# Patient Record
Sex: Female | Born: 1965 | Race: White | Hispanic: No | Smoking: Never smoker
Health system: Southern US, Community
[De-identification: ages and names within clinical notes are randomized; demographics above are authoritative.]

## PROBLEM LIST (undated history)

## (undated) DIAGNOSIS — E785 Hyperlipidemia, unspecified: Secondary | ICD-10-CM

## (undated) DIAGNOSIS — M25471 Effusion, right ankle: Secondary | ICD-10-CM

## (undated) DIAGNOSIS — K589 Irritable bowel syndrome without diarrhea: Secondary | ICD-10-CM

## (undated) DIAGNOSIS — F32A Depression, unspecified: Secondary | ICD-10-CM

## (undated) DIAGNOSIS — F988 Other specified behavioral and emotional disorders with onset usually occurring in childhood and adolescence: Secondary | ICD-10-CM

## (undated) DIAGNOSIS — M1990 Unspecified osteoarthritis, unspecified site: Secondary | ICD-10-CM

## (undated) DIAGNOSIS — T7840XA Allergy, unspecified, initial encounter: Secondary | ICD-10-CM

## (undated) DIAGNOSIS — K439 Ventral hernia without obstruction or gangrene: Secondary | ICD-10-CM

## (undated) DIAGNOSIS — F329 Major depressive disorder, single episode, unspecified: Secondary | ICD-10-CM

## (undated) DIAGNOSIS — R202 Paresthesia of skin: Secondary | ICD-10-CM

## (undated) DIAGNOSIS — M25472 Effusion, left ankle: Secondary | ICD-10-CM

## (undated) DIAGNOSIS — R200 Anesthesia of skin: Secondary | ICD-10-CM

## (undated) DIAGNOSIS — G479 Sleep disorder, unspecified: Secondary | ICD-10-CM

## (undated) DIAGNOSIS — E079 Disorder of thyroid, unspecified: Secondary | ICD-10-CM

## (undated) DIAGNOSIS — I10 Essential (primary) hypertension: Secondary | ICD-10-CM

## (undated) DIAGNOSIS — E669 Obesity, unspecified: Secondary | ICD-10-CM

## (undated) HISTORY — DX: Hyperlipidemia, unspecified: E78.5

## (undated) HISTORY — DX: Other specified behavioral and emotional disorders with onset usually occurring in childhood and adolescence: F98.8

## (undated) HISTORY — DX: Obesity, unspecified: E66.9

## (undated) HISTORY — PX: WISDOM TOOTH EXTRACTION: SHX21

## (undated) HISTORY — DX: Allergy, unspecified, initial encounter: T78.40XA

## (undated) HISTORY — DX: Disorder of thyroid, unspecified: E07.9

## (undated) HISTORY — DX: Fibromyalgia: M79.7

## (undated) HISTORY — DX: Ventral hernia without obstruction or gangrene: K43.9

## (undated) HISTORY — DX: Essential (primary) hypertension: I10

## (undated) HISTORY — DX: Gastro-esophageal reflux disease without esophagitis: K21.9

## (undated) HISTORY — PX: TONSILECTOMY/ADENOIDECTOMY WITH MYRINGOTOMY: SHX6125

## (undated) HISTORY — DX: Unspecified osteoarthritis, unspecified site: M19.90

---

## 1998-07-28 ENCOUNTER — Encounter: Payer: Self-pay | Admitting: *Deleted

## 1998-07-28 ENCOUNTER — Ambulatory Visit (HOSPITAL_COMMUNITY): Admission: RE | Admit: 1998-07-28 | Discharge: 1998-07-28 | Payer: Self-pay | Admitting: *Deleted

## 1998-08-07 ENCOUNTER — Encounter: Payer: Self-pay | Admitting: *Deleted

## 1998-08-07 ENCOUNTER — Ambulatory Visit (HOSPITAL_COMMUNITY): Admission: RE | Admit: 1998-08-07 | Discharge: 1998-08-07 | Payer: Self-pay | Admitting: *Deleted

## 1999-02-14 ENCOUNTER — Ambulatory Visit (HOSPITAL_COMMUNITY): Admission: RE | Admit: 1999-02-14 | Discharge: 1999-02-14 | Payer: Self-pay | Admitting: *Deleted

## 1999-02-14 ENCOUNTER — Encounter: Payer: Self-pay | Admitting: *Deleted

## 1999-04-05 ENCOUNTER — Emergency Department (HOSPITAL_COMMUNITY): Admission: EM | Admit: 1999-04-05 | Discharge: 1999-04-05 | Payer: Self-pay | Admitting: Emergency Medicine

## 1999-04-28 ENCOUNTER — Encounter: Payer: Self-pay | Admitting: Internal Medicine

## 1999-04-28 ENCOUNTER — Ambulatory Visit (HOSPITAL_COMMUNITY): Admission: RE | Admit: 1999-04-28 | Discharge: 1999-04-28 | Payer: Self-pay | Admitting: Internal Medicine

## 1999-07-24 ENCOUNTER — Other Ambulatory Visit: Admission: RE | Admit: 1999-07-24 | Discharge: 1999-07-24 | Payer: Self-pay | Admitting: Obstetrics and Gynecology

## 1999-11-13 ENCOUNTER — Ambulatory Visit (HOSPITAL_COMMUNITY): Admission: RE | Admit: 1999-11-13 | Discharge: 1999-11-13 | Payer: Self-pay

## 2000-11-30 ENCOUNTER — Other Ambulatory Visit: Admission: RE | Admit: 2000-11-30 | Discharge: 2000-11-30 | Payer: Self-pay | Admitting: Obstetrics and Gynecology

## 2002-01-18 ENCOUNTER — Other Ambulatory Visit: Admission: RE | Admit: 2002-01-18 | Discharge: 2002-01-18 | Payer: Self-pay | Admitting: Obstetrics and Gynecology

## 2002-02-14 ENCOUNTER — Encounter: Payer: Self-pay | Admitting: Obstetrics and Gynecology

## 2002-02-14 ENCOUNTER — Ambulatory Visit (HOSPITAL_COMMUNITY): Admission: RE | Admit: 2002-02-14 | Discharge: 2002-02-14 | Payer: Self-pay | Admitting: Obstetrics and Gynecology

## 2006-01-14 ENCOUNTER — Ambulatory Visit (HOSPITAL_COMMUNITY): Admission: RE | Admit: 2006-01-14 | Discharge: 2006-01-14 | Payer: Self-pay | Admitting: Family Medicine

## 2006-01-27 ENCOUNTER — Encounter: Admission: RE | Admit: 2006-01-27 | Discharge: 2006-01-27 | Payer: Self-pay | Admitting: Family Medicine

## 2006-02-11 ENCOUNTER — Encounter: Admission: RE | Admit: 2006-02-11 | Discharge: 2006-02-11 | Payer: Self-pay | Admitting: Family Medicine

## 2006-03-04 ENCOUNTER — Encounter: Admission: RE | Admit: 2006-03-04 | Discharge: 2006-03-04 | Payer: Self-pay | Admitting: Family Medicine

## 2010-08-11 LAB — HM PAP SMEAR: HM Pap smear: NORMAL

## 2010-11-01 ENCOUNTER — Encounter: Payer: Self-pay | Admitting: Family Medicine

## 2011-11-16 ENCOUNTER — Other Ambulatory Visit: Payer: Self-pay | Admitting: Internal Medicine

## 2011-11-16 MED ORDER — TRAMADOL HCL 50 MG PO TABS: 50.0000 mg | ORAL_TABLET | Freq: Three times a day (TID) | ORAL | Status: AC | PRN

## 2011-11-29 ENCOUNTER — Telehealth: Payer: Self-pay

## 2011-11-29 NOTE — Telephone Encounter (Signed)
PT WOULD LIKE TO DISCUSS CHANGES MADE TO HER RX'S WITHOUT HER KNOWLEDGE

## 2011-11-30 MED ORDER — CARISOPRODOL 350 MG PO TABS: 350.0000 mg | ORAL_TABLET | Freq: Four times a day (QID) | ORAL | Status: AC | PRN

## 2011-11-30 MED ORDER — TRAMADOL HCL 50 MG PO TABS: 50.0000 mg | ORAL_TABLET | Freq: Three times a day (TID) | ORAL | Status: AC | PRN

## 2011-11-30 NOTE — Telephone Encounter (Signed)
Pt reports she has taken both Soma and Ultram QID for a long time, but when they were RFd early Feb, Soma was written BID and only received #60. Ultram was written TID and she only received #90. Pt has scheduled appt with Dr Hal Hope for March 11, but would like [place] to correct her Rxs so that she can have RFs until then. Looks like we only gave pt #60 of Soma 2/4 due to her needing OV, and Ultram was written for TID in Epic but with #120. Called pharmacy to verify what pt received. Pt only received #90 of Ultram because insurance limited her to that with sig Q 8 hrs. Pt did only receive #60 of Soma. Please notify pt at [phone] after done.

## 2011-11-30 NOTE — Telephone Encounter (Signed)
LMOM THAT RX SENT IN FOR SOMA

## 2011-11-30 NOTE — Telephone Encounter (Signed)
I am unsure about the ultram but the pharmacy requested #120 that is why that was done.  The Tresa Garter was cut in half because the pt needed an ov.  So the pt should be ok with 1 RF of both the ultram for 3/5 and Soma until her appt on 3/11.  They were sent to pharmacy. Soma written and signed at [employer]

## 2011-11-30 NOTE — Telephone Encounter (Signed)
Called pt to discuss meds. LMOM to CB

## 2011-12-18 ENCOUNTER — Encounter: Payer: Self-pay | Admitting: Family Medicine

## 2011-12-20 ENCOUNTER — Ambulatory Visit (INDEPENDENT_AMBULATORY_CARE_PROVIDER_SITE_OTHER): Payer: 59 | Admitting: Family Medicine

## 2011-12-20 ENCOUNTER — Encounter: Payer: Self-pay | Admitting: Family Medicine

## 2011-12-20 VITALS — BP 146/81 | HR 105 | Temp 97.6°F | Resp 16 | Ht 63.0 in | Wt 274.6 lb

## 2011-12-20 DIAGNOSIS — J9801 Acute bronchospasm: Secondary | ICD-10-CM

## 2011-12-20 DIAGNOSIS — J019 Acute sinusitis, unspecified: Secondary | ICD-10-CM

## 2011-12-20 DIAGNOSIS — IMO0001 Reserved for inherently not codable concepts without codable children: Secondary | ICD-10-CM

## 2011-12-20 DIAGNOSIS — M797 Fibromyalgia: Secondary | ICD-10-CM

## 2011-12-20 DIAGNOSIS — G8929 Other chronic pain: Secondary | ICD-10-CM | POA: Insufficient documentation

## 2011-12-20 DIAGNOSIS — J329 Chronic sinusitis, unspecified: Secondary | ICD-10-CM

## 2011-12-20 DIAGNOSIS — J45909 Unspecified asthma, uncomplicated: Secondary | ICD-10-CM | POA: Insufficient documentation

## 2011-12-20 LAB — GLUCOSE, POCT (MANUAL RESULT ENTRY): POC Glucose: 95

## 2011-12-20 MED ORDER — LEVOFLOXACIN 500 MG PO TABS: 500.0000 mg | ORAL_TABLET | Freq: Every day | ORAL | Status: AC

## 2011-12-20 MED ORDER — TRAMADOL HCL 50 MG PO TABS: 50.0000 mg | ORAL_TABLET | Freq: Four times a day (QID) | ORAL | Status: DC

## 2011-12-20 MED ORDER — CARISOPRODOL 350 MG PO TABS: 350.0000 mg | ORAL_TABLET | Freq: Four times a day (QID) | ORAL | Status: DC

## 2011-12-20 MED ORDER — PREDNISONE 20 MG PO TABS: 20.0000 mg | ORAL_TABLET | Freq: Every day | ORAL | Status: AC

## 2011-12-20 NOTE — Progress Notes (Signed)
  Subjective:    Patient ID: Gloria Carter, female    DOB: [birth year], 46 y.o.   MRN: [id number]  HPI  Patient presents in follow up requesting medication refills  Patient complains of persistant URI symptoms, post nasal drainage and cough.  "My voice keeps going in and out" Cough worse with laughter and productive of sputum clear to brown Husband and patient have ben sick on and off for 3 weeks.  Drinking lots of water and using Mucinex. Using Flovent 220 mcg 2 puffs daily            Serevent 50 mcg BID (Had Advair in the past but did not feel it was as effective so previous MD separated the two out)  Also using albuterol 2 or 3 times a day  Needs refills of Nasonex, prefers over Astelin  Review of Systems     Objective:   Physical Exam  Constitutional: She appears well-developed and well-nourished.  HENT:  Right Ear: Tympanic membrane normal.  Left Ear: Tympanic membrane normal.  Nose: Mucosal edema and rhinorrhea (yellow) present.  Mouth/Throat: Posterior oropharyngeal erythema present.  Neck: Neck supple.  Cardiovascular: Normal rate, regular rhythm and normal heart sounds.   Pulmonary/Chest: Effort normal and breath sounds normal.  Lymphadenopathy:    She has cervical adenopathy.  Neurological: She is alert.  Skin: Skin is warm.        Assessment & Plan:   1. Sinusitis  levofloxacin (LEVAQUIN) 500 MG tablet  2. RAD (reactive airway disease)  predniSONE (DELTASONE) 20 MG tablet  3. Fibromyalgia  carisoprodol (SOMA) 350 MG tablet, traMADol (ULTRAM) 50 MG tablet, POCT glucose (manual entry)  4. Chronic pain  carisoprodol (SOMA) 350 MG tablet, traMADol (ULTRAM) 50 MG tablet   Anticipatory guidance

## 2012-01-04 ENCOUNTER — Other Ambulatory Visit: Payer: Self-pay | Admitting: Physician Assistant

## 2012-01-17 ENCOUNTER — Ambulatory Visit: Payer: 59 | Admitting: Family Medicine

## 2012-01-28 ENCOUNTER — Other Ambulatory Visit: Payer: Self-pay | Admitting: Family Medicine

## 2012-02-07 ENCOUNTER — Ambulatory Visit (INDEPENDENT_AMBULATORY_CARE_PROVIDER_SITE_OTHER): Payer: 59 | Admitting: Family Medicine

## 2012-02-07 ENCOUNTER — Encounter: Payer: Self-pay | Admitting: Family Medicine

## 2012-02-07 VITALS — BP 144/102 | HR 124 | Temp 97.7°F | Resp 16 | Ht 63.0 in | Wt 274.2 lb

## 2012-02-07 DIAGNOSIS — M797 Fibromyalgia: Secondary | ICD-10-CM

## 2012-02-07 DIAGNOSIS — G43909 Migraine, unspecified, not intractable, without status migrainosus: Secondary | ICD-10-CM | POA: Insufficient documentation

## 2012-02-07 DIAGNOSIS — K219 Gastro-esophageal reflux disease without esophagitis: Secondary | ICD-10-CM | POA: Insufficient documentation

## 2012-02-07 DIAGNOSIS — F988 Other specified behavioral and emotional disorders with onset usually occurring in childhood and adolescence: Secondary | ICD-10-CM

## 2012-02-07 DIAGNOSIS — G8929 Other chronic pain: Secondary | ICD-10-CM

## 2012-02-07 DIAGNOSIS — J45909 Unspecified asthma, uncomplicated: Secondary | ICD-10-CM

## 2012-02-07 DIAGNOSIS — IMO0001 Reserved for inherently not codable concepts without codable children: Secondary | ICD-10-CM

## 2012-02-07 DIAGNOSIS — K439 Ventral hernia without obstruction or gangrene: Secondary | ICD-10-CM

## 2012-02-07 DIAGNOSIS — R1310 Dysphagia, unspecified: Secondary | ICD-10-CM | POA: Insufficient documentation

## 2012-02-07 LAB — POCT CBC
Granulocyte percent: 65.9 %G (ref 37–80)
HCT, POC: 40.2 % (ref 37.7–47.9)
MCV: 88.9 fL (ref 80–97)
POC Granulocyte: 8.8 — AB (ref 2–6.9)
RBC: 4.52 M/uL (ref 4.04–5.48)
RDW, POC: 13.7 %

## 2012-02-07 MED ORDER — IPRATROPIUM BROMIDE 0.02 % IN SOLN
0.5000 mg | Freq: Once | RESPIRATORY_TRACT | Status: AC
  Administered 2012-02-07: 0.5 mg via RESPIRATORY_TRACT

## 2012-02-07 MED ORDER — ALBUTEROL SULFATE (2.5 MG/3ML) 0.083% IN NEBU
2.5000 mg | INHALATION_SOLUTION | Freq: Once | RESPIRATORY_TRACT | Status: AC
  Administered 2012-02-07: 2.5 mg via RESPIRATORY_TRACT

## 2012-02-07 MED ORDER — MOMETASONE FURO-FORMOTEROL FUM 200-5 MCG/ACT IN AERO: 2.0000 | INHALATION_SPRAY | Freq: Two times a day (BID) | RESPIRATORY_TRACT | Status: DC

## 2012-02-07 MED ORDER — ALBUTEROL SULFATE HFA 108 (90 BASE) MCG/ACT IN AERS: 2.0000 | INHALATION_SPRAY | Freq: Four times a day (QID) | RESPIRATORY_TRACT | Status: DC | PRN

## 2012-02-07 MED ORDER — HYDROCODONE-HOMATROPINE 5-1.5 MG/5ML PO SYRP: 5.0000 mL | ORAL_SOLUTION | Freq: Three times a day (TID) | ORAL | Status: AC | PRN

## 2012-02-07 NOTE — Progress Notes (Signed)
  Subjective:    Patient ID: Gloria Carter, female    DOB: Oct 01, 1966, 46 y.o.   MRN: [id number]  HPI  Patient presents in follow up of her multiple medical problems  1) Cough persists- using inhaler with occasional missed dosages; believes developed abdominal                                  hernia secondary to persistant cough; requesting Z pack;  2) Dysphagia- requests referral to Dr. Christella Hartigan; occasionally difficulty with swallowing pills  3) Sciatic worsening; feels more numb lateral aspect of (L) leg   Review of Systems  Constitutional: Negative for chills and fatigue.  HENT: Positive for congestion, rhinorrhea, sneezing and postnasal drip.   Respiratory: Positive for cough and wheezing.        Objective:   Physical Exam  Constitutional: She appears well-developed.  HENT:  Head: Normocephalic and atraumatic.  Neck: Neck supple. No thyromegaly present.  Cardiovascular: Normal rate, regular rhythm and normal heart sounds.   Pulmonary/Chest: She has wheezes (scattered wheezes posteriorly).  Abdominal: Soft. She exhibits mass (abdominal hernia). A hernia is present.    Neurological: She is alert.  Skin: Skin is warm.    After nebulizer treatment air movement much improved without expiratory wheezes.    Assessment & Plan:   1. Asthma exacerbation, allergic  albuterol (PROVENTIL) (2.5 MG/3ML) 0.083% nebulizer solution 2.5 mg, ipratropium (ATROVENT) nebulizer solution 0.5 mg, POCT CBC, HYDROcodone-homatropine (HYCODAN) 5-1.5 MG/5ML syrup, Mometasone Furo-Formoterol Fum 200-5 MCG/ACT AERO, albuterol (PROVENTIL HFA;VENTOLIN HFA) 108 (90 BASE) MCG/ACT inhaler  2. Hernia of abdominal wall    3. Dysphagia  Ambulatory referral to Gastroenterology  4. Fibromyalgia    5. Chronic pain    6. Migraine    7. GERD (gastroesophageal reflux disease)  Ambulatory referral to Gastroenterology  8. ADD (attention deficit disorder)      Anticipatory guidance

## 2012-02-08 ENCOUNTER — Encounter: Payer: Self-pay | Admitting: Internal Medicine

## 2012-02-10 ENCOUNTER — Ambulatory Visit: Payer: Self-pay | Admitting: Internal Medicine

## 2012-02-14 ENCOUNTER — Other Ambulatory Visit: Payer: Self-pay | Admitting: Family Medicine

## 2012-03-01 ENCOUNTER — Other Ambulatory Visit: Payer: Self-pay | Admitting: Physician Assistant

## 2012-03-22 ENCOUNTER — Other Ambulatory Visit: Payer: Self-pay | Admitting: Family Medicine

## 2012-03-25 ENCOUNTER — Other Ambulatory Visit: Payer: Self-pay | Admitting: Physician Assistant

## 2012-03-26 ENCOUNTER — Other Ambulatory Visit: Payer: Self-pay | Admitting: *Deleted

## 2012-03-26 DIAGNOSIS — M797 Fibromyalgia: Secondary | ICD-10-CM

## 2012-03-26 DIAGNOSIS — G8929 Other chronic pain: Secondary | ICD-10-CM

## 2012-03-26 MED ORDER — CARISOPRODOL 350 MG PO TABS: 350.0000 mg | ORAL_TABLET | Freq: Four times a day (QID) | ORAL | Status: DC

## 2012-03-27 ENCOUNTER — Other Ambulatory Visit: Payer: Self-pay | Admitting: Physician Assistant

## 2012-03-31 ENCOUNTER — Other Ambulatory Visit: Payer: Self-pay | Admitting: Physician Assistant

## 2012-04-02 ENCOUNTER — Other Ambulatory Visit: Payer: Self-pay | Admitting: Physician Assistant

## 2012-04-27 ENCOUNTER — Other Ambulatory Visit: Payer: Self-pay | Admitting: Family Medicine

## 2012-04-27 ENCOUNTER — Other Ambulatory Visit: Payer: Self-pay | Admitting: Physician Assistant

## 2012-04-27 DIAGNOSIS — M797 Fibromyalgia: Secondary | ICD-10-CM

## 2012-04-27 DIAGNOSIS — G8929 Other chronic pain: Secondary | ICD-10-CM

## 2012-04-27 MED ORDER — CARISOPRODOL 350 MG PO TABS: 350.0000 mg | ORAL_TABLET | Freq: Four times a day (QID) | ORAL | Status: DC

## 2012-05-03 ENCOUNTER — Other Ambulatory Visit: Payer: Self-pay | Admitting: Physician Assistant

## 2012-05-22 ENCOUNTER — Other Ambulatory Visit: Payer: Self-pay | Admitting: Family Medicine

## 2012-05-22 NOTE — Telephone Encounter (Signed)
Please pull chart.

## 2012-05-22 NOTE — Telephone Encounter (Signed)
Do we rx this for patient?  Can we?

## 2012-05-23 NOTE — Telephone Encounter (Signed)
Chart pulled [id number]

## 2012-05-31 ENCOUNTER — Other Ambulatory Visit: Payer: Self-pay | Admitting: Physician Assistant

## 2012-06-03 ENCOUNTER — Telehealth: Payer: Self-pay

## 2012-06-03 DIAGNOSIS — G8929 Other chronic pain: Secondary | ICD-10-CM

## 2012-06-03 DIAGNOSIS — M797 Fibromyalgia: Secondary | ICD-10-CM

## 2012-06-03 NOTE — Telephone Encounter (Signed)
Pt is out of SOMA has been out for two days   CBN [phone]

## 2012-06-04 ENCOUNTER — Telehealth: Payer: Self-pay | Admitting: Physician Assistant

## 2012-06-04 MED ORDER — CARISOPRODOL 350 MG PO TABS: 350.0000 mg | ORAL_TABLET | Freq: Four times a day (QID) | ORAL | Status: DC

## 2012-06-04 MED ORDER — PREGABALIN 150 MG PO CAPS: 150.0000 mg | ORAL_CAPSULE | Freq: Three times a day (TID) | ORAL | Status: DC

## 2012-06-04 NOTE — Telephone Encounter (Signed)
rx printed. Fax/call to [facility]

## 2012-06-04 NOTE — Telephone Encounter (Signed)
Done

## 2012-06-04 NOTE — Telephone Encounter (Signed)
rx printed

## 2012-06-04 NOTE — Telephone Encounter (Signed)
Rx faxed in, called pt to let her know.

## 2012-07-01 ENCOUNTER — Other Ambulatory Visit: Payer: Self-pay | Admitting: Physician Assistant

## 2012-07-09 ENCOUNTER — Other Ambulatory Visit: Payer: Self-pay | Admitting: *Deleted

## 2012-07-09 DIAGNOSIS — M797 Fibromyalgia: Secondary | ICD-10-CM

## 2012-07-09 DIAGNOSIS — G8929 Other chronic pain: Secondary | ICD-10-CM

## 2012-07-09 MED ORDER — CARISOPRODOL 350 MG PO TABS: 350.0000 mg | ORAL_TABLET | Freq: Four times a day (QID) | ORAL | Status: DC

## 2012-07-12 ENCOUNTER — Other Ambulatory Visit: Payer: Self-pay | Admitting: Physician Assistant

## 2012-08-13 ENCOUNTER — Ambulatory Visit (INDEPENDENT_AMBULATORY_CARE_PROVIDER_SITE_OTHER): Payer: 59 | Admitting: Internal Medicine

## 2012-08-13 VITALS — BP 170/93 | HR 84 | Temp 97.5°F | Resp 18 | Ht 63.75 in | Wt 282.8 lb

## 2012-08-13 DIAGNOSIS — E039 Hypothyroidism, unspecified: Secondary | ICD-10-CM

## 2012-08-13 DIAGNOSIS — M797 Fibromyalgia: Secondary | ICD-10-CM

## 2012-08-13 DIAGNOSIS — R21 Rash and other nonspecific skin eruption: Secondary | ICD-10-CM

## 2012-08-13 DIAGNOSIS — G43909 Migraine, unspecified, not intractable, without status migrainosus: Secondary | ICD-10-CM

## 2012-08-13 DIAGNOSIS — E785 Hyperlipidemia, unspecified: Secondary | ICD-10-CM

## 2012-08-13 DIAGNOSIS — J40 Bronchitis, not specified as acute or chronic: Secondary | ICD-10-CM

## 2012-08-13 DIAGNOSIS — J45909 Unspecified asthma, uncomplicated: Secondary | ICD-10-CM

## 2012-08-13 DIAGNOSIS — K589 Irritable bowel syndrome without diarrhea: Secondary | ICD-10-CM

## 2012-08-13 DIAGNOSIS — K219 Gastro-esophageal reflux disease without esophagitis: Secondary | ICD-10-CM

## 2012-08-13 DIAGNOSIS — Z6841 Body Mass Index (BMI) 40.0 and over, adult: Secondary | ICD-10-CM

## 2012-08-13 DIAGNOSIS — F988 Other specified behavioral and emotional disorders with onset usually occurring in childhood and adolescence: Secondary | ICD-10-CM

## 2012-08-13 DIAGNOSIS — G8929 Other chronic pain: Secondary | ICD-10-CM

## 2012-08-13 DIAGNOSIS — I10 Essential (primary) hypertension: Secondary | ICD-10-CM | POA: Insufficient documentation

## 2012-08-13 DIAGNOSIS — N3010 Interstitial cystitis (chronic) without hematuria: Secondary | ICD-10-CM

## 2012-08-13 LAB — COMPREHENSIVE METABOLIC PANEL
ALT: 25 U/L (ref 0–35)
Albumin: 4.1 g/dL (ref 3.5–5.2)
CO2: 32 mEq/L (ref 19–32)
Calcium: 9.1 mg/dL (ref 8.4–10.5)
Chloride: 98 mEq/L (ref 96–112)
Sodium: 138 mEq/L (ref 135–145)
Total Protein: 7 g/dL (ref 6.0–8.3)

## 2012-08-13 LAB — POCT CBC
Hemoglobin: 12.4 g/dL (ref 12.2–16.2)
MCH, POC: 27.8 pg (ref 27–31.2)
MCV: 90 fL (ref 80–97)
MPV: 9.8 fL (ref 0–99.8)
POC MID %: 6.9 %M (ref 0–12)
RBC: 4.46 M/uL (ref 4.04–5.48)
WBC: 9 10*3/uL (ref 4.6–10.2)

## 2012-08-13 LAB — LIPID PANEL: LDL Cholesterol: 160 mg/dL — ABNORMAL HIGH (ref 0–99)

## 2012-08-13 MED ORDER — TRAMADOL HCL 50 MG PO TABS: 50.0000 mg | ORAL_TABLET | Freq: Four times a day (QID) | ORAL | Status: DC

## 2012-08-13 MED ORDER — AMPHETAMINE-DEXTROAMPHETAMINE 7.5 MG PO TABS: 7.5000 mg | ORAL_TABLET | Freq: Every day | ORAL | Status: DC

## 2012-08-13 MED ORDER — MONTELUKAST SODIUM 10 MG PO TABS
10.0000 mg | ORAL_TABLET | Freq: Every day | ORAL | Status: DC
Start: 2012-08-13 — End: 2013-03-02

## 2012-08-13 MED ORDER — SYNTHROID 50 MCG PO TABS: 50.0000 ug | ORAL_TABLET | Freq: Every day | ORAL | Status: DC

## 2012-08-13 MED ORDER — RANITIDINE HCL 150 MG PO TABS
150.0000 mg | ORAL_TABLET | Freq: Two times a day (BID) | ORAL | Status: DC
Start: 2012-08-13 — End: 2013-08-31

## 2012-08-13 MED ORDER — HYOSCYAMINE SULFATE ER 0.375 MG PO TB12: 0.3750 mg | ORAL_TABLET | Freq: Two times a day (BID) | ORAL | Status: DC | PRN

## 2012-08-13 MED ORDER — CITALOPRAM HYDROBROMIDE 10 MG PO TABS: 10.0000 mg | ORAL_TABLET | Freq: Every day | ORAL | Status: DC

## 2012-08-13 MED ORDER — PREGABALIN 150 MG PO CAPS: 150.0000 mg | ORAL_CAPSULE | Freq: Three times a day (TID) | ORAL | Status: DC

## 2012-08-13 MED ORDER — ESOMEPRAZOLE MAGNESIUM 40 MG PO CPDR: 40.0000 mg | DELAYED_RELEASE_CAPSULE | Freq: Every day | ORAL | Status: DC

## 2012-08-13 MED ORDER — FLUTICASONE-SALMETEROL 100-50 MCG/DOSE IN AEPB: 1.0000 | INHALATION_SPRAY | Freq: Two times a day (BID) | RESPIRATORY_TRACT | Status: DC

## 2012-08-13 MED ORDER — TELMISARTAN-HCTZ 40-12.5 MG PO TABS: 1.0000 | ORAL_TABLET | Freq: Every day | ORAL | Status: DC

## 2012-08-13 MED ORDER — CARISOPRODOL 350 MG PO TABS: 350.0000 mg | ORAL_TABLET | Freq: Four times a day (QID) | ORAL | Status: DC

## 2012-08-13 MED ORDER — AZITHROMYCIN 250 MG PO TABS: ORAL_TABLET | ORAL | Status: DC

## 2012-08-13 MED ORDER — SUMATRIPTAN SUCCINATE 100 MG PO TABS: 100.0000 mg | ORAL_TABLET | ORAL | Status: DC | PRN

## 2012-08-13 MED ORDER — HYOSCYAMINE 0.15 MG PO TABS: 0.1500 mg | ORAL_TABLET | ORAL | Status: DC | PRN

## 2012-08-13 NOTE — Progress Notes (Addendum)
Subjective:    Patient ID: Gloria Carter, female    DOB: 23-Sep-1966, 46 y.o.   MRN: [id number]  HPIf/u reg f/u w/ labs /meds  ExceptGYN Also  - thumb pain-nail avuls yesterday  -Sinus sxt w/ cough occas prod,sneezing,S intermitt///worse in AM for 4 days-fever only one  day  -Nausea w/out vomiting-no cahnge in BMs  Tried Muc w/out success  Past Medical History  Diagnosis Date  . Asthma   . Dysphagia   . Fibromyalgia-pain meds working well--branded only   . Migraine-hormones from OB help   . GERD (gastroesophageal reflux disease)   . ADD (attention deficit disorder)--meds not working well/feels sleep disturbed-takes XR in AM-missing meds and falling behind in dictation at work   . Hernia of abdominal wall-no complaints today        Review of Systems  Constitutional: Negative for activity change and unexpected weight change.  HENT: Negative for nosebleeds.        Perf nasal septum  Eyes: Negative for photophobia and visual disturbance.  Respiratory: Negative for chest tightness, shortness of breath and wheezing.   Cardiovascular: Positive for chest pain. Negative for palpitations and leg swelling.       Pain w/ cough  Genitourinary: Negative for vaginal bleeding.       Lmp 3 yr ago Hot flashes 1-2 yr  Musculoskeletal: Positive for myalgias.  Skin: Positive for rash.       Bumps on arms and legs  Neurological: Positive for headaches.  Psychiatric/Behavioral: Positive for sleep disturbance.                                           No past surgical history on file.  Prior to Admission medications   Medication Sig Start Date End Date Taking? Authorizing Provider  albuterol (PROVENTIL HFA;VENTOLIN HFA) 108 (90 BASE) MCG/ACT inhaler Inhale 2 puffs into the lungs every 6 (six) hours as needed. 02/07/12  Yes Dois Davenport, MD  amphetamine-dextroamphetamine (ADDERALL XR) 15 MG 24 hr capsule Take 15 mg by mouth every morning.   Yes Historical Provider, MD    Ascorbic Acid (VITAMIN C) 100 MG tablet Take 100 mg by mouth daily.   Yes Historical Provider, MD  ASTEPRO 0.15 % SOLN USE ONE OR TWO SPRAYS IN EACH NOSTRIL TWICE DAILY 03/01/12  Yes Ryan M Dunn, PA-C  B Complex-C-Folic Acid (B-COMPLEX BALANCED PO) Take by mouth 1 day or 1 dose.   Yes Historical Provider, MD  calcium carbonate 200 MG capsule Take 250 mg by mouth 2 (two) times daily with a meal.   Yes Historical Provider, MD  carisoprodol (SOMA) 350 MG tablet Take 1 tablet (350 mg total) by mouth 4 (four) times daily. 07/09/12  Yes Ryan M Dunn, PA-C  cetirizine (ZYRTEC) 10 MG tablet Take 10 mg by mouth as needed.   Yes Historical Provider, MD  cholecalciferol (VITAMIN D) 400 UNITS TABS Take 400 Units by mouth daily.   Yes Historical Provider, MD  citalopram (CELEXA) 10 MG tablet TAKE ONE TABLET BY MOUTH ONE TIME DAILY 03/22/12  Yes Marzella Schlein McClung, PA-C  esomeprazole (NEXIUM) 40 MG capsule Take 40 mg by mouth daily.   Yes Historical Provider, MD  FLOVENT HFA 220 MCG/ACT inhaler INHALE ONE PUFF BY MOUTH TWICE DAILY 03/27/12  Yes Ryan M Dunn, PA-C  guaiFENesin (MUCINEX) 600 MG 12 hr tablet Take 1,200 mg by mouth  as needed.   Yes Historical Provider, MD  hyoscyamine (LEVBID) 0.375 MG 12 hr tablet TAKE ONE TABLET BY MOUTH TWICE DAILY AS NEEDED 05/22/12  Yes Ryan M Dunn, PA-C  ibuprofen (ADVIL,MOTRIN) 200 MG tablet Take 200 mg by mouth as needed.   Yes Historical Provider, MD  MICARDIS HCT 40-12.5 MG per tablet TAKE ONE TABLET BY MOUTH ONE TIME DAILY 05/03/12  Yes Anders Simmonds, PA-C  Multiple Vitamin (MULTIVITAMIN) tablet Take 1 tablet by mouth daily.   Yes Historical Provider, MD  norethindrone (MICRONOR,CAMILA,ERRIN) 0.35 MG tablet Take 1 tablet by mouth daily.   Yes Historical Provider, MD  pregabalin (LYRICA) 150 MG capsule Take 1 capsule (150 mg total) by mouth 3 (three) times daily. 06/04/12  Yes Chelle S Jeffery, PA-C  ranitidine (ZANTAC) 150 MG tablet Take 150 mg by mouth as needed.   Yes  Historical Provider, MD  SINGULAIR 10 MG tablet TAKE ONE TABLET BY MOUTH ONE TIME DAILY 04/02/12  Yes Chelle S Jeffery, PA-C  SUMAtriptan (IMITREX) 100 MG tablet TAKE 1 TABLET BY MOUTH MAY REPEAT IN 2 HOURS IF HEADACHE PERSISTS 07/12/12  Yes Ryan M Dunn, PA-C  SYNTHROID 50 MCG tablet TAKE ONE TABLET BY MOUTH ONE TIME DAILY 03/31/12  Yes Chelle S Jeffery, PA-C  traMADol (ULTRAM) 50 MG tablet Take 1 tablet (50 mg total) by mouth 4 (four) times daily. Needs office visit for more 07/01/12  Yes Heather M Marte, PA-C  azelastine (ASTELIN) 137 MCG/SPRAY nasal spray Place 2 sprays into the nose 2 (two) times daily. Use in each nostril as directed    Historical Provider, MD  calcium carbonate (OS-CAL) 600 MG TABS Take 600 mg by mouth daily.    Historical Provider, MD  fish oil-omega-3 fatty acids 1000 MG capsule Take 1 g by mouth daily.    Historical Provider, MD  hyoscyamine (CYSTOSPAZ) 0.15 MG tablet Take 0.15 mg by mouth every 4 (four) hours as needed.    Historical Provider, MD  mometasone (NASONEX) 50 MCG/ACT nasal spray Place 2 sprays into the nose daily.    Historical Provider, MD  Mometasone Furo-Formoterol Fum 200-5 MCG/ACT AERO Inhale 2 puffs into the lungs 2 (two) times daily. 02/07/12   Dois Davenport, MD  Red Yeast Rice 600 MG CAPS Take 600 mg by mouth daily.    Historical Provider, MD    Allergies  Allergen Reactions  . Codeine Nausea Only    Can tolerate hydromet and tussionex  . Sulfa Antibiotics Nausea And Vomiting    History   Social History  . Marital Status: Married    Spouse Name: N/A    Number of Children: N/A  . Years of Education: N/A   Occupational History  . Works w/[employer]ations   Social History Main Topics  . Smoking status: Never Smoker   . Smokeless tobacco: Not on file  . Alcohol Use: No  . Drug Use: No  . Sexually Active: Not on file   Other Topics Concern  . Not on file   Social History Narrative  . No narrative on file    Family History  Problem  Relation Age of Onset  . Colon cancer    -hyperlipidemia -Skin rashes?    Objective:   Physical Exam General: 46 yo overweight F presents w/husband and is pleasant and cooperative w/exam. Vitals: BMI = 48.? Filed Vitals:   08/13/12 1048  BP: 170/93  Pulse: 84  Temp: 97.5 F (36.4 C)  Resp: 18  HEENT: Nontraumatic, EOMIT,  Sclera clear, TM w/ scarring and w/o exudates, nasal passages w/blood and mucous, +perf septum-stable, pharynx clear, No LN, trachea midline Heart: Distant heart sounds, RRR Lung: Distant lung sounds, no wheezing appreciated even FE/rhonchi that cleared w/ deep br Abdomen: Round, soft, tender over umbilical hernia, otherwise NT, no masses appreciated MSK: Normal tone Neuro: Alert, oriented, CN II - XII grossly IT Skin: erythematous base papular rash w/scaling on arms bilaterally, erythematous discoloration across malar distrib, erythema of auricles/tender to touch  Nails w/ vert white lines of unclear etio Labs: Results for orders placed in visit on 08/13/12  POCT CBC      Component Value Range   WBC 9.0  4.6 - 10.2 K/uL   Lymph, poc 3.4  0.6 - 3.4   POC LYMPH PERCENT 37.8  10 - 50 %L   MID (cbc) 0.6  0 - 0.9   POC MID % 6.9  0 - 12 %M   POC Granulocyte 5.0  2 - 6.9   Granulocyte percent 55.3  37 - 80 %G   RBC 4.46  4.04 - 5.48 M/uL   Hemoglobin 12.4  12.2 - 16.2 g/dL   HCT, POC 96.0  45.4 - 47.9 %   MCV 90.0  80 - 97 fL   MCH, POC 27.8  27 - 31.2 pg   MCHC 30.9 (*) 31.8 - 35.4 g/dL   RDW, POC 09.8     Platelet Count, POC 238  142 - 424 K/uL   MPV 9.8  0 - 99.8 fL         Assessment & Plan:  Assessment: 1. HTN (hypertension)  Comprehensive metabolic panel, POCT CBC, telmisartan-hydrochlorothiazide (MICARDIS HCT) 40-12.5 MG per tablet  2. BMI 45.0-49.9, adult  TSH, T4, Free  3. Hyperlipidemia  Lipid panel  4. IBS (irritable bowel syndrome)  hyoscyamine (LEVBID) 0.375 MG 12 hr tablet  5. Malar rash  ANA  6. Fibromyalgia  pregabalin (LYRICA) 150  MG capsule, carisoprodol (SOMA) 350 MG tablet, traMADol (ULTRAM) 50 MG tablet, citalopram (CELEXA) 10 MG tablet  7. Chronic pain  carisoprodol (SOMA) 350 MG tablet  8. ADD (attention deficit disorder)  amphetamine-dextroamphetamine (ADDERALL) 7.5 MG tablet---to call w/ results of dose change and may fill by phone if OK  9. GERD (gastroesophageal reflux disease)  ranitidine (ZANTAC) 150 MG tablet, esomeprazole (NEXIUM) 40 MG capsule  10. Asthma  montelukast (SINGULAIR) 10 MG tablet, Fluticasone-Salmeterol (ADVAIR) 100-50 MCG/DOSE AEPB  11. Migraine  SUMAtriptan (IMITREX) 100 MG tablet  12. Hypothyroid  SYNTHROID 50 MCG tablet  13. Interstitial cystitis  hyoscyamine (CYSTOSPAZ) 0.15 MG tablet  14. Bronchitis  zitho  15. Keratosis pilaris     Moisturizer!!! 16. Thumb pain due to sprain   Plan: 1) Labs: CBC, CMP, Lipid panel, TSH, T4 free, ANA   Meds ordered this encounter  Medications  . B Complex-C-Folic Acid (B-COMPLEX BALANCED PO)    Sig: Take by mouth 1 day or 1 dose.  Marland Kitchen amphetamine-dextroamphetamine (ADDERALL) 7.5 MG tablet    Sig: Take 1 tablet (7.5 mg total) by mouth daily.    Dispense:  30 tablet    Refill:  0  . pregabalin (LYRICA) 150 MG capsule    Sig: Take 1 capsule (150 mg total) by mouth 3 (three) times daily.    Dispense:  270 capsule    Refill:  3  . telmisartan-hydrochlorothiazide (MICARDIS HCT) 40-12.5 MG per tablet    Sig: Take 1 tablet by mouth daily.    Dispense:  90  tablet    Refill:  3  . hyoscyamine (LEVBID) 0.375 MG 12 hr tablet    Sig: Take 1 tablet (0.375 mg total) by mouth every 12 (twelve) hours as needed for cramping.    Dispense:  60 tablet    Refill:  5  . carisoprodol (SOMA) 350 MG tablet    Sig: Take 1 tablet (350 mg total) by mouth 4 (four) times daily.    Dispense:  120 tablet    Refill:  0  . ranitidine (ZANTAC) 150 MG tablet    Sig: Take 1 tablet (150 mg total) by mouth 2 (two) times daily.    Dispense:  60 tablet    Refill:  11  .  montelukast (SINGULAIR) 10 MG tablet    Sig: Take 1 tablet (10 mg total) by mouth at bedtime.    Dispense:  90 tablet    Refill:  1  . traMADol (ULTRAM) 50 MG tablet    Sig: Take 1 tablet (50 mg total) by mouth 4 (four) times daily. Needs office visit for more    Dispense:  120 tablet    Refill:  5  . esomeprazole (NEXIUM) 40 MG capsule    Sig: Take 1 capsule (40 mg total) by mouth daily.    Dispense:  90 capsule    Refill:  3  . citalopram (CELEXA) 10 MG tablet    Sig: Take 1 tablet (10 mg total) by mouth daily.    Dispense:  90 tablet    Refill:  3  . SUMAtriptan (IMITREX) 100 MG tablet    Sig: Take 1 tablet (100 mg total) by mouth every 2 (two) hours as needed for migraine.    Dispense:  4 tablet    Refill:  11  . SYNTHROID 50 MCG tablet    Sig: Take 1 tablet (50 mcg total) by mouth daily.    Dispense:  90 each    Refill:  3  . hyoscyamine (CYSTOSPAZ) 0.15 MG tablet    Sig: Take 1 tablet (0.15 mg total) by mouth every 4 (four) hours as needed.    Dispense:  60 tablet    Refill:  5  . Fluticasone-Salmeterol (ADVAIR) 100-50 MCG/DOSE AEPB    Sig: Inhale 1 puff into the lungs 2 (two) times daily.    Dispense:  1 each    Refill:  3  . azithromycin (ZITHROMAX) 250 MG tablet    Sig: As packaged    Dispense:  6 tablet    Refill:  0

## 2012-08-15 ENCOUNTER — Telehealth: Payer: Self-pay

## 2012-08-15 ENCOUNTER — Encounter: Payer: Self-pay | Admitting: Internal Medicine

## 2012-08-15 MED ORDER — POTASSIUM CHLORIDE CRYS ER 20 MEQ PO TBCR: 20.0000 meq | EXTENDED_RELEASE_TABLET | Freq: Every day | ORAL | Status: DC

## 2012-08-15 NOTE — Telephone Encounter (Signed)
Before mailing this letter please call her/potassium 2.8 too low  Needs to add KCl 20 meq  #30 one daily with repeat lab in 4 weeks      Dr. Merla Riches. Tried calling both numbers

## 2012-08-15 NOTE — Telephone Encounter (Signed)
Pt notified Rx sent to pharm

## 2012-08-15 NOTE — Progress Notes (Signed)
Note to Dr. Katrinka Blazing to confirm if she will accept this NP. Gloria Carter

## 2012-08-15 NOTE — Telephone Encounter (Signed)
Before mailing this letter please call her/potassium 2.8 too low  Needs to add KCl 20 meq  #30 one daily with repeat lab in 4 weeks      Both phone numbers listed for pt are out of service. Called husband's cell and he will tell her to give [place] a call back. Letter sent. Please verify pharm and send over the KCL. Thanks

## 2012-08-16 NOTE — Progress Notes (Signed)
Left msg for pt to call to schedule CPE with Drs. Clelia Croft or [employer]. Lennon Alstrom

## 2012-08-21 NOTE — Progress Notes (Signed)
Sent pt a reminder letter to schedule CPE with Drs Shaw or [facility]son. JJ 

## 2012-09-10 ENCOUNTER — Other Ambulatory Visit: Payer: Self-pay | Admitting: Internal Medicine

## 2012-09-10 DIAGNOSIS — F988 Other specified behavioral and emotional disorders with onset usually occurring in childhood and adolescence: Secondary | ICD-10-CM

## 2012-09-10 DIAGNOSIS — G8929 Other chronic pain: Secondary | ICD-10-CM

## 2012-09-10 DIAGNOSIS — M797 Fibromyalgia: Secondary | ICD-10-CM

## 2012-09-11 ENCOUNTER — Telehealth: Payer: Self-pay | Admitting: Family Medicine

## 2012-09-11 MED ORDER — AMPHETAMINE-DEXTROAMPHETAMINE 7.5 MG PO TABS: 7.5000 mg | ORAL_TABLET | Freq: Every day | ORAL | Status: DC

## 2012-09-11 MED ORDER — CARISOPRODOL 350 MG PO TABS: 350.0000 mg | ORAL_TABLET | Freq: Four times a day (QID) | ORAL | Status: DC

## 2012-09-11 NOTE — Telephone Encounter (Signed)
Called patient and notified adderall and soma rx ready to be picked up.

## 2012-10-11 ENCOUNTER — Telehealth: Payer: Self-pay | Admitting: *Deleted

## 2012-10-11 NOTE — Telephone Encounter (Signed)
[facility] requesting refill on soma 350mg .  Last refill on 09/12/12.

## 2012-10-11 NOTE — Telephone Encounter (Signed)
Forward to Dr. Doolittle 

## 2012-10-12 NOTE — Telephone Encounter (Signed)
Left message for patient to call back about this.

## 2012-10-12 NOTE — Telephone Encounter (Signed)
Sorry-see ov 11/3 She failed to f/u for recheck K which was 2.8 at OV AND she did not set up appt at 104 for ongoing care with Dr Clelia Croft or Dr Audria Nine as directed She was a patient of Dr Wendee Copp and needs a new PCP

## 2012-10-16 NOTE — Telephone Encounter (Signed)
I have left another message for her to call back.

## 2012-10-17 ENCOUNTER — Telehealth: Payer: Self-pay | Admitting: *Deleted

## 2012-10-17 NOTE — Telephone Encounter (Signed)
Forward to Dr. Doolittle 

## 2012-10-17 NOTE — Telephone Encounter (Signed)
see 10/11/12

## 2012-10-17 NOTE — Telephone Encounter (Signed)
[facility] requesting refill on soma 350mg . Last refilled on 09/12/12

## 2012-10-18 NOTE — Telephone Encounter (Signed)
Pharmacy advised. I have left several messages for patient and she does not return my calls. I have advised pharmacy, they will also contact her.

## 2012-10-18 NOTE — Telephone Encounter (Signed)
The most important thing is the K level and anyone can see her to repeat this(and reorder soma while here) We have enough open hrs to accomodate her work schedule ---this K needs repeating(see letter 11/5)

## 2012-10-18 NOTE — Telephone Encounter (Signed)
Left her a detailed message, this time.

## 2012-10-18 NOTE — Telephone Encounter (Signed)
Called patient again about Gloria Carter

## 2012-10-18 NOTE — Telephone Encounter (Signed)
Notified pt that she needs to RTC for K check and explained the importance and that it can be dangerous for K level to be as low as hers was. Pt stated that she was being blackmailed into RTC. Advised her that Dr Merla Riches just wants to ensure that she is safe by rechecking her for this and anyone can see her and can rewrite the [facility] when she comes in. Reminded pt of our long hours.

## 2012-10-18 NOTE — Telephone Encounter (Signed)
Pt CB and I explained to her that we can not RF Soma bc she did not f/up for K check and appt to est care w/Dr Clelia Croft or Dr Audria Nine. She stated that she was not aware that she had to make appt right away, she was under the impression that she needed to make an appt in 6 mos. Pt was upset that she has been out of Soma for several days and it has taken so long to get back to her. I advised pt that we have left several messages and she has not returned our calls. Pt wanted to know on what # messages were left, and I told her that it would have been on her cell and/or H #s. Pt stated that she has told us to contact her on her mobile and stated that she has not gotten any messages. I went over details of all of the messages Amy had left (10/11/12 message also) and she stated that "just because someone writes that they do something doesn't mean that she did it". I advised her that Amy does not lie in her documentation and that I am sorry if she has not gotten our messages, but that we had left them. I checked w/Amy afterward, and she stated she LM on both #s. I gave pt option to come to 102 and see a provider and advised her that Dr Clelia Croft will be in this afternoon until closing until 8:30. She stated she will probably not even leave work until 7:00 and she is out of her Tresa Garter and wants [place] to RF until she can get in. I advised her that I would be happy to let Dr Merla Riches know that she wasn't aware that she had to have appt before next RF and ask him again for her.   Dr. Merla Riches, please advise.

## 2012-11-25 ENCOUNTER — Other Ambulatory Visit: Payer: Self-pay

## 2012-12-01 ENCOUNTER — Ambulatory Visit (INDEPENDENT_AMBULATORY_CARE_PROVIDER_SITE_OTHER): Payer: 59 | Admitting: Family Medicine

## 2012-12-01 ENCOUNTER — Encounter: Payer: Self-pay | Admitting: Family Medicine

## 2012-12-01 VITALS — BP 154/88 | HR 99 | Temp 98.0°F | Resp 18 | Ht 63.5 in | Wt 278.0 lb

## 2012-12-01 DIAGNOSIS — M797 Fibromyalgia: Secondary | ICD-10-CM

## 2012-12-01 DIAGNOSIS — I10 Essential (primary) hypertension: Secondary | ICD-10-CM

## 2012-12-01 DIAGNOSIS — IMO0001 Reserved for inherently not codable concepts without codable children: Secondary | ICD-10-CM

## 2012-12-01 DIAGNOSIS — R05 Cough: Secondary | ICD-10-CM

## 2012-12-01 DIAGNOSIS — R058 Other specified cough: Secondary | ICD-10-CM

## 2012-12-01 DIAGNOSIS — G8929 Other chronic pain: Secondary | ICD-10-CM

## 2012-12-01 DIAGNOSIS — Z5181 Encounter for therapeutic drug level monitoring: Secondary | ICD-10-CM | POA: Insufficient documentation

## 2012-12-01 DIAGNOSIS — F988 Other specified behavioral and emotional disorders with onset usually occurring in childhood and adolescence: Secondary | ICD-10-CM

## 2012-12-01 DIAGNOSIS — Z79899 Other long term (current) drug therapy: Secondary | ICD-10-CM

## 2012-12-01 LAB — BASIC METABOLIC PANEL
CO2: 29 mEq/L (ref 19–32)
Calcium: 9.9 mg/dL (ref 8.4–10.5)
Creat: 0.67 mg/dL (ref 0.50–1.10)

## 2012-12-01 MED ORDER — MOMETASONE FUROATE 50 MCG/ACT NA SUSP: 2.0000 | Freq: Every day | NASAL | Status: DC

## 2012-12-01 MED ORDER — BENZONATATE 100 MG PO CAPS: 100.0000 mg | ORAL_CAPSULE | Freq: Three times a day (TID) | ORAL | Status: DC | PRN

## 2012-12-01 MED ORDER — CARISOPRODOL 350 MG PO TABS: 350.0000 mg | ORAL_TABLET | Freq: Four times a day (QID) | ORAL | Status: DC

## 2012-12-01 MED ORDER — AMPHETAMINE-DEXTROAMPHETAMINE 7.5 MG PO TABS: 7.5000 mg | ORAL_TABLET | Freq: Every day | ORAL | Status: DC

## 2012-12-01 NOTE — Patient Instructions (Signed)
Foods Rich in Potassium Food / Potassium (mg)  Apricots, dried,  cup / 378 mg   Apricots, raw, 1 cup halves / 401 mg   Avocado,  / 487 mg   Banana, 1 large / 487 mg   Beef, lean, round, 3 oz / 202 mg   Cantaloupe, 1 cup cubes / 427 mg   Dates, medjool, 5 whole / 835 mg   Ham, cured, 3 oz / 212 mg   Lentils, dried,  cup / 458 mg   Lima beans, frozen,  cup / 258 mg   Orange, 1 large / 333 mg   Orange juice, 1 cup / 443 mg   Peaches, dried,  cup / 398 mg   Peas, split, cooked,  cup / 355 mg   Potato, boiled, 1 medium / 515 mg   Prunes, dried, uncooked,  cup / 318 mg   Raisins,  cup / 309 mg   Salmon, pink, raw, 3 oz / 275 mg   Sardines, canned , 3 oz / 338 mg   Tomato, raw, 1 medium / 292 mg   Tomato juice, 6 oz / 417 mg   [place], 3 oz / 349 mg  Document Released: 09/27/2005 Document Revised: 06/09/2011 Document Reviewed: 02/10/2009 [facility] Patient Information 2012 [place], [place].  Fat and Cholesterol Control Diet Cholesterol levels in your body are determined significantly by your diet. Cholesterol levels may also be related to heart disease. The following material helps to explain this relationship and discusses what you can do to help keep your heart healthy. Not all cholesterol is bad. Low-density lipoprotein (LDL) cholesterol is the "bad" cholesterol. It may cause fatty deposits to build up inside your arteries. High-density lipoprotein (HDL) cholesterol is "good." It helps to remove the "bad" LDL cholesterol from your blood. Cholesterol is a very important risk factor for heart disease. Other risk factors are high blood pressure, smoking, stress, heredity, and weight. The heart muscle gets its supply of blood through the coronary arteries. If your LDL cholesterol is high and your HDL cholesterol is low, you are at risk for having fatty deposits build up in your coronary arteries. This leaves less room through which blood can flow. Without sufficient  blood and oxygen, the heart muscle cannot function properly and you may feel chest pains (angina pectoris). When a coronary artery closes up entirely, a part of the heart muscle may die causing a heart attack (myocardial infarction). CHECKING CHOLESTEROL When your caregiver sends your blood to a lab to be examined for cholesterol, a complete lipid (fat) profile may be done. With this test, the total amount of cholesterol and levels of LDL and HDL are determined. Triglycerides are a type of fat that circulates in the blood. They can also be used to determine heart disease risk. The list below describes what the numbers should be: Test: Total Cholesterol.  Less than 200 mg/dl. Test: LDL "bad cholesterol."  Less than 100 mg/dl.  Less than 70 mg/dl if you are at very high risk of a heart attack or sudden cardiac death. Test: HDL "good cholesterol."  Greater than 50 mg/dl for women.  Greater than 40 mg/dl for men. Test: Triglycerides.  Less than 150 mg/dl. CONTROLLING CHOLESTEROL WITH DIET Although exercise and lifestyle factors are important, your diet is key. That is because certain foods are known to raise cholesterol and others to lower it. The goal is to balance foods for their effect on cholesterol and more importantly, to replace saturated and trans  fat with other types of fat, such as monounsaturated fat, polyunsaturated fat, and omega-3 fatty acids. On average, a person should consume no more than 15 to 17 g of saturated fat daily. Saturated and trans fats are considered "bad" fats, and they will raise LDL cholesterol. Saturated fats are primarily found in animal products such as meats, butter, and cream. However, that does not mean you need to give up all your favorite foods. Today, there are good tasting, low-fat, low-cholesterol substitutes for most of the things you like to eat. Choose low-fat or nonfat alternatives. Choose round or loin cuts of red meat. These types of cuts are lowest  in fat and cholesterol. Chicken (without the skin), fish, veal, and ground [place] breast are great choices. Eliminate fatty meats, such as hot dogs and salami. Even shellfish have little or no saturated fat. Have a 3 oz (85 g) portion when you eat lean meat, poultry, or fish. Trans fats are also called "partially hydrogenated oils." They are oils that have been scientifically manipulated so that they are solid at room temperature resulting in a longer shelf life and improved taste and texture of foods in which they are added. Trans fats are found in stick margarine, some tub margarines, cookies, crackers, and baked goods.  When baking and cooking, oils are a great substitute for butter. The monounsaturated oils are especially beneficial since it is believed they lower LDL and raise HDL. The oils you should avoid entirely are saturated tropical oils, such as coconut and palm.  Remember to eat a lot from food groups that are naturally free of saturated and trans fat, including fish, fruit, vegetables, beans, grains (barley, rice, couscous, bulgur wheat), and pasta (without cream sauces).  IDENTIFYING FOODS THAT LOWER CHOLESTEROL  Soluble fiber may lower your cholesterol. This type of fiber is found in fruits such as apples, vegetables such as broccoli, potatoes, and carrots, legumes such as beans, peas, and lentils, and grains such as barley. Foods fortified with plant sterols (phytosterol) may also lower cholesterol. You should eat at least 2 g per day of these foods for a cholesterol lowering effect.  Read package labels to identify low-saturated fats, trans fat free, and low-fat foods at the supermarket. Select cheeses that have only 2 to 3 g saturated fat per ounce. Use a heart-healthy tub margarine that is free of trans fats or partially hydrogenated oil. When buying baked goods (cookies, crackers), avoid partially hydrogenated oils. Breads and muffins should be made from whole grains (whole-wheat or whole  oat flour, instead of "flour" or "enriched flour"). Buy non-creamy canned soups with reduced salt and no added fats.  FOOD PREPARATION TECHNIQUES  Never deep-fry. If you must fry, either stir-fry, which uses very little fat, or use non-stick cooking sprays. When possible, broil, bake, or roast meats, and steam vegetables. Instead of putting butter or margarine on vegetables, use lemon and herbs, applesauce, and cinnamon (for squash and sweet potatoes), nonfat yogurt, salsa, and low-fat dressings for salads.  LOW-SATURATED FAT / LOW-FAT FOOD SUBSTITUTES Meats / Saturated Fat (g)  Avoid: Steak, marbled (3 oz/85 g) / 11 g  Choose: Steak, lean (3 oz/85 g) / 4 g  Avoid: Hamburger (3 oz/85 g) / 7 g  Choose: Hamburger, lean (3 oz/85 g) / 5 g  Avoid: Ham (3 oz/85 g) / 6 g  Choose: Ham, lean cut (3 oz/85 g) / 2.4 g  Avoid: Chicken, with skin, dark meat (3 oz/85 g) / 4 g  Choose: Chicken,  skin removed, dark meat (3 oz/85 g) / 2 g  Avoid: Chicken, with skin, light meat (3 oz/85 g) / 2.5 g  Choose: Chicken, skin removed, light meat (3 oz/85 g) / 1 g Dairy / Saturated Fat (g)  Avoid: Whole milk (1 cup) / 5 g  Choose: Low-fat milk, 2% (1 cup) / 3 g  Choose: Low-fat milk, 1% (1 cup) / 1.5 g  Choose: Skim milk (1 cup) / 0.3 g  Avoid: Hard cheese (1 oz/28 g) / 6 g  Choose: Skim milk cheese (1 oz/28 g) / 2 to 3 g  Avoid: Cottage cheese, 4% fat (1 cup) / 6.5 g  Choose: Low-fat cottage cheese, 1% fat (1 cup) / 1.5 g  Avoid: Ice cream (1 cup) / 9 g  Choose: Sherbet (1 cup) / 2.5 g  Choose: Nonfat frozen yogurt (1 cup) / 0.3 g  Choose: Frozen fruit bar / trace  Avoid: Whipped cream (1 tbs) / 3.5 g  Choose: Nondairy whipped topping (1 tbs) / 1 g Condiments / Saturated Fat (g)  Avoid: Mayonnaise (1 tbs) / 2 g  Choose: Low-fat mayonnaise (1 tbs) / 1 g  Avoid: Butter (1 tbs) / 7 g  Choose: Extra light margarine (1 tbs) / 1 g  Avoid: Coconut oil (1 tbs) / 11.8 g  Choose: Olive  oil (1 tbs) / 1.8 g  Choose: Corn oil (1 tbs) / 1.7 g  Choose: Safflower oil (1 tbs) / 1.2 g  Choose: Sunflower oil (1 tbs) / 1.4 g  Choose: Soybean oil (1 tbs) / 2.4 g  Choose: Canola oil (1 tbs) / 1 g Document Released: 09/27/2005 Document Revised: 12/20/2011 Document Reviewed: 03/18/2011 [facility] Patient Information 2013 [place], [place].  DASH Diet The DASH diet stands for "Dietary Approaches to Stop Hypertension." It is a healthy eating plan that has been shown to reduce high blood pressure (hypertension) in as little as 14 days, while also possibly providing other significant health benefits. These other health benefits include reducing the risk of breast cancer after menopause and reducing the risk of type 2 diabetes, heart disease, colon cancer, and stroke. Health benefits also include weight loss and slowing kidney failure in patients with chronic kidney disease.  DIET GUIDELINES  Limit salt (sodium). Your diet should contain less than 1500 mg of sodium daily.  Limit refined or processed carbohydrates. Your diet should include mostly whole grains. Desserts and added sugars should be used sparingly.  Include small amounts of heart-healthy fats. These types of fats include nuts, oils, and tub margarine. Limit saturated and trans fats. These fats have been shown to be harmful in the body. CHOOSING FOODS  The following food groups are based on a 2000 calorie diet. See your Registered Dietitian for individual calorie needs. Grains and Grain Products (6 to 8 servings daily)  Eat More Often: Whole-wheat bread, brown rice, whole-grain or wheat pasta, quinoa, popcorn without added fat or salt (air popped).  Eat Less Often: White bread, white pasta, white rice, cornbread. Vegetables (4 to 5 servings daily)  Eat More Often: Fresh, frozen, and canned vegetables. Vegetables may be raw, steamed, roasted, or grilled with a minimal amount of fat.  Eat Less Often/Avoid: Creamed or fried  vegetables. Vegetables in a cheese sauce. Fruit (4 to 5 servings daily)  Eat More Often: All fresh, canned (in natural juice), or frozen fruits. Dried fruits without added sugar. One hundred percent fruit juice ( cup [237 mL] daily).  Eat Less Often: Dried  fruits with added sugar. Canned fruit in light or heavy syrup. [employer], Fish, and Poultry (2 servings or less daily. One serving is 3 to 4 oz [85-114 g]).  Eat More Often: Ninety percent or leaner ground beef, tenderloin, sirloin. Round cuts of beef, chicken breast, [place] breast. All fish. Grill, bake, or broil your meat. Nothing should be fried.  Eat Less Often/Avoid: Fatty cuts of meat, [place], or chicken leg, thigh, or wing. Fried cuts of meat or fish. Dairy (2 to 3 servings)  Eat More Often: Low-fat or fat-free milk, low-fat plain or light yogurt, reduced-fat or part-skim cheese.  Eat Less Often/Avoid: Milk (whole, 2%).Whole milk yogurt. Full-fat cheeses. Nuts, Seeds, and Legumes (4 to 5 servings per week)  Eat More Often: All without added salt.  Eat Less Often/Avoid: Salted nuts and seeds, canned beans with added salt. Fats and Sweets (limited)  Eat More Often: Vegetable oils, tub margarines without trans fats, sugar-free gelatin. Mayonnaise and salad dressings.  Eat Less Often/Avoid: Coconut oils, palm oils, butter, stick margarine, cream, half and half, cookies, candy, pie. FOR MORE INFORMATION The Dash Diet Eating Plan: www.dashdiet.org Document Released: 09/16/2011 Document Revised: 12/20/2011 Document Reviewed: 09/16/2011 [facility] Patient Information 2013 [place], [place].

## 2012-12-01 NOTE — Progress Notes (Signed)
Subjective:    Patient ID: Gloria Carter, female    DOB: 05/23/1966, 47 y.o.   MRN: [id number] Chief Complaint  Patient presents with  . Medication Refill  . Cough    x 1 month  . Nail Problem    x 2 months    HPI  Gloria Carter is here to establish primary care with me. She is on Adderall and Soma and under our new controlled substance policy she was told she had to schedule here at [facility] and so was assigned to me.  Not good at focusing, never have been - The use of Adderall gives her several hours of focus during work to get through her paper work. Diagnosis was made by her psychologist - Dr. Ander Purpura who works out of her home.  She has not seen her for a while. She does not feel like the adderrall is lasting long enough - just a few hours.  Prior she was on an extended release formulation but this wouldn't let her sleep.   Was diagnosed with FM about 14 yrs ago and has been on soma ever since.  Cannot take potassium supplements as the pill is to big and upsets her stomach.  Makes her feel very dehydrated so stopped the K supp  - total was prob on it for about wk only. occ checks bp outside of office - usually 120/80.  Has tried 3 of the statin-type drugs - and felt "wonkey, weird, pain in legs, head felt strange" while she was on them.  Had a reaction to niacin a long time ago - flushing when took to much. Not doing fiber supplement.  Current Outpatient Prescriptions on File Prior to Visit  Medication Sig Dispense Refill  . albuterol (PROVENTIL HFA;VENTOLIN HFA) 108 (90 BASE) MCG/ACT inhaler Inhale 2 puffs into the lungs every 6 (six) hours as needed.  1 Inhaler  1  . amphetamine-dextroamphetamine (ADDERALL) 7.5 MG tablet Take 1 tablet (7.5 mg total) by mouth daily.  30 tablet  0  . Ascorbic Acid (VITAMIN C) 100 MG tablet Take 100 mg by mouth daily.      . ASTEPRO 0.15 % SOLN USE ONE OR TWO SPRAYS IN EACH NOSTRIL TWICE DAILY  30 mL  2  . azelastine (ASTELIN)  137 MCG/SPRAY nasal spray Place 2 sprays into the nose 2 (two) times daily. Use in each nostril as directed      . B Complex-C-Folic Acid (B-COMPLEX BALANCED PO) Take by mouth 1 day or 1 dose.      . calcium carbonate (OS-CAL) 600 MG TABS Take 600 mg by mouth daily.      . calcium carbonate 200 MG capsule Take 250 mg by mouth 2 (two) times daily with a meal.      . carisoprodol (SOMA) 350 MG tablet Take 1 tablet (350 mg total) by mouth 4 (four) times daily.  120 tablet  0  . cetirizine (ZYRTEC) 10 MG tablet Take 10 mg by mouth as needed.      . cholecalciferol (VITAMIN D) 400 UNITS TABS Take 400 Units by mouth daily.      . citalopram (CELEXA) 10 MG tablet Take 1 tablet (10 mg total) by mouth daily.  90 tablet  3  . esomeprazole (NEXIUM) 40 MG capsule Take 1 capsule (40 mg total) by mouth daily.  90 capsule  3  . Fluticasone-Salmeterol (ADVAIR) 100-50 MCG/DOSE AEPB Inhale 1 puff into the lungs 2 (two) times daily.  1 each  3  . guaiFENesin (MUCINEX) 600 MG 12 hr tablet Take 1,200 mg by mouth as needed.      . hyoscyamine (CYSTOSPAZ) 0.15 MG tablet Take 1 tablet (0.15 mg total) by mouth every 4 (four) hours as needed.  60 tablet  5  . hyoscyamine (LEVBID) 0.375 MG 12 hr tablet Take 1 tablet (0.375 mg total) by mouth every 12 (twelve) hours as needed for cramping.  60 tablet  5  . ibuprofen (ADVIL,MOTRIN) 200 MG tablet Take 200 mg by mouth as needed.      . montelukast (SINGULAIR) 10 MG tablet Take 1 tablet (10 mg total) by mouth at bedtime.  90 tablet  1  . Multiple Vitamin (MULTIVITAMIN) tablet Take 1 tablet by mouth daily.      . potassium chloride SA (K-DUR,KLOR-CON) 20 MEQ tablet Take 1 tablet (20 mEq total) by mouth daily.  30 tablet  0  . pregabalin (LYRICA) 150 MG capsule Take 1 capsule (150 mg total) by mouth 3 (three) times daily.  270 capsule  3  . ranitidine (ZANTAC) 150 MG tablet Take 1 tablet (150 mg total) by mouth 2 (two) times daily.  60 tablet  11  . Red Yeast Rice 600 MG CAPS Take  600 mg by mouth daily.      . SUMAtriptan (IMITREX) 100 MG tablet Take 1 tablet (100 mg total) by mouth every 2 (two) hours as needed for migraine.  4 tablet  11  . SYNTHROID 50 MCG tablet Take 1 tablet (50 mcg total) by mouth daily.  90 each  3  . telmisartan-hydrochlorothiazide (MICARDIS HCT) 40-12.5 MG per tablet Take 1 tablet by mouth daily.  90 tablet  3  . traMADol (ULTRAM) 50 MG tablet Take 1 tablet (50 mg total) by mouth 4 (four) times daily. Needs office visit for more  120 tablet  5  . azithromycin (ZITHROMAX) 250 MG tablet As packaged  6 tablet  0  . fish oil-omega-3 fatty acids 1000 MG capsule Take 1 g by mouth daily.      . mometasone (NASONEX) 50 MCG/ACT nasal spray Place 2 sprays into the nose daily.      . norethindrone (MICRONOR,CAMILA,ERRIN) 0.35 MG tablet Take 1 tablet by mouth daily.      . [DISCONTINUED] hyoscyamine (CYSTOSPAZ) 0.15 MG tablet Take 0.15 mg by mouth every 4 (four) hours as needed.       No current facility-administered medications on file prior to visit.  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  Respiratory: Positive for cough.   Cardiovascular: Negative for chest pain.  Gastrointestinal: Negative for nausea and vomiting.  Psychiatric/Behavioral: Positive for decreased concentration and agitation. Negative for behavioral problems, confusion, sleep disturbance and dysphoric mood. The patient is nervous/anxious and is hyperactive.       BP 154/88  Pulse 99  Temp(Src) 98 F (36.7 C)  Resp 18  Ht 5' 3.5" (1.613 m)  Wt 278 lb (126.1 kg)  BMI 48.47 kg/m2 Objective:   Physical Exam  Constitutional: She is oriented to person, place, and time. She appears well-developed and well-nourished. No distress.  Morbidly obese  HENT:  Head: Normocephalic and atraumatic.  Right Ear: External ear normal.   Left Ear: External ear normal.  Eyes: Conjunctivae are normal. No scleral icterus.  Neck: Normal range of motion. Neck supple. No thyromegaly present.  Cardiovascular: Normal rate, regular  rhythm, normal heart sounds and intact distal pulses.   Pulmonary/Chest: Effort normal and breath sounds normal. No respiratory distress.  Musculoskeletal: She exhibits no edema.  Lymphadenopathy:    She has no cervical adenopathy.  Neurological: She is alert and oriented to person, place, and time.  Skin: Skin is warm and dry. She is not diaphoretic. No erythema.  Psychiatric: She has a normal mood and affect. Her behavior is normal.          Assessment & Plan:  ADD (attention deficit disorder) - Plan: amphetamine-dextroamphetamine (ADDERALL) 7.5 MG tablet, DISCONTINUED: amphetamine-dextroamphetamine (ADDERALL) 7.5 MG tablet, DISCONTINUED: amphetamine-dextroamphetamine (ADDERALL) 7.5 MG tablet - Refilled 3 mos of Adderall rx for pt.  She will need to f/u for refills prior to 02/28/13  Fibromyalgia - Plan: carisoprodol (SOMA) 350 MG tablet - refilled x 3 mos. She will have to have an OV for further refills.  Chronic pain - Plan: carisoprodol (SOMA) 350 MG tablet  Encounter for long-term (current) use of other medications - Plan: Basic metabolic panel. H/o low K due to hctz but can't tolerate K supp so need to keep a close eye on K levels. Pt's medication list is HUGE. We spent over 30 min in our visit reviewing her medication list and trying to pare down what she is actually taking and who is rxing it. Rec to bring bottles at f/u so we can ensure list is correct.  Also needs UDS at f/u.  Post-viral cough syndrome - Plan: benzonatate (TESSALON) 100 MG capsule  HPL - Pt intolerance of statin, niacin and non-compliant with fiber.  Meds ordered this encounter  Medications  . DISCONTD: amphetamine-dextroamphetamine (ADDERALL) 7.5 MG tablet    Sig: Take 1 tablet (7.5 mg total) by mouth daily.     Dispense:  30 tablet    Refill:  0  . mometasone (NASONEX) 50 MCG/ACT nasal spray    Sig: Place 2 sprays into the nose daily.    Dispense:  17 g    Refill:  5  . carisoprodol (SOMA) 350 MG tablet    Sig: Take 1 tablet (350 mg total) by mouth 4 (four) times daily.    Dispense:  120 tablet    Refill:  2  . DISCONTD: amphetamine-dextroamphetamine (ADDERALL) 7.5 MG tablet    Sig: Take 1 tablet (7.5 mg total) by mouth daily. May fill on or after 12/29/12.    Dispense:  30 tablet    Refill:  0  . amphetamine-dextroamphetamine (ADDERALL) 7.5 MG tablet    Sig: Take 1 tablet (7.5 mg total) by mouth daily. May fill on or after 01/29/13.    Dispense:  30 tablet    Refill:  0  . benzonatate (TESSALON) 100 MG capsule    Sig: Take 1 capsule (100 mg total) by mouth 3 (three) times daily as needed for cough.    Dispense:  30 capsule    Refill:  0

## 2013-02-21 ENCOUNTER — Telehealth: Payer: Self-pay

## 2013-02-21 DIAGNOSIS — M797 Fibromyalgia: Secondary | ICD-10-CM

## 2013-02-21 DIAGNOSIS — G8929 Other chronic pain: Secondary | ICD-10-CM

## 2013-02-21 NOTE — Telephone Encounter (Signed)
Pharm requests RF of Soma 350 mg QID.

## 2013-02-22 NOTE — Telephone Encounter (Signed)
No refills on controlled meds w/o OV.  Pt has appt sched w/ me on 5/23 and was given a 3 mo supply on 2/21 so should be able to make it till then.

## 2013-03-02 ENCOUNTER — Ambulatory Visit (INDEPENDENT_AMBULATORY_CARE_PROVIDER_SITE_OTHER): Payer: 59 | Admitting: Family Medicine

## 2013-03-02 ENCOUNTER — Encounter: Payer: Self-pay | Admitting: Family Medicine

## 2013-03-02 VITALS — BP 130/90 | HR 98 | Temp 97.9°F | Resp 16 | Ht 63.0 in | Wt 282.6 lb

## 2013-03-02 DIAGNOSIS — K589 Irritable bowel syndrome without diarrhea: Secondary | ICD-10-CM

## 2013-03-02 DIAGNOSIS — IMO0001 Reserved for inherently not codable concepts without codable children: Secondary | ICD-10-CM

## 2013-03-02 DIAGNOSIS — Z79899 Other long term (current) drug therapy: Secondary | ICD-10-CM

## 2013-03-02 DIAGNOSIS — M797 Fibromyalgia: Secondary | ICD-10-CM

## 2013-03-02 DIAGNOSIS — F988 Other specified behavioral and emotional disorders with onset usually occurring in childhood and adolescence: Secondary | ICD-10-CM

## 2013-03-02 DIAGNOSIS — J45909 Unspecified asthma, uncomplicated: Secondary | ICD-10-CM

## 2013-03-02 DIAGNOSIS — G8929 Other chronic pain: Secondary | ICD-10-CM

## 2013-03-02 DIAGNOSIS — Z1231 Encounter for screening mammogram for malignant neoplasm of breast: Secondary | ICD-10-CM

## 2013-03-02 DIAGNOSIS — Z1239 Encounter for other screening for malignant neoplasm of breast: Secondary | ICD-10-CM

## 2013-03-02 MED ORDER — FLUTICASONE-SALMETEROL 100-50 MCG/DOSE IN AEPB: 1.0000 | INHALATION_SPRAY | Freq: Two times a day (BID) | RESPIRATORY_TRACT | Status: DC

## 2013-03-02 MED ORDER — AMPHETAMINE-DEXTROAMPHETAMINE 7.5 MG PO TABS: 7.5000 mg | ORAL_TABLET | Freq: Every day | ORAL | Status: DC

## 2013-03-02 MED ORDER — HYOSCYAMINE SULFATE ER 0.375 MG PO TB12: 0.3750 mg | ORAL_TABLET | Freq: Two times a day (BID) | ORAL | Status: DC | PRN

## 2013-03-02 MED ORDER — CARISOPRODOL 350 MG PO TABS: 350.0000 mg | ORAL_TABLET | Freq: Four times a day (QID) | ORAL | Status: DC

## 2013-03-02 MED ORDER — TRAMADOL HCL 50 MG PO TABS: 50.0000 mg | ORAL_TABLET | Freq: Four times a day (QID) | ORAL | Status: DC

## 2013-03-02 MED ORDER — MONTELUKAST SODIUM 10 MG PO TABS: 10.0000 mg | ORAL_TABLET | Freq: Every day | ORAL | Status: DC

## 2013-03-02 MED ORDER — MOMETASONE FUROATE 50 MCG/ACT NA SUSP: 2.0000 | Freq: Every day | NASAL | Status: DC

## 2013-03-02 MED ORDER — ALBUTEROL SULFATE HFA 108 (90 BASE) MCG/ACT IN AERS: 2.0000 | INHALATION_SPRAY | Freq: Four times a day (QID) | RESPIRATORY_TRACT | Status: DC | PRN

## 2013-03-02 NOTE — Progress Notes (Signed)
Subjective:    Patient ID: Gloria Carter, female    DOB: [birth year], 47 y.o.   MRN: [id number] Chief Complaint  Patient presents with  . Follow-up    3 month how patient is doing  . Medication Refill    HPI  Gloria Carter is feeling very achy and tired but she attributes this most to her work.  She got very behind at work and so is having to put in some long hours catching up at home.  Past Medical History  Diagnosis Date  . Asthma   . Dysphagia   . Fibromyalgia   . Migraine   . GERD (gastroesophageal reflux disease)   . ADD (attention deficit disorder)   . Hernia of abdominal wall   . Allergy   . Anemia   . Hypertension   . Thyroid disease   . Fibromyalgia   . Arthritis    Current Outpatient Prescriptions on File Prior to Visit  Medication Sig Dispense Refill  . Ascorbic Acid (VITAMIN C) 100 MG tablet Take 100 mg by mouth daily.      . B Complex-C-Folic Acid (B-COMPLEX BALANCED PO) Take by mouth 1 day or 1 dose.      . calcium carbonate (OS-CAL) 600 MG TABS Take 600 mg by mouth daily.      . cetirizine (ZYRTEC) 10 MG tablet Take 10 mg by mouth as needed.      . cholecalciferol (VITAMIN D) 400 UNITS TABS Take 400 Units by mouth daily.      . citalopram (CELEXA) 10 MG tablet Take 1 tablet (10 mg total) by mouth daily.  90 tablet  3  . esomeprazole (NEXIUM) 40 MG capsule Take 1 capsule (40 mg total) by mouth daily.  90 capsule  3  . ibuprofen (ADVIL,MOTRIN) 200 MG tablet Take 200 mg by mouth as needed.      . Multiple Vitamin (MULTIVITAMIN) tablet Take 1 tablet by mouth daily.      . pregabalin (LYRICA) 150 MG capsule Take 1 capsule (150 mg total) by mouth 3 (three) times daily.  270 capsule  3  . ranitidine (ZANTAC) 150 MG tablet Take 1 tablet (150 mg total) by mouth 2 (two) times daily.  60 tablet  11  . Red Yeast Rice 600 MG CAPS Take 600 mg by mouth daily.      . SUMAtriptan (IMITREX) 100 MG tablet Take 1 tablet (100 mg total) by mouth every 2 (two) hours as needed for  migraine.  4 tablet  11  . SYNTHROID 50 MCG tablet Take 1 tablet (50 mcg total) by mouth daily.  90 each  3  . telmisartan-hydrochlorothiazide (MICARDIS HCT) 40-12.5 MG per tablet Take 1 tablet by mouth daily.  90 tablet  3  . guaiFENesin (MUCINEX) 600 MG 12 hr tablet Take 1,200 mg by mouth as needed.      . [DISCONTINUED] hyoscyamine (CYSTOSPAZ) 0.15 MG tablet Take 0.15 mg by mouth every 4 (four) hours as needed.       No current facility-administered medications on file prior to visit.   Allergies  Allergen Reactions  . Codeine Nausea Only    Can tolerate hydromet and tussionex  . Sulfa Antibiotics Nausea And Vomiting     Review of Systems  Constitutional: Positive for activity change and fatigue. Negative for fever, chills and unexpected weight change.  Musculoskeletal: Positive for myalgias, back pain, arthralgias and gait problem. Negative for joint swelling.  Skin: Negative for color change and rash.  Neurological: Positive  for weakness and headaches. Negative for numbness.  Psychiatric/Behavioral: Positive for sleep disturbance and decreased concentration.      BP 130/90  Pulse 98  Temp(Src) 97.9 F (36.6 C) (Oral)  Resp 16  Ht 5\' 3"  (1.6 m)  Wt 282 lb 9.6 oz (128.187 kg)  BMI 50.07 kg/m2  SpO2 91% Objective:   Physical Exam  Constitutional: She is oriented to person, place, and time. She appears well-developed and well-nourished. No distress.  Walking using cane  HENT:  Head: Normocephalic and atraumatic.  Right Ear: External ear normal.  Left Ear: External ear normal.  Eyes: Conjunctivae are normal. No scleral icterus.  Neck: Normal range of motion. Neck supple. No thyromegaly present.  Cardiovascular: Normal rate, regular rhythm, normal heart sounds and intact distal pulses.   Pulmonary/Chest: Effort normal and breath sounds normal. No respiratory distress.  Musculoskeletal: She exhibits no edema.  Lymphadenopathy:    She has no cervical adenopathy.   Neurological: She is alert and oriented to person, place, and time.  Skin: Skin is warm and dry. She is not diaphoretic. No erythema.  Psychiatric: She has a normal mood and affect. Her behavior is normal.          Assessment & Plan:  Fibromyalgia - Plan: traMADol (ULTRAM) 50 MG tablet, carisoprodol (SOMA) 350 MG tablet  Encounter for long-term (current) use of other medications  Chronic pain - Plan: carisoprodol (SOMA) 350 MG tablet - refilled x 3 mos.  ADD (attention deficit disorder) - Plan: amphetamine-dextroamphetamine (ADDERALL) 7.5 MG tablet, DISCONTINUED: amphetamine-dextroamphetamine (ADDERALL) 7.5 MG tablet, DISCONTINUED: amphetamine-dextroamphetamine (ADDERALL) 7.5 MG tablet - gave 3 mo rx to pt to no refills indicated till 8/23 which will require an OV.  Asthma - Plan: montelukast (SINGULAIR) 10 MG tablet, Fluticasone-Salmeterol (ADVAIR) 100-50 MCG/DOSE AEPB   IBS (irritable bowel syndrome) - Plan: hyoscyamine (LEVBID) 0.375 MG 12 hr tablet  Asthma exacerbation, allergic - Plan: albuterol (PROVENTIL HFA;VENTOLIN HFA) 108 (90 BASE) MCG/ACT inhaler  Screening breast examination - Plan: MM Digital Screening  Oxygen sat low - recheck at f/u  Meds ordered this encounter  Medications  . traMADol (ULTRAM) 50 MG tablet    Sig: Take 1 tablet (50 mg total) by mouth 4 (four) times daily. Needs office visit for more    Dispense:  120 tablet    Refill:  5  . montelukast (SINGULAIR) 10 MG tablet    Sig: Take 1 tablet (10 mg total) by mouth at bedtime.    Dispense:  90 tablet    Refill:  1  . mometasone (NASONEX) 50 MCG/ACT nasal spray    Sig: Place 2 sprays into the nose daily.    Dispense:  17 g    Refill:  5  . hyoscyamine (LEVBID) 0.375 MG 12 hr tablet    Sig: Take 1 tablet (0.375 mg total) by mouth every 12 (twelve) hours as needed for cramping.    Dispense:  60 tablet    Refill:  5  . Fluticasone-Salmeterol (ADVAIR) 100-50 MCG/DOSE AEPB    Sig: Inhale 1 puff into  the lungs 2 (two) times daily.    Dispense:  1 each    Refill:  5  . carisoprodol (SOMA) 350 MG tablet    Sig: Take 1 tablet (350 mg total) by mouth 4 (four) times daily.    Dispense:  120 tablet    Refill:  2  . albuterol (PROVENTIL HFA;VENTOLIN HFA) 108 (90 BASE) MCG/ACT inhaler    Sig: Inhale 2 puffs into  the lungs every 6 (six) hours as needed.    Dispense:  1 Inhaler    Refill:  1  . DISCONTD: amphetamine-dextroamphetamine (ADDERALL) 7.5 MG tablet    Sig: Take 1 tablet (7.5 mg total) by mouth daily. May fill on or after 03/02/13.    Dispense:  30 tablet    Refill:  0  . DISCONTD: amphetamine-dextroamphetamine (ADDERALL) 7.5 MG tablet    Sig: Take 1 tablet (7.5 mg total) by mouth daily. May fill on or after 04/02/13.    Dispense:  30 tablet    Refill:  0  . amphetamine-dextroamphetamine (ADDERALL) 7.5 MG tablet    Sig: Take 1 tablet (7.5 mg total) by mouth daily. May fill on or after 05/02/13.    Dispense:  30 tablet    Refill:  0

## 2013-04-03 ENCOUNTER — Other Ambulatory Visit: Payer: Self-pay | Admitting: Family Medicine

## 2013-04-23 ENCOUNTER — Telehealth: Payer: Self-pay

## 2013-04-23 NOTE — Telephone Encounter (Signed)
Dr Clelia Croft, pharmacy is requesting RF of Lyrica 150 mg. I'm not sure that you have Rxd this for pt before, but it appears pt is seeing you now and has another appt sch for 06/01/13. Do you want to Rx for pt?

## 2013-04-23 NOTE — Telephone Encounter (Signed)
Looks like pt was rx'ed a 1 yr rx on 08/13/12 so she should have another 90d refill left before she needs a new rx. Has she been taking a lot more than rx'ed and refilling early?? Or does pharmacy have a rx on file for her?

## 2013-04-24 NOTE — Telephone Encounter (Signed)
Unfortunately the most we can refill for this is for 6 months, since it is controlled, I have called in the additional amount to the pharmacy. Not sure why this was not already done. To you FYI

## 2013-06-01 ENCOUNTER — Ambulatory Visit (INDEPENDENT_AMBULATORY_CARE_PROVIDER_SITE_OTHER): Payer: 59 | Admitting: Family Medicine

## 2013-06-01 VITALS — BP 133/87 | HR 104 | Temp 98.5°F | Resp 18 | Ht 63.0 in | Wt 288.0 lb

## 2013-06-01 DIAGNOSIS — M549 Dorsalgia, unspecified: Secondary | ICD-10-CM

## 2013-06-01 DIAGNOSIS — F988 Other specified behavioral and emotional disorders with onset usually occurring in childhood and adolescence: Secondary | ICD-10-CM

## 2013-06-01 DIAGNOSIS — G8929 Other chronic pain: Secondary | ICD-10-CM

## 2013-06-01 DIAGNOSIS — K439 Ventral hernia without obstruction or gangrene: Secondary | ICD-10-CM

## 2013-06-01 DIAGNOSIS — M778 Other enthesopathies, not elsewhere classified: Secondary | ICD-10-CM

## 2013-06-01 DIAGNOSIS — E785 Hyperlipidemia, unspecified: Secondary | ICD-10-CM

## 2013-06-01 DIAGNOSIS — K589 Irritable bowel syndrome without diarrhea: Secondary | ICD-10-CM

## 2013-06-01 DIAGNOSIS — G5600 Carpal tunnel syndrome, unspecified upper limb: Secondary | ICD-10-CM

## 2013-06-01 MED ORDER — AMPHETAMINE-DEXTROAMPHETAMINE 7.5 MG PO TABS: 7.5000 mg | ORAL_TABLET | Freq: Every day | ORAL | Status: DC

## 2013-06-01 NOTE — Progress Notes (Signed)
Subjective:    Patient ID: Gloria Carter, female    DOB: October 26, 1965, 47 y.o.   MRN: [id number]  HPI  Umbilical hernia worsening. At times it feels like it won't go back in and has to hold it in whenever she coughs which is frequent.  Getting larger and more painful.  Also with carpal tunnel and tendonitis worsening.  To busy to take off for surgeries.  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  Father was head of radiology at [facility] so she used to have a lot of lung xrays to eval her chronic cough - not in the system - all "of the books" so we don't have copies. Cough has been dry and hacking and bothering her her whole life.  Still really behind at work - tons of notes to catch up on. Her boss is getting mad at her.   Past Medical History  Diagnosis Date  . Asthma   . Dysphagia   . Fibromyalgia   . Migraine   . GERD (gastroesophageal reflux disease)   . ADD (attention deficit disorder)   . Hernia of abdominal wall   . Allergy   . Anemia   . Hypertension   . Thyroid disease   . Fibromyalgia   . Arthritis    Current Outpatient Prescriptions on File Prior to Visit  Medication Sig Dispense Refill  . albuterol (PROVENTIL HFA;VENTOLIN HFA) 108 (90 BASE) MCG/ACT inhaler Inhale 2 puffs into the lungs every 6 (six) hours as needed.  1 Inhaler  1  . Ascorbic Acid (VITAMIN C) 100 MG tablet Take 100 mg by mouth daily.      . B Complex-C-Folic Acid (B-COMPLEX BALANCED PO) Take by mouth 1 day or 1 dose.      . calcium carbonate (OS-CAL) 600 MG TABS Take 600 mg by mouth daily.      . cetirizine (ZYRTEC) 10 MG tablet Take 10 mg by mouth as needed.      . cholecalciferol (VITAMIN D) 400 UNITS TABS Take 400 Units by mouth daily.      Marland Kitchen guaiFENesin (MUCINEX) 600 MG 12 hr tablet Take 1,200 mg by mouth as needed.      Marland Kitchen ibuprofen (ADVIL,MOTRIN) 200 MG tablet Take 200 mg by mouth as needed.      . Multiple Vitamin (MULTIVITAMIN) tablet Take 1 tablet by mouth daily.       . Red Yeast Rice 600 MG CAPS Take 600 mg by mouth daily.      . [DISCONTINUED] hyoscyamine (CYSTOSPAZ) 0.15 MG tablet Take 0.15 mg by mouth every 4 (four) hours as needed.       No current facility-administered medications on file prior to visit.   Allergies  Allergen Reactions  . Codeine Nausea Only    Can tolerate hydromet and tussionex  . Sulfa Antibiotics Nausea And Vomiting    Review of Systems  Constitutional: Positive for activity change, fatigue and unexpected weight change. Negative for fever, chills, diaphoresis and appetite change.  Respiratory: Positive for cough.   Cardiovascular: Negative for chest pain.  Gastrointestinal: Positive for abdominal pain and abdominal distention. Negative for nausea, vomiting, diarrhea and constipation.  Genitourinary: Negative for urgency, frequency, decreased urine volume and difficulty urinating.  Musculoskeletal: Positive for arthralgias, back pain, gait problem and myalgias. Negative for joint swelling.  Skin: Negative for color change and rash.  Neurological: Positive for weakness and headaches. Negative for numbness.  Hematological: Negative for adenopathy. Does not bruise/bleed  easily.  Psychiatric/Behavioral: Positive for sleep disturbance, dysphoric mood and decreased concentration. Negative for behavioral problems, confusion and agitation. The patient is not nervous/anxious and is not hyperactive.       BP 133/87  Pulse 104  Temp(Src) 98.5 F (36.9 C)  Resp 18  Ht 5\' 3"  (1.6 m)  Wt 288 lb (130.636 kg)  BMI 51.03 kg/m2 Objective:   Physical Exam  Constitutional: She is oriented to person, place, and time. She appears well-developed and well-nourished. No distress.  HENT:  Head: Normocephalic and atraumatic.  Right Ear: External ear normal.  Left Ear: External ear normal.  Eyes: Conjunctivae are normal. No scleral icterus.  Neck: Normal range of motion. Neck supple. No thyromegaly present.  Cardiovascular: Normal rate,  regular rhythm, normal heart sounds and intact distal pulses.   Pulmonary/Chest: Effort normal and breath sounds normal. No respiratory distress.  Musculoskeletal: She exhibits no edema.  Lymphadenopathy:    She has no cervical adenopathy.  Neurological: She is alert and oriented to person, place, and time.  Skin: Skin is warm and dry. She is not diaphoretic. No erythema.  Psychiatric: Her speech is normal and behavior is normal. Thought content normal. Her affect is blunt. Cognition and memory are normal. She exhibits a depressed mood.          Assessment & Plan:  Cough - consider xray monitoring due to h/o asbestos exposure but pt reports chronic so will defer for now.  Chronic upper back pain  ADD (attention deficit disorder) - Plan: DISCONTINUED: amphetamine-dextroamphetamine (ADDERALL) 7.5 MG tablet, DISCONTINUED: amphetamine-dextroamphetamine (ADDERALL) 7.5 MG tablet, DISCONTINUED: amphetamine-dextroamphetamine (ADDERALL) 7.5 MG tablet  Hernia of abdominal wall  IBS (irritable bowel syndrome)  Hyperlipidemia  Carpal tunnel syndrome, unspecified laterality  Wrist tendonitis  Meds ordered this encounter  Medications  . amphetamine-dextroamphetamine (ADDERALL) 7.5 MG tablet    Sig: Take 1 tablet (7.5 mg total) by mouth daily. May fill on or after 06/02/13.    Dispense:  30 tablet    Refill:  0  . amphetamine-dextroamphetamine (ADDERALL) 7.5 MG tablet    Sig: Take 1 tablet (7.5 mg total) by mouth daily. May fill on or after 07/03/13.    Dispense:  30 tablet    Refill:  0  . amphetamine-dextroamphetamine (ADDERALL) 7.5 MG tablet    Sig: Take 1 tablet (7.5 mg total) by mouth daily. May fill on or after 08/02/13.    Dispense:  30 tablet    Refill:  0    I personally performed the services described in this documentation, which was scribed in my presence. The recorded information has been reviewed and considered, and addended by me as needed.  Norberto Sorenson, MD MPH

## 2013-06-21 ENCOUNTER — Telehealth: Payer: Self-pay

## 2013-06-21 DIAGNOSIS — G8929 Other chronic pain: Secondary | ICD-10-CM

## 2013-06-21 DIAGNOSIS — M797 Fibromyalgia: Secondary | ICD-10-CM

## 2013-06-21 MED ORDER — CARISOPRODOL 350 MG PO TABS: 350.0000 mg | ORAL_TABLET | Freq: Four times a day (QID) | ORAL | Status: DC | PRN

## 2013-06-21 NOTE — Telephone Encounter (Signed)
Thank you :)

## 2013-06-21 NOTE — Telephone Encounter (Signed)
Refill called in to pharmacy

## 2013-06-21 NOTE — Telephone Encounter (Signed)
Pharm requests Rf of Soma 350 mg.

## 2013-08-10 ENCOUNTER — Ambulatory Visit: Payer: Self-pay | Admitting: Family Medicine

## 2013-08-16 ENCOUNTER — Other Ambulatory Visit: Payer: Self-pay

## 2013-08-25 ENCOUNTER — Other Ambulatory Visit: Payer: Self-pay | Admitting: Internal Medicine

## 2013-08-31 ENCOUNTER — Ambulatory Visit (INDEPENDENT_AMBULATORY_CARE_PROVIDER_SITE_OTHER): Payer: 59 | Admitting: Family Medicine

## 2013-08-31 ENCOUNTER — Ambulatory Visit: Payer: 59

## 2013-08-31 ENCOUNTER — Encounter: Payer: Self-pay | Admitting: Family Medicine

## 2013-08-31 VITALS — BP 146/98 | HR 99 | Temp 98.5°F | Resp 18 | Ht 63.0 in | Wt 287.4 lb

## 2013-08-31 DIAGNOSIS — R7303 Prediabetes: Secondary | ICD-10-CM

## 2013-08-31 DIAGNOSIS — R05 Cough: Secondary | ICD-10-CM

## 2013-08-31 DIAGNOSIS — R0982 Postnasal drip: Secondary | ICD-10-CM

## 2013-08-31 DIAGNOSIS — I10 Essential (primary) hypertension: Secondary | ICD-10-CM

## 2013-08-31 DIAGNOSIS — R053 Chronic cough: Secondary | ICD-10-CM

## 2013-08-31 DIAGNOSIS — Z6841 Body Mass Index (BMI) 40.0 and over, adult: Secondary | ICD-10-CM

## 2013-08-31 DIAGNOSIS — Z79899 Other long term (current) drug therapy: Secondary | ICD-10-CM

## 2013-08-31 DIAGNOSIS — IMO0001 Reserved for inherently not codable concepts without codable children: Secondary | ICD-10-CM

## 2013-08-31 DIAGNOSIS — E039 Hypothyroidism, unspecified: Secondary | ICD-10-CM | POA: Insufficient documentation

## 2013-08-31 DIAGNOSIS — K439 Ventral hernia without obstruction or gangrene: Secondary | ICD-10-CM

## 2013-08-31 DIAGNOSIS — R058 Other specified cough: Secondary | ICD-10-CM | POA: Insufficient documentation

## 2013-08-31 DIAGNOSIS — J45909 Unspecified asthma, uncomplicated: Secondary | ICD-10-CM | POA: Insufficient documentation

## 2013-08-31 DIAGNOSIS — E876 Hypokalemia: Secondary | ICD-10-CM

## 2013-08-31 DIAGNOSIS — K589 Irritable bowel syndrome without diarrhea: Secondary | ICD-10-CM

## 2013-08-31 DIAGNOSIS — F988 Other specified behavioral and emotional disorders with onset usually occurring in childhood and adolescence: Secondary | ICD-10-CM

## 2013-08-31 DIAGNOSIS — K219 Gastro-esophageal reflux disease without esophagitis: Secondary | ICD-10-CM

## 2013-08-31 DIAGNOSIS — G43909 Migraine, unspecified, not intractable, without status migrainosus: Secondary | ICD-10-CM

## 2013-08-31 DIAGNOSIS — G8929 Other chronic pain: Secondary | ICD-10-CM

## 2013-08-31 DIAGNOSIS — M797 Fibromyalgia: Secondary | ICD-10-CM

## 2013-08-31 DIAGNOSIS — E785 Hyperlipidemia, unspecified: Secondary | ICD-10-CM

## 2013-08-31 DIAGNOSIS — R059 Cough, unspecified: Secondary | ICD-10-CM

## 2013-08-31 LAB — CBC WITH DIFFERENTIAL/PLATELET
Basophils Relative: 1 % (ref 0–1)
Eosinophils Absolute: 0.3 10*3/uL (ref 0.0–0.7)
Eosinophils Relative: 3 % (ref 0–5)
HCT: 35.1 % — ABNORMAL LOW (ref 36.0–46.0)
Hemoglobin: 12.2 g/dL (ref 12.0–15.0)
Lymphs Abs: 2.4 10*3/uL (ref 0.7–4.0)
MCH: 28.2 pg (ref 26.0–34.0)
MCHC: 34.8 g/dL (ref 30.0–36.0)
MCV: 81.1 fL (ref 78.0–100.0)
Monocytes Absolute: 0.9 10*3/uL (ref 0.1–1.0)
Monocytes Relative: 10 % (ref 3–12)
Neutrophils Relative %: 59 % (ref 43–77)
RBC: 4.33 MIL/uL (ref 3.87–5.11)

## 2013-08-31 LAB — COMPREHENSIVE METABOLIC PANEL
AST: 23 U/L (ref 0–37)
Albumin: 4.1 g/dL (ref 3.5–5.2)
Alkaline Phosphatase: 101 U/L (ref 39–117)
BUN: 13 mg/dL (ref 6–23)
Creat: 0.61 mg/dL (ref 0.50–1.10)
Glucose, Bld: 99 mg/dL (ref 70–99)
Potassium: 3.3 mEq/L — ABNORMAL LOW (ref 3.5–5.3)
Total Bilirubin: 0.5 mg/dL (ref 0.3–1.2)

## 2013-08-31 LAB — LIPID PANEL
Cholesterol: 232 mg/dL — ABNORMAL HIGH (ref 0–200)
HDL: 46 mg/dL (ref >39)
Total CHOL/HDL Ratio: 5 Ratio
VLDL: 33 mg/dL (ref 0–40)

## 2013-08-31 LAB — TSH: TSH: 2.049 u[IU]/mL (ref 0.350–4.500)

## 2013-08-31 LAB — HEMOGLOBIN A1C: Hgb A1c MFr Bld: 6.1 % — ABNORMAL HIGH (ref <5.7)

## 2013-08-31 MED ORDER — AMPHETAMINE-DEXTROAMPHETAMINE 7.5 MG PO TABS: 7.5000 mg | ORAL_TABLET | Freq: Every day | ORAL | Status: DC

## 2013-08-31 MED ORDER — SYNTHROID 50 MCG PO TABS: 50.0000 ug | ORAL_TABLET | Freq: Every day | ORAL | Status: DC

## 2013-08-31 MED ORDER — MOMETASONE FUROATE 50 MCG/ACT NA SUSP: 2.0000 | Freq: Every day | NASAL | Status: DC

## 2013-08-31 MED ORDER — ESOMEPRAZOLE MAGNESIUM 40 MG PO CPDR: 40.0000 mg | DELAYED_RELEASE_CAPSULE | Freq: Every day | ORAL | Status: DC

## 2013-08-31 MED ORDER — FLUTICASONE-SALMETEROL 100-50 MCG/DOSE IN AEPB: 1.0000 | INHALATION_SPRAY | Freq: Two times a day (BID) | RESPIRATORY_TRACT | Status: DC

## 2013-08-31 MED ORDER — SUMATRIPTAN SUCCINATE 100 MG PO TABS: 100.0000 mg | ORAL_TABLET | ORAL | Status: DC | PRN

## 2013-08-31 MED ORDER — RANITIDINE HCL 150 MG PO TABS: 150.0000 mg | ORAL_TABLET | Freq: Two times a day (BID) | ORAL | Status: DC

## 2013-08-31 MED ORDER — MONTELUKAST SODIUM 10 MG PO TABS: 10.0000 mg | ORAL_TABLET | Freq: Every day | ORAL | Status: DC

## 2013-08-31 MED ORDER — TRAMADOL HCL 50 MG PO TABS: 50.0000 mg | ORAL_TABLET | Freq: Four times a day (QID) | ORAL | Status: DC

## 2013-08-31 MED ORDER — HYOSCYAMINE SULFATE ER 0.375 MG PO TB12: 0.3750 mg | ORAL_TABLET | Freq: Two times a day (BID) | ORAL | Status: DC | PRN

## 2013-08-31 MED ORDER — PREGABALIN 150 MG PO CAPS: 150.0000 mg | ORAL_CAPSULE | Freq: Three times a day (TID) | ORAL | Status: DC

## 2013-08-31 MED ORDER — TELMISARTAN-HCTZ 80-12.5 MG PO TABS: 1.0000 | ORAL_TABLET | Freq: Every day | ORAL | Status: DC

## 2013-08-31 MED ORDER — CITALOPRAM HYDROBROMIDE 10 MG PO TABS: 10.0000 mg | ORAL_TABLET | Freq: Every day | ORAL | Status: DC

## 2013-08-31 MED ORDER — CARISOPRODOL 350 MG PO TABS: 350.0000 mg | ORAL_TABLET | Freq: Four times a day (QID) | ORAL | Status: DC | PRN

## 2013-08-31 NOTE — Progress Notes (Addendum)
Subjective:    Patient ID: Gloria Carter, female    DOB: 1966-09-01, 47 y.o.   MRN: [id number] Chief Complaint  Patient presents with  . 3 month check up    HPI  Gloria Carter is a 47 yo woman with an extensive PMHx including but not limited to chronic pain, ADD, fibromyalgia, depression, hypertension, hyperlipidemia, and obesity.  She is here for her q3 mo appt for refills on her controlled medications. She has been very stressed out and fatigued w/ work which is her baseline - always behind w/ her case files. 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  Has a gynecologist she goes to for her pap smears but not on birth control and doesn't have periods.   Gloria Carter cont to c/o long hx - years - of freq cough - usu prod of clear phlegm - only a few times w/ small amounts of hemoptysis and usu attributed to simultaneous epistaxis.  Her building is supposedly completely cleared of asbestos but she is unsure if this is true and is concerned about her exposure prior and during Holiday representative. Dad used to be chief of xray tech at [employer] so pt reports she had lots of prior undocumented chest xrays - would get one whenever her cough worsened but was likely "off the books" so results are unavailable to [place] now. Does c/o lots of post-nasal drip and is clearing her throat a lot.  Uses nasal saline and nasonex sometimes but not w/ any regularity.  Uses mucinex a lot.  Is taking zyrtec and singulair daily. Not using cough drops or hard candy as they make her stomach sick.  Only uses cough medicine when sick but usually comes to the doctor for this - does not usu use otc products.  Past Medical History  Diagnosis Date  . Asthma   . Dysphagia   . Fibromyalgia   . Migraine   . GERD (gastroesophageal reflux disease)   . ADD (attention deficit disorder)   . Hernia of abdominal wall   . Allergy   . Anemia   . Hypertension   . Thyroid disease   . Fibromyalgia   . Arthritis    Current  Outpatient Prescriptions on File Prior to Visit  Medication Sig Dispense Refill  . albuterol (PROVENTIL HFA;VENTOLIN HFA) 108 (90 BASE) MCG/ACT inhaler Inhale 2 puffs into the lungs every 6 (six) hours as needed.  1 Inhaler  1  . Ascorbic Acid (VITAMIN C) 100 MG tablet Take 100 mg by mouth daily.      . B Complex-C-Folic Acid (B-COMPLEX BALANCED PO) Take by mouth 1 day or 1 dose.      . calcium carbonate (OS-CAL) 600 MG TABS Take 600 mg by mouth daily.      . cetirizine (ZYRTEC) 10 MG tablet Take 10 mg by mouth as needed.      . cholecalciferol (VITAMIN D) 400 UNITS TABS Take 400 Units by mouth daily.      Marland Kitchen guaiFENesin (MUCINEX) 600 MG 12 hr tablet Take 1,200 mg by mouth as needed.      Marland Kitchen ibuprofen (ADVIL,MOTRIN) 200 MG tablet Take 200 mg by mouth as needed.      . Multiple Vitamin (MULTIVITAMIN) tablet Take 1 tablet by mouth daily.      . Red Yeast Rice 600 MG CAPS Take 600 mg by mouth daily.      . [DISCONTINUED] hyoscyamine (CYSTOSPAZ) 0.15 MG tablet Take 0.15 mg by mouth every 4 (  four) hours as needed.       No current facility-administered medications on file prior to visit.   Allergies  Allergen Reactions  . Codeine Nausea Only    Can tolerate hydromet and tussionex  . Sulfa Antibiotics Nausea And Vomiting    Review of Systems  Constitutional: Positive for fatigue and unexpected weight change. Negative for fever, chills, diaphoresis, activity change and appetite change.  HENT: Positive for congestion, postnasal drip and sinus pressure.   Respiratory: Positive for cough. Negative for shortness of breath and wheezing.   Cardiovascular: Positive for leg swelling. Negative for chest pain.  Gastrointestinal: Negative for vomiting.  Genitourinary: Negative for vaginal bleeding and menstrual problem.  Musculoskeletal: Positive for arthralgias, back pain, gait problem, joint swelling, myalgias, neck pain and neck stiffness.  Psychiatric/Behavioral: Positive for sleep disturbance and  decreased concentration. Negative for confusion, dysphoric mood and agitation. The patient is nervous/anxious. The patient is not hyperactive.        BP 146/98  Pulse 99  Temp(Src) 98.5 F (36.9 C) (Oral)  Resp 18  Ht 5\' 3"  (1.6 m)  Wt 287 lb 6.4 oz (130.364 kg)  BMI 50.92 kg/m2  SpO2 94% Objective:   Physical Exam  Constitutional: She is oriented to person, place, and time. She appears well-developed and well-nourished. She appears lethargic. She does not appear ill. No distress.  HENT:  Head: Normocephalic and atraumatic.  Right Ear: Tympanic membrane, external ear and ear canal normal. Tympanic membrane is not retracted. No middle ear effusion.  Left Ear: Tympanic membrane, external ear and ear canal normal. Tympanic membrane is not retracted.  No middle ear effusion.  Nose: Mucosal edema and rhinorrhea present.  Mouth/Throat: Uvula is midline and mucous membranes are normal. No trismus in the jaw. No uvula swelling. Posterior oropharyngeal erythema present. No oropharyngeal exudate, posterior oropharyngeal edema or tonsillar abscesses.  Eyes: Conjunctivae are normal. Right eye exhibits no discharge. Left eye exhibits no discharge. No scleral icterus.  Neck: Normal range of motion. Neck supple.  Cardiovascular: Normal rate, regular rhythm, normal heart sounds and intact distal pulses.   Pulmonary/Chest: Effort normal and breath sounds normal.  Lymphadenopathy:       Head (right side): Submandibular adenopathy present. No preauricular and no posterior auricular adenopathy present.       Head (left side): Submandibular adenopathy present. No preauricular and no posterior auricular adenopathy present.    She has no cervical adenopathy.       Right: No supraclavicular adenopathy present.       Left: No supraclavicular adenopathy present.  Neurological: She is oriented to person, place, and time. She appears lethargic. Gait abnormal.  Walks w/ cane.  Skin: Skin is warm and dry. She is  not diaphoretic. No erythema.  Psychiatric: Her speech is normal. Judgment and thought content normal. Her affect is blunt. Cognition and memory are normal.         [facility] reading (PRIMARY) by  Dr. Clelia Croft. CXR: - elevated hemidiaphragm on right?? Lung fields clear, poor inspiration. PFTs - see flow sheet and scanned in; FVC 92% pred, FEV1 88% pred, FEV1/FVC 75.9% Assessment & Plan:  All of pt's meds refilled today but just given 3 mos of rxs of controlled substances of Soma, tramadol, and adderall. Pt will need f/u OV in 3 mos w/ another provider (I will be out on maternity leave) who will hopefully be willing to refills pt's soma, tramadol, and adderall at same doses for an additional 3 mos - make  sure to put date pt can fill rx script on each - and then f/u w/ me for additional refills in 6 mos from now.   ADD (attention deficit disorder) - Plan: amphetamine-dextroamphetamine (ADDERALL) 7.5 MG tablet  BMI 45.0-49.9, adult - Plan: Hemoglobin A1c  Chronic pain - Plan: Comprehensive metabolic panel, carisoprodol (SOMA) 350 MG tablet  Encounter for long-term (current) use of other medications  Fibromyalgia - Plan: Comprehensive metabolic panel, CBC with Differential, carisoprodol (SOMA) 350 MG tablet, citalopram (CELEXA) 10 MG tablet, pregabalin (LYRICA) 150 MG capsule, traMADol (ULTRAM) 50 MG tablet  GERD (gastroesophageal reflux disease) - Plan: esomeprazole (NEXIUM) 40 MG capsule, ranitidine (ZANTAC) 150 MG tablet  Hernia of abdominal wall - watchful waiting - pt doesn't have time to take off work for surgical repair at this point.  HTN (hypertension) - BP consistently not at goal; increase micardis-hctz from 40/12.5 to 80/12.5 - check bmp at f/u due to dose increase.  Hyperlipidemia - Plan: Lipid panel - goal LDL <130 due to HTN and sedentary lifestyle.  Checked last yr and rec pt start on statin as LDL was around 160 but never happened.  ADDENDUM: LDL unchanged from prior - again  encouraged tlc but since failed prior may want to just start statin - however, statin may exacerbate her pre-diabetes and myalgias and 10 yr CVD risk by ATP III guidelines is 2% so discuss risks/benefits w/ pt at her next OV then reconsider statin therapy.  Chronic cough - suspect this is from post-nasal drip and Upper airway cough syndrome - Plan: CBC with Differential, DG Chest 2 View, Spirometry with graph. Is on arb which could be cause but suspect more likely from nasal cong so start daily nasal steroid - see pt instructions. If no improvement could consider short-term trial off of arb to see if cough resolves.  Unspecified hypothyroidism - Plan: TSH - cont levothyroxine 50 mcg qd.  Asthma - Plan: Fluticasone-Salmeterol (ADVAIR) 100-50 MCG/DOSE AEPB, montelukast (SINGULAIR) 10 MG tablet  IBS (irritable bowel syndrome) - Plan: hyoscyamine (LEVBID) 0.375 MG 12 hr tablet  Migraine - Plan: SUMAtriptan (IMITREX) 100 MG tablet  Pre-diabetes - new diagnosis w/ hgba1c 6.1 today - rec low sugar/low carb diet. Offered nutrition referral but declined due to expense and no time.  Hypokalemia - likely due to hctz - was down to 2.8 in past but treated w/ K 20 mEq qd x 1 mo only, and was normal on recheck 6 mos later. Again try K supp x 1 mo only to build back up levels then try to maintain through diet - recheck K at f/u in 3 mos. If low or low-normal again in future, rec starting daily chronic K supp as long as on diuretic.  HM - pt plans to cont to see gyn annually for well woman physicals so mammogram and pelvic UTD. Got flu shot at work 07/13/13.  Meds ordered this encounter  Medications  . amphetamine-dextroamphetamine (ADDERALL) 7.5 MG tablet    Sig: Take 1 tablet (7.5 mg total) by mouth daily. May fill on or after 11/01/13.    Dispense:  30 tablet    Refill:  0  . amphetamine-dextroamphetamine (ADDERALL) 7.5 MG tablet    Sig: Take 1 tablet (7.5 mg total) by mouth daily. May fill on or after  09/02/13    Dispense:  30 tablet    Refill:  0  . amphetamine-dextroamphetamine (ADDERALL) 7.5 MG tablet    Sig: Take 1 tablet (7.5 mg total) by mouth daily.  May fill on or after 10/02/13.    Dispense:  30 tablet    Refill:  0  . carisoprodol (SOMA) 350 MG tablet    Sig: Take 1 tablet (350 mg total) by mouth 4 (four) times daily as needed for muscle spasms.    Dispense:  120 tablet    Refill:  1  . citalopram (CELEXA) 10 MG tablet    Sig: Take 1 tablet (10 mg total) by mouth daily.    Dispense:  90 tablet    Refill:  3  . esomeprazole (NEXIUM) 40 MG capsule    Sig: Take 1 capsule (40 mg total) by mouth daily.    Dispense:  90 capsule    Refill:  3  . Fluticasone-Salmeterol (ADVAIR) 100-50 MCG/DOSE AEPB    Sig: Inhale 1 puff into the lungs 2 (two) times daily.    Dispense:  60 each    Refill:  11  . hyoscyamine (LEVBID) 0.375 MG 12 hr tablet    Sig: Take 1 tablet (0.375 mg total) by mouth every 12 (twelve) hours as needed for cramping.    Dispense:  60 tablet    Refill:  5  . mometasone (NASONEX) 50 MCG/ACT nasal spray    Sig: Place 2 sprays into the nose daily.    Dispense:  17 g    Refill:  11  . montelukast (SINGULAIR) 10 MG tablet    Sig: Take 1 tablet (10 mg total) by mouth at bedtime.    Dispense:  90 tablet    Refill:  3  . pregabalin (LYRICA) 150 MG capsule    Sig: Take 1 capsule (150 mg total) by mouth 3 (three) times daily.    Dispense:  270 capsule    Refill:  1  . ranitidine (ZANTAC) 150 MG tablet    Sig: Take 1 tablet (150 mg total) by mouth 2 (two) times daily.    Dispense:  60 tablet    Refill:  11  . SUMAtriptan (IMITREX) 100 MG tablet    Sig: Take 1 tablet (100 mg total) by mouth every 2 (two) hours as needed for migraine.    Dispense:  4 tablet    Refill:  11  . SYNTHROID 50 MCG tablet    Sig: Take 1 tablet (50 mcg total) by mouth daily before breakfast.    Dispense:  90 tablet    Refill:  1  . traMADol (ULTRAM) 50 MG tablet    Sig: Take 1 tablet  (50 mg total) by mouth 4 (four) times daily. Needs office visit for more    Dispense:  120 tablet    Refill:  2  . telmisartan-hydrochlorothiazide (MICARDIS HCT) 80-12.5 MG per tablet    Sig: Take 1 tablet by mouth daily.    Dispense:  90 tablet    Refill:  1    Over 40 minutes spend in eval, review, and assessment of pt.  Norberto Sorenson, MD MPH

## 2013-09-08 DIAGNOSIS — R7303 Prediabetes: Secondary | ICD-10-CM | POA: Insufficient documentation

## 2013-09-08 MED ORDER — POTASSIUM CHLORIDE CRYS ER 20 MEQ PO TBCR
20.0000 meq | EXTENDED_RELEASE_TABLET | Freq: Every day | ORAL | Status: DC
Start: 2013-09-08 — End: 2014-02-15

## 2013-09-10 ENCOUNTER — Telehealth: Payer: Self-pay | Admitting: Radiology

## 2013-09-10 NOTE — Telephone Encounter (Signed)
error 

## 2013-09-25 ENCOUNTER — Telehealth: Payer: Self-pay

## 2013-09-25 NOTE — Telephone Encounter (Signed)
Pharm sent requ for RF of Soma. I called pharm to see if they received the Rx phoned in on 08/31/13. He stated they did not and I gave info on the phone.

## 2013-09-28 ENCOUNTER — Encounter: Payer: Self-pay | Admitting: Family Medicine

## 2013-10-23 ENCOUNTER — Telehealth: Payer: Self-pay

## 2013-10-23 NOTE — Telephone Encounter (Signed)
Completed PA for Lyrica covermymeds.

## 2013-10-24 NOTE — Telephone Encounter (Signed)
Received notice that PA is not required for Lyrica. Faxed response to pharm to contact [facility] help line.

## 2013-11-14 ENCOUNTER — Ambulatory Visit (INDEPENDENT_AMBULATORY_CARE_PROVIDER_SITE_OTHER): Payer: 59 | Admitting: Physician Assistant

## 2013-11-14 ENCOUNTER — Encounter: Payer: Self-pay | Admitting: Physician Assistant

## 2013-11-14 VITALS — BP 138/87 | HR 94 | Temp 98.5°F | Resp 18 | Ht 63.0 in | Wt 292.0 lb

## 2013-11-14 DIAGNOSIS — M797 Fibromyalgia: Secondary | ICD-10-CM

## 2013-11-14 DIAGNOSIS — F988 Other specified behavioral and emotional disorders with onset usually occurring in childhood and adolescence: Secondary | ICD-10-CM

## 2013-11-14 DIAGNOSIS — G8929 Other chronic pain: Secondary | ICD-10-CM

## 2013-11-14 DIAGNOSIS — E876 Hypokalemia: Secondary | ICD-10-CM

## 2013-11-14 DIAGNOSIS — IMO0001 Reserved for inherently not codable concepts without codable children: Secondary | ICD-10-CM

## 2013-11-14 LAB — COMPLETE METABOLIC PANEL WITH GFR
ALT: 26 U/L (ref 0–35)
AST: 22 U/L (ref 0–37)
Albumin: 4 g/dL (ref 3.5–5.2)
Alkaline Phosphatase: 113 U/L (ref 39–117)
BUN: 12 mg/dL (ref 6–23)
CO2: 30 meq/L (ref 19–32)
CREATININE: 0.75 mg/dL (ref 0.50–1.10)
Calcium: 9.5 mg/dL (ref 8.4–10.5)
Chloride: 101 mEq/L (ref 96–112)
Glucose, Bld: 100 mg/dL — ABNORMAL HIGH (ref 70–99)
Potassium: 3.5 mEq/L (ref 3.5–5.3)
Sodium: 139 mEq/L (ref 135–145)
Total Bilirubin: 0.4 mg/dL (ref 0.2–1.2)
Total Protein: 7.2 g/dL (ref 6.0–8.3)

## 2013-11-14 MED ORDER — TRAMADOL HCL 50 MG PO TABS: 50.0000 mg | ORAL_TABLET | Freq: Four times a day (QID) | ORAL | Status: DC

## 2013-11-14 MED ORDER — CARISOPRODOL 350 MG PO TABS: 350.0000 mg | ORAL_TABLET | Freq: Four times a day (QID) | ORAL | Status: DC | PRN

## 2013-11-14 NOTE — Patient Instructions (Signed)
Recheck with Dr Clelia CroftShaw in 3 months -- call when you need a refill of your Ultram and Adderall

## 2013-11-15 NOTE — Progress Notes (Signed)
   Subjective:    Patient ID: Gloria GrovesBarbara Carter, female    DOB: 11-25-1965, 48 y.o.   MRN: [id number]  HPI  Pt presents to clinic with for med refill.  She has not been taking her KCl because the pill is to big and it gets stuck in her throat and she has not taken it since her last visit.  She is about the run out of her Ultram and Soma but she still has some Adderall left.  She is doing well.  She is monitoring her calorie intake but has not changed her food intake just watching it.  Review of Systems     Objective:   Physical Exam  Constitutional: She is oriented to person, place, and time. She appears well-developed and well-nourished.  HENT:  Head: Normocephalic and atraumatic.  Right Ear: External ear normal.  Left Ear: External ear normal.  Pulmonary/Chest: Effort normal.  Neurological: She is alert and oriented to person, place, and time.  Psychiatric: She has a normal mood and affect. Her behavior is normal.        Assessment & Plan:  ADD (attention deficit disorder) -- she will call when she needs this refilled  Fibromyalgia - We will refill these medications -- Plan: traMADol (ULTRAM) 50 MG tablet, carisoprodol (SOMA) 350 MG tablet  Chronic pain - Plan: carisoprodol (SOMA) 350 MG tablet  Hypokalemia - Plan: COMPLETE METABOLIC PANEL WITH GFR -- will check her potassium levels and if they are low we will try the effervescent tablets that she can dissolve and drink.  I discussed with pt that I do not write refills on Ultram or multiple Rx for Adderall at a time.  She will make an appt with Dr Clelia CroftShaw for when she returns and I will refill these monthly for her until that appt.  She voiced understanding.  Benny LennertSarah Dazia Lippold PA-C 11/15/2013 5:34 PM

## 2013-11-23 ENCOUNTER — Telehealth: Payer: Self-pay | Admitting: *Deleted

## 2013-11-23 DIAGNOSIS — K439 Ventral hernia without obstruction or gangrene: Secondary | ICD-10-CM

## 2013-11-23 NOTE — Telephone Encounter (Signed)
This patient emailed us about us sending her to a surgeon about a hernia.  She stated that she discussed this at her last visit and also said something about this in November.  Can we refer her?

## 2013-11-26 NOTE — Telephone Encounter (Signed)
Patient advised surgeons office will call with appointment

## 2013-11-26 NOTE — Telephone Encounter (Signed)
Done.  She should receive a call from them about an appt.

## 2013-12-11 ENCOUNTER — Telehealth: Payer: Self-pay | Admitting: *Deleted

## 2013-12-11 DIAGNOSIS — K439 Ventral hernia without obstruction or gangrene: Secondary | ICD-10-CM

## 2013-12-11 NOTE — Telephone Encounter (Signed)
Message copied by Blima LedgerICHARDSON, Velita Quirk D on Tue Dec 11, 2013  2:13 PM ------      Message from: Zacarias PontesWYRICK, JANICE      Created: Tue Dec 11, 2013 12:57 PM       Has pt had an work up of any kind?..u/s ,mri ?? [facility] will need before pt comes in.Marland Kitchen.Marland Kitchen.Thanks Liborio NixonJanice  ------

## 2013-12-11 NOTE — Telephone Encounter (Signed)
Do we need to work her up first.

## 2013-12-12 NOTE — Telephone Encounter (Signed)
This is a patient of Dr Clelia CroftShaw that I saw in her absence.  I did not do a work-up and do not see that an imaging test was ordered at any time.

## 2013-12-12 NOTE — Telephone Encounter (Signed)
I will order the US and when I get the results if the hernia is present we will do the referral to general surgery.  Will you please let the patient know about the change in plans.  Thanks

## 2013-12-12 NOTE — Telephone Encounter (Signed)
Looks like she will need one per the referral note before [facility] can evaluate her (see referral note)

## 2013-12-12 NOTE — Telephone Encounter (Signed)
Can we order an u/s on this pt

## 2013-12-13 NOTE — Telephone Encounter (Signed)
Pt advised.

## 2013-12-18 ENCOUNTER — Other Ambulatory Visit: Payer: Self-pay

## 2013-12-18 DIAGNOSIS — M797 Fibromyalgia: Secondary | ICD-10-CM

## 2013-12-18 MED ORDER — TRAMADOL HCL 50 MG PO TABS: 50.0000 mg | ORAL_TABLET | Freq: Four times a day (QID) | ORAL | Status: DC

## 2013-12-18 NOTE — Telephone Encounter (Signed)
Ready

## 2013-12-18 NOTE — Telephone Encounter (Signed)
faxed

## 2013-12-18 NOTE — Telephone Encounter (Signed)
Pharm requests RF of tramadol

## 2014-01-10 ENCOUNTER — Ambulatory Visit
Admission: RE | Admit: 2014-01-10 | Discharge: 2014-01-10 | Disposition: A | Discharge status: Home / Self Care | Payer: 59 | Source: Ambulatory Visit | Attending: Physician Assistant | Admitting: Physician Assistant

## 2014-01-10 DIAGNOSIS — K439 Ventral hernia without obstruction or gangrene: Secondary | ICD-10-CM

## 2014-01-11 ENCOUNTER — Other Ambulatory Visit: Payer: Self-pay | Admitting: Physician Assistant

## 2014-01-11 DIAGNOSIS — K429 Umbilical hernia without obstruction or gangrene: Secondary | ICD-10-CM

## 2014-01-24 ENCOUNTER — Other Ambulatory Visit: Payer: Self-pay

## 2014-01-24 DIAGNOSIS — M797 Fibromyalgia: Secondary | ICD-10-CM

## 2014-01-24 NOTE — Telephone Encounter (Signed)
Rec fax RX request.

## 2014-01-28 ENCOUNTER — Other Ambulatory Visit: Payer: Self-pay | Admitting: Physician Assistant

## 2014-01-28 ENCOUNTER — Encounter (INDEPENDENT_AMBULATORY_CARE_PROVIDER_SITE_OTHER): Payer: Self-pay | Admitting: Surgery

## 2014-01-28 ENCOUNTER — Ambulatory Visit (INDEPENDENT_AMBULATORY_CARE_PROVIDER_SITE_OTHER): Payer: 59 | Admitting: Surgery

## 2014-01-28 VITALS — BP 138/84 | HR 76 | Temp 97.6°F | Ht 64.0 in | Wt 297.8 lb

## 2014-01-28 DIAGNOSIS — M797 Fibromyalgia: Secondary | ICD-10-CM

## 2014-01-28 DIAGNOSIS — K439 Ventral hernia without obstruction or gangrene: Secondary | ICD-10-CM

## 2014-01-28 MED ORDER — TRAMADOL HCL 50 MG PO TABS: 50.0000 mg | ORAL_TABLET | Freq: Four times a day (QID) | ORAL | Status: DC

## 2014-01-28 NOTE — Patient Instructions (Signed)
[facility]  [facility] REPAIR POST OP INSTRUCTIONS  Always review your discharge instruction sheet given to you by the facility where your surgery was performed.  1. A  prescription for pain medication may be given to you upon discharge.  Take your pain medication as prescribed.  If narcotic pain medicine is not needed, then you may take acetaminophen (Tylenol) or ibuprofen (Advil) as needed.  2. Take your usually prescribed medications unless otherwise directed.  3. If you need a refill on your pain medication, please contact your pharmacy.  They will contact our office to request authorization. Prescriptions will not be filled after 5 pm daily or on weekends.  4. You should follow a light diet the first 24 hours after arrival home, such as soup and crackers or toast.  Be sure to include plenty of fluids daily.  Resume your normal diet the day after surgery.  5. Most patients will experience some swelling and bruising around the surgical site.  Ice packs and reclining will help.  Swelling and bruising can take several days to resolve.   6. It is common to experience some constipation if taking pain medication after surgery.  Increasing fluid intake and taking a stool softener (such as Colace) will usually help or prevent this problem from occurring.  A mild laxative (Milk of Magnesia or Miralax) should be taken according to package directions if there are no bowel movements after 48 hours.  7. Unless discharge instructions indicate otherwise, you may remove your bandages 24-48 hours after surgery, and you may shower at that time.  You may have steri-strips (small skin tapes) in place directly over the incision.  These strips should be left on the skin for 7-10 days.  If your surgeon used skin glue on the incision, you may shower in 24 hours.  The glue will flake off over the next 2-3 weeks.  Any sutures or staples will be removed at the office during your follow-up  visit.  8. ACTIVITIES:  You may resume regular (light) daily activities beginning the next day-such as daily self-care, walking, climbing stairs-gradually increasing activities as tolerated.  You may have sexual intercourse when it is comfortable.  Refrain from any heavy lifting or straining until approved by your doctor.  You may drive when you are no longer taking prescription pain medication, you can comfortably wear a seatbelt, and you can safely maneuver your car and apply brakes.  9. You should see your doctor in the office for a follow-up appointment approximately 2-3 weeks after your surgery.  Make sure that you call for this appointment within a day or two after you arrive home to insure a convenient appointment time. 10.   WHEN TO CALL YOUR DOCTOR: 1. Fever greater than 101.0 2. Inability to urinate 3. Persistent nausea and/or vomiting 4. Extreme swelling or bruising 5. Continued bleeding from incision 6. Increased pain, redness, or drainage from the incision  The clinic staff is available to answer your questions during regular business hours.  Please don't hesitate to call and ask to speak to one of the nurses for clinical concerns.  If you have a medical emergency, go to the nearest emergency room or call 911.  A surgeon from [facility] is always on call for the hospital.   [facility] [address], [address], [place], [place]  [address]  [phone] ? [phone] ? [phone] [phone]  [email or web address]   

## 2014-01-28 NOTE — Telephone Encounter (Signed)
Received request for refill. 

## 2014-01-28 NOTE — Telephone Encounter (Signed)
Faxed and pt notified.

## 2014-01-28 NOTE — Telephone Encounter (Signed)
Pt came into 102 to check on this req. It looks like it was not routed properly to Dr Alver FisherShaw's box when it was pended ([employer] first day helping). Please review.

## 2014-01-28 NOTE — Progress Notes (Signed)
General Surgery [facility]  Chief Complaint  Patient presents with  . New Evaluation    abdominal wall hernia - referral from Dr. Norberto SorensonEva Shaw and Benny LennertSarah Weber, PA-C    HISTORY: Patient is a 48 year old female who presents on referral from her primary care physician for evaluation of an incarcerated abdominal wall hernia. Patient first noted her hernia approximately 2 years ago. She suffers from a chronic cough. She developed a bulge which has gradually enlarged. Initially it was reducible but has not been reducible in several months. She denies any signs or symptoms of obstruction. She denies any pain. She has had no prior abdominal surgery.  Ultrasound examination was performed on 01/10/2014. This shows approximately a 16 cm hernia sac containing adipose tissue and possibly a single loop of small intestine.  Past Medical History  Diagnosis Date  . Asthma   . Dysphagia   . Fibromyalgia   . Migraine   . GERD (gastroesophageal reflux disease)   . ADD (attention deficit disorder)   . Hernia of abdominal wall   . Allergy   . Anemia   . Hypertension   . Thyroid disease   . Fibromyalgia   . Arthritis     Current Outpatient Prescriptions  Medication Sig Dispense Refill  . albuterol (PROVENTIL HFA;VENTOLIN HFA) 108 (90 BASE) MCG/ACT inhaler Inhale 2 puffs into the lungs every 6 (six) hours as needed.  1 Inhaler  1  . amphetamine-dextroamphetamine (ADDERALL) 7.5 MG tablet Take 1 tablet (7.5 mg total) by mouth daily. May fill on or after 11/01/13.  30 tablet  0  . amphetamine-dextroamphetamine (ADDERALL) 7.5 MG tablet Take 1 tablet (7.5 mg total) by mouth daily. May fill on or after 09/02/13  30 tablet  0  . amphetamine-dextroamphetamine (ADDERALL) 7.5 MG tablet Take 1 tablet (7.5 mg total) by mouth daily. May fill on or after 10/02/13.  30 tablet  0  . Ascorbic Acid (VITAMIN C) 100 MG tablet Take 100 mg by mouth daily.      . B Complex-C-Folic Acid (B-COMPLEX BALANCED PO)  Take by mouth 1 day or 1 dose.      . calcium carbonate (OS-CAL) 600 MG TABS Take 600 mg by mouth daily.      . carisoprodol (SOMA) 350 MG tablet Take 1 tablet (350 mg total) by mouth 4 (four) times daily as needed for muscle spasms.  120 tablet  2  . cetirizine (ZYRTEC) 10 MG tablet Take 10 mg by mouth as needed.      . cholecalciferol (VITAMIN D) 400 UNITS TABS Take 400 Units by mouth daily.      . citalopram (CELEXA) 10 MG tablet Take 1 tablet (10 mg total) by mouth daily.  90 tablet  3  . esomeprazole (NEXIUM) 40 MG capsule Take 1 capsule (40 mg total) by mouth daily.  90 capsule  3  . Fluticasone-Salmeterol (ADVAIR) 100-50 MCG/DOSE AEPB Inhale 1 puff into the lungs 2 (two) times daily.  60 each  11  . guaiFENesin (MUCINEX) 600 MG 12 hr tablet Take 1,200 mg by mouth as needed.      . hyoscyamine (LEVBID) 0.375 MG 12 hr tablet Take 1 tablet (0.375 mg total) by mouth every 12 (twelve) hours as needed for cramping.  60 tablet  5  . ibuprofen (ADVIL,MOTRIN) 200 MG tablet Take 200 mg by mouth as needed.      . mometasone (NASONEX) 50 MCG/ACT nasal spray Place 2 sprays into the nose daily.  17 g  11  . montelukast (SINGULAIR) 10 MG tablet Take 1 tablet (10 mg total) by mouth at bedtime.  90 tablet  3  . Multiple Vitamin (MULTIVITAMIN) tablet Take 1 tablet by mouth daily.      . potassium chloride SA (K-DUR,KLOR-CON) 20 MEQ tablet Take 1 tablet (20 mEq total) by mouth daily.  30 tablet  0  . pregabalin (LYRICA) 150 MG capsule Take 1 capsule (150 mg total) by mouth 3 (three) times daily.  270 capsule  1  . ranitidine (ZANTAC) 150 MG tablet Take 1 tablet (150 mg total) by mouth 2 (two) times daily.  60 tablet  11  . Red Yeast Rice 600 MG CAPS Take 600 mg by mouth daily.      . SUMAtriptan (IMITREX) 100 MG tablet Take 1 tablet (100 mg total) by mouth every 2 (two) hours as needed for migraine.  4 tablet  11  . SYNTHROID 50 MCG tablet Take 1 tablet (50 mcg total) by mouth daily before breakfast.  90  tablet  1  . telmisartan-hydrochlorothiazide (MICARDIS HCT) 80-12.5 MG per tablet Take 1 tablet by mouth daily.  90 tablet  1  . traMADol (ULTRAM) 50 MG tablet Take 1 tablet (50 mg total) by mouth 4 (four) times daily.  120 tablet  0  . [DISCONTINUED] hyoscyamine (CYSTOSPAZ) 0.15 MG tablet Take 0.15 mg by mouth every 4 (four) hours as needed.       No current facility-administered medications for this visit.    Allergies  Allergen Reactions  . Codeine Nausea Only    Can tolerate hydromet and tussionex  . Sulfa Antibiotics Nausea And Vomiting    Family History  Problem Relation Age of Onset  . Colon cancer    . COPD Mother     History   Social History  . Marital Status: Married    Spouse Name: N/A    Number of Children: N/A  . Years of Education: N/A   Occupational History  . [place] [employer]   Social History Main Topics  . Smoking status: Never Smoker   . Smokeless tobacco: None  . Alcohol Use: No  . Drug Use: No  . Sexual Activity: None   Other Topics Concern  . None   Social History Narrative   Married. Education: [employer].     REVIEW OF SYSTEMS - PERTINENT POSITIVES ONLY: Denies signs or symptoms of obstruction. Denies pain. Denies changes in bowel habits. Not reducible.  EXAM: Filed Vitals:   01/28/14 1417  BP: 138/84  Pulse: 76  Temp: 97.6 F (36.4 C)    GENERAL: well-developed, well-nourished, no acute distress HEENT: normocephalic; pupils equal and reactive; sclerae clear; dentition good; mucous membranes moist NECK:  No palpable masses in the thyroid bed; symmetric on extension; no palpable anterior or posterior cervical lymphadenopathy; no supraclavicular masses; no tenderness CHEST: clear to auscultation bilaterally without rales, rhonchi, or wheezes CARDIAC: regular rate and rhythm without significant murmur; peripheral pulses are full ABDOMEN: soft without distension; bowel sounds present; no mass; no hepatosplenomegaly; no  incisions; palpable bulge in the midline above the level of the umbilicus consistent with ventral hernia, not reducible, mildly tender; umbilicus appears grossly normal EXT:  non-tender without edema; no deformity NEURO: no gross focal deficits; no sign of tremor   LABORATORY RESULTS: See [facility] ([facility]L-Epic) for most recent results  RADIOLOGY RESULTS: See [facility] (CHL-Epic) for most recent results  IMPRESSION: Incarcerated ventral hernia  PLAN: I discussed with  the patient and her family the indications for repair. We reviewed the ultrasound report. I provided them with written literature to review at home.  Patient has an incarcerated ventral hernia which likely includes a loop of bowel. I think she would be a excellent candidate for laparoscopic ventral hernia repair with mesh. She would likely require a 2 day hospital stay. I have told her to plan on 3 weeks out of work. She will need to wear an abdominal binder and have a lifting restriction for 6 weeks. We discussed the possible risk of recurrence being 5-10%. Patient understands and wishes to proceed with surgery.  The risks and benefits of the procedure have been discussed at length with the patient.  The patient understands the proposed procedure, potential alternative treatments, and the course of recovery to be expected.  All of the patient's questions have been answered at this time.  The patient wishes to proceed with surgery.  Velora Heckler, MD, FACS General & Endocrine Surgery [facility]  Primary Care Physician: Norberto Sorenson, MD

## 2014-02-15 ENCOUNTER — Encounter: Payer: Self-pay | Admitting: Family Medicine

## 2014-02-15 ENCOUNTER — Ambulatory Visit (INDEPENDENT_AMBULATORY_CARE_PROVIDER_SITE_OTHER): Payer: 59 | Admitting: Family Medicine

## 2014-02-15 VITALS — BP 130/94 | HR 101 | Temp 98.6°F | Resp 16 | Ht 63.0 in | Wt 299.0 lb

## 2014-02-15 DIAGNOSIS — K439 Ventral hernia without obstruction or gangrene: Secondary | ICD-10-CM

## 2014-02-15 DIAGNOSIS — M797 Fibromyalgia: Secondary | ICD-10-CM

## 2014-02-15 DIAGNOSIS — G8929 Other chronic pain: Secondary | ICD-10-CM

## 2014-02-15 DIAGNOSIS — G43909 Migraine, unspecified, not intractable, without status migrainosus: Secondary | ICD-10-CM

## 2014-02-15 DIAGNOSIS — F988 Other specified behavioral and emotional disorders with onset usually occurring in childhood and adolescence: Secondary | ICD-10-CM

## 2014-02-15 DIAGNOSIS — R7309 Other abnormal glucose: Secondary | ICD-10-CM

## 2014-02-15 DIAGNOSIS — K589 Irritable bowel syndrome without diarrhea: Secondary | ICD-10-CM

## 2014-02-15 DIAGNOSIS — R7303 Prediabetes: Secondary | ICD-10-CM

## 2014-02-15 DIAGNOSIS — E876 Hypokalemia: Secondary | ICD-10-CM

## 2014-02-15 DIAGNOSIS — IMO0001 Reserved for inherently not codable concepts without codable children: Secondary | ICD-10-CM

## 2014-02-15 MED ORDER — CARISOPRODOL 350 MG PO TABS: 350.0000 mg | ORAL_TABLET | Freq: Four times a day (QID) | ORAL | Status: DC | PRN

## 2014-02-15 MED ORDER — SYNTHROID 50 MCG PO TABS: 50.0000 ug | ORAL_TABLET | Freq: Every day | ORAL | Status: DC

## 2014-02-15 MED ORDER — SUMATRIPTAN SUCCINATE 100 MG PO TABS: 100.0000 mg | ORAL_TABLET | ORAL | Status: DC | PRN

## 2014-02-15 MED ORDER — TELMISARTAN-HCTZ 80-12.5 MG PO TABS: 1.0000 | ORAL_TABLET | Freq: Every day | ORAL | Status: DC

## 2014-02-15 MED ORDER — AMPHETAMINE-DEXTROAMPHETAMINE 7.5 MG PO TABS: 7.5000 mg | ORAL_TABLET | Freq: Every day | ORAL | Status: DC

## 2014-02-15 MED ORDER — PREGABALIN 150 MG PO CAPS: 150.0000 mg | ORAL_CAPSULE | Freq: Three times a day (TID) | ORAL | Status: DC

## 2014-02-15 MED ORDER — TRAMADOL HCL 50 MG PO TABS: 50.0000 mg | ORAL_TABLET | Freq: Four times a day (QID) | ORAL | Status: DC

## 2014-02-15 NOTE — Progress Notes (Signed)
Subjective:    Patient ID: Gloria Carter, female    DOB: [birth year], 48 y.o.   MRN: [id number] Chief Complaint  Patient presents with  . Medication Refill    HPI  Is planning to undergo hernia surgery soon. Will be out of work for at least 6 wks recovering likely which will probably be good for her mentally as well.  Past Medical History  Diagnosis Date  . Asthma   . Dysphagia   . Fibromyalgia   . Migraine   . GERD (gastroesophageal reflux disease)   . ADD (attention deficit disorder)   . Hernia of abdominal wall   . Allergy     ENVIRONMENTAL  . Hypertension   . Thyroid disease   . Fibromyalgia   . Numbness and tingling in hands   . Arthritis     knees  . IBS (irritable bowel syndrome)   . Difficulty sleeping   . Depression   . Swelling of both ankles    Current Outpatient Prescriptions on File Prior to Visit  Medication Sig Dispense Refill  . B Complex-C-Folic Acid (B-COMPLEX BALANCED PO) Take 1 tablet by mouth daily.       . calcium carbonate (OS-CAL) 600 MG TABS Take 600 mg by mouth daily.      . cetirizine (ZYRTEC) 10 MG tablet Take 10 mg by mouth daily as needed for allergies.       . cholecalciferol (VITAMIN D) 400 UNITS TABS Take 400 Units by mouth daily.      Marland Kitchen. esomeprazole (NEXIUM) 40 MG capsule Take 1 capsule (40 mg total) by mouth daily.  90 capsule  3  . Fluticasone-Salmeterol (ADVAIR) 100-50 MCG/DOSE AEPB Inhale 1 puff into the lungs 2 (two) times daily.  60 each  11  . guaiFENesin (MUCINEX) 600 MG 12 hr tablet Take 1,200 mg by mouth daily as needed for cough.       Marland Kitchen. ibuprofen (ADVIL,MOTRIN) 200 MG tablet Take 400-600 mg by mouth every 8 (eight) hours as needed for moderate pain.       . mometasone (NASONEX) 50 MCG/ACT nasal spray Place 2 sprays into the nose daily.  17 g  11  . Multiple Vitamin (MULTIVITAMIN) tablet Take 1 tablet by mouth daily.      . Red Yeast Rice 600 MG CAPS Take 600 mg by mouth daily.      . hyoscyamine (LEVBID) 0.375 MG 12 hr  tablet Take 1 tablet (0.375 mg total) by mouth every 12 (twelve) hours as needed for cramping.  60 tablet  5  . [DISCONTINUED] hyoscyamine (CYSTOSPAZ) 0.15 MG tablet Take 0.15 mg by mouth every 4 (four) hours as needed.       No current facility-administered medications on file prior to visit.   Allergies  Allergen Reactions  . Codeine Nausea Only    Can tolerate hydromet and tussionex  . Sulfa Antibiotics Nausea And Vomiting    Review of Systems  Constitutional: Positive for fatigue. Negative for fever, chills, diaphoresis, activity change, appetite change and unexpected weight change.  Eyes: Negative for visual disturbance.  Respiratory: Positive for chest tightness. Negative for cough and shortness of breath.   Cardiovascular: Positive for chest pain and leg swelling. Negative for palpitations.  Gastrointestinal: Positive for abdominal pain and abdominal distention.  Genitourinary: Negative for decreased urine volume.  Musculoskeletal: Positive for arthralgias, back pain, gait problem, joint swelling and myalgias.  Neurological: Negative for syncope and headaches.  Hematological: Negative for adenopathy. Does not bruise/bleed easily.  BP 130/94  Pulse 101  Temp(Src) 98.6 F (37 C) (Oral)  Resp 16  Ht 5\' 3"  (1.6 m)  Wt 299 lb (135.626 kg)  BMI 52.98 kg/m2  SpO2 97% Objective:   Physical Exam  Constitutional: She is oriented to person, place, and time. She appears well-developed and well-nourished. No distress.  HENT:  Head: Normocephalic and atraumatic.  Right Ear: External ear normal.  Left Ear: External ear normal.  Eyes: Conjunctivae are normal. No scleral icterus.  Neck: Normal range of motion. Neck supple. No thyromegaly present.  Cardiovascular: Normal rate, regular rhythm, normal heart sounds and intact distal pulses.   Pulmonary/Chest: Effort normal and breath sounds normal. No respiratory distress.  Musculoskeletal: She exhibits no edema.  Lymphadenopathy:      She has no cervical adenopathy.  Neurological: She is alert and oriented to person, place, and time.  Skin: Skin is warm and dry. She is not diaphoretic. No erythema.  Psychiatric: She has a normal mood and affect. Her behavior is normal.          Assessment & Plan:   Fibromyalgia - Plan: traMADol (ULTRAM) 50 MG tablet, pregabalin (LYRICA) 150 MG capsule, carisoprodol (SOMA) 350 MG tablet  Migraine - Plan: SUMAtriptan (IMITREX) 100 MG tablet  IBS (irritable bowel syndrome) - Plan: Amb ref to Medical Nutrition Therapy-MNT  Chronic pain - Plan: carisoprodol (SOMA) 350 MG tablet  ADD (attention deficit disorder) - Plan: amphetamine-dextroamphetamine (ADDERALL) 7.5 MG tablet  Hypokalemia - Plan: Amb ref to Medical Nutrition Therapy-MNT - pt declines check, try to work on diet, will check before surg and check at next OV  Prediabetes - Plan: Amb ref to Medical Nutrition Therapy-MNT - at next OV  Hernia of abdominal wall  Obesity, morbid, BMI 50 or higher - Plan: Amb ref to Medical Nutrition Therapy-MNT  Meds ordered this encounter  Medications  . traMADol (ULTRAM) 50 MG tablet    Sig: Take 1 tablet (50 mg total) by mouth 4 (four) times daily.    Dispense:  120 tablet    Refill:  2    Order Specific Question:  Supervising Provider    Answer:  DOOLITTLE, ROBERT P [[id number]]  . telmisartan-hydrochlorothiazide (MICARDIS HCT) 80-12.5 MG per tablet    Sig: Take 1 tablet by mouth daily.    Dispense:  90 tablet    Refill:  1  . SYNTHROID 50 MCG tablet    Sig: Take 1 tablet (50 mcg total) by mouth daily before breakfast.    Dispense:  90 tablet    Refill:  1  . SUMAtriptan (IMITREX) 100 MG tablet    Sig: Take 1 tablet (100 mg total) by mouth every 2 (two) hours as needed for migraine.    Dispense:  4 tablet    Refill:  11  . pregabalin (LYRICA) 150 MG capsule    Sig: Take 1 capsule (150 mg total) by mouth 3 (three) times daily.    Dispense:  270 capsule    Refill:  1  .  carisoprodol (SOMA) 350 MG tablet    Sig: Take 1 tablet (350 mg total) by mouth 4 (four) times daily as needed for muscle spasms.    Dispense:  120 tablet    Refill:  2    Order Specific Question:  Supervising Provider    Answer:  DOOLITTLE, ROBERT P [[id number]]  . amphetamine-dextroamphetamine (ADDERALL) 7.5 MG tablet    Sig: Take 1 tablet (7.5 mg total) by mouth daily. May fill on or  after 02/15/14.    Dispense:  30 tablet    Refill:  0  . amphetamine-dextroamphetamine (ADDERALL) 7.5 MG tablet    Sig: Take 1 tablet (7.5 mg total) by mouth daily. May fill on or after 03/18/2014    Dispense:  30 tablet    Refill:  0  . amphetamine-dextroamphetamine (ADDERALL) 7.5 MG tablet    Sig: Take 1 tablet (7.5 mg total) by mouth daily. May fill on or after 04/17/14    Dispense:  30 tablet    Refill:  0    Norberto SorensonEva Shaw, MD MPH  \

## 2014-02-15 NOTE — Patient Instructions (Signed)
Potassium Content of Foods Potassium is a mineral found in many foods and drinks. It helps keep fluids and minerals balanced in your body and also affects how steadily your heart beats. The body needs potassium to control blood pressure and to keep the muscles and nervous system healthy. However, certain health conditions and medicine may require you to eat more or less potassium-rich foods and drinks. Your caregiver or dietitian will tell you how much potassium you should have each day. COMMON SERVING SIZES The list below tells you how big or small common portion sizes are:  1 oz.........4 stacked dice.  3 oz.........Deck of cards.  1 tsp........Tip of little finger.  1 tbsp......Thumb.  2 tbsp......Golf ball.   c...........Half of a fist.  1 c............A fist. FOODS AND DRINKS HIGH IN POTASSIUM More than 200 mg of potassium per serving. A serving size is  c (120 mL or noted gram weight) unless otherwise stated. While all the items on this list are high in potassium, some items are higher in potassium than others. Fruits  Apricots (sliced), 83 g.  Apricots (dried halves), 3 oz / 24 g.  Avocado (cubed),  c / 50 g.  Banana (sliced), 75 g.  Cantaloupe (cubed), 80 g.  Dates (pitted), 5 whole / 35 g.  Figs (dried), 4 whole / 32 g.  Guava, c / 55 g.  Honeydew, 1 wedge / 85 g.  Kiwi (sliced), 90 g.  Nectarine, 1 small / 129 g.  Orange, 1 medium / 131 g.  Orange juice.  Pomegranate seeds, 87 g.  Pomegranate juice.  Prunes (pitted), 3 whole / 30 g.  Prune juice, 3 oz / 90 mL.  Seedless raisins, 3 tbsp / 27 g. Vegetables  Artichoke,  of a medium / 64 g.  Asparagus (boiled), 90 g.  Baked beans,  c / 63 g.  Bamboo shoots,  c / 38 g.  Beets (cooked slices), 85 g.  Broccoli (boiled), 78 g.  Brussels sprout (boiled), 78 g.  Butternut squash (baked), 103 g.  Chickpea (cooked), 82 g.  Green peas (cooked), 80 g.  Hubbard squash (baked cubes),  c /  68 g.  Kidney beans (cooked), 5 tbsp / 55 g.  Lima beans (cooked),  c / 43 g.  Navy beans (cooked),  c / 61 g.  Potato (baked), 61 g.  Potato (boiled), 78 g.  Pumpkin (boiled), 123 g.  Refried beans,  c / 79 g.  Spinach (cooked),  c / 45 g.  Split peas (cooked),  c / 65 g.  Sun-dried tomatoes, 2 tbsp / 7 g.  Sweet potato (baked),  c / 50 g.  Tomato (chopped or sliced), 90 g.  Tomato juice.  Tomato paste, 4 tsp / 21 g.  Tomato sauce,  c / 61 g.  Vegetable juice.  White mushrooms (cooked), 78 g.  Yam (cooked or baked),  c / 34 g.  Zucchini squash (boiled), 90 g. Other Foods and Drinks  Almonds (whole),  c / 36 g.  Cashews (oil roasted),  c / 32 g.  Chocolate milk.  Chocolate pudding, 142 g.  Clams (steamed), 1.5 oz / 43 g.  Dark chocolate, 1.5 oz / 42 g.  Fish, 3 oz / 85 g.  King crab (steamed), 3 oz / 85 g.  Lobster (steamed), 4 oz / 113 g.  Milk (skim, 1%, 2%, whole), 1 c / 240 mL.  Milk chocolate, 2.3 oz / 66 g.  Milk shake.  Nonfat fruit   variety yogurt, 123 g.  Peanuts (oil roasted), 1 oz / 28 g.  Peanut butter, 2 tbsp / 32 g.  Pistachio nuts, 1 oz / 28 g.  Pumpkin seeds, 1 oz / 28 g.  Red meat (broiled, cooked, grilled), 3 oz / 85 g.  Scallops (steamed), 3 oz / 85 g.  Shredded wheat cereal (dry), 3 oblong biscuits / 75 g.  Spaghetti sauce,  c / 66 g.  Sunflower seeds (dry roasted), 1 oz / 28 g.  Veggie burger, 1 patty / 70 g. FOODS MODERATE IN POTASSIUM Between 150 mg and 200 mg per serving. A serving is  c (120 mL or noted gram weight) unless otherwise stated. Fruits  Grapefruit,  of the fruit / 123 g.  Grapefruit juice.  Pineapple juice.  Plums (sliced), 83 g.  Tangerine, 1 large / 120 g. Vegetables  Carrots (boiled), 78 g.  Carrots (sliced), 61 g.  Rhubarb (cooked with sugar), 120 g.  Rutabaga (cooked), 120 g.  Sweet corn (cooked), 75 g.  Yellow snap beans (cooked), 63 g. Other Foods and  Drinks   Bagel, 1 bagel / 98 g.  Chicken breast (roasted and chopped),  c / 70 g.  Chocolate ice cream / 66 g.  Pita bread, 1 large / 64 g.  Shrimp (steamed), 4 oz / 113 g.  Swiss cheese (diced), 70 g.  Vanilla ice cream, 66 g.  Vanilla pudding, 140 g. FOODS LOW IN POTASSIUM Less than 150 mg per serving. A serving size is  cup (120 mL or noted gram weight) unless otherwise stated. If you eat more than 1 serving of a food low in potassium, the food may be considered a food high in potassium. Fruits  Apple (slices), 55 g.  Apple juice.  Applesauce, 122 g.  Blackberries, 72 g.  Blueberries, 74 g.  Cranberries, 50 g.  Cranberry juice.  Fruit cocktail, 119 g.  Fruit punch.  Grapes, 46 g.  Grape juice.  Mandarin oranges (canned), 126 g.  Peach (slices), 77 g.  Pineapple (chunks), 83 g.  Raspberries, 62 g.  Red cherries (without pits), 78 g.  Strawberries (sliced), 83 g.  Watermelon (diced), 76 g. Vegetables  Alfalfa sprouts, 17 g.  Bell peppers (sliced), 46 g.  Cabbage (shredded), 35 g.  Cauliflower (boiled), 62 g.  Celery, 51 g.  Collard greens (boiled), 95 g.  Cucumber (sliced), 52 g.  Eggplant (cubed), 41 g.  Green beans (boiled), 63 g.  Lettuce (shredded), 1 c / 36 g.  Onions (sauteed), 44 g.  Radishes (sliced), 58 g.  Spaghetti squash, 51 g. Other Foods and Drinks  Angel food cake, 1 slice / 28 g.  Black tea.  Brown rice (cooked), 98 g.  Butter croissant, 1 medium / 57 g.  Carbonated soda.  Coffee.  Cheddar cheese (diced), 66 g.  Corn flake cereal (dry), 14 g.  Cottage cheese, 118 g.  Cream of rice cereal (cooked), 122 g.  Cream of wheat cereal (cooked), 126 g.  Crisped rice cereal (dry), 14 g.  Egg (boiled, fried, poached, omelet, scrambled), 1 large / 46 61 g.  English muffin, 1 muffin / 57 g.  Frozen ice pop, 1 pop / 55 g.  Graham cracker, 1 large rectangular cracker / 14 g.  Jelly beans, 112  g.  Non-dairy whipped topping.  Oatmeal, 88 g.  Orange sherbet, 74 g.  Puffed rice cereal (dry), 7 g.  Pasta (cooked), 70 g.  Rice cakes, 4 cakes / 36   g.  Sugared doughnut, 4 oz / 116 g.  White bread, 1 slice / 30 g.  White rice (cooked), 79 93 g.  Wild rice (cooked), 82 g.  Yellow cake, 1 slice / 68 g. Document Released: 05/11/2005 Document Revised: 09/13/2012 Document Reviewed: 02/11/2012 [employer] Patient Information 2014 [employer].  

## 2014-02-27 ENCOUNTER — Other Ambulatory Visit (HOSPITAL_COMMUNITY): Payer: Self-pay | Admitting: *Deleted

## 2014-02-27 NOTE — Patient Instructions (Addendum)
Itzamara Rapp-[place]  02/27/2014                           YOUR PROCEDURE IS SCHEDULED ON: 03/11/14 AT 7:30 AM               ENTER THRU [facility] MAIN HOSPITAL ENTRANCE AND                            FOLLOW  SIGNS TO SHORT STAY CENTER                 ARRIVE AT SHORT STAY AT: 5:30 AM               CALL THIS NUMBER IF ANY PROBLEMS THE DAY OF SURGERY :               [phone]                                REMEMBER:   Do not eat food or drink liquids AFTER MIDNIGHT   STOP ASPIRIN AND HERBAL MEDS 5 DAYS PREOP               Take these medicines the morning of surgery with               A SIPS OF WATER :   ZYRTEC / NEXIUM /SYNTHROID / SINGULAR / LYRICA / MAY TAKE ALBUTERO / ADVAIR IF NEEDED / NASONEX /TRAMADOL      Do not wear jewelry, make-up   Do not wear lotions, powders, or perfumes.   Do not shave legs or underarms 12 hrs. before surgery (men may shave face)  Do not bring valuables to the hospital.  Contacts, dentures or bridgework may not be worn into surgery.  Leave suitcase in the car. After surgery it may be brought to your room.  For patients admitted to the hospital more than one night, checkout time is            11:00 AM                                                       The day of discharge.   Patients discharged the day of surgery will not be allowed to drive home.            If going home same day of surgery, must have someone stay with you              FIRST 24 hrs at home and arrange for some one to drive you              home from hospital.   ________________________________________________________________________                                                                        [facility] - PREPARING FOR SURGERY  Before surgery, you can play an important role.  Because skin  is not sterile, your skin needs to be as free of germs as possible.  You can reduce the number of germs on your skin by washing with CHG (chlorahexidine gluconate)  soap before surgery.  CHG is an antiseptic cleaner which kills germs and bonds with the skin to continue killing germs even after washing. Please DO NOT use if you have an allergy to CHG or antibacterial soaps.  If your skin becomes reddened/irritated stop using the CHG and inform your nurse when you arrive at Short Stay. Do not shave (including legs and underarms) for at least 48 hours prior to the first CHG shower.  You may shave your face. Please follow these instructions carefully:   1.  Shower with CHG Soap the night before surgery and the  morning of Surgery.   2.  If you choose to wash your hair, wash your hair first as usual with your  normal  Shampoo.   3.  After you shampoo, rinse your hair and body thoroughly to remove the  shampoo.                                         4.  Use CHG as you would any other liquid soap.  You can apply chg directly  to the skin and wash . Gently wash with scrungie or clean wascloth    5.  Apply the CHG Soap to your body ONLY FROM THE NECK DOWN.   Do not use on open                           Wound or open sores. Avoid contact with eyes, ears mouth and genitals (private parts).                        Genitals (private parts) with your normal soap.              6.  Wash thoroughly, paying special attention to the area where your surgery  will be performed.   7.  Thoroughly rinse your body with warm water from the neck down.   8.  DO NOT shower/wash with your normal soap after using and rinsing off  the CHG Soap .                9.  Pat yourself dry with a clean towel.             10.  Wear clean pajamas.             11.  Place clean sheets on your bed the night of your first shower and do not  sleep with pets.  Day of Surgery : Do not apply any lotions/deodorants the morning of surgery.  Please wear clean clothes to the hospital/surgery center.  FAILURE TO FOLLOW THESE INSTRUCTIONS MAY RESULT IN THE CANCELLATION OF YOUR SURGERY    PATIENT  SIGNATURE_________________________________

## 2014-03-01 ENCOUNTER — Encounter (HOSPITAL_COMMUNITY): Payer: Self-pay | Admitting: Pharmacy Technician

## 2014-03-01 ENCOUNTER — Encounter (HOSPITAL_COMMUNITY)
Admission: RE | Admit: 2014-03-01 | Discharge: 2014-03-01 | Disposition: A | Discharge status: Home / Self Care | Payer: 59 | Source: Ambulatory Visit | Attending: Surgery | Admitting: Surgery

## 2014-03-01 ENCOUNTER — Encounter (HOSPITAL_COMMUNITY): Payer: Self-pay

## 2014-03-01 ENCOUNTER — Telehealth: Payer: Self-pay

## 2014-03-01 DIAGNOSIS — G8929 Other chronic pain: Secondary | ICD-10-CM

## 2014-03-01 DIAGNOSIS — Z01810 Encounter for preprocedural cardiovascular examination: Secondary | ICD-10-CM | POA: Insufficient documentation

## 2014-03-01 DIAGNOSIS — Z01812 Encounter for preprocedural laboratory examination: Secondary | ICD-10-CM | POA: Insufficient documentation

## 2014-03-01 DIAGNOSIS — M797 Fibromyalgia: Secondary | ICD-10-CM

## 2014-03-01 HISTORY — DX: Effusion, left ankle: M25.471

## 2014-03-01 HISTORY — DX: Paresthesia of skin: R20.2

## 2014-03-01 HISTORY — DX: Sleep disorder, unspecified: G47.9

## 2014-03-01 HISTORY — DX: Effusion, left ankle: M25.472

## 2014-03-01 HISTORY — DX: Major depressive disorder, single episode, unspecified: F32.9

## 2014-03-01 HISTORY — DX: Depression, unspecified: F32.A

## 2014-03-01 HISTORY — DX: Paresthesia of skin: R20.0

## 2014-03-01 HISTORY — DX: Irritable bowel syndrome, unspecified: K58.9

## 2014-03-01 LAB — BASIC METABOLIC PANEL
BUN: 13 mg/dL (ref 6–23)
CHLORIDE: 96 meq/L (ref 96–112)
CO2: 28 mEq/L (ref 19–32)
CREATININE: 0.65 mg/dL (ref 0.50–1.10)
Calcium: 10 mg/dL (ref 8.4–10.5)
GFR calc non Af Amer: >90 mL/min (ref >90)
Glucose, Bld: 94 mg/dL (ref 70–99)
Potassium: 3.6 mEq/L — ABNORMAL LOW (ref 3.7–5.3)
SODIUM: 137 meq/L (ref 137–147)

## 2014-03-01 LAB — CBC
HEMATOCRIT: 35.4 % — AB (ref 36.0–46.0)
Hemoglobin: 11.5 g/dL — ABNORMAL LOW (ref 12.0–15.0)
MCH: 27.4 pg (ref 26.0–34.0)
MCHC: 32.5 g/dL (ref 30.0–36.0)
MCV: 84.3 fL (ref 78.0–100.0)
PLATELETS: 283 10*3/uL (ref 150–400)
RBC: 4.2 MIL/uL (ref 3.87–5.11)
RDW: 13.4 % (ref 11.5–15.5)
WBC: 12.2 10*3/uL — AB (ref 4.0–10.5)

## 2014-03-01 MED ORDER — CARISOPRODOL 350 MG PO TABS: 350.0000 mg | ORAL_TABLET | Freq: Four times a day (QID) | ORAL | Status: DC | PRN

## 2014-03-01 NOTE — Telephone Encounter (Signed)
Pharm faxed req for Soma RF. It was Rxd at 02/15/14 OV but was set on "phone in". Verified w/pharm they do not have a record of Rx and verified w/Dr Clelia Croft who stated she didn't realize it did not print and auth me to phone in now to [facility].

## 2014-03-01 NOTE — Progress Notes (Signed)
03/01/14 1354  OBSTRUCTIVE SLEEP APNEA  Have you ever been diagnosed with sleep apnea through a sleep study? No  Do you snore loudly (loud enough to be heard through closed doors)?  1  Do you often feel tired, fatigued, or sleepy during the daytime? 1  Has anyone observed you stop breathing during your sleep? 0  Do you have, or are you being treated for high blood pressure? 1  BMI more than 35 kg/m2? 1  Age over 50 years old? 1  Neck circumference greater than 40 cm/16 inches? 0  Gender: 0  Obstructive Sleep Apnea Score 5

## 2014-03-05 ENCOUNTER — Ambulatory Visit (INDEPENDENT_AMBULATORY_CARE_PROVIDER_SITE_OTHER): Payer: 59 | Admitting: Emergency Medicine

## 2014-03-05 VITALS — BP 136/90 | HR 101 | Temp 98.7°F | Resp 18 | Ht 64.0 in | Wt 297.0 lb

## 2014-03-05 DIAGNOSIS — J40 Bronchitis, not specified as acute or chronic: Secondary | ICD-10-CM

## 2014-03-05 NOTE — Progress Notes (Addendum)
   Subjective:    Patient ID: Gloria Carter, female    DOB: [birth year], 48 y.o.   MRN: [id number]  HPI 48 yo female with cough for 1.5 weeks that is productive of sputum.  States it is slightly better, however as husband was being seen she decided to get checked out.  Using inhaler and mucinex.  No fever or chills.  No chest pain.  No weakness.  PPMH:  RAD, fibromyalgia.  SH:  Nonsmoker, no alcohol.   Review of Systems  Constitutional: Negative for fever and chills.  HENT: Negative for postnasal drip, rhinorrhea, sinus pressure and sore throat.   Respiratory: Positive for cough, shortness of breath and wheezing. Negative for chest tightness.   Cardiovascular: Negative for chest pain, palpitations and leg swelling.       Objective:   Physical Exam Blood pressure 136/90, pulse 101, temperature 98.7 F (37.1 C), temperature source Oral, resp. rate 18, height 5\' 4"  (1.626 m), weight 297 lb (134.718 kg), SpO2 96.00%. Body mass index is 50.95 kg/(m^2). Well-developed, well nourished female who is awake, alert and oriented, in NAD. HEENT: [place]/AT, PERRL, EOMI.  Sclera and conjunctiva are clear.  OP is clear. Neck: supple, non-tender, no lymphadenopathy, thyromegaly. Heart: RRR, no murmur Lungs: normal effort, faint wheeze. Extremities: no cyanosis, clubbing or edema. Skin: warm and dry without rash. Psychologic: good mood and appropriate affect, normal speech and behavior.       Assessment & Plan:  Bronchitis - improving, likely viral as symtoms are improving.  Recommend to continue current regimen and return if worsen.  I have reviewed and agree with documentation. Robert P. Merla Riches, M.D.

## 2014-03-05 NOTE — Progress Notes (Signed)
Quick Note:  These results are acceptable for scheduled surgery.  Tyresha Fede M. Keyonta Madrid, MD, FACS [facility] Office: [phone]   ______ 

## 2014-03-05 NOTE — Progress Notes (Signed)
Quick Note:  EKG is acceptable for scheduled surgery.  Gloria Carter M. Issaiah Seabrooks, MD, FACS [facility] Office: [phone]   ______ 

## 2014-03-07 ENCOUNTER — Ambulatory Visit (INDEPENDENT_AMBULATORY_CARE_PROVIDER_SITE_OTHER): Payer: 59 | Admitting: Emergency Medicine

## 2014-03-07 VITALS — BP 128/82 | HR 97 | Temp 98.1°F | Resp 18 | Ht 64.0 in | Wt 297.0 lb

## 2014-03-07 DIAGNOSIS — J209 Acute bronchitis, unspecified: Secondary | ICD-10-CM

## 2014-03-07 DIAGNOSIS — J018 Other acute sinusitis: Secondary | ICD-10-CM

## 2014-03-07 MED ORDER — PSEUDOEPHEDRINE-GUAIFENESIN ER 60-600 MG PO TB12: 1.0000 | ORAL_TABLET | Freq: Two times a day (BID) | ORAL | Status: DC

## 2014-03-07 MED ORDER — HYDROCOD POLST-CHLORPHEN POLST 10-8 MG/5ML PO LQCR: 5.0000 mL | Freq: Two times a day (BID) | ORAL | Status: DC | PRN

## 2014-03-07 MED ORDER — AMOXICILLIN-POT CLAVULANATE 875-125 MG PO TABS: 1.0000 | ORAL_TABLET | Freq: Two times a day (BID) | ORAL | Status: DC

## 2014-03-07 NOTE — Progress Notes (Signed)
[facility] [address], [place] [id number] [phone]  Date:  03/07/2014   Name:  Gloria Carter   DOB:  02/11/1966   MRN:  [id number]  PCP:  Norberto SorensonSHAW,EVA, MD    Chief Complaint: Cough   History of Present Illness:  Gloria Carter is a 48 y.o. very pleasant female patient who presents with the following:  Patient has a three week history of nasal congestion and purulent nasal drainage, sore throat and a cough.  The cough is largely non productive and often productive of green sputum.  Says has shortness of breath with exertion and wheezes at times.  No fever or chills.  Fatigue.  Seen Tuesday and felt to be viral. No improvement with over the counter medications or other home remedies. Denies other complaint or health concern today.  Patient Active Problem List   Diagnosis Date Noted  . Obesity, morbid, BMI 50 or higher 02/15/2014  . Prediabetes 09/08/2013  . Unspecified hypothyroidism 08/31/2013  . Asthma, chronic 08/31/2013  . Upper airway cough syndrome 08/31/2013  . Carpal tunnel syndrome 06/01/2013  . Wrist tendonitis 06/01/2013  . Encounter for long-term (current) use of other medications 12/01/2012  . HTN (hypertension) 08/13/2012  . BMI 45.0-49.9, adult 08/13/2012  . Hyperlipidemia 08/13/2012  . IBS (irritable bowel syndrome) 08/13/2012  . ADD (attention deficit disorder) 02/07/2012  . GERD (gastroesophageal reflux disease) 02/07/2012  . Migraine 02/07/2012  . Dysphagia 02/07/2012  . Hernia of abdominal wall 02/07/2012  . RAD (reactive airway disease) 12/20/2011  . Fibromyalgia 12/20/2011  . Chronic pain 12/20/2011    Past Medical History  Diagnosis Date  . Asthma   . Dysphagia   . Fibromyalgia   . Migraine   . GERD (gastroesophageal reflux disease)   . ADD (attention deficit disorder)   . Hernia of abdominal wall   . Allergy     ENVIRONMENTAL  . Hypertension   . Thyroid disease   . Fibromyalgia   . Numbness and  tingling in hands   . Arthritis     knees  . IBS (irritable bowel syndrome)   . Difficulty sleeping   . Depression   . Swelling of both ankles     Past Surgical History  Procedure Laterality Date  . Tonsilectomy/adenoidectomy with myringotomy    . Wisdom tooth extraction      History  Substance Use Topics  . Smoking status: Never Smoker   . Smokeless tobacco: Not on file  . Alcohol Use: No    Family History  Problem Relation Age of Onset  . Colon cancer    . COPD Mother     Allergies  Allergen Reactions  . Codeine Nausea Only    Can tolerate hydromet and tussionex  . Sulfa Antibiotics Nausea And Vomiting    Medication list has been reviewed and updated.  Current Outpatient Prescriptions on File Prior to Visit  Medication Sig Dispense Refill  . albuterol (PROVENTIL HFA;VENTOLIN HFA) 108 (90 BASE) MCG/ACT inhaler Inhale 2 puffs into the lungs every 6 (six) hours as needed for wheezing or shortness of breath.      . amphetamine-dextroamphetamine (ADDERALL) 7.5 MG tablet Take 1 tablet (7.5 mg total) by mouth daily. May fill on or after 02/15/14.  30 tablet  0  . amphetamine-dextroamphetamine (ADDERALL) 7.5 MG tablet Take 1 tablet (7.5 mg total) by mouth daily. May fill on or after 04/17/14  30 tablet  0  . B Complex-C-Folic Acid (B-COMPLEX BALANCED PO) Take  1 tablet by mouth daily.       . calcium carbonate (OS-CAL) 600 MG TABS Take 600 mg by mouth daily.      . carisoprodol (SOMA) 350 MG tablet Take 1 tablet (350 mg total) by mouth 4 (four) times daily as needed for muscle spasms.  120 tablet  2  . cetirizine (ZYRTEC) 10 MG tablet Take 10 mg by mouth daily as needed for allergies.       . cholecalciferol (VITAMIN D) 400 UNITS TABS Take 400 Units by mouth daily.      . citalopram (CELEXA) 10 MG tablet Take 10 mg by mouth every evening.      Marland Kitchen esomeprazole (NEXIUM) 40 MG capsule Take 1 capsule (40 mg total) by mouth daily.  90 capsule  3  . Fluticasone-Salmeterol (ADVAIR)  100-50 MCG/DOSE AEPB Inhale 1 puff into the lungs 2 (two) times daily.  60 each  11  . hyoscyamine (LEVBID) 0.375 MG 12 hr tablet Take 1 tablet (0.375 mg total) by mouth every 12 (twelve) hours as needed for cramping.  60 tablet  5  . ibuprofen (ADVIL,MOTRIN) 200 MG tablet Take 400-600 mg by mouth every 8 (eight) hours as needed for moderate pain.       . mometasone (NASONEX) 50 MCG/ACT nasal spray Place 2 sprays into the nose daily.  17 g  11  . montelukast (SINGULAIR) 10 MG tablet Take 10 mg by mouth every morning.      . Multiple Vitamin (MULTIVITAMIN) tablet Take 1 tablet by mouth daily.      Bertram Gala Glycol-Propyl Glycol (SYSTANE) 0.4-0.3 % SOLN Apply 1 drop to eye 3 (three) times daily as needed (dry eyes).      . pregabalin (LYRICA) 150 MG capsule Take 1 capsule (150 mg total) by mouth 3 (three) times daily.  270 capsule  1  . ranitidine (ZANTAC) 150 MG tablet Take 150 mg by mouth 2 (two) times daily as needed for heartburn.      . Red Yeast Rice 600 MG CAPS Take 600 mg by mouth daily.      . SUMAtriptan (IMITREX) 100 MG tablet Take 1 tablet (100 mg total) by mouth every 2 (two) hours as needed for migraine.  4 tablet  11  . SYNTHROID 50 MCG tablet Take 1 tablet (50 mcg total) by mouth daily before breakfast.  90 tablet  1  . telmisartan-hydrochlorothiazide (MICARDIS HCT) 80-12.5 MG per tablet Take 1 tablet by mouth every evening.       . traMADol (ULTRAM) 50 MG tablet Take 1 tablet (50 mg total) by mouth 4 (four) times daily.  120 tablet  2  . vitamin C (ASCORBIC ACID) 500 MG tablet Take 1,000 mg by mouth daily.      Marland Kitchen guaiFENesin (MUCINEX) 600 MG 12 hr tablet Take 1,200 mg by mouth daily as needed for cough.       . [DISCONTINUED] hyoscyamine (CYSTOSPAZ) 0.15 MG tablet Take 0.15 mg by mouth every 4 (four) hours as needed.       No current facility-administered medications on file prior to visit.    Review of Systems:  As per HPI, otherwise negative.    Physical  Examination: Filed Vitals:   03/07/14 1721  BP: 128/82  Pulse: 97  Temp: 98.1 F (36.7 C)  Resp: 18   Filed Vitals:   03/07/14 1721  Height: 5\' 4"  (1.626 m)  Weight: 297 lb (134.718 kg)   Body mass index is 50.95 kg/(m^2).  Ideal Body Weight: Weight in (lb) to have BMI = 25: 145.3  GEN: obese, NAD, Non-toxic, A & O x 3  Frequent cough.   HEENT: Atraumatic, Normocephalic. Neck supple. No masses, No LAD. Ears and Nose: No external deformity.  Septal perforation.  Diffuse swelling and evidence prior bleed CV: RRR, No M/G/R. No JVD. No thrill. No extra heart sounds. PULM: CTA B, no wheezes, crackles, rhonchi. No retractions. No resp. distress. No accessory muscle use. ABD: S, NT, ND, +BS. No rebound. No HSM. EXTR: No c/c/e NEURO Normal gait.  PSYCH: Normally interactive. Conversant. Not depressed or anxious appearing.  Calm demeanor.    Assessment and Plan: Morbid obesity Sinusitis Bronchitis augmentin mucinex d Phen c cod  Signed,  Phillips Odor, MD

## 2014-03-07 NOTE — Patient Instructions (Signed)

## 2014-03-09 ENCOUNTER — Other Ambulatory Visit: Payer: Self-pay | Admitting: Family Medicine

## 2014-03-14 NOTE — Progress Notes (Signed)
Patient notified of surgery date change.  Is to be in Short Stay ay 10:00 03/28/14.  Instructed NPO after midnight unless she was instructed in any medication that morning with sip water

## 2014-03-27 ENCOUNTER — Encounter (INDEPENDENT_AMBULATORY_CARE_PROVIDER_SITE_OTHER): Payer: 59 | Admitting: Surgery

## 2014-03-27 MED ORDER — DEXTROSE 5 % IV SOLN
3.0000 g | INTRAVENOUS | Status: AC
  Administered 2014-03-28: 3 g via INTRAVENOUS
  Filled 2014-03-27: qty 3000

## 2014-03-28 ENCOUNTER — Observation Stay (HOSPITAL_COMMUNITY)
Admission: RE | Admit: 2014-03-28 | Discharge: 2014-03-31 | Disposition: A | Discharge status: Home / Self Care | Payer: 59 | Source: Ambulatory Visit | Attending: Surgery | Admitting: Surgery

## 2014-03-28 ENCOUNTER — Encounter (HOSPITAL_COMMUNITY): Payer: Self-pay | Admitting: *Deleted

## 2014-03-28 ENCOUNTER — Encounter (HOSPITAL_COMMUNITY): Payer: 59 | Admitting: Anesthesiology

## 2014-03-28 ENCOUNTER — Ambulatory Visit (HOSPITAL_COMMUNITY): Payer: 59 | Admitting: Anesthesiology

## 2014-03-28 ENCOUNTER — Encounter (HOSPITAL_COMMUNITY)
Admission: RE | Disposition: A | Discharge status: Home / Self Care | Payer: Self-pay | Source: Ambulatory Visit | Attending: Surgery

## 2014-03-28 DIAGNOSIS — E039 Hypothyroidism, unspecified: Secondary | ICD-10-CM | POA: Insufficient documentation

## 2014-03-28 DIAGNOSIS — IMO0001 Reserved for inherently not codable concepts without codable children: Secondary | ICD-10-CM | POA: Insufficient documentation

## 2014-03-28 DIAGNOSIS — F988 Other specified behavioral and emotional disorders with onset usually occurring in childhood and adolescence: Secondary | ICD-10-CM | POA: Insufficient documentation

## 2014-03-28 DIAGNOSIS — J45909 Unspecified asthma, uncomplicated: Secondary | ICD-10-CM | POA: Insufficient documentation

## 2014-03-28 DIAGNOSIS — K219 Gastro-esophageal reflux disease without esophagitis: Secondary | ICD-10-CM | POA: Insufficient documentation

## 2014-03-28 DIAGNOSIS — I10 Essential (primary) hypertension: Secondary | ICD-10-CM | POA: Insufficient documentation

## 2014-03-28 DIAGNOSIS — Z79899 Other long term (current) drug therapy: Secondary | ICD-10-CM | POA: Insufficient documentation

## 2014-03-28 DIAGNOSIS — K439 Ventral hernia without obstruction or gangrene: Secondary | ICD-10-CM | POA: Diagnosis present

## 2014-03-28 DIAGNOSIS — K436 Other and unspecified ventral hernia with obstruction, without gangrene: Secondary | ICD-10-CM

## 2014-03-28 HISTORY — PX: INSERTION OF MESH: SHX5868

## 2014-03-28 HISTORY — PX: VENTRAL HERNIA REPAIR: SHX424

## 2014-03-28 SURGERY — REPAIR, HERNIA, VENTRAL, LAPAROSCOPIC
Anesthesia: General | Site: Abdomen

## 2014-03-28 MED ORDER — ALBUTEROL SULFATE (2.5 MG/3ML) 0.083% IN NEBU: 2.5000 mg | INHALATION_SOLUTION | Freq: Four times a day (QID) | RESPIRATORY_TRACT | Status: DC | PRN

## 2014-03-28 MED ORDER — FENTANYL CITRATE 0.05 MG/ML IJ SOLN
INTRAMUSCULAR | Status: DC | PRN
  Administered 2014-03-28 (×2): 25 ug via INTRAVENOUS
  Administered 2014-03-28: 100 ug via INTRAVENOUS
  Administered 2014-03-28: 50 ug via INTRAVENOUS
  Administered 2014-03-28 (×4): 25 ug via INTRAVENOUS
  Administered 2014-03-28: 50 ug via INTRAVENOUS

## 2014-03-28 MED ORDER — HYDROCHLOROTHIAZIDE 12.5 MG PO CAPS
12.5000 mg | ORAL_CAPSULE | Freq: Every evening | ORAL | Status: DC
  Administered 2014-03-28 – 2014-03-30 (×3): 12.5 mg via ORAL
  Filled 2014-03-28 (×4): qty 1

## 2014-03-28 MED ORDER — DEXAMETHASONE SODIUM PHOSPHATE 4 MG/ML IJ SOLN
INTRAMUSCULAR | Status: DC | PRN
  Administered 2014-03-28: 10 mg via INTRAVENOUS

## 2014-03-28 MED ORDER — ONDANSETRON HCL 4 MG/2ML IJ SOLN
4.0000 mg | Freq: Four times a day (QID) | INTRAMUSCULAR | Status: DC | PRN
  Administered 2014-03-29 – 2014-03-30 (×2): 4 mg via INTRAVENOUS
  Filled 2014-03-28 (×2): qty 2

## 2014-03-28 MED ORDER — GLYCOPYRROLATE 0.2 MG/ML IJ SOLN
INTRAMUSCULAR | Status: DC | PRN
  Administered 2014-03-28: 0.4 mg via INTRAVENOUS
  Administered 2014-03-28: 0.2 mg via INTRAVENOUS

## 2014-03-28 MED ORDER — AMPHETAMINE-DEXTROAMPHETAMINE 7.5 MG PO TABS: 7.5000 mg | ORAL_TABLET | Freq: Every day | ORAL | Status: DC

## 2014-03-28 MED ORDER — ACETAMINOPHEN 10 MG/ML IV SOLN
INTRAVENOUS | Status: DC | PRN
  Administered 2014-03-28: 1000 mg via INTRAVENOUS

## 2014-03-28 MED ORDER — KCL IN DEXTROSE-NACL 20-5-0.45 MEQ/L-%-% IV SOLN
INTRAVENOUS | Status: DC
  Administered 2014-03-28 – 2014-03-31 (×4): via INTRAVENOUS
  Filled 2014-03-28 (×4): qty 1000

## 2014-03-28 MED ORDER — LACTATED RINGERS IV SOLN
INTRAVENOUS | Status: DC | PRN
  Administered 2014-03-28 (×2): via INTRAVENOUS

## 2014-03-28 MED ORDER — ACETAMINOPHEN 325 MG PO TABS: 650.0000 mg | ORAL_TABLET | ORAL | Status: DC | PRN

## 2014-03-28 MED ORDER — LACTATED RINGERS IV SOLN
INTRAVENOUS | Status: DC
  Administered 2014-03-28: 1000 mL via INTRAVENOUS

## 2014-03-28 MED ORDER — LACTATED RINGERS IV SOLN: INTRAVENOUS | Status: DC

## 2014-03-28 MED ORDER — BUPIVACAINE-EPINEPHRINE 0.25% -1:200000 IJ SOLN
INTRAMUSCULAR | Status: AC
  Filled 2014-03-28: qty 1

## 2014-03-28 MED ORDER — CISATRACURIUM BESYLATE (PF) 10 MG/5ML IV SOLN
INTRAVENOUS | Status: DC | PRN
  Administered 2014-03-28 (×2): 1 mg via INTRAVENOUS
  Administered 2014-03-28: 10 mg via INTRAVENOUS
  Administered 2014-03-28: 1 mg via INTRAVENOUS
  Administered 2014-03-28 (×2): .5 mg via INTRAVENOUS

## 2014-03-28 MED ORDER — IRBESARTAN 300 MG PO TABS
300.0000 mg | ORAL_TABLET | Freq: Every evening | ORAL | Status: DC
  Administered 2014-03-28 – 2014-03-30 (×3): 300 mg via ORAL
  Filled 2014-03-28 (×4): qty 1

## 2014-03-28 MED ORDER — NEOSTIGMINE METHYLSULFATE 10 MG/10ML IV SOLN
INTRAVENOUS | Status: DC | PRN
  Administered 2014-03-28: 3 mg via INTRAVENOUS

## 2014-03-28 MED ORDER — PREGABALIN 75 MG PO CAPS
150.0000 mg | ORAL_CAPSULE | Freq: Three times a day (TID) | ORAL | Status: DC
Start: 2014-03-28 — End: 2014-03-31
  Administered 2014-03-28 – 2014-03-31 (×9): 150 mg via ORAL
  Filled 2014-03-28 (×9): qty 2

## 2014-03-28 MED ORDER — SUCCINYLCHOLINE CHLORIDE 20 MG/ML IJ SOLN
INTRAMUSCULAR | Status: DC | PRN
  Administered 2014-03-28: 160 mg via INTRAVENOUS

## 2014-03-28 MED ORDER — PROPOFOL 10 MG/ML IV BOLUS
INTRAVENOUS | Status: DC | PRN
  Administered 2014-03-28: 200 mg via INTRAVENOUS

## 2014-03-28 MED ORDER — ONDANSETRON HCL 4 MG/2ML IJ SOLN
INTRAMUSCULAR | Status: DC | PRN
  Administered 2014-03-28 (×2): 2 mg via INTRAVENOUS

## 2014-03-28 MED ORDER — HYDROMORPHONE HCL PF 1 MG/ML IJ SOLN
INTRAMUSCULAR | Status: AC
  Filled 2014-03-28: qty 1

## 2014-03-28 MED ORDER — BIOTENE DRY MOUTH MT LIQD
15.0000 mL | Freq: Two times a day (BID) | OROMUCOSAL | Status: DC
  Administered 2014-03-28 – 2014-03-29 (×3): 15 mL via OROMUCOSAL

## 2014-03-28 MED ORDER — MONTELUKAST SODIUM 10 MG PO TABS
10.0000 mg | ORAL_TABLET | Freq: Every day | ORAL | Status: DC
  Administered 2014-03-29 – 2014-03-30 (×2): 10 mg via ORAL
  Filled 2014-03-28 (×3): qty 1

## 2014-03-28 MED ORDER — HYDROMORPHONE HCL PF 1 MG/ML IJ SOLN
1.0000 mg | INTRAMUSCULAR | Status: DC | PRN
  Administered 2014-03-28 – 2014-03-30 (×11): 1 mg via INTRAVENOUS
  Filled 2014-03-28 (×11): qty 1

## 2014-03-28 MED ORDER — ALBUTEROL SULFATE HFA 108 (90 BASE) MCG/ACT IN AERS: 2.0000 | INHALATION_SPRAY | Freq: Four times a day (QID) | RESPIRATORY_TRACT | Status: DC | PRN

## 2014-03-28 MED ORDER — MOMETASONE FURO-FORMOTEROL FUM 100-5 MCG/ACT IN AERO
2.0000 | INHALATION_SPRAY | Freq: Two times a day (BID) | RESPIRATORY_TRACT | Status: DC
  Administered 2014-03-28 – 2014-03-30 (×5): 2 via RESPIRATORY_TRACT
  Filled 2014-03-28: qty 8.8

## 2014-03-28 MED ORDER — PROMETHAZINE HCL 25 MG/ML IJ SOLN: 6.2500 mg | INTRAMUSCULAR | Status: DC | PRN

## 2014-03-28 MED ORDER — ACETAMINOPHEN 10 MG/ML IV SOLN
1000.0000 mg | Freq: Once | INTRAVENOUS | Status: DC
  Filled 2014-03-28: qty 100

## 2014-03-28 MED ORDER — CITALOPRAM HYDROBROMIDE 10 MG PO TABS
10.0000 mg | ORAL_TABLET | Freq: Every evening | ORAL | Status: DC
  Administered 2014-03-29 – 2014-03-30 (×2): 10 mg via ORAL
  Filled 2014-03-28 (×4): qty 1

## 2014-03-28 MED ORDER — HYDROMORPHONE HCL PF 1 MG/ML IJ SOLN
0.2500 mg | INTRAMUSCULAR | Status: DC | PRN
  Administered 2014-03-28 (×2): 0.25 mg via INTRAVENOUS
  Administered 2014-03-28: 0.5 mg via INTRAVENOUS
  Administered 2014-03-28 (×2): 0.25 mg via INTRAVENOUS

## 2014-03-28 MED ORDER — TELMISARTAN-HCTZ 80-12.5 MG PO TABS: 1.0000 | ORAL_TABLET | Freq: Every evening | ORAL | Status: DC

## 2014-03-28 MED ORDER — LIDOCAINE HCL (CARDIAC) 20 MG/ML IV SOLN
INTRAVENOUS | Status: DC | PRN
  Administered 2014-03-28: 30 mg via INTRAVENOUS

## 2014-03-28 MED ORDER — HYDROCODONE-ACETAMINOPHEN 5-325 MG PO TABS
1.0000 | ORAL_TABLET | ORAL | Status: DC | PRN
  Administered 2014-03-29: 1 via ORAL
  Administered 2014-03-30 – 2014-03-31 (×6): 2 via ORAL
  Filled 2014-03-28: qty 2
  Filled 2014-03-28: qty 1
  Filled 2014-03-28 (×5): qty 2

## 2014-03-28 MED ORDER — LEVOTHYROXINE SODIUM 50 MCG PO TABS
50.0000 ug | ORAL_TABLET | Freq: Every day | ORAL | Status: DC
  Administered 2014-03-29 – 2014-03-31 (×3): 50 ug via ORAL
  Filled 2014-03-28 (×4): qty 1

## 2014-03-28 MED ORDER — EPHEDRINE SULFATE 50 MG/ML IJ SOLN
INTRAMUSCULAR | Status: DC | PRN
  Administered 2014-03-28: 15 mg via INTRAVENOUS
  Administered 2014-03-28: 10 mg via INTRAVENOUS

## 2014-03-28 MED ORDER — ONDANSETRON HCL 4 MG PO TABS
4.0000 mg | ORAL_TABLET | Freq: Four times a day (QID) | ORAL | Status: DC | PRN
Start: 2014-03-28 — End: 2014-03-31
  Administered 2014-03-30: 4 mg via ORAL
  Filled 2014-03-28: qty 1

## 2014-03-28 MED ORDER — BUPIVACAINE-EPINEPHRINE 0.25% -1:200000 IJ SOLN
INTRAMUSCULAR | Status: DC | PRN
  Administered 2014-03-28 (×2): 10 mL

## 2014-03-28 SURGICAL SUPPLY — 34 items
APL SKNCLS STERI-STRIP NONHPOA (GAUZE/BANDAGES/DRESSINGS) ×2
BENZOIN TINCTURE PRP APPL 2/3 (GAUZE/BANDAGES/DRESSINGS) ×3 IMPLANT
BINDER ABDOMINAL 12 ML 46-62 (SOFTGOODS) ×3 IMPLANT
CANISTER SUCTION 2500CC (MISCELLANEOUS) ×3 IMPLANT
CHLORAPREP W/TINT 26ML (MISCELLANEOUS) ×3 IMPLANT
DECANTER SPIKE VIAL GLASS SM (MISCELLANEOUS) ×3 IMPLANT
DEVICE SECURE STRAP 25 ABSORB (INSTRUMENTS) ×3 IMPLANT
DEVICE TROCAR PUNCTURE CLOSURE (ENDOMECHANICALS) ×3 IMPLANT
DRAPE INCISE IOBAN 66X45 STRL (DRAPES) IMPLANT
DRAPE LAPAROSCOPIC ABDOMINAL (DRAPES) ×3 IMPLANT
DRAPE UTILITY XL STRL (DRAPES) ×3 IMPLANT
ELECT REM PT RETURN 9FT ADLT (ELECTROSURGICAL) ×3
ELECTRODE REM PT RTRN 9FT ADLT (ELECTROSURGICAL) ×2 IMPLANT
GLOVE SURG ORTHO 8.0 STRL STRW (GLOVE) ×4 IMPLANT
GLOVE SURG SS PI 6.5 STRL IVOR (GLOVE) ×1 IMPLANT
GOWN STRL REUS W/TWL XL LVL3 (GOWN DISPOSABLE) ×8 IMPLANT
HOVERMATT SINGLE USE (MISCELLANEOUS) ×1 IMPLANT
KIT BASIN OR (CUSTOM PROCEDURE TRAY) ×3 IMPLANT
MARKER SKIN DUAL TIP RULER LAB (MISCELLANEOUS) ×3 IMPLANT
MESH VENTRALIGHT ST 7X9N (Mesh General) ×1 IMPLANT
NDL SPNL 22GX3.5 QUINCKE BK (NEEDLE) ×2 IMPLANT
NEEDLE SPNL 22GX3.5 QUINCKE BK (NEEDLE) ×3 IMPLANT
SCISSORS LAP 5X35 DISP (ENDOMECHANICALS) ×2 IMPLANT
SET IRRIG TUBING LAPAROSCOPIC (IRRIGATION / IRRIGATOR) ×1 IMPLANT
SHEARS HARMONIC ACE PLUS 36CM (ENDOMECHANICALS) IMPLANT
SOLUTION ANTI FOG 6CC (MISCELLANEOUS) ×3 IMPLANT
STRIP CLOSURE SKIN 1/2X4 (GAUZE/BANDAGES/DRESSINGS) ×6 IMPLANT
SUT NOVA NAB DX-16 0-1 5-0 T12 (SUTURE) ×2 IMPLANT
TACKER 5MM HERNIA 3.5CML NAB (ENDOMECHANICALS) IMPLANT
TOWEL OR 17X26 10 PK STRL BLUE (TOWEL DISPOSABLE) ×3 IMPLANT
TOWEL OR NON WOVEN STRL DISP B (DISPOSABLE) ×3 IMPLANT
TRAY FOLEY CATH 14FRSI W/METER (CATHETERS) ×4 IMPLANT
TRAY LAP CHOLE (CUSTOM PROCEDURE TRAY) ×3 IMPLANT
TUBING INSUFFLATION 10FT LAP (TUBING) ×3 IMPLANT

## 2014-03-28 NOTE — Anesthesia Postprocedure Evaluation (Signed)
Anesthesia Post Note  Patient: Gloria Carter  Procedure(s) Performed: Procedure(s) (LRB): LAPAROSCOPIC VENTRAL HERNIA REPAIR WITH MESH (N/A) INSERTION OF MESH (N/A)  Anesthesia type: General  Patient location: PACU  Post pain: Pain level controlled  Post assessment: Post-op Vital signs reviewed  Last Vitals:  Filed Vitals:   03/28/14 1553  BP: 164/93  Pulse: 85  Temp: 36.7 C  Resp: 16    Post vital signs: Reviewed  Level of consciousness: sedated  Complications: No apparent anesthesia complications

## 2014-03-28 NOTE — Progress Notes (Signed)
Patient finished taking antibiotics for upper respiratory infection. States she feels much better

## 2014-03-28 NOTE — H&P (Signed)
Gloria Carter is an 48 y.o. female.    General Surgery [facility]  Chief Complaint:  Ventral hernia, incarcerated  HPI:  abdominal wall hernia - referral from Dr. Norberto SorensonEva Shaw and Benny LennertSarah Weber, PA-C    Patient is a 48 year old female who presents on referral from her primary care physician for evaluation of an incarcerated abdominal wall hernia. Patient first noted her hernia approximately 2 years ago. She suffers from a chronic cough. She developed a bulge which has gradually enlarged. Initially it was reducible but has not been reducible in several months. She denies any signs or symptoms of obstruction. She denies any pain. She has had no prior abdominal surgery.  Ultrasound examination was performed on 01/10/2014. This shows approximately a 16 cm hernia sac containing adipose tissue and possibly a single loop of small intestine.    Past Medical History  Diagnosis Date  . Asthma   . Dysphagia   . Fibromyalgia   . Migraine   . GERD (gastroesophageal reflux disease)   . ADD (attention deficit disorder)   . Hernia of abdominal wall   . Allergy     ENVIRONMENTAL  . Hypertension   . Thyroid disease   . Fibromyalgia   . Numbness and tingling in hands   . Arthritis     knees  . IBS (irritable bowel syndrome)   . Difficulty sleeping   . Depression   . Swelling of both ankles     Past Surgical History  Procedure Laterality Date  . Tonsilectomy/adenoidectomy with myringotomy    . Wisdom tooth extraction      Family History  Problem Relation Age of Onset  . Colon cancer    . COPD Mother    Social History:  reports that she has never smoked. She does not have any smokeless tobacco history on file. She reports that she does not drink alcohol or use illicit drugs.  Allergies:  Allergies  Allergen Reactions  . Codeine Nausea Only    Can tolerate hydromet and tussionex  . Soap     Any soaps with fragrance cause migraines  . Sulfa Antibiotics Nausea  And Vomiting    Medications Prior to Admission  Medication Sig Dispense Refill  . albuterol (PROVENTIL HFA;VENTOLIN HFA) 108 (90 BASE) MCG/ACT inhaler Inhale 2 puffs into the lungs every 6 (six) hours as needed for wheezing or shortness of breath.      Marland Kitchen. amoxicillin-clavulanate (AUGMENTIN) 875-125 MG per tablet Take 1 tablet by mouth 2 (two) times daily.  20 tablet  0  . amphetamine-dextroamphetamine (ADDERALL) 7.5 MG tablet Take 1 tablet (7.5 mg total) by mouth daily. May fill on or after 02/15/14.  30 tablet  0  . B Complex-C-Folic Acid (B-COMPLEX BALANCED PO) Take 1 tablet by mouth daily.       . calcium carbonate (OS-CAL) 600 MG TABS Take 600 mg by mouth daily.      . carisoprodol (SOMA) 350 MG tablet Take 1 tablet (350 mg total) by mouth 4 (four) times daily as needed for muscle spasms.  120 tablet  2  . cetirizine (ZYRTEC) 10 MG tablet Take 10 mg by mouth daily as needed for allergies.       . cholecalciferol (VITAMIN D) 400 UNITS TABS Take 400 Units by mouth daily.      . citalopram (CELEXA) 10 MG tablet Take 10 mg by mouth every evening.      Marland Kitchen. esomeprazole (NEXIUM) 40 MG capsule Take 1 capsule (40  mg total) by mouth daily.  90 capsule  3  . Fluticasone-Salmeterol (ADVAIR) 100-50 MCG/DOSE AEPB Inhale 1 puff into the lungs 2 (two) times daily.  60 each  11  . guaiFENesin (MUCINEX) 600 MG 12 hr tablet Take 1,200 mg by mouth daily as needed for cough.       . hyoscyamine (LEVBID) 0.375 MG 12 hr tablet Take 1 tablet (0.375 mg total) by mouth every 12 (twelve) hours as needed for cramping.  60 tablet  5  . ibuprofen (ADVIL,MOTRIN) 200 MG tablet Take 400-600 mg by mouth every 8 (eight) hours as needed for moderate pain.       . montelukast (SINGULAIR) 10 MG tablet Take 10 mg by mouth every morning.      . Multiple Vitamin (MULTIVITAMIN) tablet Take 1 tablet by mouth daily.      Bertram Gala. Polyethyl Glycol-Propyl Glycol (SYSTANE) 0.4-0.3 % SOLN Apply 1 drop to eye 3 (three) times daily as needed (dry  eyes).      . pregabalin (LYRICA) 150 MG capsule Take 1 capsule (150 mg total) by mouth 3 (three) times daily.  270 capsule  1  . pseudoephedrine-guaifenesin (MUCINEX D) 60-600 MG per tablet Take 1 tablet by mouth every 12 (twelve) hours.  18 tablet  0  . Red Yeast Rice 600 MG CAPS Take 600 mg by mouth daily.      . SUMAtriptan (IMITREX) 100 MG tablet Take 1 tablet (100 mg total) by mouth every 2 (two) hours as needed for migraine.  4 tablet  11  . SYNTHROID 50 MCG tablet Take 1 tablet (50 mcg total) by mouth daily before breakfast.  90 tablet  1  . telmisartan-hydrochlorothiazide (MICARDIS HCT) 80-12.5 MG per tablet Take 1 tablet by mouth every evening.       . traMADol (ULTRAM) 50 MG tablet Take 1 tablet (50 mg total) by mouth 4 (four) times daily.  120 tablet  2  . vitamin C (ASCORBIC ACID) 500 MG tablet Take 1,000 mg by mouth daily.      Marland Kitchen. amphetamine-dextroamphetamine (ADDERALL) 7.5 MG tablet Take 1 tablet (7.5 mg total) by mouth daily. May fill on or after 04/17/14  30 tablet  0  . chlorpheniramine-HYDROcodone (TUSSIONEX PENNKINETIC ER) 10-8 MG/5ML LQCR Take 5 mLs by mouth every 12 (twelve) hours as needed.  60 mL  0  . mometasone (NASONEX) 50 MCG/ACT nasal spray Place 2 sprays into the nose daily.  17 g  11  . ranitidine (ZANTAC) 150 MG tablet Take 150 mg by mouth 2 (two) times daily as needed for heartburn.        No results found for this or any previous visit (from the past 48 hour(s)). No results found.  Review of Systems  Constitutional: Negative.   HENT: Negative.   Eyes: Negative.   Respiratory: Negative.   Cardiovascular: Negative.   Gastrointestinal: Positive for abdominal pain.  Genitourinary: Negative.   Musculoskeletal: Negative.   Skin: Negative.   Neurological: Negative.   Endo/Heme/Allergies: Negative.   Psychiatric/Behavioral: Negative.     Blood pressure 123/71, pulse 98, temperature 98 F (36.7 C), temperature source Oral, resp. rate 18, SpO2  95.00%. Physical Exam  Vitals reviewed. Constitutional: She is oriented to person, place, and time. She appears well-developed and well-nourished.  HENT:  Head: Normocephalic and atraumatic.  Right Ear: External ear normal.  Left Ear: External ear normal.  Eyes: Conjunctivae are normal. Pupils are equal, round, and reactive to light. No scleral icterus.  Neck:  Normal range of motion. Neck supple. No tracheal deviation present. No thyromegaly present.  Cardiovascular: Normal rate, regular rhythm and normal heart sounds.   Respiratory: Effort normal and breath sounds normal. She has no wheezes.  GI: Soft. Bowel sounds are normal. She exhibits no distension. There is no tenderness.  Ventral hernia in midline cephalad to umbilicus  Musculoskeletal: Normal range of motion. She exhibits no edema.  Neurological: She is alert and oriented to person, place, and time.  Skin: Skin is warm and dry.  Psychiatric: She has a normal mood and affect. Her behavior is normal.     Assessment/Plan Ventral hernia, incarcerated  Plan lap ventral hernia repair with mesh  The risks and benefits of the procedure have been discussed at length with the patient.  The patient understands the proposed procedure, potential alternative treatments, and the course of recovery to be expected.  All of the patient's questions have been answered at this time.  The patient wishes to proceed with surgery.  Velora Heckler, MD, [facility] Office: [phone]    GERKIN,TODD M 03/28/2014, 11:19 AM

## 2014-03-28 NOTE — Op Note (Signed)
[name]:  RAPP-Gloria Carter, Gloria Carter         ACCOUNT NO.:  [id number]  MEDICAL RECORD NO.:  [id number]  LOCATION:  1526                         [facility]  PHYSICIAN:  Velora Hecklerodd M Gerkin, MD      DATE OF BIRTH:  [birth year]  DATE OF PROCEDURE:  03/28/2014                               OPERATIVE REPORT   PREOPERATIVE DIAGNOSIS:  Incarcerated ventral hernia.  POSTOPERATIVE DIAGNOSIS:  Incarcerated ventral hernia.  PROCEDURE:  Laparoscopic ventral hernia repair with mesh (17 x 22 cm Bard Ventralex ST).  SURGEON:  Velora Hecklerodd M Gerkin, MD, FACS  ANESTHESIA:  General per Dr. Lucille PassyAlexander Fortune.  ESTIMATED BLOOD LOSS:  Minimal.  PREPARATION:  ChloraPrep.  COMPLICATIONS:  None.  INDICATIONS:  The patient is a 48 year old female referred by her primary care physician for evaluation of incarcerated abdominal wall hernia.  The patient had first been noted to have a hernia 2 years previous to surgery.  She developed a chronic cough.  The bulge in the anterior abdominal wall had gradually increased in size and had become inferior reducible over the past several months.  She had not developed any signs or symptoms of obstruction.  An ultrasound examination of the abdomen performed on January 10, 2014, shows a 16-cm hernia sac containing adipose tissue and bowel.  The patient now comes to Surgery for laparoscopic repair.  BODY OF REPORT:  [facility].  The patient was brought to the operating room, placed in a supine position on the operating room table.  Following administration of general anesthesia, the patient was positioned and then prepped and draped in the usual aseptic fashion.  After ascertaining that an adequate level of anesthesia had been achieved, an incision was made at the left lateral costal margin.  Using a 5-mm Optiview trocar, the peritoneal cavity was accessed under direct vision. Pneumoperitoneum was established.  Laparoscope was  introduced.  There was a moderately large central ventral hernia containing incarcerated omentum and transverse colon.  Additional operative ports were placed in the left lower quadrant, right lower quadrant, and right upper quadrant. Using gentle traction with a Glassman clamp, the hernia was partially reduced.  The transverse colon was delivered out of the defect and back into the peritoneal cavity.  Adhesions around the edges of the hernia defect were divided sharply with the scissors.  Judicious use of the electrocautery was made.  The entire hernia was completely reduced back within the peritoneal cavity being a large part incarcerated omentum. The remaining adhesions were divided sharply under direct vision, allowing for complete reduction of the hernia.  Next, the margins of the hernia defect were delineated using a spinal needle.  On palpation, there was also a significant hernia defect beneath the umbilicus.  A 17 cm x 22 cm sheet of Bard Ventralex ST mesh was selected so as to have at least a 5-cm overlap circumferentially around the fascial defects.  The mesh was prepared with #1 Novafil sutures placed circumferentially at 8 different positions around the mesh.  The mesh was then moistened, rolled, and inserted under direct vision into the peritoneal cavity where it was deployed and oriented. Incisions were made with an #11 blade  and using the EndoCatch, the suture tags were retrieved and brought through the abdominal wall at all 8 points around the mesh.  After retrieving all of the suture tacks, the sutures were pulled taut elevating the mesh against the abdominal wall with broad overlap of the fascial defects.  All 8 sutures were then tied securely and the tails excised and discarded.  Next, a secure strap was used to affix the mesh to the anterior abdominal wall.  This was done with 2 concentric circles of fixation. There was broad coverage of the fascial defect and good  approximation of the mesh to the anterior abdominal wall.  Abdomen was inspected with the laparoscope for good hemostasis. Pneumoperitoneum was released.  All ports were removed.  Laparoscope was removed.  Port sites were anesthetized with local anesthetic and skin incisions closed with interrupted 4-0 Monocryl subcuticular sutures. Wounds were washed and dried and benzoin and Steri-Strips were applied. Sterile dressings were applied.  The patient was awakened from anesthesia, and a 12 inch abdominal binder was placed around the patient's torso upon moving off the operating room table.  The patient was awakened from anesthesia and taken to the recovery room.  The patient tolerated the procedure well.   Velora Hecklerodd M. Gerkin, MD, [facility] Office: [phone]    TMG/MEDQ  D:  03/28/2014  T:  03/28/2014  Job:  [id number]  cc:   Norberto SorensonEva Shaw, MD Fax: [phone]  Benny LennertSarah Weber, PA-C

## 2014-03-28 NOTE — Anesthesia Preprocedure Evaluation (Addendum)
Anesthesia Evaluation  Patient identified by MRN, date of birth, ID band Patient awake    Reviewed: Allergy & Precautions, H&P , NPO status , Patient's Chart, lab work & pertinent test results  Airway Mallampati: III TM Distance: >3 FB Neck ROM: Full    Dental  (+) Teeth Intact, Dental Advisory Given   Pulmonary asthma ,  breath sounds clear to auscultation  Pulmonary exam normal       Cardiovascular hypertension, Pt. on medications negative cardio ROS  Rhythm:Regular Rate:Normal     Neuro/Psych  Headaches, Depression  Neuromuscular disease    GI/Hepatic negative GI ROS, Neg liver ROS, GERD-  Medicated,  Endo/Other  negative endocrine ROSHypothyroidism Morbid obesity  Renal/GU negative Renal ROS  negative genitourinary   Musculoskeletal negative musculoskeletal ROS (+) Fibromyalgia -  Abdominal   Peds negative pediatric ROS (+)  Hematology negative hematology ROS (+)   Anesthesia Other Findings   Reproductive/Obstetrics                          Anesthesia Physical Anesthesia Plan  ASA: II  Anesthesia Plan: General   Post-op Pain Management:    Induction: Intravenous  Airway Management Planned: Oral ETT  Additional Equipment:   Intra-op Plan:   Post-operative Plan: Extubation in OR  Informed Consent: I have reviewed the patients History and Physical, chart, labs and discussed the procedure including the risks, benefits and alternatives for the proposed anesthesia with the patient or authorized representative who has indicated his/her understanding and acceptance.   Dental advisory given  Plan Discussed with: CRNA  Anesthesia Plan Comments:         Anesthesia Quick Evaluation

## 2014-03-28 NOTE — Brief Op Note (Signed)
03/28/2014  2:06 PM  PATIENT:  Britta MccreedyBarbara Rapp-Austin  48 y.o. female  PRE-OPERATIVE DIAGNOSIS:  incarcerated ventral hernia  POST-OPERATIVE DIAGNOSIS:  same  PROCEDURE:  Procedure(s): LAPAROSCOPIC VENTRAL HERNIA REPAIR WITH MESH (N/A) INSERTION OF MESH (N/A)  SURGEON:  Surgeon(s) and Role:    * Velora Hecklerodd M Gerkin, MD - Primary  ANESTHESIA:   general  EBL:  Total I/O In: 1200 [I.V.:1200] Out: 15 [Urine:10; Blood:5]  BLOOD ADMINISTERED:none  DRAINS: none   LOCAL MEDICATIONS USED:  MARCAINE     SPECIMEN:  No Specimen  DISPOSITION OF SPECIMEN:  N/A  COUNTS:  YES  TOURNIQUET:  * No tourniquets in log *  DICTATION: .Other Dictation: Dictation Number [id number]  PLAN OF CARE: Admit for overnight observation  PATIENT DISPOSITION:  PACU - hemodynamically stable.   Delay start of Pharmacological VTE agent (>24hrs) due to surgical blood loss or risk of bleeding: yes  Velora Hecklerodd M. Gerkin, MD, [facility] Office: [phone]

## 2014-03-28 NOTE — Transfer of Care (Signed)
Immediate Anesthesia Transfer of Care Note  Patient: Gloria Carter  Procedure(s) Performed: Procedure(s): LAPAROSCOPIC VENTRAL HERNIA REPAIR WITH MESH (N/A) INSERTION OF MESH (N/A)  Patient Location: PACU  Anesthesia Type:General  Level of Consciousness: awake, alert , oriented and patient cooperative  Airway & Oxygen Therapy: Patient Spontanous Breathing and Patient connected to face mask oxygen  Post-op Assessment: Report given to PACU RN and Post -op Vital signs reviewed and stable  Post vital signs: stable  Complications: No apparent anesthesia complications

## 2014-03-29 ENCOUNTER — Encounter (HOSPITAL_COMMUNITY): Payer: Self-pay | Admitting: Surgery

## 2014-03-29 MED ORDER — KETOROLAC TROMETHAMINE 15 MG/ML IJ SOLN
15.0000 mg | Freq: Four times a day (QID) | INTRAMUSCULAR | Status: AC
  Administered 2014-03-29 – 2014-03-30 (×5): 15 mg via INTRAVENOUS
  Filled 2014-03-29 (×5): qty 1

## 2014-03-29 MED ORDER — LORATADINE 10 MG PO TABS
10.0000 mg | ORAL_TABLET | Freq: Every day | ORAL | Status: DC | PRN
  Filled 2014-03-29: qty 1

## 2014-03-29 MED ORDER — PANTOPRAZOLE SODIUM 40 MG PO TBEC
40.0000 mg | DELAYED_RELEASE_TABLET | Freq: Every day | ORAL | Status: DC
  Administered 2014-03-29 – 2014-03-31 (×3): 40 mg via ORAL
  Filled 2014-03-29 (×3): qty 1

## 2014-03-29 NOTE — Progress Notes (Signed)
UR Completed Brenda Graves-Bigelow, RN,BSN [phone]  

## 2014-03-29 NOTE — Discharge Instructions (Signed)
  [facility]  LAPAROSCOPIC SURGERY - POST-OP INSTRUCTIONS  Always review your discharge instruction sheet given to you by the facility where your surgery was performed.  A prescription for pain medication may be given to you upon discharge.  Take your pain medication as prescribed.  If narcotic pain medicine is not needed, then you may take acetaminophen (Tylenol) or ibuprofen (Advil) as needed.  Take your usually prescribed medications unless otherwise directed.  If you need a refill on your pain medication, please contact your pharmacy.  They will contact our office to request authorization. Prescriptions will not be filled after 5 P.M. or on weekends.  You should follow a light diet the first few days after arrival home, such as soup and crackers or toast.  Be sure to include plenty of fluids daily.  Most patients will experience some swelling and bruising in the area of the incisions.  Ice packs will help.  Swelling and bruising can take several days to resolve.   It is common to experience some constipation if taking pain medication after surgery.  Increasing fluid intake and taking a stool softener (such as Colace) will usually help or prevent this problem from occurring.  A mild laxative (Milk of Magnesia or Miralax) should be taken according to package instructions if there are no bowel movements after 48 hours.  Unless discharge instructions indicate otherwise, you may remove your bandages 24-48 hours after surgery, and you may shower at that time.  You may have steri-strips (small skin tapes) in place directly over the incision.  These strips should be left on the skin for 7-10 days.  If your surgeon used skin glue on the incision, you may shower in 24 hours.  The glue will flake off over the next 2-3 weeks.  Any sutures or staples will be removed at the office during your follow-up visit.  ACTIVITIES:  You may resume regular (light) daily activities beginning the  next day-such as daily self-care, walking, climbing stairs-gradually increasing activities as tolerated.  You may have sexual intercourse when it is comfortable.  Refrain from any heavy lifting or straining until approved by your doctor.  You may drive when you are no longer taking prescription pain medication, you can comfortably wear a seatbelt, and you can safely maneuver your car and apply brakes.  You should see your doctor in the office for a follow-up appointment approximately 2-3 weeks after your surgery.  Make sure that you call for this appointment within a day or two after you arrive home to insure a convenient appointment time.  WHEN TO CALL YOUR DOCTOR: 1. Fever over 101.0 2. Inability to urinate 3. Continued bleeding from incision 4. Increased pain, redness, or drainage from the incision 5. Increasing abdominal pain  The clinic staff is available to answer your questions during regular business hours.  Please don't hesitate to call and ask to speak to one of the nurses for clinical concerns.  If you have a medical emergency, go to the nearest emergency room or call 911.  A surgeon from [facility] is always on call for the hospital.  Sahirah Rudell M. Kaidan Harpster, MD, FACS [facility] Office: [phone] Toll Free:  [phone] FAX [phone]  Web site: [email or web address] 

## 2014-03-29 NOTE — Progress Notes (Signed)
Patient ID: Gloria GrovesBarbara Carter, female   DOB: 1966-06-14, 48 y.o.   MRN: [id number]  [facility]  POD# 1  Subjective: Patient with moderate pain.  Taking dilaudid IV. Has not been out of bed.  Family at bedside.  Tolerating clear liquids - wants to go to full liquids.  Objective: Vital signs in last 24 hours: Temp:  [97.4 F (36.3 C)-98.8 F (37.1 C)] 98.8 F (37.1 C) (06/19 0537) Pulse Rate:  [65-109] 96 (06/19 0537) Resp:  [13-23] 16 (06/19 0537) BP: (117-183)/(69-95) 131/75 mmHg (06/19 0537) SpO2:  [91 %-100 %] 93 % (06/19 0537) Weight:  [297 lb (134.718 kg)] 297 lb (134.718 kg) (06/18 1842) Last BM Date: 03/27/14  Intake/Output from previous day: 06/18 0701 - 06/19 0700 In: 2064.2 [P.O.:120; I.V.:1944.2] Out: 1890 [Urine:1885; Blood:5]  Exam: HEENT - clear, not icteric Neck - soft Chest - clear bilaterally Cor - RRR, no murmur Abd - dressings dry and intact; binder replaced Ext - no significant edema Neuro - grossly intact, no focal deficits  Lab Results:  No results found for this basename: WBC, HGB, HCT, PLT,  in the last 72 hours  No results found for this basename: NA, K, CL, CO2, GLUCOSE, BUN, CREATININE, CALCIUM,  in the last 72 hours  Studies/Results: No results found.  Assessment / Plan: 1.  Status post lap ventral hernia repair with mesh  Advance to full liquid diet  Add Toradol IV for pain control  Encouraged OOB, ambulation  Abd binder  Home 1-2 days  Velora Hecklerodd M. Gerkin, MD, [facility] Office: [phone]  03/29/2014

## 2014-03-30 LAB — BASIC METABOLIC PANEL
BUN: 25 mg/dL — AB (ref 6–23)
CO2: 27 mEq/L (ref 19–32)
Calcium: 9 mg/dL (ref 8.4–10.5)
Chloride: 92 mEq/L — ABNORMAL LOW (ref 96–112)
Creatinine, Ser: 1.48 mg/dL — ABNORMAL HIGH (ref 0.50–1.10)
GFR calc Af Amer: 47 mL/min — ABNORMAL LOW (ref >90)
GFR, EST NON AFRICAN AMERICAN: 40 mL/min — AB (ref >90)
GLUCOSE: 131 mg/dL — AB (ref 70–99)
Potassium: 4.6 mEq/L (ref 3.7–5.3)
Sodium: 132 mEq/L — ABNORMAL LOW (ref 137–147)

## 2014-03-30 MED ORDER — LACTATED RINGERS IV BOLUS (SEPSIS)
1000.0000 mL | Freq: Once | INTRAVENOUS | Status: AC
  Administered 2014-03-30: 1000 mL via INTRAVENOUS

## 2014-03-30 NOTE — Progress Notes (Signed)
Low urine output - S Gross,MD notified and order received for 1 liter  LR bolus .

## 2014-03-30 NOTE — Progress Notes (Signed)
Patient ID: Gloria Carter, female   DOB: 10-Oct-1966, 48 y.o.   MRN: [id number] [facility] Progress Note:   2 Days Post-Op  Subjective: Mental status is clear but patient lying with K pad behind head;  Not moving too much.  Hungry for regular food Objective: Vital signs in last 24 hours: Temp:  [98 F (36.7 C)-98.5 F (36.9 C)] 98.2 F (36.8 C) (06/20 0422) Pulse Rate:  [85-94] 85 (06/20 0422) Resp:  [16-17] 17 (06/20 0422) BP: (93-155)/(60-73) 114/73 mmHg (06/20 0422) SpO2:  [89 %-95 %] 95 % (06/20 0422)  Intake/Output from previous day: 06/19 0701 - 06/20 0700 In: 1560 [P.O.:360; I.V.:1200] Out: 800 [Urine:800] Intake/Output this shift:    Physical Exam: Work of breathing is normal.  Moving minimally.  Not ready to go home.    Lab Results:  Results for orders placed during the hospital encounter of 03/28/14 (from the past 48 hour(s))  BASIC METABOLIC PANEL     Status: Abnormal   Collection Time    03/30/14  5:15 AM      Result Value Ref Range   Sodium 132 (*) 137 - 147 mEq/L   Potassium 4.6  3.7 - 5.3 mEq/L   Chloride 92 (*) 96 - 112 mEq/L   CO2 27  19 - 32 mEq/L   Glucose, Bld 131 (*) 70 - 99 mg/dL   BUN 25 (*) 6 - 23 mg/dL   Creatinine, Ser 1.48 (*) 0.50 - 1.10 mg/dL   Calcium 9.0  8.4 - 10.5 mg/dL   GFR calc non Af Amer 40 (*) >90 mL/min   GFR calc Af Amer 47 (*) >90 mL/min   Comment: (NOTE)     The eGFR has been calculated using the CKD EPI equation.     This calculation has not been validated in all clinical situations.     eGFR's persistently <90 mL/min signify possible Chronic Kidney     Disease.    Radiology/Results: No results found.  Anti-infectives: Anti-infectives   Start     Dose/Rate Route Frequency Ordered Stop   03/28/14 0600  ceFAZolin (ANCEF) 3 g in dextrose 5 % 50 mL IVPB     3 g 160 mL/hr over 30 Minutes Intravenous On call to O.R. 03/27/14 1600 03/28/14 1145      Assessment/Plan: Problem List: Patient Active Problem  List   Diagnosis Date Noted  . Incarcerated ventral hernia 03/28/2014  . Obesity, morbid, BMI 50 or higher 02/15/2014  . Prediabetes 09/08/2013  . Unspecified hypothyroidism 08/31/2013  . Asthma, chronic 08/31/2013  . Upper airway cough syndrome 08/31/2013  . Carpal tunnel syndrome 06/01/2013  . Wrist tendonitis 06/01/2013  . Encounter for long-term (current) use of other medications 12/01/2012  . HTN (hypertension) 08/13/2012  . BMI 45.0-49.9, adult 08/13/2012  . Hyperlipidemia 08/13/2012  . IBS (irritable bowel syndrome) 08/13/2012  . ADD (attention deficit disorder) 02/07/2012  . GERD (gastroesophageal reflux disease) 02/07/2012  . Migraine 02/07/2012  . Dysphagia 02/07/2012  . Hernia of abdominal wall 02/07/2012  . RAD (reactive airway disease) 12/20/2011  . Fibromyalgia 12/20/2011  . Chronic pain 12/20/2011    Advance to regular diet.  Hopeful discharge tomorrow.  2 Days Post-Op    LOS: 2 days   Matt B. Hassell Done, MD, [facility] [phone] beeper [phone]  03/30/2014 8:41 AM

## 2014-03-31 MED ORDER — HYDROCODONE-ACETAMINOPHEN 5-325 MG PO TABS: 1.0000 | ORAL_TABLET | ORAL | Status: DC | PRN

## 2014-03-31 NOTE — Discharge Summary (Signed)
Physician Discharge Summary  Patient ID: Gloria Carter MRN: [id number] DOB/AGE: 06-24-1966 48 y.o.  Admit date: 03/28/2014 Discharge date: 03/31/2014  Admission Diagnoses:  Ventral hernia  Discharge Diagnoses:  Same post repair  Principal Problem:   Hernia of abdominal wall Active Problems:   Incarcerated ventral hernia   Surgery:  Laparoscopic repair of ventral hernia  Discharged Condition: improved  Hospital Course:   Had surgery.  Slow to get up and get moving which may be related to underlying fibromyalgia.  Wearing abdominal binder.  Ready for discharge on Sunday  Consults: none  Significant Diagnostic Studies: none    Discharge Exam: Blood pressure 131/76, pulse 92, temperature 98.6 F (37 C), temperature source Oral, resp. rate 18, height 5\' 4"  (1.626 m), weight 297 lb (134.718 kg), SpO2 90.00%. Dressings over lap incisions.  Ready for discharge  Disposition: Final discharge disposition not confirmed  Discharge Instructions   Diet - low sodium heart healthy    Complete by:  As directed      Discharge instructions    Complete by:  As directed   May remove dressings and shower today.     Increase activity slowly    Complete by:  As directed      No dressing needed    Complete by:  As directed             Medication List         albuterol 108 (90 BASE) MCG/ACT inhaler  Commonly known as:  PROVENTIL HFA;VENTOLIN HFA  Inhale 2 puffs into the lungs every 6 (six) hours as needed for wheezing or shortness of breath.     amoxicillin-clavulanate 875-125 MG per tablet  Commonly known as:  AUGMENTIN  Take 1 tablet by mouth 2 (two) times daily.     amphetamine-dextroamphetamine 7.5 MG tablet  Commonly known as:  ADDERALL  Take 1 tablet (7.5 mg total) by mouth daily. May fill on or after 02/15/14.     amphetamine-dextroamphetamine 7.5 MG tablet  Commonly known as:  ADDERALL  Take 1 tablet (7.5 mg total) by mouth daily. May fill on or after 04/17/14     B-COMPLEX BALANCED PO  Take 1 tablet by mouth daily.     calcium carbonate 600 MG Tabs tablet  Commonly known as:  OS-CAL  Take 600 mg by mouth daily.     carisoprodol 350 MG tablet  Commonly known as:  SOMA  Take 1 tablet (350 mg total) by mouth 4 (four) times daily as needed for muscle spasms.     cetirizine 10 MG tablet  Commonly known as:  ZYRTEC  Take 10 mg by mouth daily as needed for allergies.     chlorpheniramine-HYDROcodone 10-8 MG/5ML Lqcr  Commonly known as:  TUSSIONEX PENNKINETIC ER  Take 5 mLs by mouth every 12 (twelve) hours as needed.     cholecalciferol 400 UNITS Tabs tablet  Commonly known as:  VITAMIN D  Take 400 Units by mouth daily.     citalopram 10 MG tablet  Commonly known as:  CELEXA  Take 10 mg by mouth every evening.     esomeprazole 40 MG capsule  Commonly known as:  NEXIUM  Take 1 capsule (40 mg total) by mouth daily.     Fluticasone-Salmeterol 100-50 MCG/DOSE Aepb  Commonly known as:  ADVAIR  Inhale 1 puff into the lungs 2 (two) times daily.     guaiFENesin 600 MG 12 hr tablet  Commonly known as:  MUCINEX  Take 1,200 mg  by mouth daily as needed for cough.     HYDROcodone-acetaminophen 5-325 MG per tablet  Commonly known as:  NORCO/VICODIN  Take 1-2 tablets by mouth every 4 (four) hours as needed for moderate pain.     hyoscyamine 0.375 MG 12 hr tablet  Commonly known as:  LEVBID  Take 1 tablet (0.375 mg total) by mouth every 12 (twelve) hours as needed for cramping.     ibuprofen 200 MG tablet  Commonly known as:  ADVIL,MOTRIN  Take 400-600 mg by mouth every 8 (eight) hours as needed for moderate pain.     mometasone 50 MCG/ACT nasal spray  Commonly known as:  NASONEX  Place 2 sprays into the nose daily.     montelukast 10 MG tablet  Commonly known as:  SINGULAIR  Take 10 mg by mouth every morning.     multivitamin tablet  Take 1 tablet by mouth daily.     pregabalin 150 MG capsule  Commonly known as:  LYRICA  Take 1  capsule (150 mg total) by mouth 3 (three) times daily.     pseudoephedrine-guaifenesin 60-600 MG per tablet  Commonly known as:  MUCINEX D  Take 1 tablet by mouth every 12 (twelve) hours.     ranitidine 150 MG tablet  Commonly known as:  ZANTAC  Take 150 mg by mouth 2 (two) times daily as needed for heartburn.     Red Yeast Rice 600 MG Caps  Take 600 mg by mouth daily.     SUMAtriptan 100 MG tablet  Commonly known as:  IMITREX  Take 1 tablet (100 mg total) by mouth every 2 (two) hours as needed for migraine.     SYNTHROID 50 MCG tablet  Generic drug:  levothyroxine  Take 1 tablet (50 mcg total) by mouth daily before breakfast.     SYSTANE 0.4-0.3 % Soln  Generic drug:  Polyethyl Glycol-Propyl Glycol  Apply 1 drop to eye 3 (three) times daily as needed (dry eyes).     telmisartan-hydrochlorothiazide 80-12.5 MG per tablet  Commonly known as:  MICARDIS HCT  Take 1 tablet by mouth every evening.     traMADol 50 MG tablet  Commonly known as:  ULTRAM  Take 1 tablet (50 mg total) by mouth 4 (four) times daily.     vitamin C 500 MG tablet  Commonly known as:  ASCORBIC ACID  Take 1,000 mg by mouth daily.           Follow-up Information   Follow up with Velora HecklerGERKIN,TODD M, MD. Schedule an appointment as soon as possible for a visit in 3 weeks.   Specialty:  General Surgery   Contact information:   [address] [place] [address] [phone]       Signed: Valarie MerinoMARTIN,MATTHEW B 03/31/2014, 9:53 AM

## 2014-03-31 NOTE — Progress Notes (Signed)
Assessment unchanged. Pt verbalized understanding of dc instructions through teach back. Script x 1 given as provided by MD. Already has My Chart account. Discharged via wc to front entrance to meet awaiting vehicle to carry home. Accompanied by NT, husband and dad.

## 2014-04-03 ENCOUNTER — Telehealth (INDEPENDENT_AMBULATORY_CARE_PROVIDER_SITE_OTHER): Payer: Self-pay

## 2014-04-03 NOTE — Telephone Encounter (Signed)
Pt is s/p ventral hernia repair on 03/28/14 by Dr. Gerrit FriendsGerkin.  She is calling c/o intense abdominal pain.  She is voiding, passing gas and having bowel movements.  No fever or chills.  Pt did state that she has a "low pain tolerance".  She is concerned and wanted reassurance that this was normal pain for this type of surgery. She is taking her pain medication every 4 hours and says she gets some relief.  I suggested she try taking Ibuprofen 200 mg q 6 hrs in addition.  If her pain becomes uncontrollable she should go to the ED.  Pt will call if she needs a letter for her job stating she will be out of work until after 7/21 when she sees Dr. Gerrit FriendsGerkin.  Pt understood all directions and agreed with the POC.

## 2014-04-08 ENCOUNTER — Telehealth (INDEPENDENT_AMBULATORY_CARE_PROVIDER_SITE_OTHER): Payer: Self-pay

## 2014-04-08 ENCOUNTER — Other Ambulatory Visit (INDEPENDENT_AMBULATORY_CARE_PROVIDER_SITE_OTHER): Payer: Self-pay

## 2014-04-08 DIAGNOSIS — G8918 Other acute postprocedural pain: Secondary | ICD-10-CM

## 2014-04-08 MED ORDER — HYDROCODONE-ACETAMINOPHEN 5-325 MG PO TABS: 1.0000 | ORAL_TABLET | ORAL | Status: DC | PRN

## 2014-04-08 NOTE — Telephone Encounter (Signed)
Per Dr Colette RibasGerkin RX written for hydrocodone #20. Signed by Dr Daphine DeutscherMartin and at front desk for pick up. Pt aware.

## 2014-04-08 NOTE — Telephone Encounter (Signed)
OK to offer one additional Rx of hydrocodone 5mg  #20 to this patient.  She will need to pick up Rx at office.  Thanks,  tmg  Velora Hecklerodd M. Gerkin, MD, [facility] Office: [phone]

## 2014-04-08 NOTE — Telephone Encounter (Signed)
Pt called requesting refill of her pain med. Pt offered protocol tramadol. Pt states she is already taking this for her fibromyalgia along with the hydrocodone for her hernia pain. Pt states she is still very sore. Level 5-8. No redness,swelling for fever. BM daily. Voiding well. Pt advised this request will be sent to Dr Gerrit FriendsGerkin for review. Pt can be reached at [phone] or [phone].

## 2014-04-10 ENCOUNTER — Other Ambulatory Visit: Payer: Self-pay | Admitting: Family Medicine

## 2014-04-10 NOTE — Telephone Encounter (Signed)
Called [facility] bc Rx was written at [facility] 02/15/14 w/RFs. [facility] reported they do not have that on file. Pt last filled Rx on 02/11/14 from one written in Jan. Called pt and she stated that she may have the Rx that was given to her at home and will check for it. Pt will CB if she does NOT have it.

## 2014-04-17 ENCOUNTER — Encounter (INDEPENDENT_AMBULATORY_CARE_PROVIDER_SITE_OTHER): Payer: Self-pay | Admitting: Surgery

## 2014-04-17 ENCOUNTER — Ambulatory Visit (INDEPENDENT_AMBULATORY_CARE_PROVIDER_SITE_OTHER): Payer: 59 | Admitting: Surgery

## 2014-04-17 VITALS — BP 128/80 | HR 83 | Temp 98.4°F | Ht 64.0 in | Wt 285.0 lb

## 2014-04-17 DIAGNOSIS — K439 Ventral hernia without obstruction or gangrene: Secondary | ICD-10-CM

## 2014-04-17 NOTE — Progress Notes (Signed)
General Surgery [facility]  Chief Complaint  Patient presents with  . Routine Post Op    lap VH repair with mesh 03/28/2014    HISTORY: The patient is a 48 year old female who underwent laparoscopic ventral hernia repair with mesh. Postoperative course has been relatively uneventful. She is continuing to have moderate lower abdominal pain which is limiting her mobility. She is having loose bowel movements. Her appetite is poor. She states she has lost approximately 12 pounds.  EXAM: Abdomen is soft without distention. Bowel sounds are present. Surgical wounds are well healed. No sign of herniation. Moderate seroma in the old hernia sac. No sign of infection.  IMPRESSION: Status post laparoscopic ventral hernia repair with mesh  PLAN: Patient has limited mobility at this point in time. She is still having some lower abdominal pain.  Patient is going to go to the medical supply today to obtain a new abdominal binder. She should continue to wear this during the day but may take it off at nighttime.  Patient is unlikely to be able to return to work on July 20. We will plan to extend her out of work time until August 1.  Patient will return to see me in 6 weeks for final wound check.  Velora Hecklerodd M. Pearle Wandler, MD, FACS General & Endocrine Surgery [facility]   Visit Diagnoses: 1. Hernia of abdominal wall

## 2014-04-17 NOTE — Patient Instructions (Signed)
  CARE OF INCISION   Apply cocoa butter/vitamin E cream (Palmer's brand) to your incision 2 - 3 times daily.  Massage cream into incision for one minute with each application.  Use sunscreen (50 SPF or higher) for first 6 months after surgery if area is exposed to sun.  You may alternate Mederma or other scar reducing cream with cocoa butter cream if desired.       Jaycion Treml M. Tytan Sandate, MD, FACS      [facility]      Office: [phone]    

## 2014-04-20 ENCOUNTER — Encounter: Payer: Self-pay | Admitting: Family Medicine

## 2014-04-22 ENCOUNTER — Ambulatory Visit: Payer: 59 | Admitting: Dietician

## 2014-04-24 ENCOUNTER — Telehealth (INDEPENDENT_AMBULATORY_CARE_PROVIDER_SITE_OTHER): Payer: Self-pay

## 2014-04-24 NOTE — Telephone Encounter (Signed)
LMOM for pt to call. Per Dr Gerrit FriendsGerkin pt to let us know if she wants to rtw 7-20 or does she want to rtw 8-1. Per Dr Gerrit FriendsGerkin we can do rtw letter for pt either date per pts request.

## 2014-04-30 ENCOUNTER — Encounter (INDEPENDENT_AMBULATORY_CARE_PROVIDER_SITE_OTHER): Payer: 59 | Admitting: Surgery

## 2014-05-09 ENCOUNTER — Telehealth (INDEPENDENT_AMBULATORY_CARE_PROVIDER_SITE_OTHER): Payer: Self-pay

## 2014-05-09 ENCOUNTER — Encounter (INDEPENDENT_AMBULATORY_CARE_PROVIDER_SITE_OTHER): Payer: Self-pay

## 2014-05-09 NOTE — Telephone Encounter (Signed)
Pt called back requesting note to rtw 05-11-14. [facility] and [facility]. Disb forms are still in process and awaiting Dr Gerrit FriendsGerkin to sign next week.

## 2014-05-09 NOTE — Telephone Encounter (Signed)
Pt called stating she had a panic attack yesterday and she thinks her fibromyalgia is keeping her from being able to return to work. Pt wants to be extended out of work. Pt is scheduled to return to work 05-11-14. Pt states wound clean,dry,well healed. No fever or swelling with abd incision. Pt advised we do not treat fibromyalgia or panic attacks. Pt advised she needs to see her PCP that treats her fibromyalgia to evaluate this and her panic attacks. Pt advised if she is having a problem with her surgical wd we will be glad to see her today in our urgent office. Pt states they would just laugh at her. I stressed to pt our doctors would care for her if she would like them to assess her wound. Pt declined. I stressed to pt importance of following up with her PCP re: anxiety. Pt states she understands.

## 2014-05-24 ENCOUNTER — Other Ambulatory Visit: Payer: Self-pay

## 2014-05-24 DIAGNOSIS — M797 Fibromyalgia: Secondary | ICD-10-CM

## 2014-05-24 NOTE — Telephone Encounter (Signed)
Pharm reqs RF of tramadol. Pended. 

## 2014-05-27 ENCOUNTER — Encounter (INDEPENDENT_AMBULATORY_CARE_PROVIDER_SITE_OTHER): Payer: Self-pay | Admitting: Surgery

## 2014-05-27 ENCOUNTER — Ambulatory Visit (INDEPENDENT_AMBULATORY_CARE_PROVIDER_SITE_OTHER): Payer: 59 | Admitting: Surgery

## 2014-05-27 VITALS — BP 134/84 | HR 93 | Temp 97.1°F | Ht 64.0 in | Wt 287.0 lb

## 2014-05-27 DIAGNOSIS — K439 Ventral hernia without obstruction or gangrene: Secondary | ICD-10-CM

## 2014-05-27 NOTE — Progress Notes (Signed)
General Surgery [facility]  Chief Complaint  Patient presents with  . Routine Post Op    lap ventral hernia repair 03/28/2014    HISTORY: Patient is a 48 year old female who underwent laparoscopic ventral hernia repair with mesh. Postoperative course has been relatively uneventful. She is eating a regular diet. She is having normal bowel movements.  Patient complains of pain over the rectus musculature bilaterally. She notes discomfort particularly in the left lower quadrant.  EXAM: Incisions are well-healed with no sign of infection. Small seroma remains at the site of the hernia. With Valsalva and a standing position there is no sign of recurrent hernia.  IMPRESSION: Status post laparoscopic ventral incisional hernia repair with mesh  PLAN: Patient is released to full activity without restriction. She may wear an abdominal binder for comfort.  Patient is reassured that her repair is intact and that there are no complications at this time.  Patient will return for surgical care as needed  Velora Hecklerodd M. Remi Rester, MD, FACS General & Endocrine Surgery [facility]   Visit Diagnoses: 1. Hernia of abdominal wall

## 2014-05-28 MED ORDER — TRAMADOL HCL 50 MG PO TABS: 50.0000 mg | ORAL_TABLET | Freq: Four times a day (QID) | ORAL | Status: DC

## 2014-05-28 NOTE — Telephone Encounter (Signed)
Faxed

## 2014-05-31 ENCOUNTER — Encounter: Payer: Self-pay | Admitting: Family Medicine

## 2014-05-31 ENCOUNTER — Ambulatory Visit (INDEPENDENT_AMBULATORY_CARE_PROVIDER_SITE_OTHER): Payer: 59 | Admitting: Family Medicine

## 2014-05-31 VITALS — BP 137/68 | HR 98 | Temp 97.8°F | Resp 16 | Ht 63.0 in | Wt 285.0 lb

## 2014-05-31 DIAGNOSIS — IMO0001 Reserved for inherently not codable concepts without codable children: Secondary | ICD-10-CM

## 2014-05-31 DIAGNOSIS — R7309 Other abnormal glucose: Secondary | ICD-10-CM

## 2014-05-31 DIAGNOSIS — M797 Fibromyalgia: Secondary | ICD-10-CM

## 2014-05-31 DIAGNOSIS — F988 Other specified behavioral and emotional disorders with onset usually occurring in childhood and adolescence: Secondary | ICD-10-CM

## 2014-05-31 DIAGNOSIS — D649 Anemia, unspecified: Secondary | ICD-10-CM

## 2014-05-31 DIAGNOSIS — I10 Essential (primary) hypertension: Secondary | ICD-10-CM

## 2014-05-31 DIAGNOSIS — Z79899 Other long term (current) drug therapy: Secondary | ICD-10-CM

## 2014-05-31 DIAGNOSIS — G8929 Other chronic pain: Secondary | ICD-10-CM

## 2014-05-31 DIAGNOSIS — R350 Frequency of micturition: Secondary | ICD-10-CM

## 2014-05-31 DIAGNOSIS — E039 Hypothyroidism, unspecified: Secondary | ICD-10-CM

## 2014-05-31 DIAGNOSIS — R7303 Prediabetes: Secondary | ICD-10-CM

## 2014-05-31 LAB — POCT CBC
Granulocyte percent: 56.8 %G (ref 37–80)
HEMATOCRIT: 37.8 % (ref 37.7–47.9)
HEMOGLOBIN: 11.9 g/dL — AB (ref 12.2–16.2)
LYMPH, POC: 3.6 — AB (ref 0.6–3.4)
MCH: 26.7 pg — AB (ref 27–31.2)
MCHC: 31.4 g/dL — AB (ref 31.8–35.4)
MCV: 85.1 fL (ref 80–97)
MID (cbc): 0.8 (ref 0–0.9)
MPV: 9.5 fL (ref 0–99.8)
POC Granulocyte: 5.8 (ref 2–6.9)
POC LYMPH PERCENT: 34.9 %L (ref 10–50)
POC MID %: 8.3 %M (ref 0–12)
Platelet Count, POC: 217 10*3/uL (ref 142–424)
RBC: 4.44 M/uL (ref 4.04–5.48)
RDW, POC: 15.8 %
WBC: 10.2 10*3/uL (ref 4.6–10.2)

## 2014-05-31 LAB — POCT GLYCOSYLATED HEMOGLOBIN (HGB A1C): Hemoglobin A1C: 5.8

## 2014-05-31 LAB — POCT URINALYSIS DIPSTICK
Bilirubin, UA: NEGATIVE
GLUCOSE UA: NEGATIVE
KETONES UA: NEGATIVE
Nitrite, UA: NEGATIVE
Protein, UA: NEGATIVE
SPEC GRAV UA: 1.01
UROBILINOGEN UA: 0.2
pH, UA: 5.5

## 2014-05-31 MED ORDER — CARISOPRODOL 350 MG PO TABS: 350.0000 mg | ORAL_TABLET | Freq: Four times a day (QID) | ORAL | Status: DC | PRN

## 2014-05-31 MED ORDER — CITALOPRAM HYDROBROMIDE 10 MG PO TABS: 10.0000 mg | ORAL_TABLET | Freq: Every evening | ORAL | Status: DC

## 2014-05-31 MED ORDER — TELMISARTAN-HCTZ 80-12.5 MG PO TABS: 1.0000 | ORAL_TABLET | Freq: Every evening | ORAL | Status: DC

## 2014-05-31 MED ORDER — AMPHETAMINE-DEXTROAMPHETAMINE 7.5 MG PO TABS: 7.5000 mg | ORAL_TABLET | Freq: Every day | ORAL | Status: DC

## 2014-05-31 MED ORDER — MONTELUKAST SODIUM 10 MG PO TABS: 10.0000 mg | ORAL_TABLET | Freq: Every morning | ORAL | Status: DC

## 2014-06-01 LAB — COMPREHENSIVE METABOLIC PANEL
ALT: 20 U/L (ref 0–35)
AST: 21 U/L (ref 0–37)
Albumin: 4.1 g/dL (ref 3.5–5.2)
Alkaline Phosphatase: 103 U/L (ref 39–117)
BUN: 13 mg/dL (ref 6–23)
CALCIUM: 9.2 mg/dL (ref 8.4–10.5)
CO2: 30 meq/L (ref 19–32)
Chloride: 99 mEq/L (ref 96–112)
Creat: 0.7 mg/dL (ref 0.50–1.10)
GLUCOSE: 102 mg/dL — AB (ref 70–99)
Potassium: 3.4 mEq/L — ABNORMAL LOW (ref 3.5–5.3)
Sodium: 138 mEq/L (ref 135–145)
Total Bilirubin: 0.3 mg/dL (ref 0.2–1.2)
Total Protein: 7.2 g/dL (ref 6.0–8.3)

## 2014-06-01 LAB — TSH: TSH: 2.189 u[IU]/mL (ref 0.350–4.500)

## 2014-06-02 MED ORDER — CIPROFLOXACIN HCL 500 MG PO TABS: 500.0000 mg | ORAL_TABLET | Freq: Two times a day (BID) | ORAL | Status: DC

## 2014-06-02 MED ORDER — SYNTHROID 50 MCG PO TABS: 50.0000 ug | ORAL_TABLET | Freq: Every day | ORAL | Status: DC

## 2014-06-02 NOTE — Progress Notes (Signed)
Subjective:    Patient ID: Gloria Carter, female    DOB: Feb 19, 1966, 48 y.o.   MRN: [id number]  This chart was scribed for Sherren Mocha, MD by Gwenevere Abbot, ED scribe. This patient was seen in room Room/bed 25 and the patient's care was started at 5:12 PM.  Chief Complaint  Patient presents with  . Follow-up    3 month  . Urinary Frequency    x 1 month      Urinary Frequency  Associated symptoms include frequency and urgency. Pertinent negatives include no chills, nausea or vomiting.   Marland KitchenHPI Comments:  Gloria Carter is a 48 y.o. female who presents to [facility]. Gloria Carter underwent a ventral hernia repair 3 months ago. She saw her surgeon earlier this week who released her to full activity without restriction, so she has recently returned to work. Her last labs were a metabolic panel at the time of surgery that showed that Creatine had bumped from 0.9 to 1.48 putting her in stage 3 renal failure with a GFR of 40.   Pt reports that she had12 holes, and is still wearing the binder. She reports that she returns 06/13/2014. Pt reports that she still is having some pain in the LLQ and bilateral upper quadrant.   Weight Loss: Pt reports that she has lost 15 lbs during recovery, and has gained a bit back since she has been  back at work.   Bladder: Pt reports that she is still having some pressure and urgency.     Past Medical History  Diagnosis Date  . Asthma   . Dysphagia   . Fibromyalgia   . Migraine   . GERD (gastroesophageal reflux disease)   . ADD (attention deficit disorder)   . Hernia of abdominal wall   . Allergy     ENVIRONMENTAL  . Hypertension   . Thyroid disease   . Fibromyalgia   . Numbness and tingling in hands   . Arthritis     knees  . IBS (irritable bowel syndrome)   . Difficulty sleeping   . Depression   . Swelling of both ankles    Current Outpatient Prescriptions on File Prior to Visit  Medication Sig Dispense Refill  . albuterol  (PROVENTIL HFA;VENTOLIN HFA) 108 (90 BASE) MCG/ACT inhaler Inhale 2 puffs into the lungs every 6 (six) hours as needed for wheezing or shortness of breath.      . B Complex-C-Folic Acid (B-COMPLEX BALANCED PO) Take 1 tablet by mouth daily.       . calcium carbonate (OS-CAL) 600 MG TABS Take 600 mg by mouth daily.      . cetirizine (ZYRTEC) 10 MG tablet Take 10 mg by mouth daily as needed for allergies.       . cholecalciferol (VITAMIN D) 400 UNITS TABS Take 400 Units by mouth daily.      Marland Kitchen esomeprazole (NEXIUM) 40 MG capsule Take 1 capsule (40 mg total) by mouth daily.  90 capsule  3  . Fluticasone-Salmeterol (ADVAIR) 100-50 MCG/DOSE AEPB Inhale 1 puff into the lungs 2 (two) times daily.  60 each  11  . ibuprofen (ADVIL,MOTRIN) 200 MG tablet Take 400-600 mg by mouth every 8 (eight) hours as needed for moderate pain.       . mometasone (NASONEX) 50 MCG/ACT nasal spray Place 2 sprays into the nose daily.  17 g  11  . Multiple Vitamin (MULTIVITAMIN) tablet Take 1 tablet by mouth daily.      Marland Kitchen  Polyethyl Glycol-Propyl Glycol (SYSTANE) 0.4-0.3 % SOLN Apply 1 drop to eye 3 (three) times daily as needed (dry eyes).      . pregabalin (LYRICA) 150 MG capsule Take 1 capsule (150 mg total) by mouth 3 (three) times daily.  270 capsule  1  . ranitidine (ZANTAC) 150 MG tablet Take 150 mg by mouth 2 (two) times daily as needed for heartburn.      . Red Yeast Rice 600 MG CAPS Take 600 mg by mouth daily.      . SUMAtriptan (IMITREX) 100 MG tablet Take 1 tablet (100 mg total) by mouth every 2 (two) hours as needed for migraine.  4 tablet  11  . SYNTHROID 50 MCG tablet Take 1 tablet (50 mcg total) by mouth daily before breakfast.  90 tablet  1  . traMADol (ULTRAM) 50 MG tablet Take 1 tablet (50 mg total) by mouth 4 (four) times daily.  120 tablet  2  . vitamin C (ASCORBIC ACID) 500 MG tablet Take 1,000 mg by mouth daily.      . [DISCONTINUED] hyoscyamine (CYSTOSPAZ) 0.15 MG tablet Take 0.15 mg by mouth every 4 (four)  hours as needed.       No current facility-administered medications on file prior to visit.   Allergies  Allergen Reactions  . Codeine Nausea Only    Can tolerate hydromet and tussionex  . Soap     Any soaps with fragrance cause migraines  . Sulfa Antibiotics Nausea And Vomiting    Review of Systems  Constitutional: Negative for fever, chills, activity change, appetite change and unexpected weight change.  Gastrointestinal: Positive for abdominal pain (LLQ and bilateral upper quadrant). Negative for nausea, vomiting, diarrhea, constipation and abdominal distention.  Genitourinary: Positive for urgency and frequency. Negative for dysuria and difficulty urinating.  Neurological: Positive for weakness.  Psychiatric/Behavioral: Positive for dysphoric mood.       Objective:   Physical Exam  Nursing note and vitals reviewed. Constitutional: She is oriented to person, place, and time. She appears well-developed and well-nourished.  HENT:  Head: Normocephalic and atraumatic.  Eyes: EOM are normal.  Neck: Normal range of motion. Neck supple.  Cardiovascular: Normal rate.   Pulmonary/Chest: Effort normal.  Musculoskeletal: Normal range of motion.  Neurological: She is alert and oriented to person, place, and time.  Skin: Skin is warm and dry.  Psychiatric: She has a normal mood and affect. Her behavior is normal.   BP 137/68  Pulse 98  Temp(Src) 97.8 F (36.6 C)  Resp 16  Ht  (1.6 m)  Wt 285 lb (129.275 kg)  BMI 50.50 kg/m2  SpO2 93%        Assessment & Plan:   Urinary frequency - Plan: POCT urinalysis dipstick, Urine culture - UA looks benign, pt reassured but will clx due to sxs - ADDENDUM: E.coli UTI - called in cipro Unspecified hypothyroidism - Plan: TSH  Prediabetes - Plan: POCT glycosylated hemoglobin (Hb A1C) - much improved from 6.1 ->5.8  Obesity, morbid, BMI 50 or higher  Essential hypertension  Fibromyalgia - Plan: carisoprodol (SOMA) 350 MG  tablet  Encounter for long-term (current) use of other medications - Plan: Comprehensive metabolic panel - Meds refilled, no changes.  Had ARF while in hosp - recheck cr.  Chronic pain - Plan: carisoprodol (SOMA) 350 MG tablet  ADD (attention deficit disorder)  Anemia, unspecified anemia type - Plan: POCT CBC  Meds ordered this encounter  Medications  . amphetamine-dextroamphetamine (ADDERALL) 7.5 MG  tablet    Sig: Take 1 tablet (7.5 mg total) by mouth daily. May fill on or after 06/30/14    Dispense:  30 tablet    Refill:  0  . amphetamine-dextroamphetamine (ADDERALL) 7.5 MG tablet    Sig: Take 1 tablet (7.5 mg total) by mouth daily. May fill on or after 05/31/14    Dispense:  30 tablet    Refill:  0  . amphetamine-dextroamphetamine (ADDERALL) 7.5 MG tablet    Sig: Take 1 tablet (7.5 mg total) by mouth daily. May fill on or after 07/29/14    Dispense:  30 tablet    Refill:  0  . carisoprodol (SOMA) 350 MG tablet    Sig: Take 1 tablet (350 mg total) by mouth 4 (four) times daily as needed for muscle spasms.    Dispense:  120 tablet    Refill:  2  . citalopram (CELEXA) 10 MG tablet    Sig: Take 1 tablet (10 mg total) by mouth every evening.    Dispense:  90 tablet    Refill:  3  . montelukast (SINGULAIR) 10 MG tablet    Sig: Take 1 tablet (10 mg total) by mouth every morning.    Dispense:  90 tablet    Refill:  3  . telmisartan-hydrochlorothiazide (MICARDIS HCT) 80-12.5 MG per tablet    Sig: Take 1 tablet by mouth every evening.    Dispense:  90 tablet    Refill:  3    I personally performed the services described in this documentation, which was scribed in my presence. The recorded information has been reviewed and considered, and addended by me as needed.  Norberto Sorenson, MD MPH   Results for orders placed in visit on 05/31/14  URINE CULTURE      Result Value Ref Range   Colony Count >=100,000 COLONIES/ML     Preliminary Report ESCHERICHIA COLI    COMPREHENSIVE METABOLIC  PANEL      Result Value Ref Range   Sodium 138  135 - 145 mEq/L   Potassium 3.4 (*) 3.5 - 5.3 mEq/L   Chloride 99  96 - 112 mEq/L   CO2 30  19 - 32 mEq/L   Glucose, Bld 102 (*) 70 - 99 mg/dL   BUN 13  6 - 23 mg/dL   Creat 4.09  8.11 - 9.14 mg/dL   Total Bilirubin 0.3  0.2 - 1.2 mg/dL   Alkaline Phosphatase 103  39 - 117 U/L   AST 21  0 - 37 U/L   ALT 20  0 - 35 U/L   Total Protein 7.2  6.0 - 8.3 g/dL   Albumin 4.1  3.5 - 5.2 g/dL   Calcium 9.2  8.4 - 78.2 mg/dL  TSH      Result Value Ref Range   TSH 2.189  0.350 - 4.500 uIU/mL  POCT URINALYSIS DIPSTICK      Result Value Ref Range   Color, UA yellow     Clarity, UA clear     Glucose, UA neg     Bilirubin, UA neg     Ketones, UA neg     Spec Grav, UA 1.010     Blood, UA small     pH, UA 5.5     Protein, UA neg     Urobilinogen, UA 0.2     Nitrite, UA neg     Leukocytes, UA Trace    POCT CBC      Result Value  Ref Range   WBC 10.2  4.6 - 10.2 K/uL   Lymph, poc 3.6 (*) 0.6 - 3.4   POC LYMPH PERCENT 34.9  10 - 50 %L   MID (cbc) 0.8  0 - 0.9   POC MID % 8.3  0 - 12 %M   POC Granulocyte 5.8  2 - 6.9   Granulocyte percent 56.8  37 - 80 %G   RBC 4.44  4.04 - 5.48 M/uL   Hemoglobin 11.9 (*) 12.2 - 16.2 g/dL   HCT, POC 16.1  09.6 - 47.9 %   MCV 85.1  80 - 97 fL   MCH, POC 26.7 (*) 27 - 31.2 pg   MCHC 31.4 (*) 31.8 - 35.4 g/dL   RDW, POC 04.5     Platelet Count, POC 217  142 - 424 K/uL   MPV 9.5  0 - 99.8 fL  POCT GLYCOSYLATED HEMOGLOBIN (HGB A1C)      Result Value Ref Range   Hemoglobin A1C 5.8

## 2014-06-03 LAB — URINE CULTURE: Colony Count: >=100000

## 2014-07-02 ENCOUNTER — Encounter: Payer: Self-pay | Admitting: Family Medicine

## 2014-07-03 ENCOUNTER — Encounter: Payer: Self-pay | Admitting: *Deleted

## 2014-08-23 ENCOUNTER — Encounter: Payer: Self-pay | Admitting: Family Medicine

## 2014-08-23 ENCOUNTER — Ambulatory Visit (INDEPENDENT_AMBULATORY_CARE_PROVIDER_SITE_OTHER): Payer: 59 | Admitting: Family Medicine

## 2014-08-23 VITALS — BP 122/79 | HR 89 | Temp 98.4°F | Resp 16 | Ht 63.0 in | Wt 291.0 lb

## 2014-08-23 DIAGNOSIS — H1013 Acute atopic conjunctivitis, bilateral: Secondary | ICD-10-CM

## 2014-08-23 DIAGNOSIS — J45909 Unspecified asthma, uncomplicated: Secondary | ICD-10-CM

## 2014-08-23 DIAGNOSIS — K219 Gastro-esophageal reflux disease without esophagitis: Secondary | ICD-10-CM

## 2014-08-23 DIAGNOSIS — E876 Hypokalemia: Secondary | ICD-10-CM

## 2014-08-23 DIAGNOSIS — R1032 Left lower quadrant pain: Secondary | ICD-10-CM

## 2014-08-23 DIAGNOSIS — G8929 Other chronic pain: Secondary | ICD-10-CM

## 2014-08-23 DIAGNOSIS — M797 Fibromyalgia: Secondary | ICD-10-CM

## 2014-08-23 LAB — POCT URINALYSIS DIPSTICK
Bilirubin, UA: NEGATIVE
GLUCOSE UA: NEGATIVE
Ketones, UA: NEGATIVE
Leukocytes, UA: NEGATIVE
Nitrite, UA: NEGATIVE
PROTEIN UA: NEGATIVE
Spec Grav, UA: <=1.005
UROBILINOGEN UA: 0.2
pH, UA: 5.5

## 2014-08-23 LAB — COMPLETE METABOLIC PANEL WITH GFR
ALK PHOS: 119 U/L — AB (ref 39–117)
ALT: 19 U/L (ref 0–35)
AST: 20 U/L (ref 0–37)
Albumin: 4 g/dL (ref 3.5–5.2)
BILIRUBIN TOTAL: 0.3 mg/dL (ref 0.2–1.2)
BUN: 14 mg/dL (ref 6–23)
CO2: 26 mEq/L (ref 19–32)
Calcium: 9.3 mg/dL (ref 8.4–10.5)
Chloride: 99 mEq/L (ref 96–112)
Creat: 0.7 mg/dL (ref 0.50–1.10)
GFR, Est African American: >89 mL/min
GFR, Est Non African American: >89 mL/min
Glucose, Bld: 92 mg/dL (ref 70–99)
POTASSIUM: 3.7 meq/L (ref 3.5–5.3)
Sodium: 140 mEq/L (ref 135–145)
Total Protein: 7.4 g/dL (ref 6.0–8.3)

## 2014-08-23 LAB — CBC WITH DIFFERENTIAL/PLATELET
BASOS ABS: 0 10*3/uL (ref 0.0–0.1)
Basophils Relative: 0 % (ref 0–1)
EOS PCT: 2 % (ref 0–5)
Eosinophils Absolute: 0.2 10*3/uL (ref 0.0–0.7)
HEMATOCRIT: 36.4 % (ref 36.0–46.0)
HEMOGLOBIN: 12.1 g/dL (ref 12.0–15.0)
LYMPHS ABS: 3.4 10*3/uL (ref 0.7–4.0)
LYMPHS PCT: 32 % (ref 12–46)
MCH: 28 pg (ref 26.0–34.0)
MCHC: 33.2 g/dL (ref 30.0–36.0)
MCV: 84.3 fL (ref 78.0–100.0)
MONO ABS: 0.8 10*3/uL (ref 0.1–1.0)
MONOS PCT: 8 % (ref 3–12)
NEUTROS ABS: 6.1 10*3/uL (ref 1.7–7.7)
Neutrophils Relative %: 58 % (ref 43–77)
Platelets: 221 10*3/uL (ref 150–400)
RBC: 4.32 MIL/uL (ref 3.87–5.11)
RDW: 14.9 % (ref 11.5–15.5)
WBC: 10.6 10*3/uL — AB (ref 4.0–10.5)

## 2014-08-23 LAB — POCT UA - MICROSCOPIC ONLY
CASTS, UR, LPF, POC: NEGATIVE
Crystals, Ur, HPF, POC: NEGATIVE
Mucus, UA: NEGATIVE
WBC, Ur, HPF, POC: NEGATIVE
YEAST UA: NEGATIVE

## 2014-08-23 MED ORDER — AMPHETAMINE-DEXTROAMPHETAMINE 7.5 MG PO TABS
7.5000 mg | ORAL_TABLET | Freq: Every day | ORAL | Status: DC
Start: 2014-08-23 — End: 2014-11-29

## 2014-08-23 MED ORDER — AMPHETAMINE-DEXTROAMPHETAMINE 7.5 MG PO TABS: 7.5000 mg | ORAL_TABLET | Freq: Every day | ORAL | Status: DC

## 2014-08-23 MED ORDER — FLUTICASONE-SALMETEROL 100-50 MCG/DOSE IN AEPB: 1.0000 | INHALATION_SPRAY | Freq: Two times a day (BID) | RESPIRATORY_TRACT | Status: DC

## 2014-08-23 MED ORDER — PREGABALIN 150 MG PO CAPS
150.0000 mg | ORAL_CAPSULE | Freq: Three times a day (TID) | ORAL | Status: DC
Start: 2014-08-23 — End: 2015-02-28

## 2014-08-23 MED ORDER — TRAMADOL HCL 50 MG PO TABS: 50.0000 mg | ORAL_TABLET | Freq: Four times a day (QID) | ORAL | Status: DC

## 2014-08-23 MED ORDER — ESOMEPRAZOLE MAGNESIUM 40 MG PO CPDR: 40.0000 mg | DELAYED_RELEASE_CAPSULE | Freq: Every day | ORAL | Status: DC

## 2014-08-23 MED ORDER — AZELASTINE HCL 0.05 % OP SOLN: 2.0000 [drp] | Freq: Two times a day (BID) | OPHTHALMIC | Status: DC

## 2014-08-23 MED ORDER — MOMETASONE FUROATE 50 MCG/ACT NA SUSP: 2.0000 | Freq: Every day | NASAL | Status: DC

## 2014-08-23 MED ORDER — CARISOPRODOL 350 MG PO TABS: 350.0000 mg | ORAL_TABLET | Freq: Four times a day (QID) | ORAL | Status: DC | PRN

## 2014-08-23 NOTE — Patient Instructions (Signed)
Allergic Conjunctivitis  The conjunctiva is a thin membrane that covers the visible white part of the eyeball and the underside of the eyelids. This membrane protects and lubricates the eye. The membrane has small blood vessels running through it that can normally be seen. When the conjunctiva becomes inflamed, the condition is called conjunctivitis. In response to the inflammation, the conjunctival blood vessels become swollen. The swelling results in redness in the normally white part of the eye.  The blood vessels of this membrane also react when a person has allergies and is then called allergic conjunctivitis. This condition usually lasts for as long as the allergy persists. Allergic conjunctivitis cannot be passed to another person (non-contagious). The likelihood of bacterial infection is great and the cause is not likely due to allergies if the inflamed eye has:  · A sticky discharge.  · Discharge or sticking together of the lids in the morning.  · Scaling or flaking of the eyelids where the eyelashes come out.  · Red swollen eyelids.  CAUSES   · Viruses.  · Irritants such as foreign bodies.  · Chemicals.  · General allergic reactions.  · Inflammation or serious diseases in the inside or the outside of the eye or the orbit (the boney cavity in which the eye sits) can cause a "red eye."  SYMPTOMS   · Eye redness.  · Tearing.  · Itchy eyes.  · Burning feeling in the eyes.  · Clear drainage from the eye.  · Allergic reaction due to pollens or ragweed sensitivity. Seasonal allergic conjunctivitis is frequent in the spring when pollens are in the air and in the fall.  DIAGNOSIS   This condition, in its many forms, is usually diagnosed based on the history and an ophthalmological exam. It usually involves both eyes. If your eyes react at the same time every year, allergies may be the cause. While most "red eyes" are due to allergy or an infection, the role of an eye (ophthalmological) exam is important. The exam  can rule out serious diseases of the eye or orbit.  TREATMENT   · Non-antibiotic eye drops, ointments, or medications by mouth may be prescribed if the ophthalmologist is sure the conjunctivitis is due to allergies alone.  · Over-the-counter drops and ointments for allergic symptoms should be used only after other causes of conjunctivitis have been ruled out, or as your caregiver suggests.  Medications by mouth are often prescribed if other allergy-related symptoms are present. If the ophthalmologist is sure that the conjunctivitis is due to allergies alone, treatment is normally limited to drops or ointments to reduce itching and burning.  HOME CARE INSTRUCTIONS   · Wash hands before and after applying drops or ointments, or touching the inflamed eye(s) or eyelids.  · Do not let the eye dropper tip or ointment tube touch the eyelid when putting medicine in your eye.  · Stop using your soft contact lenses and throw them away. Use a new pair of lenses when recovery is complete. You should run through sterilizing cycles at least three times before use after complete recovery if the old soft contact lenses are to be used. Hard contact lenses should be stopped. They need to be thoroughly sterilized before use after recovery.  · Itching and burning eyes due to allergies is often relieved by using a cool cloth applied to closed eye(s).  SEEK MEDICAL CARE IF:   · Your problems do not go away after two or three days of treatment.  ·   Your lids are sticky (especially in the morning when you wake up) or stick together.  · Discharge develops. Antibiotics may be needed either as drops, ointment, or by mouth.  · You have extreme light sensitivity.  · An oral temperature above 102° F (38.9° C) develops.  · Pain in or around the eye or any other visual symptom develops.  MAKE SURE YOU:   · Understand these instructions.  · Will watch your condition.  · Will get help right away if you are not doing well or get worse.  Document  Released: 12/18/2002 Document Revised: 12/20/2011 Document Reviewed: 11/13/2007  [employer]® Patient Information ©2015 [employer]. This information is not intended to replace advice given to you by your health care provider. Make sure you discuss any questions you have with your health care provider.

## 2014-08-23 NOTE — Progress Notes (Signed)
Subjective:    Patient ID: Gloria Carter, female    DOB: 06-23-1966, 48 y.o.   MRN: [id number]   This chart was scribed for Shawnee Knapp, MD by Hilda Lias, ED Scribe. This patient was seen in room Room/bed info not found and the patient's care was started at 4:37 PM.  Chief Complaint  Patient presents with  . Medication Refill  . Eye Drainage  . Abdominal Pain    since June 18th    HPI Past Medical History  Diagnosis Date  . Asthma   . Dysphagia   . Fibromyalgia   . Migraine   . GERD (gastroesophageal reflux disease)   . ADD (attention deficit disorder)   . Hernia of abdominal wall   . Allergy     ENVIRONMENTAL  . Hypertension   . Thyroid disease   . Fibromyalgia   . Numbness and tingling in hands   . Arthritis     knees  . IBS (irritable bowel syndrome)   . Difficulty sleeping   . Depression   . Swelling of both ankles    Current Outpatient Prescriptions on File Prior to Visit  Medication Sig Dispense Refill  . albuterol (PROVENTIL HFA;VENTOLIN HFA) 108 (90 BASE) MCG/ACT inhaler Inhale 2 puffs into the lungs every 6 (six) hours as needed for wheezing or shortness of breath.    . amphetamine-dextroamphetamine (ADDERALL) 7.5 MG tablet Take 1 tablet (7.5 mg total) by mouth daily. May fill on or after 06/30/14 30 tablet 0  . amphetamine-dextroamphetamine (ADDERALL) 7.5 MG tablet Take 1 tablet (7.5 mg total) by mouth daily. May fill on or after 05/31/14 30 tablet 0  . amphetamine-dextroamphetamine (ADDERALL) 7.5 MG tablet Take 1 tablet (7.5 mg total) by mouth daily. May fill on or after 07/29/14 30 tablet 0  . B Complex-C-Folic Acid (B-COMPLEX BALANCED PO) Take 1 tablet by mouth daily.     . calcium carbonate (OS-CAL) 600 MG TABS Take 600 mg by mouth daily.    . carisoprodol (SOMA) 350 MG tablet Take 1 tablet (350 mg total) by mouth 4 (four) times daily as needed for muscle spasms. 120 tablet 2  . cetirizine (ZYRTEC) 10 MG tablet Take 10 mg by mouth daily as needed  for allergies.     . cholecalciferol (VITAMIN D) 400 UNITS TABS Take 400 Units by mouth daily.    . ciprofloxacin (CIPRO) 500 MG tablet Take 1 tablet (500 mg total) by mouth 2 (two) times daily. 6 tablet 0  . citalopram (CELEXA) 10 MG tablet Take 1 tablet (10 mg total) by mouth every evening. 90 tablet 3  . esomeprazole (NEXIUM) 40 MG capsule Take 1 capsule (40 mg total) by mouth daily. 90 capsule 3  . Fluticasone-Salmeterol (ADVAIR) 100-50 MCG/DOSE AEPB Inhale 1 puff into the lungs 2 (two) times daily. 60 each 11  . ibuprofen (ADVIL,MOTRIN) 200 MG tablet Take 400-600 mg by mouth every 8 (eight) hours as needed for moderate pain.     . mometasone (NASONEX) 50 MCG/ACT nasal spray Place 2 sprays into the nose daily. 17 g 11  . montelukast (SINGULAIR) 10 MG tablet Take 1 tablet (10 mg total) by mouth every morning. 90 tablet 3  . Multiple Vitamin (MULTIVITAMIN) tablet Take 1 tablet by mouth daily.    Vladimir Faster Glycol-Propyl Glycol (SYSTANE) 0.4-0.3 % SOLN Apply 1 drop to eye 3 (three) times daily as needed (dry eyes).    . pregabalin (LYRICA) 150 MG capsule Take 1 capsule (150 mg total)  by mouth 3 (three) times daily. 270 capsule 1  . ranitidine (ZANTAC) 150 MG tablet Take 150 mg by mouth 2 (two) times daily as needed for heartburn.    . Red Yeast Rice 600 MG CAPS Take 600 mg by mouth daily.    . SUMAtriptan (IMITREX) 100 MG tablet Take 1 tablet (100 mg total) by mouth every 2 (two) hours as needed for migraine. 4 tablet 11  . SYNTHROID 50 MCG tablet Take 1 tablet (50 mcg total) by mouth daily before breakfast. 90 tablet 1  . telmisartan-hydrochlorothiazide (MICARDIS HCT) 80-12.5 MG per tablet Take 1 tablet by mouth every evening. 90 tablet 3  . traMADol (ULTRAM) 50 MG tablet Take 1 tablet (50 mg total) by mouth 4 (four) times daily. 120 tablet 2  . vitamin C (ASCORBIC ACID) 500 MG tablet Take 1,000 mg by mouth daily.    . [DISCONTINUED] hyoscyamine (CYSTOSPAZ) 0.15 MG tablet Take 0.15 mg by mouth  every 4 (four) hours as needed.     No current facility-administered medications on file prior to visit.   Allergies  Allergen Reactions  . Codeine Nausea Only    Can tolerate hydromet and tussionex  . Soap     Any soaps with fragrance cause migraines  . Sulfa Antibiotics Nausea And Vomiting    HPI Comments: Gloria Carter is a 48 y.o. female who presents to [facility] for medication refills. Pt also notes that she was having problems with constant excessive tearing and discharge from her right eye and occasionally her left eye as well with associated itching. Pt notes that she is allergic to cats and and has two cats at home that sleep with her. Pt has been using OTC eye drops with little relief.   Pt notes having intermittent pain in her LLQ. Pt notes having an episode 5-6 days ago when she was laughing. Pt states that the onset of pain occurred suddenly and was sharp. Pt notes that the pain made her bend over because it was so severe. Pt is five months post abdominal surgery, which could be contributing to her pain. Pt denies any urinary or digestive problems.       Review of Systems  Eyes: Positive for discharge.  Gastrointestinal: Positive for abdominal pain.       Objective:  BP 122/79 mmHg  Pulse 89  Temp(Src) 98.4 F (36.9 C)  Resp 16  Ht '5\' 3"'  (1.6 m)  Wt 291 lb (131.997 kg)  BMI 51.56 kg/m2  SpO2 95%  Physical Exam  Constitutional: She is oriented to person, place, and time. She appears well-developed and well-nourished.  HENT:  Head: Normocephalic and atraumatic.  Cardiovascular: Regular rhythm.  Tachycardia present.   Pulmonary/Chest: Effort normal.  Abdominal: She exhibits no distension. There is tenderness in the suprapubic area, left upper quadrant and left lower quadrant. There is no rebound and no guarding.  Musculoskeletal:  Mild CVA tenderness  Neurological: She is alert and oriented to person, place, and time.  Skin: Skin is  warm and dry.  Psychiatric: She has a normal mood and affect.  Nursing note and vitals reviewed.         Assessment & Plan:  .  Fibromyalgia - Plan: carisoprodol (SOMA) 350 MG tablet, pregabalin (LYRICA) 150 MG capsule, traMADol (ULTRAM) 50 MG tablet, COMPLETE METABOLIC PANEL WITH GFR, CBC with Differential  Chronic pain - Plan: carisoprodol (SOMA) 350 MG tablet  Gastroesophageal reflux disease, esophagitis presence not specified - Plan: esomeprazole ([place])  40 MG capsule, CBC with Differential  Asthma, unspecified asthma severity, uncomplicated - Plan: Fluticasone-Salmeterol (ADVAIR) 100-50 MCG/DOSE AEPB  Allergic conjunctivitis, bilateral - stop visine - try op antihistamine gtts.  If sxs worsen, call office and I will send abx op gtts to pharm.  Abdominal pain, left lower quadrant - Plan: CT Abdomen Pelvis W Wo Contrast, POCT UA - Microscopic Only, POCT urinalysis dipstick, COMPLETE METABOLIC PANEL WITH GFR, CBC with Differential, Urine culture - still w/ persistent LLQ pain 5 months after surgery - area of LLQ tenderness does not correspond to surgical site or scar but could be due to intraabd scar tissue from hernia surg - check CT  Hypokalemia - Plan: COMPLETE METABOLIC PANEL WITH GFR, CBC with Differential  Meds ordered this encounter  Medications  . amphetamine-dextroamphetamine (ADDERALL) 7.5 MG tablet    Sig: Take 1 tablet by mouth daily. May fill on or after 07/29/14    Dispense:  30 tablet    Refill:  0  . amphetamine-dextroamphetamine (ADDERALL) 7.5 MG tablet    Sig: Take 1 tablet by mouth daily. May fill on or after 06/30/14    Dispense:  30 tablet    Refill:  0  . amphetamine-dextroamphetamine (ADDERALL) 7.5 MG tablet    Sig: Take 1 tablet by mouth daily. May fill on or after 05/31/14    Dispense:  30 tablet    Refill:  0  . carisoprodol (SOMA) 350 MG tablet    Sig: Take 1 tablet (350 mg total) by mouth 4 (four) times daily as needed for muscle spasms.     Dispense:  120 tablet    Refill:  2  . esomeprazole (NEXIUM) 40 MG capsule    Sig: Take 1 capsule (40 mg total) by mouth daily.    Dispense:  90 capsule    Refill:  3  . Fluticasone-Salmeterol (ADVAIR) 100-50 MCG/DOSE AEPB    Sig: Inhale 1 puff into the lungs 2 (two) times daily.    Dispense:  60 each    Refill:  11  . mometasone (NASONEX) 50 MCG/ACT nasal spray    Sig: Place 2 sprays into the nose daily.    Dispense:  17 g    Refill:  11  . pregabalin (LYRICA) 150 MG capsule    Sig: Take 1 capsule (150 mg total) by mouth 3 (three) times daily.    Dispense:  270 capsule    Refill:  1  . traMADol (ULTRAM) 50 MG tablet    Sig: Take 1 tablet (50 mg total) by mouth 4 (four) times daily.    Dispense:  120 tablet    Refill:  2  . azelastine (OPTIVAR) 0.05 % ophthalmic solution    Sig: Place 2 drops into both eyes 2 (two) times daily.    Dispense:  6 mL    Refill:  2    I personally performed the services described in this documentation, which was scribed in my presence. The recorded information has been reviewed and considered, and addended by me as needed.  Delman Cheadle, MD MPH   Results for orders placed or performed in visit on 08/23/14  Urine culture  Result Value Ref Range   Colony Count NO GROWTH    Organism ID, Bacteria NO GROWTH   COMPLETE METABOLIC PANEL WITH GFR  Result Value Ref Range   Sodium 140 135 - 145 mEq/L   Potassium 3.7 3.5 - 5.3 mEq/L   Chloride 99 96 - 112 mEq/L   CO2  26 19 - 32 mEq/L   Glucose, Bld 92 70 - 99 mg/dL   BUN 14 6 - 23 mg/dL   Creat 0.70 0.50 - 1.10 mg/dL   Total Bilirubin 0.3 0.2 - 1.2 mg/dL   Alkaline Phosphatase 119 (H) 39 - 117 U/L   AST 20 0 - 37 U/L   ALT 19 0 - 35 U/L   Total Protein 7.4 6.0 - 8.3 g/dL   Albumin 4.0 3.5 - 5.2 g/dL   Calcium 9.3 8.4 - 10.5 mg/dL   GFR, Est African American >89 mL/min   GFR, Est Non African American >89 mL/min  CBC with Differential  Result Value Ref Range   WBC 10.6 (H) 4.0 - 10.5 K/uL   RBC  4.32 3.87 - 5.11 MIL/uL   Hemoglobin 12.1 12.0 - 15.0 g/dL   HCT 36.4 36.0 - 46.0 %   MCV 84.3 78.0 - 100.0 fL   MCH 28.0 26.0 - 34.0 pg   MCHC 33.2 30.0 - 36.0 g/dL   RDW 14.9 11.5 - 15.5 %   Platelets 221 150 - 400 K/uL   Neutrophils Relative % 58 43 - 77 %   Neutro Abs 6.1 1.7 - 7.7 K/uL   Lymphocytes Relative 32 12 - 46 %   Lymphs Abs 3.4 0.7 - 4.0 K/uL   Monocytes Relative 8 3 - 12 %   Monocytes Absolute 0.8 0.1 - 1.0 K/uL   Eosinophils Relative 2 0 - 5 %   Eosinophils Absolute 0.2 0.0 - 0.7 K/uL   Basophils Relative 0 0 - 1 %   Basophils Absolute 0.0 0.0 - 0.1 K/uL   Smear Review Criteria for review not met   POCT UA - Microscopic Only  Result Value Ref Range   WBC, Ur, HPF, POC neg    RBC, urine, microscopic 0-1    Bacteria, U Microscopic trace    Mucus, UA neg    Epithelial cells, urine per micros 1-3    Crystals, Ur, HPF, POC neg    Casts, Ur, LPF, POC neg    Yeast, UA neg   POCT urinalysis dipstick  Result Value Ref Range   Color, UA yellow    Clarity, UA clear    Glucose, UA neg    Bilirubin, UA neg    Ketones, UA neg    Spec Grav, UA <=1.005    Blood, UA small    pH, UA 5.5    Protein, UA neg    Urobilinogen, UA 0.2    Nitrite, UA neg    Leukocytes, UA Negative

## 2014-08-25 LAB — URINE CULTURE
Colony Count: NO GROWTH
ORGANISM ID, BACTERIA: NO GROWTH

## 2014-09-19 ENCOUNTER — Other Ambulatory Visit: Payer: Self-pay | Admitting: *Deleted

## 2014-09-19 DIAGNOSIS — R1032 Left lower quadrant pain: Secondary | ICD-10-CM

## 2014-09-20 ENCOUNTER — Ambulatory Visit
Admission: RE | Admit: 2014-09-20 | Discharge: 2014-09-20 | Disposition: A | Discharge status: Home / Self Care | Payer: 59 | Source: Ambulatory Visit | Attending: Family Medicine | Admitting: Family Medicine

## 2014-09-20 DIAGNOSIS — R1032 Left lower quadrant pain: Secondary | ICD-10-CM

## 2014-09-20 MED ORDER — IOHEXOL 300 MG/ML  SOLN
125.0000 mL | Freq: Once | INTRAMUSCULAR | Status: AC | PRN
  Administered 2014-09-20: 125 mL via INTRAVENOUS

## 2014-10-08 ENCOUNTER — Other Ambulatory Visit: Payer: Self-pay | Admitting: Family Medicine

## 2014-11-28 ENCOUNTER — Other Ambulatory Visit: Payer: Self-pay

## 2014-11-28 DIAGNOSIS — M797 Fibromyalgia: Secondary | ICD-10-CM

## 2014-11-28 NOTE — Telephone Encounter (Signed)
Pharm sent req for RF of tramadol.

## 2014-11-29 ENCOUNTER — Encounter: Payer: Self-pay | Admitting: Family Medicine

## 2014-11-29 ENCOUNTER — Ambulatory Visit (INDEPENDENT_AMBULATORY_CARE_PROVIDER_SITE_OTHER): Payer: 59 | Admitting: Family Medicine

## 2014-11-29 VITALS — BP 150/75 | HR 101 | Temp 98.2°F | Resp 16 | Ht 63.0 in | Wt 296.0 lb

## 2014-11-29 DIAGNOSIS — M797 Fibromyalgia: Secondary | ICD-10-CM

## 2014-11-29 DIAGNOSIS — I10 Essential (primary) hypertension: Secondary | ICD-10-CM

## 2014-11-29 DIAGNOSIS — J45909 Unspecified asthma, uncomplicated: Secondary | ICD-10-CM

## 2014-11-29 DIAGNOSIS — G8929 Other chronic pain: Secondary | ICD-10-CM

## 2014-11-29 DIAGNOSIS — F909 Attention-deficit hyperactivity disorder, unspecified type: Secondary | ICD-10-CM

## 2014-11-29 DIAGNOSIS — F988 Other specified behavioral and emotional disorders with onset usually occurring in childhood and adolescence: Secondary | ICD-10-CM

## 2014-11-29 MED ORDER — AMPHETAMINE-DEXTROAMPHETAMINE 7.5 MG PO TABS: 7.5000 mg | ORAL_TABLET | Freq: Two times a day (BID) | ORAL | Status: DC

## 2014-11-29 MED ORDER — CARISOPRODOL 350 MG PO TABS: 350.0000 mg | ORAL_TABLET | Freq: Four times a day (QID) | ORAL | Status: DC | PRN

## 2014-11-29 MED ORDER — TRAMADOL HCL 50 MG PO TABS: 50.0000 mg | ORAL_TABLET | Freq: Four times a day (QID) | ORAL | Status: DC

## 2014-11-29 NOTE — Patient Instructions (Signed)
We have refilled your soma and ultram today. We have refilled your adderall. Please continue taking the 7.5 mg morning dose, but also add in a 2nd 7.5 mg dose around noon. This should help you to get through the afternoon but not keep you up at night.  We'll plan to see you back in 3 months.  Please plan to get spirometry at your next appointment. If you feel like your advair isn't working please let us know. You can still go up on the adviar dose.  Please continue to check your home bp, let us know if it is staying above 140/90.

## 2014-11-29 NOTE — Progress Notes (Signed)
Subjective:    Patient ID: Gloria Carter, female    DOB: 03/17/1966, 49 y.o.   MRN: [id number]  Chief Complaint  Patient presents with  . Medication Refill  . parking placard   Patient Active Problem List   Diagnosis Date Noted  . Incarcerated ventral hernia 03/28/2014  . Obesity, morbid, BMI 50 or higher 02/15/2014  . Prediabetes 09/08/2013  . Unspecified hypothyroidism 08/31/2013  . Asthma, chronic 08/31/2013  . Upper airway cough syndrome 08/31/2013  . Carpal tunnel syndrome 06/01/2013  . Wrist tendonitis 06/01/2013  . Encounter for long-term (current) use of other medications 12/01/2012  . HTN (hypertension) 08/13/2012  . BMI 45.0-49.9, adult 08/13/2012  . Hyperlipidemia 08/13/2012  . IBS (irritable bowel syndrome) 08/13/2012  . ADD (attention deficit disorder) 02/07/2012  . GERD (gastroesophageal reflux disease) 02/07/2012  . Migraine 02/07/2012  . Dysphagia 02/07/2012  . Hernia of abdominal wall 02/07/2012  . RAD (reactive airway disease) 12/20/2011  . Fibromyalgia 12/20/2011  . Chronic pain 12/20/2011   Prior to Admission medications   Medication Sig Start Date End Date Taking? Authorizing Provider  albuterol (PROVENTIL HFA;VENTOLIN HFA) 108 (90 BASE) MCG/ACT inhaler Inhale 2 puffs into the lungs every 6 (six) hours as needed for wheezing or shortness of breath.   Yes Historical Provider, MD  amphetamine-dextroamphetamine (ADDERALL) 7.5 MG tablet Take 1 tablet by mouth 2 (two) times daily. 11/29/14  Yes Sherren Mocha, MD  amphetamine-dextroamphetamine (ADDERALL) 7.5 MG tablet Take 1 tablet by mouth 2 (two) times daily. May fill on or after 30 days from date written 11/29/14  Yes Sherren Mocha, MD  amphetamine-dextroamphetamine (ADDERALL) 7.5 MG tablet Take 1 tablet by mouth 2 (two) times daily. May fill 60 days from date written. 11/29/14  Yes Sherren Mocha, MD  azelastine (OPTIVAR) 0.05 % ophthalmic solution Place 2 drops into both eyes 2 (two) times daily. 08/23/14  Yes  Sherren Mocha, MD  B Complex-C-Folic Acid (B-COMPLEX BALANCED PO) Take 1 tablet by mouth daily.    Yes Historical Provider, MD  calcium carbonate (OS-CAL) 600 MG TABS Take 600 mg by mouth daily.   Yes Historical Provider, MD  carisoprodol (SOMA) 350 MG tablet Take 1 tablet (350 mg total) by mouth 4 (four) times daily as needed for muscle spasms. 11/29/14  Yes Sherren Mocha, MD  cetirizine (ZYRTEC) 10 MG tablet Take 10 mg by mouth daily as needed for allergies.    Yes Historical Provider, MD  cholecalciferol (VITAMIN D) 400 UNITS TABS Take 400 Units by mouth daily.   Yes Historical Provider, MD  citalopram (CELEXA) 10 MG tablet Take 1 tablet (10 mg total) by mouth every evening. 05/31/14  Yes Sherren Mocha, MD  esomeprazole (NEXIUM) 40 MG capsule Take 1 capsule (40 mg total) by mouth daily. 08/23/14  Yes Sherren Mocha, MD  Fluticasone-Salmeterol (ADVAIR) 100-50 MCG/DOSE AEPB Inhale 1 puff into the lungs 2 (two) times daily. 08/23/14  Yes Sherren Mocha, MD  ibuprofen (ADVIL,MOTRIN) 200 MG tablet Take 400-600 mg by mouth every 8 (eight) hours as needed for moderate pain.    Yes Historical Provider, MD  montelukast (SINGULAIR) 10 MG tablet Take 1 tablet (10 mg total) by mouth every morning. 05/31/14  Yes Sherren Mocha, MD  Multiple Vitamin (MULTIVITAMIN) tablet Take 1 tablet by mouth daily.   Yes Historical Provider, MD  Polyethyl Glycol-Propyl Glycol (SYSTANE) 0.4-0.3 % SOLN Apply 1 drop to eye 3 (three) times daily as needed (dry eyes).  Yes Historical Provider, MD  pregabalin (LYRICA) 150 MG capsule Take 1 capsule (150 mg total) by mouth 3 (three) times daily. 08/23/14  Yes Sherren Mocha, MD  PROVENTIL HFA 108 [id number] BASE) MCG/ACT inhaler INHALE TWO PUFFS BY MOUTH EVERY SIX HOURS AS NEEDED  10/08/14  Yes Sherren Mocha, MD  ranitidine (ZANTAC) 150 MG tablet Take 150 mg by mouth 2 (two) times daily as needed for heartburn.   Yes Historical Provider, MD  Red Yeast Rice 600 MG CAPS Take 600 mg by mouth daily.   Yes Historical  Provider, MD  SUMAtriptan (IMITREX) 100 MG tablet Take 1 tablet (100 mg total) by mouth every 2 (two) hours as needed for migraine. 02/15/14  Yes Sherren Mocha, MD  SYNTHROID 50 MCG tablet Take 1 tablet (50 mcg total) by mouth daily before breakfast. 06/02/14  Yes Sherren Mocha, MD  telmisartan-hydrochlorothiazide (MICARDIS HCT) 80-12.5 MG per tablet Take 1 tablet by mouth every evening. 05/31/14  Yes Sherren Mocha, MD  traMADol (ULTRAM) 50 MG tablet Take 1 tablet (50 mg total) by mouth 4 (four) times daily. 11/29/14  Yes Sherren Mocha, MD  vitamin C (ASCORBIC ACID) 500 MG tablet Take 1,000 mg by mouth daily.   Yes Historical Provider, MD     Medications, allergies, past medical history, surgical history, family history, social history and problem list reviewed and updated.  HPI  89 yof with extensive pmh as above presents for med refills.  She has hx chronic pain, fibromyalgia. Currently takes soma 325 mg qid, lyrica 150 tid, and ultram 50 mg qid. She feels that this regimen works well to control her pain. She is currently applying for a handicap parking placard.   Hx ADD - Has been on 7.5 mg adderall qd in the am. She feels this is working well in morning but is wearing off by afternoon. She used to be on adderall xr but had trouble sleeping so stopped. Denies current insomnia, decreased appetite, wt loss, elevated hr.   Other than needing refills she feels that everything is stable since last visit with [place] 11/15. At that time she was having llq pain, had normal ct abdomen. She states today the pains still come on occasionally but are improved.   Has hx asthma - uses advair bid. Uses her albuterol daily. States she has done this for many months. Uses the albuterol daily when she feels sob.   HTN - BP mildly elevated today. Pt states she is stressed as she recently learned she had to move her office across the hallway. States she takes her bp at home and typically runs 120/80s.   Review of Systems No cp,  sob, fever, chills, n/v, diarrhea. No vision changes.     Objective:   Physical Exam  Constitutional: She is oriented to person, place, and time. She appears well-developed and well-nourished.  Non-toxic appearance. She does not have a sickly appearance. She does not appear ill. No distress.  BP 150/75 mmHg  Pulse 101  Temp(Src) 98.2 F (36.8 C)  Resp 16  Ht  (1.6 m)  Wt 296 lb (134.265 kg)  BMI 52.45 kg/m2  SpO2 96%   Neurological: She is alert and oriented to person, place, and time.  Psychiatric: She has a normal mood and affect. Her speech is normal and behavior is normal.      Assessment & Plan:   80 yof with extensive pmh as above presents for med refills.  Fibromyalgia -  Plan: carisoprodol (SOMA) 350 MG tablet, traMADol (ULTRAM) 50 MG tablet Chronic pain - Plan: carisoprodol (SOMA) 350 MG tablet --stable --refilled soma, ultram today --3 mon f/u --handicap placard given  Asthma, unspecified asthma severity, uncomplicated --declines spirometry today, likely will need at next visit --using albuterol daily --pt could go up on advair dose, informed, she will contact office if she feels that she would like to increase dose, otherwise we will revisit at appt in 3 months  ADD (attention deficit disorder) --adderall 7.5 mg qd was effective but left her with sx in afternoon --increase to 7.5 mg adderall bid --> once in am then once around noon, this should get her through afternoon yet not cause insomnia that xr version caused  Essential hypertension --mildly elevated today, pt states 120/80s at home --continue current regimen  Donnajean Lopesodd M. Deb Loudin, PA-C Physician Assistant-Certified [facility]  11/29/2014 5:23 PM

## 2014-12-01 NOTE — Progress Notes (Signed)
Reviewed documentation and agree w/ assessment and plan. Gloria Shaw, MD MPH 

## 2014-12-13 ENCOUNTER — Ambulatory Visit: Payer: Self-pay | Admitting: Family Medicine

## 2015-01-23 NOTE — Telephone Encounter (Signed)
This was done.

## 2015-02-13 ENCOUNTER — Ambulatory Visit (INDEPENDENT_AMBULATORY_CARE_PROVIDER_SITE_OTHER): Payer: 59 | Admitting: Family Medicine

## 2015-02-13 ENCOUNTER — Ambulatory Visit (INDEPENDENT_AMBULATORY_CARE_PROVIDER_SITE_OTHER): Payer: 59

## 2015-02-13 VITALS — BP 130/86 | HR 92 | Temp 98.0°F | Resp 20 | Ht 63.0 in | Wt 297.5 lb

## 2015-02-13 DIAGNOSIS — M25551 Pain in right hip: Secondary | ICD-10-CM

## 2015-02-13 DIAGNOSIS — J4531 Mild persistent asthma with (acute) exacerbation: Secondary | ICD-10-CM

## 2015-02-13 DIAGNOSIS — J0191 Acute recurrent sinusitis, unspecified: Secondary | ICD-10-CM

## 2015-02-13 DIAGNOSIS — M5137 Other intervertebral disc degeneration, lumbosacral region: Secondary | ICD-10-CM | POA: Diagnosis not present

## 2015-02-13 DIAGNOSIS — I10 Essential (primary) hypertension: Secondary | ICD-10-CM | POA: Diagnosis not present

## 2015-02-13 DIAGNOSIS — J019 Acute sinusitis, unspecified: Secondary | ICD-10-CM

## 2015-02-13 MED ORDER — HYDROCODONE-ACETAMINOPHEN 5-325 MG PO TABS: 1.0000 | ORAL_TABLET | Freq: Four times a day (QID) | ORAL | Status: DC | PRN

## 2015-02-13 MED ORDER — PREDNISONE 20 MG PO TABS
ORAL_TABLET | ORAL | Status: DC
Start: 2015-02-13 — End: 2015-02-28

## 2015-02-13 MED ORDER — AZITHROMYCIN 250 MG PO TABS: ORAL_TABLET | ORAL | Status: DC

## 2015-02-13 MED ORDER — BENZONATATE 100 MG PO CAPS: 100.0000 mg | ORAL_CAPSULE | Freq: Three times a day (TID) | ORAL | Status: DC | PRN

## 2015-02-13 NOTE — Progress Notes (Addendum)
Subjective:  This chart was scribed for Norberto SorensonEva Shaw, MD by Charline BillsEssence Howell, ED Scribe. The patient was seen in room 10. Patient's care was started at 3:43 PM.   Patient ID: Gloria Carter, female    DOB: May 09, 1966, 49 y.o.   MRN: [id number]  Chief Complaint  Patient presents with  . Hip Pain    right hip pain---started on sunday night. having a hard time with walking  . Sinus Problem    sinus drainage x several months.  cough with green/yellow sputum   HPI  HPI Comments: Gloria Carter is a 49 y.o. female, with a h/o HTN, fibromyalgia, asthma, IBS, arthritis, GERD, ADD, depression, who presents to the [facility] complaining of gradually worsening, persistent R lower back pain for the past 4 days. She does not recall a specific incident that caused pain; she simply states that she "over did it". Pt states that pain radiates from her R lower back and into her R hip, down to her R thigh. Pain is exacerbated with sitting and internal rotation of the hip. Pt also reports a mild tingling sensation in her R foot a few days ago that is present only with sitting. She denies recent falls or weakness. Pt has been treating with previously prescribed Norco, her daily pain medications and muscle relaxants, and a heating pad with temporary relief. Pt has ambulated with a walker for the past few days due to pain. She reports similar pain in 2007 but states that this pain is more severe. MRI in 2007 showed bulging L4L5 and L5S1 discs. Pt had injections done by Dr. Penni BombardKendall in 2007 with much relief.   Cough Pt presents with lingering productive cough with yellow/green sputum for the past 2 months. She reports associated chills, sinus pressure, postnasal drip, wheezing, SOB. Pt denies fever and chest pain. She has been treating with Zyrtec, Singulair and her inhaler which she has had to use more often; x2 daily and occasionally x4 daily.   Past Medical History  Diagnosis Date  . Asthma    . Dysphagia   . Fibromyalgia   . Migraine   . GERD (gastroesophageal reflux disease)   . ADD (attention deficit disorder)   . Hernia of abdominal wall   . Allergy     ENVIRONMENTAL  . Hypertension   . Thyroid disease   . Fibromyalgia   . Numbness and tingling in hands   . Arthritis     knees  . IBS (irritable bowel syndrome)   . Difficulty sleeping   . Depression   . Swelling of both ankles    Current Outpatient Prescriptions on File Prior to Visit  Medication Sig Dispense Refill  . albuterol (PROVENTIL HFA;VENTOLIN HFA) 108 (90 BASE) MCG/ACT inhaler Inhale 2 puffs into the lungs every 6 (six) hours as needed for wheezing or shortness of breath.    . amphetamine-dextroamphetamine (ADDERALL) 7.5 MG tablet Take 1 tablet by mouth 2 (two) times daily. 60 tablet 0  . amphetamine-dextroamphetamine (ADDERALL) 7.5 MG tablet Take 1 tablet by mouth 2 (two) times daily. May fill on or after 30 days from date written 60 tablet 0  . amphetamine-dextroamphetamine (ADDERALL) 7.5 MG tablet Take 1 tablet by mouth 2 (two) times daily. May fill 60 days from date written. 60 tablet 0  . azelastine (OPTIVAR) 0.05 % ophthalmic solution Place 2 drops into both eyes 2 (two) times daily. 6 mL 2  . B Complex-C-Folic Acid (B-COMPLEX BALANCED PO) Take 1 tablet  by mouth daily.     . calcium carbonate (OS-CAL) 600 MG TABS Take 600 mg by mouth daily.    . carisoprodol (SOMA) 350 MG tablet Take 1 tablet (350 mg total) by mouth 4 (four) times daily as needed for muscle spasms. 120 tablet 2  . cetirizine (ZYRTEC) 10 MG tablet Take 10 mg by mouth daily as needed for allergies.     . cholecalciferol (VITAMIN D) 400 UNITS TABS Take 400 Units by mouth daily.    . citalopram (CELEXA) 10 MG tablet Take 1 tablet (10 mg total) by mouth every evening. 90 tablet 3  . esomeprazole (NEXIUM) 40 MG capsule Take 1 capsule (40 mg total) by mouth daily. 90 capsule 3  . Fluticasone-Salmeterol (ADVAIR) 100-50 MCG/DOSE AEPB Inhale 1  puff into the lungs 2 (two) times daily. 60 each 11  . ibuprofen (ADVIL,MOTRIN) 200 MG tablet Take 400-600 mg by mouth every 8 (eight) hours as needed for moderate pain.     . montelukast (SINGULAIR) 10 MG tablet Take 1 tablet (10 mg total) by mouth every morning. 90 tablet 3  . Multiple Vitamin (MULTIVITAMIN) tablet Take 1 tablet by mouth daily.    Bertram Gala Glycol-Propyl Glycol (SYSTANE) 0.4-0.3 % SOLN Apply 1 drop to eye 3 (three) times daily as needed (dry eyes).    . pregabalin (LYRICA) 150 MG capsule Take 1 capsule (150 mg total) by mouth 3 (three) times daily. 270 capsule 1  . PROVENTIL HFA 108 (90 BASE) MCG/ACT inhaler INHALE TWO PUFFS BY MOUTH EVERY SIX HOURS AS NEEDED  6.7 g 3  . ranitidine (ZANTAC) 150 MG tablet Take 150 mg by mouth 2 (two) times daily as needed for heartburn.    . Red Yeast Rice 600 MG CAPS Take 600 mg by mouth daily.    . SUMAtriptan (IMITREX) 100 MG tablet Take 1 tablet (100 mg total) by mouth every 2 (two) hours as needed for migraine. 4 tablet 11  . SYNTHROID 50 MCG tablet Take 1 tablet (50 mcg total) by mouth daily before breakfast. 90 tablet 1  . telmisartan-hydrochlorothiazide (MICARDIS HCT) 80-12.5 MG per tablet Take 1 tablet by mouth every evening. 90 tablet 3  . traMADol (ULTRAM) 50 MG tablet Take 1 tablet (50 mg total) by mouth 4 (four) times daily. 120 tablet 2  . vitamin C (ASCORBIC ACID) 500 MG tablet Take 1,000 mg by mouth daily.    . [DISCONTINUED] hyoscyamine (CYSTOSPAZ) 0.15 MG tablet Take 0.15 mg by mouth every 4 (four) hours as needed.     No current facility-administered medications on file prior to visit.   Allergies  Allergen Reactions  . Soap     Any soaps with fragrance cause migraines  . Sulfa Antibiotics Nausea And Vomiting   Review of Systems  Constitutional: Positive for activity change and unexpected weight change. Negative for fever, chills and appetite change.  HENT: Positive for postnasal drip and sinus pressure.     Respiratory: Positive for cough, shortness of breath and wheezing.   Cardiovascular: Negative for chest pain.  Musculoskeletal: Positive for myalgias, back pain, joint swelling, arthralgias, gait problem, neck pain and neck stiffness.  Skin: Positive for rash.  Neurological: Positive for numbness and headaches. Negative for weakness.  Psychiatric/Behavioral: Positive for sleep disturbance, dysphoric mood and decreased concentration. Negative for behavioral problems and confusion. The patient is nervous/anxious. The patient is not hyperactive.    BP 130/86 mmHg  Pulse 92  Temp(Src) 98 F (36.7 C) (Oral)  Resp 20  Ht  (1.6 m)  Wt 297 lb 8 oz (134.945 kg)  BMI 52.71 kg/m2  SpO2 96%    Objective:   Physical Exam  Constitutional: She is oriented to person, place, and time. She appears well-developed and well-nourished. No distress.  HENT:  Head: Normocephalic and atraumatic.  Eyes: Conjunctivae and EOM are normal.  Neck: Neck supple. No tracheal deviation present.  Cardiovascular: Normal rate.   Pulmonary/Chest: Effort normal. No respiratory distress.  Musculoskeletal: Normal range of motion.  Back: Diffuse lumbar spinous process tenderness. Lower lumbar paraspinous tenderness. Tenderness over bilateral SI joints. Negative straight leg raise. 5/5 plantar and dorsiflexion on L, 4/5 on R. 5/5 on L quads, 4/5 on R.  R hip: Tenderness over the R greater trochanter but not the L. Good external rotation. Significant pain with internal rotation and R hip flexion. L hip is normal.   Neurological: She is alert and oriented to person, place, and time.  Reflex Scores:      Patellar reflexes are 2+ on the right side and 2+ on the left side.      Achilles reflexes are 2+ on the right side and 2+ on the left side. Skin: Skin is warm and dry.  Psychiatric: She has a normal mood and affect. Her behavior is normal.  Nursing note and vitals reviewed.    [facility] reading (PRIMARY) by  Dr.  Clelia Croft. L-spine: Relatively well preserved disc spaces but some mild ddd in L1-2 and L4-5 Right hip: mild arthritis.  Dg Lumbar Spine Complete  02/13/2015   CLINICAL DATA:  Gradually worsening and persistent right lower back pain for 4 days. No known injury. Right-sided sciatica.  EXAM: LUMBAR SPINE - COMPLETE 4+ VIEW  COMPARISON:  Abdominal CT 09/20/2014  FINDINGS: No acute fracture, endplate erosion, or focal bone lesion.  L4-5 and L5-S1 facet arthropathy with grade 1 anterolisthesis at L4-5. The L2-3 and L3-4 discs appear narrowed, but there is no notable endplate degenerative change.  IMPRESSION: 1. No acute osseous findings. 2. Lower lumbar facet arthropathy with mild L4-5 anterolisthesis. 3. L2-3 and L3-4 mild disc narrowing.   Electronically Signed   By: Marnee Spring M.D.   On: 02/13/2015 18:01   Dg Hip Unilat With Pelvis 2-3 Views Right  02/13/2015   CLINICAL DATA:  Acute right hip pain over the trochanteric area with rotation.  EXAM: RIGHT HIP (WITH PELVIS) 2-3 VIEWS  COMPARISON:  None.  FINDINGS: There is no evidence of hip fracture or dislocation. There is no evidence of arthropathy or other focal bone abnormality.  IMPRESSION: Negative.   Electronically Signed   By: Marnee Spring M.D.   On: 02/13/2015 18:02    Assessment & Plan:   1. Asthma, chronic, mild persistent, with acute exacerbation - taper of prednisone while on zpack   2. Essential hypertension   3. Degeneration of lumbar or lumbosacral intervertebral disc - pt reports excellent response to prior series of cortisone inj 10 years ago - sxs have woresned so repeat lumbar MRI (prior in 2007) and will refer to Dr. Nickola Major to see if injections can be done again - prednisone taper today, recheck in 2 wks  4. Hip pain, acute, right - having ttp over Si joint, greater trochanter and with internal rotation -xray normal - could be trochanteric bursitis  Vs radiculopathy - refer to pain management to see if she would respond to  cortisone injection    Orders Placed This Encounter  Procedures  . DG Lumbar Spine Complete  Standing Status: Future     Number of Occurrences: 1     Standing Expiration Date: 02/14/2015    Order Specific Question:  Reason for Exam (SYMPTOM  OR DIAGNOSIS REQUIRED)    Answer:  pain with right sciatica    Order Specific Question:  Is the patient pregnant?    Answer:  No    Order Specific Question:  Preferred imaging location?    Answer:  External  . DG HIP UNILAT WITH PELVIS 2-3 VIEWS RIGHT    Standing Status: Future     Number of Occurrences: 1     Standing Expiration Date: 04/14/2016    Order Specific Question:  Reason for Exam (SYMPTOM  OR DIAGNOSIS REQUIRED)    Answer:  pain over lateral trochanteric head and with internal rotation    Order Specific Question:  Is the patient pregnant?    Answer:  No    Order Specific Question:  Preferred imaging location?    Answer:  External      I personally performed the services described in this documentation, which was scribed in my presence. The recorded information has been reviewed and considered, and addended by me as needed.  Norberto SorensonEva Shaw, MD MPH

## 2015-02-18 ENCOUNTER — Ambulatory Visit (HOSPITAL_COMMUNITY)
Admission: RE | Admit: 2015-02-18 | Discharge: 2015-02-18 | Disposition: A | Discharge status: Home / Self Care | Payer: 59 | Source: Ambulatory Visit | Attending: Family Medicine | Admitting: Family Medicine

## 2015-02-18 DIAGNOSIS — M25551 Pain in right hip: Secondary | ICD-10-CM

## 2015-02-18 DIAGNOSIS — M5137 Other intervertebral disc degeneration, lumbosacral region: Secondary | ICD-10-CM

## 2015-02-21 ENCOUNTER — Other Ambulatory Visit: Payer: Self-pay | Admitting: Family Medicine

## 2015-02-23 ENCOUNTER — Ambulatory Visit
Admission: RE | Admit: 2015-02-23 | Discharge: 2015-02-23 | Disposition: A | Discharge status: Home / Self Care | Payer: 59 | Source: Ambulatory Visit | Attending: Family Medicine | Admitting: Family Medicine

## 2015-02-28 ENCOUNTER — Ambulatory Visit (INDEPENDENT_AMBULATORY_CARE_PROVIDER_SITE_OTHER): Payer: 59 | Admitting: Family Medicine

## 2015-02-28 ENCOUNTER — Encounter: Payer: Self-pay | Admitting: Family Medicine

## 2015-02-28 VITALS — BP 130/85 | HR 93 | Temp 98.3°F | Resp 16 | Ht 62.75 in | Wt 304.4 lb

## 2015-02-28 DIAGNOSIS — M5416 Radiculopathy, lumbar region: Secondary | ICD-10-CM | POA: Diagnosis not present

## 2015-02-28 DIAGNOSIS — M5136 Other intervertebral disc degeneration, lumbar region: Secondary | ICD-10-CM | POA: Diagnosis not present

## 2015-02-28 DIAGNOSIS — M797 Fibromyalgia: Secondary | ICD-10-CM | POA: Diagnosis not present

## 2015-02-28 DIAGNOSIS — G43809 Other migraine, not intractable, without status migrainosus: Secondary | ICD-10-CM

## 2015-02-28 DIAGNOSIS — G8929 Other chronic pain: Secondary | ICD-10-CM

## 2015-02-28 MED ORDER — TELMISARTAN-HCTZ 80-12.5 MG PO TABS: 1.0000 | ORAL_TABLET | Freq: Every day | ORAL | Status: DC

## 2015-02-28 MED ORDER — SUMATRIPTAN SUCCINATE 100 MG PO TABS: 100.0000 mg | ORAL_TABLET | ORAL | Status: DC | PRN

## 2015-02-28 MED ORDER — CARISOPRODOL 350 MG PO TABS: 350.0000 mg | ORAL_TABLET | Freq: Four times a day (QID) | ORAL | Status: DC | PRN

## 2015-02-28 MED ORDER — AMPHETAMINE-DEXTROAMPHETAMINE 7.5 MG PO TABS: 7.5000 mg | ORAL_TABLET | Freq: Two times a day (BID) | ORAL | Status: DC

## 2015-02-28 MED ORDER — TRAMADOL HCL 50 MG PO TABS: 50.0000 mg | ORAL_TABLET | Freq: Four times a day (QID) | ORAL | Status: DC

## 2015-02-28 MED ORDER — PREGABALIN 150 MG PO CAPS: 150.0000 mg | ORAL_CAPSULE | Freq: Three times a day (TID) | ORAL | Status: DC

## 2015-02-28 MED ORDER — AMPHETAMINE-DEXTROAMPHETAMINE 7.5 MG PO TABS
7.5000 mg | ORAL_TABLET | Freq: Two times a day (BID) | ORAL | Status: DC
Start: 2015-02-28 — End: 2015-05-23

## 2015-02-28 NOTE — Progress Notes (Addendum)
Subjective:    Patient ID: Gloria Carter, female    DOB: [birth year], 49 y.o.   MRN: [id number] This chart was scribed for Gloria MochaEva N Shaw, MD by Leona CarryG. Clay Sherrill, ED Scribe. The patient's care was started at 5:09 PM.   Chief Complaint  Patient presents with  . Follow-up    discuss her MRI results  . Hip Pain    HPI Gloria Carter is a 49 y.o. Female. She was seen two weeks ago for acute, worsening back pain. She has a history of degenerative disc disease in 2007. Dr. Penni BombardKendall performed epidural injections at that time, which alleviated the pain. Patient was given an oral prednisone taper in addition to PRN hydrocodone in hopes of helping her with the pain. These medications were in addition to her chronic pain medicine. She was sent for imaging, which confirmed degenerative disc disease with right L4 nerve impingement.   Patient is here today to follow-up on the MRI and persistent back pain. She reports that associated posterior right hip pain. She states that the pain is exacerbated by standing. Patient reports that she sometimes experiences numbness in her right leg when standing for extended periods of time. She states that she has continued to use the hydrocodone PRN. She estimates that she uses an average of one pill per day, but states that she does not use it daily.   She experienced cold symptoms in the past two weeks, but states that they have largely resolved. She reports mild continued congestion and a cough productive of yellow-green sputum.  She has taken Mucinex DM.   She recently purchased a walker, which has helped her be more mobile at work.  Past Medical History  Diagnosis Date  . Asthma   . Dysphagia   . Fibromyalgia   . Migraine   . GERD (gastroesophageal reflux disease)   . ADD (attention deficit disorder)   . Hernia of abdominal wall   . Allergy     ENVIRONMENTAL  . Hypertension   . Thyroid disease   . Fibromyalgia   . Numbness and tingling in hands     . Arthritis     knees  . IBS (irritable bowel syndrome)   . Difficulty sleeping   . Depression   . Swelling of both ankles    Current Outpatient Prescriptions on File Prior to Visit  Medication Sig Dispense Refill  . albuterol (PROVENTIL HFA;VENTOLIN HFA) 108 (90 BASE) MCG/ACT inhaler Inhale 2 puffs into the lungs every 6 (six) hours as needed for wheezing or shortness of breath.    . amphetamine-dextroamphetamine (ADDERALL) 7.5 MG tablet Take 1 tablet by mouth 2 (two) times daily. 60 tablet 0  . amphetamine-dextroamphetamine (ADDERALL) 7.5 MG tablet Take 1 tablet by mouth 2 (two) times daily. May fill on or after 30 days from date written 60 tablet 0  . amphetamine-dextroamphetamine (ADDERALL) 7.5 MG tablet Take 1 tablet by mouth 2 (two) times daily. May fill 60 days from date written. 60 tablet 0  . azelastine (OPTIVAR) 0.05 % ophthalmic solution Place 2 drops into both eyes 2 (two) times daily. 6 mL 2  . B Complex-C-Folic Acid (B-COMPLEX BALANCED PO) Take 1 tablet by mouth daily.     . calcium carbonate (OS-CAL) 600 MG TABS Take 600 mg by mouth daily.    . carisoprodol (SOMA) 350 MG tablet Take 1 tablet (350 mg total) by mouth 4 (four) times daily as needed for muscle spasms. 120 tablet 2  . cetirizine (ZYRTEC)  10 MG tablet Take 10 mg by mouth daily as needed for allergies.     . cholecalciferol (VITAMIN D) 400 UNITS TABS Take 400 Units by mouth daily.    . citalopram (CELEXA) 10 MG tablet Take 1 tablet (10 mg total) by mouth every evening. 90 tablet 3  . esomeprazole (NEXIUM) 40 MG capsule Take 1 capsule (40 mg total) by mouth daily. 90 capsule 3  . Fluticasone-Salmeterol (ADVAIR) 100-50 MCG/DOSE AEPB Inhale 1 puff into the lungs 2 (two) times daily. 60 each 11  . HYDROcodone-acetaminophen (NORCO/VICODIN) 5-325 MG per tablet Take 1-2 tablets by mouth every 6 (six) hours as needed for moderate pain. 60 tablet 0  . ibuprofen (ADVIL,MOTRIN) 200 MG tablet Take 400-600 mg by mouth every 8  (eight) hours as needed for moderate pain.     . montelukast (SINGULAIR) 10 MG tablet Take 1 tablet (10 mg total) by mouth every morning. 90 tablet 3  . Multiple Vitamin (MULTIVITAMIN) tablet Take 1 tablet by mouth daily.    Gloria Carter. Polyethyl Glycol-Propyl Glycol (SYSTANE) 0.4-0.3 % SOLN Apply 1 drop to eye 3 (three) times daily as needed (dry eyes).    . predniSONE (DELTASONE) 20 MG tablet Take 4 tabs po x 3d, 3 tabs po qd x 3d, 2 tabs po qd x 3d, 1 tabs po qd x 3d 30 tablet 0  . pregabalin (LYRICA) 150 MG capsule Take 1 capsule (150 mg total) by mouth 3 (three) times daily. 270 capsule 1  . PROVENTIL HFA 108 (90 BASE) MCG/ACT inhaler INHALE TWO PUFFS BY MOUTH EVERY SIX HOURS AS NEEDED  6.7 g 3  . ranitidine (ZANTAC) 150 MG tablet Take 150 mg by mouth 2 (two) times daily as needed for heartburn.    . Red Yeast Rice 600 MG CAPS Take 600 mg by mouth daily.    . SUMAtriptan (IMITREX) 100 MG tablet Take 1 tablet (100 mg total) by mouth every 2 (two) hours as needed for migraine. 4 tablet 11  . SYNTHROID 50 MCG tablet Take 1 tablet (50 mcg total) by mouth daily before breakfast. 90 tablet 1  . telmisartan-hydrochlorothiazide (MICARDIS HCT) 80-12.5 MG per tablet TAKE ONE TABLET BY MOUTH ONE TIME DAILY IN THE EVENING 90 tablet 0  . traMADol (ULTRAM) 50 MG tablet Take 1 tablet (50 mg total) by mouth 4 (four) times daily. 120 tablet 2  . vitamin C (ASCORBIC ACID) 500 MG tablet Take 1,000 mg by mouth daily.    . [DISCONTINUED] hyoscyamine (CYSTOSPAZ) 0.15 MG tablet Take 0.15 mg by mouth every 4 (four) hours as needed.     No current facility-administered medications on file prior to visit.   Allergies  Allergen Reactions  . Soap     Any soaps with fragrance cause migraines  . Sulfa Antibiotics Nausea And Vomiting      Review of Systems  Constitutional: Positive for activity change and fatigue. Negative for chills, appetite change and unexpected weight change.  HENT: Positive for congestion.     Respiratory: Positive for cough.   Cardiovascular: Positive for leg swelling. Negative for chest pain.  Gastrointestinal: Negative for vomiting.  Musculoskeletal: Positive for myalgias, back pain, joint swelling, arthralgias, gait problem, neck pain and neck stiffness.  Neurological: Positive for weakness, light-headedness and headaches. Negative for dizziness.  Hematological: Negative for adenopathy. Does not bruise/bleed easily.  Psychiatric/Behavioral: Positive for sleep disturbance, dysphoric mood and decreased concentration. Negative for behavioral problems, confusion and agitation. The patient is not nervous/anxious.  Objective:   Physical Exam  Constitutional: She is oriented to person, place, and time. She appears well-developed and well-nourished. No distress.  HENT:  Head: Normocephalic and atraumatic.  Right TM retracted. Effusion in left TM.  Eyes: Conjunctivae and EOM are normal.  Neck: Neck supple. No tracheal deviation present.  Cardiovascular: Normal rate.   Pulmonary/Chest: Effort normal. No respiratory distress.  Musculoskeletal: Normal range of motion.  Lymphadenopathy:    She has no cervical adenopathy.  Neurological: She is alert and oriented to person, place, and time.  Skin: Skin is warm and dry.  Psychiatric: She has a normal mood and affect. Her behavior is normal.  Nursing note and vitals reviewed.  Filed Vitals:   02/28/15 1509  BP: 130/85  Pulse: 93  Temp: 98.3 F (36.8 C)  Resp: 16    02/23/15 Lumbar MRI:  IMPRESSION: 1. New right foraminal/extraforaminal disc extrusion at L4-5 likely affecting the right L4 nerve root. 2. Mildly progressive disc and facet degeneration elsewhere as above without stenosis. Assessment & Plan:   1. Fibromyalgia   2. Chronic pain   3. Other migraine without status migrainosus, not intractable   4. Lumbar degenerative disc disease - Pt does have a lot of additional chronic pain so would appreciate  specialty eval of her other conditions - arthritis, bursitis, etc to see if we could get better pain control for her at baseline to increase her activity/mobility  5. Acute right lumbar radiculopathy -  refer to ortho or pain management as pt wants to proceed w/ spinal injections as did so well on these 9 yrs ago.      Meds ordered this encounter  Medications  . amphetamine-dextroamphetamine (ADDERALL) 7.5 MG tablet    Sig: Take 1 tablet by mouth 2 (two) times daily.    Dispense:  60 tablet    Refill:  0  . amphetamine-dextroamphetamine (ADDERALL) 7.5 MG tablet    Sig: Take 1 tablet by mouth 2 (two) times daily. May fill on or after 30 days from date written    Dispense:  60 tablet    Refill:  0  . amphetamine-dextroamphetamine (ADDERALL) 7.5 MG tablet    Sig: Take 1 tablet by mouth 2 (two) times daily. May fill 60 days from date written.    Dispense:  60 tablet    Refill:  0  . carisoprodol (SOMA) 350 MG tablet    Sig: Take 1 tablet (350 mg total) by mouth 4 (four) times daily as needed for muscle spasms.    Dispense:  120 tablet    Refill:  2  . traMADol (ULTRAM) 50 MG tablet    Sig: Take 1 tablet (50 mg total) by mouth 4 (four) times daily.    Dispense:  120 tablet    Refill:  2  . telmisartan-hydrochlorothiazide (MICARDIS HCT) 80-12.5 MG per tablet    Sig: Take 1 tablet by mouth daily.    Dispense:  90 tablet    Refill:  3  . SUMAtriptan (IMITREX) 100 MG tablet    Sig: Take 1 tablet (100 mg total) by mouth every 2 (two) hours as needed for migraine.    Dispense:  4 tablet    Refill:  11  . pregabalin (LYRICA) 150 MG capsule    Sig: Take 1 capsule (150 mg total) by mouth 3 (three) times daily.    Dispense:  270 capsule    Refill:  1    I personally performed the services described in this documentation,  which was scribed in my presence. The recorded information has been reviewed and considered, and addended by me as needed.  Norberto Sorenson, MD MPH

## 2015-03-02 ENCOUNTER — Other Ambulatory Visit: Payer: Self-pay

## 2015-03-11 ENCOUNTER — Other Ambulatory Visit: Payer: Self-pay | Admitting: Family Medicine

## 2015-03-13 ENCOUNTER — Telehealth: Payer: Self-pay

## 2015-03-13 NOTE — Telephone Encounter (Signed)
Pt says Dr Clelia CroftShaw was supposed to place a referral for her to go to neurology for injections. She was seen 02/28/15.

## 2015-03-13 NOTE — Telephone Encounter (Signed)
Dr Clelia Croftshaw-- Do you know about this?

## 2015-03-23 ENCOUNTER — Other Ambulatory Visit: Payer: Self-pay | Admitting: Family Medicine

## 2015-03-24 NOTE — Telephone Encounter (Signed)
Patient left a message in referrals requesting the status of her referral to a neurologist for injections in her back. Per patient she has been waiting 3 weeks now and has not heard anything. I dont see any referrals in her chart for neurology. Patients call back number is [phone]. Patient is requesting someone to call her back as soon as possible to let her know it has been done.

## 2015-03-25 NOTE — Addendum Note (Signed)
Addended by: Norberto Sorenson on: 03/25/2015 05:54 PM   Modules accepted: Orders

## 2015-03-25 NOTE — Telephone Encounter (Signed)
Referral placed, so sorry I overlooked this. Hopefully the wait for an appt isn't to long. Marland Kitchen Marland Kitchen

## 2015-04-16 ENCOUNTER — Other Ambulatory Visit: Payer: Self-pay | Admitting: Family Medicine

## 2015-04-16 ENCOUNTER — Encounter: Payer: Self-pay | Admitting: Family Medicine

## 2015-04-24 ENCOUNTER — Telehealth: Payer: Self-pay

## 2015-04-24 NOTE — Telephone Encounter (Signed)
FYI-patient left a message today in referrals in regards to her referral to [facility]. I still didn't see a referral ordered for her but I did see a note in her chart stating to refer her. I have contacted patient back to let her know I am sending it today and will mark it urgent for her. I apologized for the delay and stated she should hear something from their office shortly. Patient was ok with this-just wants to get an appointment soon.

## 2015-04-25 NOTE — Telephone Encounter (Signed)
[facility] or Dr. Nickola Majoralton-Bethea at [facility] - first avail - for treatment of Rt L4 nerve root impingement by DDD seen on MRI 02/23/15 - pt interested in injection therapy.  Pt was [facility]othy [facility] on 03/28/15 at 11 am. Did she go to this?  Did she want a second opinion?  It looks like I entered this as an orthopedics referral rather than a neurosurg referral but i was for the same thing.  If she wants to go to [facility] instead or in addition that is great - just wanted to make sure that pt knew that that was what her appt last mo was for and when we had last talked she was interested in injection therapy but not surgery in which case Dr. Nickola Majoralton-Bethea would likely be a better match for her.  Whatever she wants is fine.  How did her appt with Dr. Eulah PontMurphy go?

## 2015-05-01 NOTE — Telephone Encounter (Signed)
Patient has been scheduled at [facility] today 05/01/15 at 2 pm with Dr Jeral Fruit. Far as patients referral to Orthopedic specialist she was seen on 03/28/15 11 am by Dr Willette Cluster at [facility].

## 2015-05-06 ENCOUNTER — Other Ambulatory Visit: Payer: Self-pay | Admitting: Family Medicine

## 2015-05-13 ENCOUNTER — Other Ambulatory Visit: Payer: Self-pay | Admitting: Family Medicine

## 2015-05-13 ENCOUNTER — Other Ambulatory Visit: Payer: Self-pay | Admitting: Physician Assistant

## 2015-05-22 DIAGNOSIS — M5137 Other intervertebral disc degeneration, lumbosacral region: Secondary | ICD-10-CM | POA: Insufficient documentation

## 2015-05-22 DIAGNOSIS — M5416 Radiculopathy, lumbar region: Secondary | ICD-10-CM | POA: Insufficient documentation

## 2015-05-22 NOTE — Progress Notes (Addendum)
Subjective:    Patient ID: Gloria Carter    DOB: [birth year], 49 y.o.   MRN: [id number]   Chief Complaint  Patient presents with  . Medication Refill     HPI Gloria Carter is a 49 y.o. female with a history of degenerative disc disease in 2007 that responded very well to epidural infections by Dr. Penni Bombard. Pt had a severe exacerbation of her lumbar back pain 3 mos prior and MRI showed egenerative disc disease with right L4 nerve impingement. She never saw ortho (was referred to Dr. Thurston Hole but this was unintentional so pt didn't go) but did finally get in with neurosurgery [name] at 05/01/15) as she was having trouble standing as that exacerbating pain and also caused intermittent Rt leg numbness to the point that she was requiring a walker. She reports that he thought the MRI looked pretty good but could see that she had severe dysfunction on flexion films so she is scheduled for her first lumbar injection with him today.  States she absolutely does not want to go to PT for anything - doesn't work and makes her fibromyalgia to much worse.  Pt does have chronic pain syndrome and is on a controlled drug contract with me on chronic tramadol but has been using prn hydrocodone very occ as well over the past sev mos. Even with the severe flair in her pain she stayed to using 1 tab/day and now states she still has plenty left from the #60 I rx'ed her over 3 mos prior.  Pt states the walker has provided her more pain relief than the hydrocodone  Labs last done 9 mos prior with nml cmp, cbc, and urine   Very mild pre-DM hgba1c 5.8 1 yr prior - unchanged today.  Hypothyroidism w/ nml tsh 1 yr prev - has been on same levothyroxine dose for years but she does not want to try off of it.  Feels that it is not effective and has been wanting to try a stronger dose for years but has never been tried. Never tried anything else other than current  levothyroxine.  Breathing has been worse - the recent moisture and heat has been triggering her asthma.  She is using it in the morning and then 1-2x more during the day or night but no nighttime wakenings.  Pt's mother has severe COPD and was a non-smoker but her mother was expeosed to a lot of second hand smoke with her parents and pt's father.  Past Medical History  Diagnosis Date  . Asthma   . Dysphagia   . Fibromyalgia   . Migraine   . GERD (gastroesophageal reflux disease)   . ADD (attention deficit disorder)   . Hernia of abdominal wall   . Allergy     ENVIRONMENTAL  . Hypertension   . Thyroid disease   . Fibromyalgia   . Numbness and tingling in hands   . Arthritis     knees  . IBS (irritable bowel syndrome)   . Difficulty sleeping   . Depression   . Swelling of both ankles    Current Outpatient Prescriptions on File Prior to Visit  Medication Sig Dispense Refill  . albuterol (PROVENTIL HFA) 108 (90 BASE) MCG/ACT inhaler INHALE TWO PUFFS BY MOUTH EVERY SIX HOURS AS NEEDED 6.7 Inhaler 0  . amphetamine-dextroamphetamine (ADDERALL) 7.5 MG tablet Take 1 tablet by mouth 2 (two) times daily. 60 tablet 0  . amphetamine-dextroamphetamine (ADDERALL) 7.5 MG tablet Take 1  tablet by mouth 2 (two) times daily. May fill on or after 30 days from date written 60 tablet 0  . amphetamine-dextroamphetamine (ADDERALL) 7.5 MG tablet Take 1 tablet by mouth 2 (two) times daily. May fill 60 days from date written. 60 tablet 0  . azelastine (OPTIVAR) 0.05 % ophthalmic solution Place 2 drops into both eyes 2 (two) times daily. 6 mL 2  . B Complex-C-Folic Acid (B-COMPLEX BALANCED PO) Take 1 tablet by mouth daily.     . calcium carbonate (OS-CAL) 600 MG TABS Take 600 mg by mouth daily.    . carisoprodol (SOMA) 350 MG tablet Take 1 tablet (350 mg total) by mouth 4 (four) times daily as needed for muscle spasms. 120 tablet 2  . cetirizine (ZYRTEC) 10 MG tablet Take 10 mg by mouth daily as needed for  allergies.     . cholecalciferol (VITAMIN D) 400 UNITS TABS Take 400 Units by mouth daily.    . citalopram (CELEXA) 10 MG tablet TAKE ONE TABLET BY MOUTH ONE TIME DAILY IN THE P.M.  "OV NEEDED" 30 tablet 0  . esomeprazole (NEXIUM) 40 MG capsule TAKE ONE CAPSULE BY MOUTH ONE TIME DAILY 90 capsule 0  . Fluticasone-Salmeterol (ADVAIR) 100-50 MCG/DOSE AEPB Inhale 1 puff into the lungs 2 (two) times daily. 60 each 11  . HYDROcodone-acetaminophen (NORCO/VICODIN) 5-325 MG per tablet Take 1-2 tablets by mouth every 6 (six) hours as needed for moderate pain. 60 tablet 0  . ibuprofen (ADVIL,MOTRIN) 200 MG tablet Take 400-600 mg by mouth every 8 (eight) hours as needed for moderate pain.     . montelukast (SINGULAIR) 10 MG tablet TAKE ONE TABLET BY MOUTH IN THE MORNING 90 tablet 1  . Multiple Vitamin (MULTIVITAMIN) tablet Take 1 tablet by mouth daily.    Bertram Gala Glycol-Propyl Glycol (SYSTANE) 0.4-0.3 % SOLN Apply 1 drop to eye 3 (three) times daily as needed (dry eyes).    . pregabalin (LYRICA) 150 MG capsule Take 1 capsule (150 mg total) by mouth 3 (three) times daily. 270 capsule 1  . ranitidine (ZANTAC) 150 MG tablet Take 150 mg by mouth 2 (two) times daily as needed for heartburn.    . Red Yeast Rice 600 MG CAPS Take 600 mg by mouth daily.    . SUMAtriptan (IMITREX) 100 MG tablet Take 1 tablet (100 mg total) by mouth every 2 (two) hours as needed for migraine. 4 tablet 11  . SYNTHROID 50 MCG tablet TAKE 1 TABLET BY MOUTH DAILY BEFORE BREAKFAST **MAX 30 DAYS SUPPLY 30 tablet 4  . telmisartan-hydrochlorothiazide (MICARDIS HCT) 80-12.5 MG per tablet Take 1 tablet by mouth daily. 90 tablet 3  . traMADol (ULTRAM) 50 MG tablet Take 1 tablet (50 mg total) by mouth 4 (four) times daily. 120 tablet 2  . vitamin C (ASCORBIC ACID) 500 MG tablet Take 1,000 mg by mouth daily.    . [DISCONTINUED] hyoscyamine (CYSTOSPAZ) 0.15 MG tablet Take 0.15 mg by mouth every 4 (four) hours as needed.     No current  facility-administered medications on file prior to visit.   Allergies  Allergen Reactions  . Soap     Any soaps with fragrance cause migraines  . Sulfa Antibiotics Nausea And Vomiting      Review of Systems  Constitutional: Positive for fatigue. Negative for chills, activity change, appetite change and unexpected weight change.  Respiratory: Positive for cough, chest tightness, shortness of breath and wheezing.   Cardiovascular: Positive for leg swelling. Negative for chest  pain.  Gastrointestinal: Negative for vomiting.  Musculoskeletal: Positive for myalgias, back pain, joint swelling, arthralgias, gait problem, neck pain and neck stiffness.  Neurological: Positive for weakness, light-headedness and headaches. Negative for dizziness.  Hematological: Negative for adenopathy.  Psychiatric/Behavioral: Positive for sleep disturbance, dysphoric mood and decreased concentration. Negative for behavioral problems, confusion and agitation. The patient is not nervous/anxious.        Objective:   Physical Exam  Constitutional: She is oriented to person, place, and time. She appears well-developed and well-nourished. No distress.  Using rolling walker  HENT:  Head: Normocephalic and atraumatic.  Eyes: Conjunctivae and EOM are normal.  Neck: Neck supple. No tracheal deviation present. No thyroid mass and no thyromegaly present.  Cardiovascular: Normal rate, regular rhythm and normal heart sounds.   Pulmonary/Chest: Effort normal and breath sounds normal. No respiratory distress.  Musculoskeletal: Normal range of motion.  Lymphadenopathy:    She has no cervical adenopathy.  Neurological: She is alert and oriented to person, place, and time.  Skin: Skin is warm and dry.  Psychiatric: She has a normal mood and affect. Her behavior is normal.  Nursing note and vitals reviewed.  BP 145/86 mmHg  Pulse 102  Temp(Src) 98 F (36.7 C) (Oral)  Resp 18  Wt 307 lb (139.254 kg)  Results for  orders placed or performed in visit on 05/23/15  POCT glycosylated hemoglobin (Hb A1C)  Result Value Ref Range   Hemoglobin A1C 5.8     Assessment & Plan:   1. Fibromyalgia - refilled chronic meds  2. Chronic pain   3. Other migraine without status migrainosus, not intractable   4. Lumbar degenerative disc disease - pt point-blank refuses any PT ever - flairs up her fibromyalgia  5. Acute right lumbar radiculopathy - seen by neurosurgery Dr. Jeral Fruit and is sched for her first spinal injection today. Has an appt with Dr. Raeanne Barry coming up to address her chronic, worsening neck issues.  6.      Hypothyroid - on levothyroxine 50 but doesn't think it is strong enough so check full thyroid panel and if still mid to lower range will try increasing dose and recheck in 3 mos - pt warned of potential morbidities and is aware. She would be interested in switching to other med like armour or cytomel if she can't tolerate an increased levothyroxine dose - has never tried any other med and not seen endocrine prior.  tsh nml but in med range as are t3/t4 so will try increasing levothyroxine from 50 -:75 to see if pt has any symptomatic improvements so will need repeat tsh at next OV in 3 mos.  7.      Asthma - reviewed possibility of alpha-1 antitrypsin as copd in non-smoking mother but lots of second hand smoke exposure. Using alb reg >1x/d during summer so will try increasing advair from 100-50 to 250-50 then drop back down over winter when sxs improve. 8.     Pre-Dm - stable w/ a1cof 5.8 today unchanged from 1 yr prior. 9.     Obesity - check flp at next OV - last done 2 yrs ago with ASCVD risk 2% with total chol 232 and ldl 153 10.   Vit D def - start takingonce weekly vitamin D supplement x 6 months, then increase otc dose to 2000u daily.    Pt will continue to be seen every 3 months for medication refills Needs to sched CPE.  Norberto Sorenson, MD MPH

## 2015-05-23 ENCOUNTER — Ambulatory Visit (INDEPENDENT_AMBULATORY_CARE_PROVIDER_SITE_OTHER): Payer: 59 | Admitting: Family Medicine

## 2015-05-23 ENCOUNTER — Encounter: Payer: Self-pay | Admitting: Family Medicine

## 2015-05-23 VITALS — BP 145/86 | HR 102 | Temp 98.0°F | Resp 18 | Wt 307.0 lb

## 2015-05-23 DIAGNOSIS — R7303 Prediabetes: Secondary | ICD-10-CM

## 2015-05-23 DIAGNOSIS — J4531 Mild persistent asthma with (acute) exacerbation: Secondary | ICD-10-CM | POA: Diagnosis not present

## 2015-05-23 DIAGNOSIS — M797 Fibromyalgia: Secondary | ICD-10-CM

## 2015-05-23 DIAGNOSIS — Z5181 Encounter for therapeutic drug level monitoring: Secondary | ICD-10-CM

## 2015-05-23 DIAGNOSIS — M5416 Radiculopathy, lumbar region: Secondary | ICD-10-CM | POA: Diagnosis not present

## 2015-05-23 DIAGNOSIS — Z6841 Body Mass Index (BMI) 40.0 and over, adult: Secondary | ICD-10-CM

## 2015-05-23 DIAGNOSIS — M5137 Other intervertebral disc degeneration, lumbosacral region: Secondary | ICD-10-CM

## 2015-05-23 DIAGNOSIS — J45909 Unspecified asthma, uncomplicated: Secondary | ICD-10-CM

## 2015-05-23 DIAGNOSIS — I10 Essential (primary) hypertension: Secondary | ICD-10-CM

## 2015-05-23 DIAGNOSIS — M51379 Other intervertebral disc degeneration, lumbosacral region without mention of lumbar back pain or lower extremity pain: Secondary | ICD-10-CM

## 2015-05-23 DIAGNOSIS — K589 Irritable bowel syndrome without diarrhea: Secondary | ICD-10-CM

## 2015-05-23 DIAGNOSIS — E785 Hyperlipidemia, unspecified: Secondary | ICD-10-CM

## 2015-05-23 DIAGNOSIS — F988 Other specified behavioral and emotional disorders with onset usually occurring in childhood and adolescence: Secondary | ICD-10-CM

## 2015-05-23 DIAGNOSIS — R7309 Other abnormal glucose: Secondary | ICD-10-CM | POA: Diagnosis not present

## 2015-05-23 DIAGNOSIS — E039 Hypothyroidism, unspecified: Secondary | ICD-10-CM | POA: Diagnosis not present

## 2015-05-23 DIAGNOSIS — G8929 Other chronic pain: Secondary | ICD-10-CM | POA: Diagnosis not present

## 2015-05-23 DIAGNOSIS — E559 Vitamin D deficiency, unspecified: Secondary | ICD-10-CM

## 2015-05-23 DIAGNOSIS — F909 Attention-deficit hyperactivity disorder, unspecified type: Secondary | ICD-10-CM | POA: Diagnosis not present

## 2015-05-23 LAB — COMPREHENSIVE METABOLIC PANEL
ALBUMIN: 4 g/dL (ref 3.6–5.1)
ALK PHOS: 91 U/L (ref 33–130)
ALT: 21 U/L (ref 6–29)
AST: 21 U/L (ref 10–35)
BUN: 15 mg/dL (ref 7–25)
CO2: 29 mmol/L (ref 20–31)
Calcium: 9.3 mg/dL (ref 8.6–10.4)
Chloride: 101 mmol/L (ref 98–110)
Creat: 0.8 mg/dL (ref 0.50–1.05)
GLUCOSE: 91 mg/dL (ref 65–99)
POTASSIUM: 4 mmol/L (ref 3.5–5.3)
SODIUM: 140 mmol/L (ref 135–146)
Total Bilirubin: 0.5 mg/dL (ref 0.2–1.2)
Total Protein: 6.8 g/dL (ref 6.1–8.1)

## 2015-05-23 LAB — T3, FREE: T3 FREE: 2.9 pg/mL (ref 2.3–4.2)

## 2015-05-23 LAB — TSH: TSH: 2.087 u[IU]/mL (ref 0.350–4.500)

## 2015-05-23 LAB — T4, FREE: FREE T4: 1.13 ng/dL (ref 0.80–1.80)

## 2015-05-23 LAB — VITAMIN D 25 HYDROXY (VIT D DEFICIENCY, FRACTURES): VIT D 25 HYDROXY: 19 ng/mL — AB (ref 30–100)

## 2015-05-23 LAB — POCT GLYCOSYLATED HEMOGLOBIN (HGB A1C): Hemoglobin A1C: 5.8

## 2015-05-23 MED ORDER — FLUTICASONE-SALMETEROL 250-50 MCG/DOSE IN AEPB: 1.0000 | INHALATION_SPRAY | Freq: Two times a day (BID) | RESPIRATORY_TRACT | Status: DC

## 2015-05-23 MED ORDER — CARISOPRODOL 350 MG PO TABS: 350.0000 mg | ORAL_TABLET | Freq: Four times a day (QID) | ORAL | Status: DC | PRN

## 2015-05-23 MED ORDER — ALBUTEROL SULFATE HFA 108 (90 BASE) MCG/ACT IN AERS: INHALATION_SPRAY | RESPIRATORY_TRACT | Status: DC

## 2015-05-23 MED ORDER — TRAMADOL HCL 50 MG PO TABS: 50.0000 mg | ORAL_TABLET | Freq: Four times a day (QID) | ORAL | Status: DC

## 2015-05-23 MED ORDER — AMPHETAMINE-DEXTROAMPHETAMINE 7.5 MG PO TABS: 7.5000 mg | ORAL_TABLET | Freq: Two times a day (BID) | ORAL | Status: DC

## 2015-06-09 ENCOUNTER — Other Ambulatory Visit: Payer: Self-pay | Admitting: Family Medicine

## 2015-06-09 DIAGNOSIS — E559 Vitamin D deficiency, unspecified: Secondary | ICD-10-CM | POA: Insufficient documentation

## 2015-06-09 MED ORDER — LEVOTHYROXINE SODIUM 75 MCG PO TABS
75.0000 ug | ORAL_TABLET | Freq: Every day | ORAL | Status: DC
Start: 2015-06-09 — End: 2015-08-22

## 2015-06-09 MED ORDER — VITAMIN D (ERGOCALCIFEROL) 1.25 MG (50000 UNIT) PO CAPS: 50000.0000 [IU] | ORAL_CAPSULE | ORAL | Status: DC

## 2015-06-09 NOTE — Addendum Note (Signed)
Addended by: Norberto Sorenson on: 06/09/2015 12:29 AM   Modules accepted: Orders

## 2015-06-12 ENCOUNTER — Other Ambulatory Visit: Payer: Self-pay | Admitting: Family Medicine

## 2015-07-08 ENCOUNTER — Other Ambulatory Visit: Payer: Self-pay | Admitting: Family Medicine

## 2015-08-04 ENCOUNTER — Other Ambulatory Visit: Payer: Self-pay | Admitting: Physician Assistant

## 2015-08-04 ENCOUNTER — Other Ambulatory Visit: Payer: Self-pay | Admitting: Family Medicine

## 2015-08-21 ENCOUNTER — Encounter: Payer: Self-pay | Admitting: Family Medicine

## 2015-08-21 ENCOUNTER — Ambulatory Visit (INDEPENDENT_AMBULATORY_CARE_PROVIDER_SITE_OTHER): Payer: 59 | Admitting: Family Medicine

## 2015-08-21 ENCOUNTER — Ambulatory Visit: Payer: 59 | Admitting: Family Medicine

## 2015-08-21 VITALS — BP 153/85 | HR 97 | Temp 97.6°F | Resp 16 | Ht 63.0 in | Wt 305.0 lb

## 2015-08-21 DIAGNOSIS — E039 Hypothyroidism, unspecified: Secondary | ICD-10-CM | POA: Diagnosis not present

## 2015-08-21 DIAGNOSIS — M797 Fibromyalgia: Secondary | ICD-10-CM

## 2015-08-21 DIAGNOSIS — Z6841 Body Mass Index (BMI) 40.0 and over, adult: Secondary | ICD-10-CM | POA: Diagnosis not present

## 2015-08-21 DIAGNOSIS — K589 Irritable bowel syndrome without diarrhea: Secondary | ICD-10-CM

## 2015-08-21 DIAGNOSIS — J4541 Moderate persistent asthma with (acute) exacerbation: Secondary | ICD-10-CM | POA: Diagnosis not present

## 2015-08-21 DIAGNOSIS — R05 Cough: Secondary | ICD-10-CM

## 2015-08-21 DIAGNOSIS — I10 Essential (primary) hypertension: Secondary | ICD-10-CM

## 2015-08-21 DIAGNOSIS — G8929 Other chronic pain: Secondary | ICD-10-CM | POA: Diagnosis not present

## 2015-08-21 DIAGNOSIS — Z5181 Encounter for therapeutic drug level monitoring: Secondary | ICD-10-CM

## 2015-08-21 DIAGNOSIS — R7303 Prediabetes: Secondary | ICD-10-CM | POA: Diagnosis not present

## 2015-08-21 DIAGNOSIS — R058 Other specified cough: Secondary | ICD-10-CM

## 2015-08-21 LAB — POCT CBC
Granulocyte percent: 63.8 %G (ref 37–80)
HCT, POC: 36.5 % — AB (ref 37.7–47.9)
Hemoglobin: 12.3 g/dL (ref 12.2–16.2)
Lymph, poc: 2.7 (ref 0.6–3.4)
MCH: 28.7 pg (ref 27–31.2)
MCHC: 33.6 g/dL (ref 31.8–35.4)
MCV: 85.2 fL (ref 80–97)
MID (CBC): 0.8 (ref 0–0.9)
MPV: 9 fL (ref 0–99.8)
PLATELET COUNT, POC: 193 10*3/uL (ref 142–424)
POC Granulocyte: 6.1 (ref 2–6.9)
POC LYMPH %: 27.9 % (ref 10–50)
POC MID %: 8.3 % (ref 0–12)
RBC: 4.28 M/uL (ref 4.04–5.48)
RDW, POC: 14.4 %
WBC: 9.5 10*3/uL (ref 4.6–10.2)

## 2015-08-21 LAB — TSH: TSH: 1.049 u[IU]/mL (ref 0.350–4.500)

## 2015-08-21 MED ORDER — CITALOPRAM HYDROBROMIDE 10 MG PO TABS: 10.0000 mg | ORAL_TABLET | Freq: Every day | ORAL | Status: DC

## 2015-08-21 MED ORDER — HYDROCOD POLST-CPM POLST ER 10-8 MG/5ML PO SUER: 5.0000 mL | Freq: Every evening | ORAL | Status: DC | PRN

## 2015-08-21 MED ORDER — ALBUTEROL SULFATE HFA 108 (90 BASE) MCG/ACT IN AERS: INHALATION_SPRAY | RESPIRATORY_TRACT | Status: DC

## 2015-08-21 MED ORDER — FLUTICASONE-SALMETEROL 250-50 MCG/DOSE IN AEPB: 1.0000 | INHALATION_SPRAY | Freq: Two times a day (BID) | RESPIRATORY_TRACT | Status: DC

## 2015-08-21 MED ORDER — TRAMADOL HCL 50 MG PO TABS: 50.0000 mg | ORAL_TABLET | Freq: Four times a day (QID) | ORAL | Status: DC

## 2015-08-21 MED ORDER — AMPHETAMINE-DEXTROAMPHETAMINE 7.5 MG PO TABS: 7.5000 mg | ORAL_TABLET | Freq: Two times a day (BID) | ORAL | Status: DC

## 2015-08-21 MED ORDER — BENZONATATE 200 MG PO CAPS: 200.0000 mg | ORAL_CAPSULE | Freq: Three times a day (TID) | ORAL | Status: DC | PRN

## 2015-08-21 MED ORDER — ESOMEPRAZOLE MAGNESIUM 40 MG PO CPDR: DELAYED_RELEASE_CAPSULE | ORAL | Status: DC

## 2015-08-21 MED ORDER — CARISOPRODOL 350 MG PO TABS: 350.0000 mg | ORAL_TABLET | Freq: Four times a day (QID) | ORAL | Status: DC | PRN

## 2015-08-21 MED ORDER — PREGABALIN 150 MG PO CAPS: 150.0000 mg | ORAL_CAPSULE | Freq: Three times a day (TID) | ORAL | Status: DC

## 2015-08-21 MED ORDER — MONTELUKAST SODIUM 10 MG PO TABS: 10.0000 mg | ORAL_TABLET | Freq: Every morning | ORAL | Status: DC

## 2015-08-21 NOTE — Progress Notes (Signed)
Subjective:    Patient ID: Gloria Carter, female    DOB: [birth year], 49 y.o.   MRN: [id number] Chief Complaint  Patient presents with  . Follow-up  . Thyroid Problem    HPI  [name] is a pleasant 49 yo woman who is here today for her every 3 mo visit to review chronic medical conditions and refill controlled meds.  Lumbar radiculopathy: Has had 3 injections - 2 with the spinal column and 1 with the hip joints - with Dr. Raeanne Barry after eval from Dr. Jeral Fruit advised no role for surgical intervention at this time.  Still having occ numbness in Rt leg after standing for a while. Still using cane around house but usually using walker. If cane is used to much will get more pain. Going to do cortisone inj every 3 months. Thinking about starting on a recumbant bike. She was advised to [facility] which she is going to sched - contemplative. Reports all [facility] water aerobics/walking are only avail during daytime working hours so she cannot attend.  Arthralgias: Did cortisone into knees from Dr. Eulah Pont which were even more painful to receive than the ones in her feet though she did end up getting some relief from them.  HTN: Has home blood pressure cuff -BP is more 120s-130s/70-85 at home.   Asthma: Used inhaler on the way over. Has had URI for a month - has improved but still has cough that will build up and exac at work. Still with nasal congestion , lots of cough drops and using alb inh bid as well as on zyrtec and singulair.  F/c/sweats resolved.   Sleeping ok - "sleeps like a taco".    Obesity: severely impacting her chronic back and knee pain.  Never saw nutritionist last yr and but Reighlynn thinks she does want to call and sched now since weight was gained back after prior abd surgery.  She saw nutritionist just once before. Has a lot of common sense about diet but also has a lot of diet restrictions - can't eat onions, peppers, tomatoes - so she is  worried that they might not be able to help her much.  Depression screen [facility] 2/9 08/21/2015 05/23/2015 02/13/2015 08/23/2014 08/31/2013  Decreased Interest 0 0 0 0 0  Down, Depressed, Hopeless 0 0 0 0 0  PHQ - 2 Score 0 0 0 0 0   Past Medical History  Diagnosis Date  . Asthma   . Dysphagia   . Fibromyalgia   . Migraine   . GERD (gastroesophageal reflux disease)   . ADD (attention deficit disorder)   . Hernia of abdominal wall   . Allergy     ENVIRONMENTAL  . Hypertension   . Thyroid disease   . Fibromyalgia   . Numbness and tingling in hands   . Arthritis     knees  . IBS (irritable bowel syndrome)   . Difficulty sleeping   . Depression   . Swelling of both ankles    Past Surgical History  Procedure Laterality Date  . Tonsilectomy/adenoidectomy with myringotomy    . Wisdom tooth extraction    . Ventral hernia repair N/A 03/28/2014    Procedure: LAPAROSCOPIC VENTRAL HERNIA REPAIR WITH MESH;  Surgeon: Velora Heckler, MD;  Location: [facility];  Service: General;  Laterality: N/A;  . Insertion of mesh N/A 03/28/2014    Procedure: INSERTION OF MESH;  Surgeon: Velora Heckler, MD;  Location: [facility];  Service:  General;  Laterality: N/A;   Current Outpatient Prescriptions on File Prior to Visit  Medication Sig Dispense Refill  . B Complex-C-Folic Acid (B-COMPLEX BALANCED PO) Take 1 tablet by mouth daily.     . calcium carbonate (OS-CAL) 600 MG TABS Take 600 mg by mouth daily.    . cetirizine (ZYRTEC) 10 MG tablet Take 10 mg by mouth daily as needed for allergies.     . cholecalciferol (VITAMIN D) 400 UNITS TABS Take 400 Units by mouth daily.    Marland Kitchen ibuprofen (ADVIL,MOTRIN) 200 MG tablet Take 400-600 mg by mouth every 8 (eight) hours as needed for moderate pain.     Marland Kitchen levothyroxine (SYNTHROID, LEVOTHROID) 75 MCG tablet Take 1 tablet (75 mcg total) by mouth daily before breakfast. 90 tablet 1  . Multiple Vitamin (MULTIVITAMIN) tablet Take 1 tablet by mouth daily.    Bertram Gala Glycol-Propyl  Glycol (SYSTANE) 0.4-0.3 % SOLN Apply 1 drop to eye 3 (three) times daily as needed (dry eyes).    . ranitidine (ZANTAC) 150 MG tablet Take 150 mg by mouth 2 (two) times daily as needed for heartburn.    . Red Yeast Rice 600 MG CAPS Take 600 mg by mouth daily.    . SUMAtriptan (IMITREX) 100 MG tablet Take 1 tablet (100 mg total) by mouth every 2 (two) hours as needed for migraine. 4 tablet 11  . telmisartan-hydrochlorothiazide (MICARDIS HCT) 80-12.5 MG per tablet Take 1 tablet by mouth daily. 90 tablet 3  . vitamin C (ASCORBIC ACID) 500 MG tablet Take 1,000 mg by mouth daily.    . Vitamin D, Ergocalciferol, (DRISDOL) 50000 UNITS CAPS capsule TAKE 1 CAPSULE (50,000 UNITS TOTAL) BY MOUTH EVERY 7 (SEVEN) DAYS. 12 capsule 0  . [DISCONTINUED] hyoscyamine (CYSTOSPAZ) 0.15 MG tablet Take 0.15 mg by mouth every 4 (four) hours as needed.     No current facility-administered medications on file prior to visit.   Allergies  Allergen Reactions  . Soap     Any soaps with fragrance cause migraines  . Sulfa Antibiotics Nausea And Vomiting   Family History  Problem Relation Age of Onset  . Colon cancer    . COPD Mother    Social History   Social History  . Marital Status: Married    Spouse Name: N/A  . Number of Children: N/A  . Years of Education: N/A   Occupational History  . 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 Smoking status: Never Smoker   . Smokeless tobacco: None  . Alcohol Use: No  . Drug Use: No  . Sexual Activity: Yes   Other Topics Concern  . None   Social History Narrative   Married. Education: [employer].       Review of Systems  Constitutional: Positive for fatigue. Negative for fever, chills, diaphoresis, activity change, appetite change and unexpected weight change.  HENT: Positive for congestion, postnasal drip and rhinorrhea. Negative for facial swelling, sinus pressure, sore throat and voice change.   Respiratory: Positive for cough, chest  tightness, shortness of breath and wheezing.   Cardiovascular: Positive for leg swelling. Negative for chest pain.  Gastrointestinal: Positive for nausea, abdominal pain and abdominal distention. Negative for vomiting.  Musculoskeletal: Positive for myalgias, back pain, joint swelling, arthralgias and gait problem.  Skin: Negative for color change.  Allergic/Immunologic: Negative for immunocompromised state.  Neurological: Positive for weakness and numbness.  Hematological: Negative for adenopathy.  Psychiatric/Behavioral: Negative for dysphoric mood.  Objective:  BP 153/85 mmHg  Pulse 97  Temp(Src) 97.6 F (36.4 C)  Resp 16  Ht  (1.6 m)  Wt 305 lb (138.347 kg)  BMI 54.04 kg/m2  Physical Exam  Constitutional: She is oriented to person, place, and time. She appears well-developed and well-nourished. No distress.  HENT:  Head: Normocephalic and atraumatic.  Right Ear: External ear normal.  Left Ear: External ear normal.  Eyes: Conjunctivae are normal. No scleral icterus.  Neck: Normal range of motion. Neck supple. No thyromegaly present.  Cardiovascular: Normal rate, regular rhythm, normal heart sounds and intact distal pulses.   Pulmonary/Chest: Effort normal. No accessory muscle usage. No respiratory distress. She has decreased breath sounds.  Musculoskeletal: She exhibits no edema.  Lymphadenopathy:    She has no cervical adenopathy.  Neurological: She is alert and oriented to person, place, and time.  Skin: Skin is warm and dry. She is not diaphoretic. No erythema.  Psychiatric: She has a normal mood and affect. Her behavior is normal.          Assessment & Plan:  Scheduled for full physical after 3 mos   1. Essential hypertension   2. Asthma, chronic, moderate persistent, with acute exacerbation -Ok to call for pred course and zpack if cough continues, hold off on flu shot until acute illness resolves  3. IBS (irritable bowel syndrome)   4.  Hypothyroidism, unspecified hypothyroidism type - tsh nml at 1.049, cont levothyroxine 112.  5. Fibromyalgia - encouraged to start water PT at [facility] as rec by ortho   6. Chronic pain   7. Encounter for medication monitoring   8. Upper airway cough syndrome   9. Prediabetes   10.    Obesity - reviewed diet options w/ pt - consider [facility] [employer] program. Pt has prior referral to nutritionist she is cons seeing but I am worried that this will not be helpful as pt is already aware of many of the poor choices she make but lacks sig motivation to change them - pt encouraged to try to identify a way to motivate herself to stick with her dietary goals. Advised to keep food log to bring with her to nutritionist so they can help her craft a diet that works with pt's own preferences and restrictions  Orders Placed This Encounter  Procedures  . TSH  . POCT CBC    Meds ordered this encounter  Medications  . amphetamine-dextroamphetamine (ADDERALL) 7.5 MG tablet    Sig: Take 1 tablet by mouth 2 (two) times daily. May fill 60 days from date written.    Dispense:  60 tablet    Refill:  0  . amphetamine-dextroamphetamine (ADDERALL) 7.5 MG tablet    Sig: Take 1 tablet by mouth 2 (two) times daily.    Dispense:  60 tablet    Refill:  0  . amphetamine-dextroamphetamine (ADDERALL) 7.5 MG tablet    Sig: Take 1 tablet by mouth 2 (two) times daily. May fill on or after 30 days from date written    Dispense:  60 tablet    Refill:  0  . pregabalin (LYRICA) 150 MG capsule    Sig: Take 1 capsule (150 mg total) by mouth 3 (three) times daily.    Dispense:  270 capsule    Refill:  1  . carisoprodol (SOMA) 350 MG tablet    Sig: Take 1 tablet (350 mg total) by mouth 4 (four) times daily as needed for muscle spasms.  Dispense:  120 tablet    Refill:  2  . montelukast (SINGULAIR) 10 MG tablet    Sig: Take 1 tablet (10 mg total) by mouth every morning.    Dispense:  90 tablet    Refill:  1  .  traMADol (ULTRAM) 50 MG tablet    Sig: Take 1 tablet (50 mg total) by mouth 4 (four) times daily.    Dispense:  120 tablet    Refill:  2  . esomeprazole (NEXIUM) 40 MG capsule    Sig: TAKE ONE CAPSULE BY MOUTH ONE TIME DAILY    Dispense:  90 capsule    Refill:  3  . citalopram (CELEXA) 10 MG tablet    Sig: Take 1 tablet (10 mg total) by mouth at bedtime.    Dispense:  90 tablet    Refill:  0  . Fluticasone-Salmeterol (ADVAIR DISKUS) 250-50 MCG/DOSE AEPB    Sig: Inhale 1 puff into the lungs 2 (two) times daily.    Dispense:  1 each    Refill:  3  . albuterol (PROVENTIL HFA) 108 (90 BASE) MCG/ACT inhaler    Sig: INHALE TWO PUFFS BY MOUTH EVERY SIX HOURS AS NEEDED    Dispense:  18 Inhaler    Refill:  11  . benzonatate (TESSALON) 200 MG capsule    Sig: Take 1 capsule (200 mg total) by mouth 3 (three) times daily as needed for cough.    Dispense:  60 capsule    Refill:  0  . chlorpheniramine-HYDROcodone (TUSSIONEX PENNKINETIC ER) 10-8 MG/5ML SUER    Sig: Take 5 mLs by mouth at bedtime as needed for cough.    Dispense:  90 mL    Refill:  0    Norberto SorensonEva Guelda Batson, MD MPH

## 2015-08-21 NOTE — Patient Instructions (Signed)
Check out [facility] new weight loss program on [address] in [place].  Yes - start PT at [facility] for water exercises. Start the recumbant bike in small bursts.  You can get your flu shot after your current cough resolves.  I'd be happy to send you to a nutritionist but come with a food diary/log - think about alternative thinks that might help keep you accountable or a way to reward yourself for start walks or good diet choices.  Diet for Irritable Bowel Syndrome When you have irritable bowel syndrome (IBS), the foods you eat and your eating habits are very important. IBS may cause various symptoms, such as abdominal pain, constipation, or diarrhea. Choosing the right foods can help ease discomfort caused by these symptoms. Work with your health care provider and dietitian to find the best eating plan to help control your symptoms. WHAT GENERAL GUIDELINES DO I NEED TO FOLLOW?  Keep a food diary. This will help you identify foods that cause symptoms. Write down:  What you eat and when.  What symptoms you have.  When symptoms occur in relation to your meals.  Avoid foods that cause symptoms. Talk with your dietitian about other ways to get the same nutrients that are in these foods.  Eat more foods that contain fiber. Take a fiber supplement if directed by your dietitian.  Eat your meals slowly, in a relaxed setting.  Aim to eat 5-6 small meals per day. Do not skip meals.  Drink enough fluids to keep your urine clear or pale yellow.  Ask your health care provider if you should take an over-the-counter probiotic during flare-ups to help restore healthy gut bacteria.  If you have cramping or diarrhea, try making your meals low in fat and high in carbohydrates. Examples of carbohydrates are pasta, rice, whole grain breads and cereals, fruits, and vegetables.  If dairy products cause your symptoms to flare up, try eating less of them. You might be able to handle yogurt  better than other dairy products because it contains bacteria that help with digestion. WHAT FOODS ARE NOT RECOMMENDED? The following are some foods and drinks that may worsen your symptoms:  Fatty foods, such as [place] fries.  Milk products, such as cheese or ice cream.  Chocolate.  Alcohol.  Products with caffeine, such as coffee.  Carbonated drinks, such as soda. The items listed above may not be a complete list of foods and beverages to avoid. Contact your dietitian for more information. WHAT FOODS ARE GOOD SOURCES OF FIBER? Your health care provider or dietitian may recommend that you eat more foods that contain fiber. Fiber can help reduce constipation and other IBS symptoms. Add foods with fiber to your diet a little at a time so that your body can get used to them. Too much fiber at once might cause gas and swelling of your abdomen. The following are some foods that are good sources of fiber:  Apples.  Peaches.  Pears.  Berries.  Figs.  Broccoli (raw).  Cabbage.  Carrots.  Raw peas.  Kidney beans.  Lima beans.  Whole grain bread.  Whole grain cereal. FOR MORE INFORMATION  International Foundation for Functional Gastrointestinal Disorders: www.iffgd.[employer] of Diabetes and Digestive and Kidney Diseases: http://norris-lawson.com/.aspx   This information is not intended to replace advice given to you by your health care provider. Make sure you discuss any questions you have with your health care provider.   Document Released: 12/18/2003 Document Revised: 10/18/2014 Document Reviewed:  12/28/2013 Elsevier Interactive Patient Education [employer]2016 Elsevier Inc.

## 2015-08-26 ENCOUNTER — Other Ambulatory Visit: Payer: Self-pay | Admitting: Family Medicine

## 2015-09-02 ENCOUNTER — Other Ambulatory Visit: Payer: Self-pay | Admitting: Family Medicine

## 2015-09-03 MED ORDER — LEVOTHYROXINE SODIUM 75 MCG PO TABS: 75.0000 ug | ORAL_TABLET | Freq: Every day | ORAL | Status: DC

## 2015-09-03 NOTE — Telephone Encounter (Signed)
Dr. Clelia CroftShaw patient has an appointment in 2/16.  Can we refill Vitamin D until then.

## 2015-09-05 NOTE — Telephone Encounter (Signed)
Will not refill vit D - after 6 mo course is complete, please let pt know that I want pt to start taking about 2000u otc vitamin D daily.  We will recheck the level at the next routine visit to ensure we can maintain her on otc or see if we need to retry high dose.

## 2015-09-06 NOTE — Telephone Encounter (Signed)
Spoke with pt, advised message from Dr. Shaw. Pt understood. 

## 2015-09-10 ENCOUNTER — Other Ambulatory Visit: Payer: Self-pay | Admitting: Physician Assistant

## 2015-09-12 DIAGNOSIS — M5416 Radiculopathy, lumbar region: Secondary | ICD-10-CM

## 2015-09-27 ENCOUNTER — Other Ambulatory Visit: Payer: Self-pay | Admitting: Family Medicine

## 2015-10-06 ENCOUNTER — Other Ambulatory Visit: Payer: Self-pay | Admitting: Family Medicine

## 2015-10-13 ENCOUNTER — Other Ambulatory Visit: Payer: Self-pay | Admitting: Family Medicine

## 2015-10-18 ENCOUNTER — Other Ambulatory Visit: Payer: Self-pay | Admitting: Family Medicine

## 2015-10-20 NOTE — Telephone Encounter (Signed)
Dr Clelia CroftShaw, it seems that we keep RFing this Rx, but only 1 mos at a time. Do you want to give multiple RFs so she can stay on it, or does she need to return for re-eval?

## 2015-11-10 ENCOUNTER — Other Ambulatory Visit: Payer: Self-pay | Admitting: Family Medicine

## 2015-11-27 ENCOUNTER — Encounter: Payer: Self-pay | Admitting: Family Medicine

## 2015-12-10 NOTE — Progress Notes (Signed)
Subjective:    Patient ID: Gloria Carter, female    DOB: [birth year], 50 y.o.   MRN: [id number] Chief Complaint  Patient presents with  . Medication Refill     HPI  Here for her routine 3 mo f/u OV for refill of chronic controlled medications.  Obesity: Encouraged pt to to check out [facility] program.  Was considering seeing a nutritionist. A1c was 5.8 6 mos prior  Fibromyalgia: Encouraged pt to start water PT through [facility] as was recommend by ortho- 2 d 1 day mental. Has not done accupunture in a year, has tourble walking up [place]. Is using cane for short distance, can lean on cart, uses rolling walker  Hypothyroid: levothyroxine dose at [facility] at 75 4 mos prior.  Vitamin D def: was 19 at last visit 6 mos prior  HTN: check BP at home.   Asthma: on zyrtec and singulair.  Dr. Waldo Laine -  12/2  Past Medical History  Diagnosis Date  . Asthma   . Dysphagia   . Fibromyalgia   . Migraine   . GERD (gastroesophageal reflux disease)   . ADD (attention deficit disorder)   . Hernia of abdominal wall   . Allergy     ENVIRONMENTAL  . Hypertension   . Thyroid disease   . Fibromyalgia   . Numbness and tingling in hands   . Arthritis     knees  . IBS (irritable bowel syndrome)   . Difficulty sleeping   . Depression   . Swelling of both ankles    Past Surgical History  Procedure Laterality Date  . Tonsilectomy/adenoidectomy with myringotomy    . Wisdom tooth extraction    . Ventral hernia repair N/A 03/28/2014    Procedure: LAPAROSCOPIC VENTRAL HERNIA REPAIR WITH MESH;  Surgeon: Velora Heckler, MD;  Location: [facility];  Service: General;  Laterality: N/A;  . Insertion of mesh N/A 03/28/2014    Procedure: INSERTION OF MESH;  Surgeon: Velora Heckler, MD;  Location: [facility];  Service: General;  Laterality: N/A;   Current Outpatient Prescriptions on File Prior to Visit  Medication Sig Dispense Refill  . albuterol (PROVENTIL HFA) 108 (90 BASE)  MCG/ACT inhaler INHALE TWO PUFFS BY MOUTH EVERY SIX HOURS AS NEEDED 18 Inhaler 11  . azelastine (OPTIVAR) 0.05 % ophthalmic solution PLACE TWO DRIPS INTO BOTH EYES TWICE DAILY 6 mL 5  . B Complex-C-Folic Acid (B-COMPLEX BALANCED PO) Take 1 tablet by mouth daily.     . calcium carbonate (OS-CAL) 600 MG TABS Take 600 mg by mouth daily.    . cetirizine (ZYRTEC) 10 MG tablet Take 10 mg by mouth daily as needed for allergies.     . cholecalciferol (VITAMIN D) 400 UNITS TABS Take 400 Units by mouth daily.    Marland Kitchen esomeprazole (NEXIUM) 40 MG capsule TAKE ONE CAPSULE BY MOUTH ONE TIME DAILY 90 capsule 3  . esomeprazole (NEXIUM) 40 MG capsule TAKE ONE CAPSULE BY MOUTH ONE TIME DAILY*MAX D/S 30** 90 capsule 0  . ibuprofen (ADVIL,MOTRIN) 200 MG tablet Take 400-600 mg by mouth every 8 (eight) hours as needed for moderate pain.     . montelukast (SINGULAIR) 10 MG tablet Take 1 tablet (10 mg total) by mouth every morning. 90 tablet 1  . Multiple Vitamin (MULTIVITAMIN) tablet Take 1 tablet by mouth daily.    Bertram Gala Glycol-Propyl Glycol (SYSTANE) 0.4-0.3 % SOLN Apply 1 drop to eye 3 (three) times daily as needed (dry eyes).    Marland Kitchen  ranitidine (ZANTAC) 150 MG tablet Take 150 mg by mouth 2 (two) times daily as needed for heartburn.    . Red Yeast Rice 600 MG CAPS Take 600 mg by mouth daily.    . SUMAtriptan (IMITREX) 100 MG tablet Take 1 tablet (100 mg total) by mouth every 2 (two) hours as needed for migraine. 4 tablet 11  . telmisartan-hydrochlorothiazide (MICARDIS HCT) 80-12.5 MG per tablet Take 1 tablet by mouth daily. 90 tablet 3  . vitamin C (ASCORBIC ACID) 500 MG tablet Take 1,000 mg by mouth daily.    . [DISCONTINUED] hyoscyamine (CYSTOSPAZ) 0.15 MG tablet Take 0.15 mg by mouth every 4 (four) hours as needed.     No current facility-administered medications on file prior to visit.   Allergies  Allergen Reactions  . Soap     Any soaps with fragrance cause migraines  . Sulfa Antibiotics Nausea And  Vomiting   Family History  Problem Relation Age of Onset  . Colon cancer    . COPD Mother    Social History   Social History  . Marital Status: Married    Spouse Name: N/A  . Number of Children: N/A  . Years of Education: N/A   Occupational History  . 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 Smoking status: Never Smoker   . Smokeless tobacco: Not on file  . Alcohol Use: No  . Drug Use: No  . Sexual Activity: Yes   Other Topics Concern  . Not on file   Social History Narrative   Married. Education: [employer].      Review of Systems  Constitutional: Negative for fever, chills, activity change, appetite change and unexpected weight change.  HENT: Positive for congestion, postnasal drip and rhinorrhea.   Respiratory: Negative for cough, chest tightness, shortness of breath and wheezing.   Cardiovascular: Positive for leg swelling. Negative for chest pain and palpitations.  Musculoskeletal: Positive for myalgias, back pain, joint swelling, arthralgias and gait problem.  Skin: Negative for color change and rash.  Allergic/Immunologic: Negative for immunocompromised state.  Neurological: Positive for weakness. Negative for tremors, syncope and numbness.  Psychiatric/Behavioral: Positive for sleep disturbance.       Objective:  BP 136/85 mmHg  Pulse 97  Temp(Src) 97.5 F (36.4 C)  Resp 16  Ht 5\' 3"  (1.6 m)  Wt 303 lb (137.44 kg)  BMI 53.69 kg/m2  Physical Exam  Constitutional: She is oriented to person, place, and time. She appears well-developed and well-nourished. No distress.  HENT:  Head: Normocephalic and atraumatic.  Right Ear: External ear normal.  Left Ear: External ear normal.  Eyes: Conjunctivae are normal. No scleral icterus.  Neck: Normal range of motion. Neck supple. No thyromegaly present.  Cardiovascular: Normal rate, regular rhythm, normal heart sounds and intact distal pulses.   Pulmonary/Chest: Effort normal and breath  sounds normal. No respiratory distress.  Musculoskeletal: She exhibits no edema.  Lymphadenopathy:    She has no cervical adenopathy.  Neurological: She is alert and oriented to person, place, and time.  Skin: Skin is warm and dry. She is not diaphoretic. No erythema.  Psychiatric: She has a normal mood and affect. Her behavior is normal.          Assessment & Plan:  Needs to sched for CPE Needs a new referrals to nutrition but [facility] doesn't take her insurance.  1. Essential hypertension   2. Asthma, chronic, moderate persistent, uncomplicated   3. IBS (irritable bowel  syndrome)   4. Hypothyroidism, unspecified hypothyroidism type   5. Fibromyalgia   6. Degeneration of lumbar or lumbosacral intervertebral disc   7. Chronic pain   8. ADD (attention deficit disorder)   9. BMI 45.0-49.9, adult (HCC)   10. Hyperlipidemia   11. Encounter for medication monitoring   12. Vitamin D deficiency   13. Obesity, morbid, BMI 50 or higher (HCC)   14. Prediabetes     Meds ordered this encounter  Medications  . carisoprodol (SOMA) 350 MG tablet    Sig: Take 1 tablet (350 mg total) by mouth 4 (four) times daily as needed for muscle spasms.    Dispense:  120 tablet    Refill:  2  . amphetamine-dextroamphetamine (ADDERALL) 10 MG tablet    Sig: Take 1 tablet (10 mg total) by mouth 2 (two) times daily with a meal.    Dispense:  60 tablet    Refill:  0    May fill 56 days after date written  . amphetamine-dextroamphetamine (ADDERALL) 10 MG tablet    Sig: Take 1 tablet (10 mg total) by mouth 2 (two) times daily.    Dispense:  60 tablet    Refill:  0    May fill 28 days from date written  . amphetamine-dextroamphetamine (ADDERALL) 10 MG tablet    Sig: Take 1 tablet (10 mg total) by mouth 2 (two) times daily.    Dispense:  60 tablet    Refill:  0  . levothyroxine (SYNTHROID, LEVOTHROID) 75 MCG tablet    Sig: Take 1 tablet (75 mcg total) by mouth daily before breakfast.     Dispense:  90 tablet    Refill:  1  . pregabalin (LYRICA) 150 MG capsule    Sig: Take 1 capsule (150 mg total) by mouth 3 (three) times daily.    Dispense:  270 capsule    Refill:  1  . traMADol (ULTRAM) 50 MG tablet    Sig: Take 1 tablet (50 mg total) by mouth 4 (four) times daily.    Dispense:  120 tablet    Refill:  2     Norberto Sorenson, MD MPH

## 2015-12-11 ENCOUNTER — Ambulatory Visit (INDEPENDENT_AMBULATORY_CARE_PROVIDER_SITE_OTHER): Payer: 59 | Admitting: Family Medicine

## 2015-12-11 VITALS — BP 136/85 | HR 97 | Temp 97.5°F | Resp 16 | Ht 63.0 in | Wt 303.0 lb

## 2015-12-11 DIAGNOSIS — M797 Fibromyalgia: Secondary | ICD-10-CM

## 2015-12-11 DIAGNOSIS — E559 Vitamin D deficiency, unspecified: Secondary | ICD-10-CM

## 2015-12-11 DIAGNOSIS — F988 Other specified behavioral and emotional disorders with onset usually occurring in childhood and adolescence: Secondary | ICD-10-CM

## 2015-12-11 DIAGNOSIS — Z5181 Encounter for therapeutic drug level monitoring: Secondary | ICD-10-CM

## 2015-12-11 DIAGNOSIS — R7303 Prediabetes: Secondary | ICD-10-CM

## 2015-12-11 DIAGNOSIS — Z6841 Body Mass Index (BMI) 40.0 and over, adult: Secondary | ICD-10-CM | POA: Diagnosis not present

## 2015-12-11 DIAGNOSIS — M5137 Other intervertebral disc degeneration, lumbosacral region: Secondary | ICD-10-CM | POA: Diagnosis not present

## 2015-12-11 DIAGNOSIS — F909 Attention-deficit hyperactivity disorder, unspecified type: Secondary | ICD-10-CM

## 2015-12-11 DIAGNOSIS — G8929 Other chronic pain: Secondary | ICD-10-CM

## 2015-12-11 DIAGNOSIS — J4540 Moderate persistent asthma, uncomplicated: Secondary | ICD-10-CM | POA: Diagnosis not present

## 2015-12-11 DIAGNOSIS — K589 Irritable bowel syndrome without diarrhea: Secondary | ICD-10-CM | POA: Diagnosis not present

## 2015-12-11 DIAGNOSIS — E785 Hyperlipidemia, unspecified: Secondary | ICD-10-CM | POA: Diagnosis not present

## 2015-12-11 DIAGNOSIS — I10 Essential (primary) hypertension: Secondary | ICD-10-CM | POA: Diagnosis not present

## 2015-12-11 DIAGNOSIS — E039 Hypothyroidism, unspecified: Secondary | ICD-10-CM

## 2015-12-11 MED ORDER — CARISOPRODOL 350 MG PO TABS: 350.0000 mg | ORAL_TABLET | Freq: Four times a day (QID) | ORAL | Status: DC | PRN

## 2015-12-11 MED ORDER — TRAMADOL HCL 50 MG PO TABS: 50.0000 mg | ORAL_TABLET | Freq: Four times a day (QID) | ORAL | Status: DC

## 2015-12-11 MED ORDER — PREGABALIN 150 MG PO CAPS: 150.0000 mg | ORAL_CAPSULE | Freq: Three times a day (TID) | ORAL | Status: DC

## 2015-12-11 MED ORDER — LEVOTHYROXINE SODIUM 75 MCG PO TABS: 75.0000 ug | ORAL_TABLET | Freq: Every day | ORAL | Status: DC

## 2015-12-11 MED ORDER — AMPHETAMINE-DEXTROAMPHETAMINE 10 MG PO TABS: 10.0000 mg | ORAL_TABLET | Freq: Two times a day (BID) | ORAL | Status: DC

## 2016-01-06 ENCOUNTER — Other Ambulatory Visit: Payer: Self-pay | Admitting: Family Medicine

## 2016-01-07 ENCOUNTER — Other Ambulatory Visit: Payer: Self-pay | Admitting: Family Medicine

## 2016-02-03 ENCOUNTER — Emergency Department (HOSPITAL_COMMUNITY): Payer: 59

## 2016-02-03 ENCOUNTER — Emergency Department (HOSPITAL_COMMUNITY)
Admission: EM | Admit: 2016-02-03 | Discharge: 2016-02-03 | Disposition: A | Discharge status: Home / Self Care | Payer: 59 | Attending: Emergency Medicine | Admitting: Emergency Medicine

## 2016-02-03 ENCOUNTER — Encounter (HOSPITAL_COMMUNITY): Payer: Self-pay | Admitting: *Deleted

## 2016-02-03 DIAGNOSIS — M170 Bilateral primary osteoarthritis of knee: Secondary | ICD-10-CM | POA: Diagnosis not present

## 2016-02-03 DIAGNOSIS — R1084 Generalized abdominal pain: Secondary | ICD-10-CM | POA: Diagnosis not present

## 2016-02-03 DIAGNOSIS — E079 Disorder of thyroid, unspecified: Secondary | ICD-10-CM | POA: Diagnosis not present

## 2016-02-03 DIAGNOSIS — F909 Attention-deficit hyperactivity disorder, unspecified type: Secondary | ICD-10-CM | POA: Insufficient documentation

## 2016-02-03 DIAGNOSIS — Z79899 Other long term (current) drug therapy: Secondary | ICD-10-CM | POA: Diagnosis not present

## 2016-02-03 DIAGNOSIS — M797 Fibromyalgia: Secondary | ICD-10-CM | POA: Insufficient documentation

## 2016-02-03 DIAGNOSIS — K219 Gastro-esophageal reflux disease without esophagitis: Secondary | ICD-10-CM | POA: Insufficient documentation

## 2016-02-03 DIAGNOSIS — E669 Obesity, unspecified: Secondary | ICD-10-CM | POA: Diagnosis not present

## 2016-02-03 DIAGNOSIS — F329 Major depressive disorder, single episode, unspecified: Secondary | ICD-10-CM | POA: Diagnosis not present

## 2016-02-03 DIAGNOSIS — J45909 Unspecified asthma, uncomplicated: Secondary | ICD-10-CM | POA: Diagnosis not present

## 2016-02-03 DIAGNOSIS — G43909 Migraine, unspecified, not intractable, without status migrainosus: Secondary | ICD-10-CM | POA: Insufficient documentation

## 2016-02-03 DIAGNOSIS — I10 Essential (primary) hypertension: Secondary | ICD-10-CM | POA: Insufficient documentation

## 2016-02-03 DIAGNOSIS — R1010 Upper abdominal pain, unspecified: Secondary | ICD-10-CM | POA: Diagnosis present

## 2016-02-03 LAB — COMPREHENSIVE METABOLIC PANEL
ALT: 28 U/L (ref 14–54)
ANION GAP: 12 (ref 5–15)
AST: 26 U/L (ref 15–41)
Albumin: 4.3 g/dL (ref 3.5–5.0)
Alkaline Phosphatase: 109 U/L (ref 38–126)
BUN: 8 mg/dL (ref 6–20)
CALCIUM: 9.6 mg/dL (ref 8.9–10.3)
CHLORIDE: 103 mmol/L (ref 101–111)
CO2: 27 mmol/L (ref 22–32)
Creatinine, Ser: 0.63 mg/dL (ref 0.44–1.00)
GFR calc non Af Amer: >60 mL/min (ref >60)
Glucose, Bld: 118 mg/dL — ABNORMAL HIGH (ref 65–99)
Potassium: 3.6 mmol/L (ref 3.5–5.1)
SODIUM: 142 mmol/L (ref 135–145)
Total Bilirubin: 1 mg/dL (ref 0.3–1.2)
Total Protein: 8.2 g/dL — ABNORMAL HIGH (ref 6.5–8.1)

## 2016-02-03 LAB — URINALYSIS, ROUTINE W REFLEX MICROSCOPIC
Bilirubin Urine: NEGATIVE
Glucose, UA: NEGATIVE mg/dL
Ketones, ur: NEGATIVE mg/dL
Nitrite: NEGATIVE
Protein, ur: NEGATIVE mg/dL
SPECIFIC GRAVITY, URINE: 1.005 (ref 1.005–1.030)
pH: 6.5 (ref 5.0–8.0)

## 2016-02-03 LAB — URINE MICROSCOPIC-ADD ON

## 2016-02-03 LAB — CBC
HCT: 40.2 % (ref 36.0–46.0)
HEMOGLOBIN: 13.9 g/dL (ref 12.0–15.0)
MCH: 29.6 pg (ref 26.0–34.0)
MCHC: 34.6 g/dL (ref 30.0–36.0)
MCV: 85.5 fL (ref 78.0–100.0)
Platelets: 216 10*3/uL (ref 150–400)
RBC: 4.7 MIL/uL (ref 3.87–5.11)
RDW: 13.2 % (ref 11.5–15.5)
WBC: 10.3 10*3/uL (ref 4.0–10.5)

## 2016-02-03 LAB — LIPASE, BLOOD: LIPASE: 22 U/L (ref 11–51)

## 2016-02-03 MED ORDER — DICYCLOMINE HCL 20 MG PO TABS: 20.0000 mg | ORAL_TABLET | Freq: Two times a day (BID) | ORAL | Status: DC

## 2016-02-03 MED ORDER — DIATRIZOATE MEGLUMINE & SODIUM 66-10 % PO SOLN
15.0000 mL | Freq: Once | ORAL | Status: AC
  Administered 2016-02-03: 15 mL via ORAL

## 2016-02-03 MED ORDER — SODIUM CHLORIDE 0.9 % IV BOLUS (SEPSIS)
1000.0000 mL | Freq: Once | INTRAVENOUS | Status: AC
  Administered 2016-02-03: 1000 mL via INTRAVENOUS

## 2016-02-03 MED ORDER — MORPHINE SULFATE (PF) 4 MG/ML IV SOLN
4.0000 mg | Freq: Once | INTRAVENOUS | Status: DC
  Filled 2016-02-03: qty 1

## 2016-02-03 MED ORDER — PANTOPRAZOLE SODIUM 20 MG PO TBEC: 20.0000 mg | DELAYED_RELEASE_TABLET | Freq: Every day | ORAL | Status: DC

## 2016-02-03 MED ORDER — IOPAMIDOL (ISOVUE-300) INJECTION 61%
100.0000 mL | Freq: Once | INTRAVENOUS | Status: AC | PRN
  Administered 2016-02-03: 100 mL via INTRAVENOUS

## 2016-02-03 MED ORDER — ONDANSETRON HCL 4 MG PO TABS: 4.0000 mg | ORAL_TABLET | Freq: Four times a day (QID) | ORAL | Status: DC

## 2016-02-03 MED ORDER — METOCLOPRAMIDE HCL 5 MG/ML IJ SOLN
10.0000 mg | Freq: Once | INTRAMUSCULAR | Status: AC
  Administered 2016-02-03: 10 mg via INTRAVENOUS
  Filled 2016-02-03: qty 2

## 2016-02-03 MED ORDER — PANTOPRAZOLE SODIUM 40 MG IV SOLR
40.0000 mg | Freq: Once | INTRAVENOUS | Status: AC
  Administered 2016-02-03: 40 mg via INTRAVENOUS
  Filled 2016-02-03: qty 40

## 2016-02-03 NOTE — ED Notes (Signed)
Patient transported to CT 

## 2016-02-03 NOTE — ED Provider Notes (Signed)
CSN: [id number]     Arrival date & time 02/03/16  1500 History   First MD Initiated Contact with Patient 02/03/16 1827     Chief Complaint  Patient presents with  . Abdominal Pain   HPI   Gloria Carter is a 50 y.o. female PMH significant for hernia (s/p 10 inch mesh), GERD, presenting with upper abdominal pain since Sunday. She felt she was constipated, took a Miralax, and although this helped her produce a BM, this did not alleviate her pain. She describes her pain as aching, non-radiating, 8/10 pain scale, intermittent, exacerbated with eating and certain movements, not associated with BM. She denies fevers, chills, CP, SOB, N/V/D, hematemesis, hematochezia, NSAID or ETOH use.   Past Medical History  Diagnosis Date  . Asthma   . Dysphagia   . Fibromyalgia   . Migraine   . GERD (gastroesophageal reflux disease)   . ADD (attention deficit disorder)   . Hernia of abdominal wall   . Allergy     ENVIRONMENTAL  . Hypertension   . Thyroid disease   . Fibromyalgia   . Numbness and tingling in hands   . Arthritis     knees  . IBS (irritable bowel syndrome)   . Difficulty sleeping   . Depression   . Swelling of both ankles    Past Surgical History  Procedure Laterality Date  . Tonsilectomy/adenoidectomy with myringotomy    . Wisdom tooth extraction    . Ventral hernia repair N/A 03/28/2014    Procedure: LAPAROSCOPIC VENTRAL HERNIA REPAIR WITH MESH;  Surgeon: Velora Heckler, MD;  Location: [facility];  Service: General;  Laterality: N/A;  . Insertion of mesh N/A 03/28/2014    Procedure: INSERTION OF MESH;  Surgeon: Velora Heckler, MD;  Location: [facility];  Service: General;  Laterality: N/A;   Family History  Problem Relation Age of Onset  . Colon cancer    . COPD Mother    Social History  Substance Use Topics  . Smoking status: Never Smoker   . Smokeless tobacco: None  . Alcohol Use: No   OB History    No data available     Review of Systems  Ten systems are reviewed and  are negative for acute change except as noted in the HPI  Allergies  Soap and Sulfa antibiotics  Home Medications   Prior to Admission medications   Medication Sig Start Date End Date Taking? Authorizing Provider  ADVAIR DISKUS 250-50 MCG/DOSE AEPB INHALE 1 PUFF INTO THE LUNGS 2 (TWO) TIMES DAILY. 01/06/16  Yes Sherren Mocha, MD  albuterol (PROVENTIL HFA) 108 (90 BASE) MCG/ACT inhaler INHALE TWO PUFFS BY MOUTH EVERY SIX HOURS AS NEEDED Patient taking differently: Inhale 2 puffs into the lungs every 6 (six) hours as needed for wheezing or shortness of breath. INHALE TWO PUFFS BY MOUTH EVERY SIX HOURS AS NEEDED 08/21/15  Yes Sherren Mocha, MD  amphetamine-dextroamphetamine (ADDERALL) 10 MG tablet Take 1 tablet (10 mg total) by mouth 2 (two) times daily with a meal. 12/11/15  Yes Sherren Mocha, MD  azelastine (OPTIVAR) 0.05 % ophthalmic solution PLACE TWO DRIPS INTO BOTH EYES TWICE DAILY 10/21/15  Yes Sherren Mocha, MD  B Complex-C-Folic Acid (B-COMPLEX BALANCED PO) Take 1 tablet by mouth daily.    Yes Historical Provider, MD  calcium carbonate (OS-CAL) 600 MG TABS Take 600 mg by mouth daily.   Yes Historical Provider, MD  carisoprodol (SOMA) 350 MG tablet Take 1 tablet (350  mg total) by mouth 4 (four) times daily as needed for muscle spasms. 12/11/15  Yes Sherren Mocha, MD  cholecalciferol (VITAMIN D) 400 UNITS TABS Take 400 Units by mouth daily.   Yes Historical Provider, MD  citalopram (CELEXA) 10 MG tablet TAKE 1 TABLET (10 MG TOTAL) BY MOUTH AT BEDTIME 01/08/16  Yes Sherren Mocha, MD  esomeprazole (NEXIUM) 40 MG capsule TAKE ONE CAPSULE BY MOUTH ONE TIME DAILY 08/21/15  Yes Sherren Mocha, MD  ibuprofen (ADVIL,MOTRIN) 200 MG tablet Take 400-600 mg by mouth every 8 (eight) hours as needed for moderate pain.    Yes Historical Provider, MD  levothyroxine (SYNTHROID, LEVOTHROID) 75 MCG tablet Take 1 tablet (75 mcg total) by mouth daily before breakfast. 12/11/15  Yes Sherren Mocha, MD  montelukast (SINGULAIR) 10 MG tablet Take 1  tablet (10 mg total) by mouth every morning. 08/21/15  Yes Sherren Mocha, MD  Multiple Vitamin (MULTIVITAMIN) tablet Take 1 tablet by mouth daily.   Yes Historical Provider, MD  polyethylene glycol (MIRALAX / GLYCOLAX) packet Take 17 g by mouth daily as needed for mild constipation.   Yes Historical Provider, MD  pregabalin (LYRICA) 150 MG capsule Take 1 capsule (150 mg total) by mouth 3 (three) times daily. 12/11/15  Yes Sherren Mocha, MD  ranitidine (ZANTAC) 150 MG tablet Take 150 mg by mouth 2 (two) times daily as needed for heartburn.   Yes Historical Provider, MD  Red Yeast Rice 600 MG CAPS Take 600 mg by mouth daily.   Yes Historical Provider, MD  telmisartan-hydrochlorothiazide (MICARDIS HCT) 80-12.5 MG per tablet Take 1 tablet by mouth daily. 02/28/15  Yes Sherren Mocha, MD  traMADol (ULTRAM) 50 MG tablet Take 1 tablet (50 mg total) by mouth 4 (four) times daily. 12/11/15  Yes Sherren Mocha, MD  vitamin C (ASCORBIC ACID) 500 MG tablet Take 1,000 mg by mouth daily.   Yes Historical Provider, MD  amphetamine-dextroamphetamine (ADDERALL) 10 MG tablet Take 1 tablet (10 mg total) by mouth 2 (two) times daily. Patient not taking: Reported on 02/03/2016 12/11/15   Sherren Mocha, MD  amphetamine-dextroamphetamine (ADDERALL) 10 MG tablet Take 1 tablet (10 mg total) by mouth 2 (two) times daily. Patient not taking: Reported on 02/03/2016 12/11/15   Sherren Mocha, MD  cetirizine (ZYRTEC) 10 MG tablet Take 10 mg by mouth daily as needed for allergies.     Historical Provider, MD  esomeprazole (NEXIUM) 40 MG capsule TAKE ONE CAPSULE BY MOUTH ONE TIME DAILY*MAX D/S 30** Patient not taking: Reported on 02/03/2016 09/12/15   Sherren Mocha, MD  meloxicam (MOBIC) 15 MG tablet TAKE 1 TABLET EVERY MORNING WITH FOOD AS NEEDED PAIN/INFLAMMATION. 01/16/16   Historical Provider, MD  Polyethyl Glycol-Propyl Glycol (SYSTANE) 0.4-0.3 % SOLN Apply 1 drop to eye 3 (three) times daily as needed (dry eyes).    Historical Provider, MD  SUMAtriptan (IMITREX)  100 MG tablet Take 1 tablet (100 mg total) by mouth every 2 (two) hours as needed for migraine. 02/28/15   Sherren Mocha, MD   BP 171/114 mmHg  Pulse 127  Temp(Src) 98.2 F (36.8 C) (Oral)  Resp 20  Ht 5' 3.5" (1.613 m)  Wt 138.347 kg  BMI 53.17 kg/m2  SpO2 99% Physical Exam  Constitutional: She appears well-developed and well-nourished. No distress.  Obese  HENT:  Head: Normocephalic and atraumatic.  Mouth/Throat: Oropharynx is clear and moist. No oropharyngeal exudate.  Eyes: Conjunctivae are normal. Right eye  exhibits no discharge. Left eye exhibits no discharge. No scleral icterus.  Neck: No tracheal deviation present.  Cardiovascular: Normal rate, regular rhythm, normal heart sounds and intact distal pulses.  Exam reveals no gallop and no friction rub.   No murmur heard. Pulmonary/Chest: Effort normal and breath sounds normal. No respiratory distress. She has no wheezes. She has no rales. She exhibits no tenderness.  Abdominal: Soft. Bowel sounds are normal. She exhibits no distension and no mass. There is tenderness. There is no rebound and no guarding.  Epigastric tenderness. Negative Murphy's.   Musculoskeletal: She exhibits no edema.  Lymphadenopathy:    She has no cervical adenopathy.  Neurological: She is alert. Coordination normal.  Skin: Skin is warm and dry. No rash noted. She is not diaphoretic. No erythema.  Psychiatric: She has a normal mood and affect. Her behavior is normal.  Nursing note and vitals reviewed.   ED Course  Procedures  Labs Review Labs Reviewed  COMPREHENSIVE METABOLIC PANEL - Abnormal; Notable for the following:    Glucose, Bld 118 (*)    Total Protein 8.2 (*)    All other components within normal limits  URINALYSIS, ROUTINE W REFLEX MICROSCOPIC (NOT [facility]) - Abnormal; Notable for the following:    Hgb urine dipstick SMALL (*)    Leukocytes, UA TRACE (*)    All other components within normal limits  URINE MICROSCOPIC-ADD ON - Abnormal;  Notable for the following:    Squamous Epithelial / LPF 0-5 (*)    Bacteria, UA RARE (*)    All other components within normal limits  LIPASE, BLOOD  CBC    Imaging Review  Ct Abdomen Pelvis W Contrast  02/03/2016  CLINICAL DATA:  Upper abdominal pain for 2 days, initial encounter EXAM: CT ABDOMEN AND PELVIS WITH CONTRAST TECHNIQUE: Multidetector CT imaging of the abdomen and pelvis was performed using the standard protocol following bolus administration of intravenous contrast. CONTRAST:  100mL ISOVUE-300 IOPAMIDOL (ISOVUE-300) INJECTION 61% COMPARISON:  None. FINDINGS: Lung bases are free of acute infiltrate or sizable effusion. No parenchymal nodules are seen. The liver is diffusely fatty infiltrated. The gallbladder is well distended. The spleen, adrenal glands and kidneys are within normal limits with the exception of a right renal cyst. There are inflammatory changes surrounding the head and uncinate process of the pancreas consistent with acute pancreatitis. There is also noted on the delayed images focal areas of decreased attenuation. This is likely related to the pancreatitis although followup examination following resolution of the current clinical situation is recommended to exclude underlying mass lesion. The appendix is within normal limits. No obstructive changes are noted. The bladder is well distended. The uterus and ovaries are within normal limits. No acute bony abnormality is seen. IMPRESSION: Changes consistent with acute pancreatitis involving the head and uncinate process. No significant phlegmon is noted at this time. Hypodensity is noted in the region of the head and uncinate process likely related to the pancreatitis. Short-term followup is recommended following resolution of current clinical situation to rule out any underlying neoplasm. No other focal abnormality is seen. Electronically Signed   By: Alcide CleverMark  Lukens M.D.   On: 02/03/2016 20:48   I have personally reviewed and  evaluated these images and lab results as part of my medical decision-making.  MDM   Final diagnoses:  Generalized abdominal pain   Patient nontoxic appearing, VSS. Based on patient history and physical exam, most likely etiologies are GERD vs gallstone pancreatitis. Less likely etiologies include gastritis/PUD,  MI. UA, lipase, CMP, CBC unremarkable.  CT abdomen/pelvis demonstrates: Changes consistent with acute pancreatitis involving the head and uncinate process. No significant phlegmon is noted at this time. Hypodensity is noted in the region of the head and uncinate process likely related to the pancreatitis. Short-term followup is recommended following resolution of current clinical situation to rule out any underlying neoplasm. Patient's lipase was unremarkable. Although a normal lipase can result in pancreatitis, that is more likely in patients with chronic pancreatitis, and this is less likely in this patient given she does not drink alcohol and her pain is acute. Discussed results with patient, and advised close followup.  Patient's pain improved on IV fluids, protonix, and reglan. Tolerating PO prior to discharge.  Patient discharged with protonix, zofran, bentyl, and GI follow-up.    Melton Krebs, PA-C 02/13/16 1044  Richardean Canal, MD 02/16/16 [phone]

## 2016-02-03 NOTE — ED Notes (Signed)
Pt reports upper abd pain since Sunday.  States she thought that she was constipated, took miralax without relief.  Pt denies any n/v at this time.  Reports she had a 10-inch mesh put in 2015 d/t hernia and was found to have a stool impaction.

## 2016-02-03 NOTE — ED Notes (Signed)
Gave patient a blanket. 

## 2016-02-03 NOTE — Discharge Instructions (Signed)
Ms. Gloria GrovesBarbara Carter,  Nice meeting you! Please follow-up with gastroenterology and your primary care provider. You may need a follow-up CT based on your scan today, which showed pancreatitic inflammation, which could indicate a neoplasm. Return to the emergency department if you develop fevers, chills, increased abdominal pain, inability to keep foods down, new/worsening symptoms. Feel better soon!  S. Lane HackerNicole Aivah Putman, PA-C Abdominal Pain, Adult Many things can cause abdominal pain. Usually, abdominal pain is not caused by a disease and will improve without treatment. It can often be observed and treated at home. Your health care provider will do a physical exam and possibly order blood tests and X-rays to help determine the seriousness of your pain. However, in many cases, more time must pass before a clear cause of the pain can be found. Before that point, your health care provider may not know if you need more testing or further treatment. HOME CARE INSTRUCTIONS Monitor your abdominal pain for any changes. The following actions may help to alleviate any discomfort you are experiencing:  Only take over-the-counter or prescription medicines as directed by your health care provider.  Do not take laxatives unless directed to do so by your health care provider.  Try a clear liquid diet (broth, tea, or water) as directed by your health care provider. Slowly move to a bland diet as tolerated. SEEK MEDICAL CARE IF:  You have unexplained abdominal pain.  You have abdominal pain associated with nausea or diarrhea.  You have pain when you urinate or have a bowel movement.  You experience abdominal pain that wakes you in the night.  You have abdominal pain that is worsened or improved by eating food.  You have abdominal pain that is worsened with eating fatty foods.  You have a fever. SEEK IMMEDIATE MEDICAL CARE IF:  Your pain does not go away within 2 hours.  You keep throwing up  (vomiting).  Your pain is felt only in portions of the abdomen, such as the right side or the left lower portion of the abdomen.  You pass bloody or black tarry stools. MAKE SURE YOU:  Understand these instructions.  Will watch your condition.  Will get help right away if you are not doing well or get worse.   This information is not intended to replace advice given to you by your health care provider. Make sure you discuss any questions you have with your health care provider.   Document Released: 07/07/2005 Document Revised: 06/18/2015 Document Reviewed: 06/06/2013 Elsevier Interactive Patient Education [employer]2016 Elsevier Inc.

## 2016-02-04 ENCOUNTER — Telehealth: Payer: Self-pay | Admitting: Gastroenterology

## 2016-02-04 NOTE — Telephone Encounter (Signed)
Pt was given an appt with Dr Christella HartiganJacobs for 02/06/16

## 2016-02-06 ENCOUNTER — Ambulatory Visit (INDEPENDENT_AMBULATORY_CARE_PROVIDER_SITE_OTHER): Payer: 59 | Admitting: Gastroenterology

## 2016-02-06 ENCOUNTER — Other Ambulatory Visit (INDEPENDENT_AMBULATORY_CARE_PROVIDER_SITE_OTHER): Payer: 59

## 2016-02-06 ENCOUNTER — Encounter: Payer: Self-pay | Admitting: Gastroenterology

## 2016-02-06 VITALS — BP 140/84 | HR 84 | Ht 63.5 in | Wt 291.0 lb

## 2016-02-06 DIAGNOSIS — R938 Abnormal findings on diagnostic imaging of other specified body structures: Secondary | ICD-10-CM

## 2016-02-06 DIAGNOSIS — R9389 Abnormal findings on diagnostic imaging of other specified body structures: Secondary | ICD-10-CM

## 2016-02-06 LAB — COMPREHENSIVE METABOLIC PANEL
ALBUMIN: 4.2 g/dL (ref 3.5–5.2)
ALK PHOS: 93 U/L (ref 39–117)
ALT: 26 U/L (ref 0–35)
AST: 21 U/L (ref 0–37)
BUN: 18 mg/dL (ref 6–23)
CHLORIDE: 97 meq/L (ref 96–112)
CO2: 33 mEq/L — ABNORMAL HIGH (ref 19–32)
CREATININE: 0.68 mg/dL (ref 0.40–1.20)
Calcium: 9.7 mg/dL (ref 8.4–10.5)
GFR: 96.53 mL/min (ref >60.00)
GLUCOSE: 108 mg/dL — AB (ref 70–99)
POTASSIUM: 3.8 meq/L (ref 3.5–5.1)
SODIUM: 135 meq/L (ref 135–145)
TOTAL PROTEIN: 7.6 g/dL (ref 6.0–8.3)
Total Bilirubin: 0.6 mg/dL (ref 0.2–1.2)

## 2016-02-06 LAB — CBC WITH DIFFERENTIAL/PLATELET
BASOS ABS: 0 10*3/uL (ref 0.0–0.1)
Basophils Relative: 0.5 % (ref 0.0–3.0)
EOS ABS: 0.2 10*3/uL (ref 0.0–0.7)
Eosinophils Relative: 2.2 % (ref 0.0–5.0)
HCT: 39.3 % (ref 36.0–46.0)
Hemoglobin: 13.1 g/dL (ref 12.0–15.0)
LYMPHS ABS: 2.3 10*3/uL (ref 0.7–4.0)
Lymphocytes Relative: 23.1 % (ref 12.0–46.0)
MCHC: 33.4 g/dL (ref 30.0–36.0)
MCV: 86.5 fl (ref 78.0–100.0)
MONO ABS: 1 10*3/uL (ref 0.1–1.0)
Monocytes Relative: 9.5 % (ref 3.0–12.0)
NEUTROS ABS: 6.6 10*3/uL (ref 1.4–7.7)
NEUTROS PCT: 64.7 % (ref 43.0–77.0)
Platelets: 214 10*3/uL (ref 150.0–400.0)
RBC: 4.55 Mil/uL (ref 3.87–5.11)
RDW: 14 % (ref 11.5–15.5)
WBC: 10.1 10*3/uL (ref 4.0–10.5)

## 2016-02-06 LAB — LIPASE: Lipase: 15 U/L (ref 11.0–59.0)

## 2016-02-06 LAB — AMYLASE: Amylase: 27 U/L (ref 27–131)

## 2016-02-06 NOTE — Patient Instructions (Addendum)
You will be set up for an ultrasound to check for gallstones. You have been scheduled for an abdominal [facility]1st floor of hospital) on 02/11/16 at 830 am. Please arrive 15 minutes prior to your appointment for registration. Make certain not to have anything to eat or drink 8 hours prior to your appointment. Should you need to reschedule your appointment, please contact radiology at [phone]. This test typically takes about 30 minutes to perform. You will have labs checked today in the basement lab.  Please head down after you check out with the front desk  (cbc, cmet, lipase, amylase). Push fluids for now and relatively low fat solid foods.

## 2016-02-06 NOTE — Progress Notes (Signed)
HPI: This is a   very pleasant 50 year old woman    who was referred to me by Sherren Mocha, MD  to evaluate  abdominal pain, abnormal pancreas .    Chief complaint is abdominal pain, abnormal pancreas  Pain in epigastrium, started rather acutely.  Thought she was constipated.  No associated nausea or vomiting.  Hurt with movement.   This is new for her. Eating made it worse.  No fevers or chills.  Overall her weight has been stable over past several months.  On ultram.    Takes ibuprofen twice per week.  Walks with walker.  Morbidly obese.   Ct scan 01/2016: Changes consistent with acute pancreatitis involving the head and uncinate process. No significant phlegmon is noted at this time. Hypodensity is noted in the region of the head and uncinate process likely related to the pancreatitis. Short-term followup is recommended following resolution of current clinical situation to rule out any underlying neoplasm.  No other focal abnormality is seen.  Labs 01/2016: lipase normal, cmet, cbc normal.  Review of systems: Pertinent positive and negative review of systems were noted in the above HPI section. Complete review of systems was performed and was otherwise normal.   Past Medical History  Diagnosis Date  . Asthma   . Dysphagia   . Fibromyalgia   . Migraine   . GERD (gastroesophageal reflux disease)   . ADD (attention deficit disorder)   . Hernia of abdominal wall   . Allergy     ENVIRONMENTAL  . Hypertension   . Thyroid disease   . Fibromyalgia   . Numbness and tingling in hands   . Arthritis     knees  . IBS (irritable bowel syndrome)   . Difficulty sleeping   . Depression   . Swelling of both ankles   . Hyperlipidemia   . Obesity     Past Surgical History  Procedure Laterality Date  . Tonsilectomy/adenoidectomy with myringotomy    . Wisdom tooth extraction    . Ventral hernia repair N/A 03/28/2014    Procedure: LAPAROSCOPIC VENTRAL HERNIA REPAIR WITH MESH;   Surgeon: Velora Heckler, MD;  Location: [facility];  Service: General;  Laterality: N/A;  . Insertion of mesh N/A 03/28/2014    Procedure: INSERTION OF MESH;  Surgeon: Velora Heckler, MD;  Location: [facility];  Service: General;  Laterality: N/A;    Current Outpatient Prescriptions  Medication Sig Dispense Refill  . ADVAIR DISKUS 250-50 MCG/DOSE AEPB INHALE 1 PUFF INTO THE LUNGS 2 (TWO) TIMES DAILY. 60 each 3  . albuterol (PROVENTIL HFA) 108 (90 BASE) MCG/ACT inhaler INHALE TWO PUFFS BY MOUTH EVERY SIX HOURS AS NEEDED (Patient taking differently: Inhale 2 puffs into the lungs every 6 (six) hours as needed for wheezing or shortness of breath. INHALE TWO PUFFS BY MOUTH EVERY SIX HOURS AS NEEDED) 18 Inhaler 11  . amphetamine-dextroamphetamine (ADDERALL) 10 MG tablet Take 1 tablet (10 mg total) by mouth 2 (two) times daily with a meal. 60 tablet 0  . azelastine (OPTIVAR) 0.05 % ophthalmic solution PLACE TWO DRIPS INTO BOTH EYES TWICE DAILY 6 mL 5  . B Complex-C-Folic Acid (B-COMPLEX BALANCED PO) Take 1 tablet by mouth daily.     . calcium carbonate (OS-CAL) 600 MG TABS Take 600 mg by mouth daily.    . carisoprodol (SOMA) 350 MG tablet Take 1 tablet (350 mg total) by mouth 4 (four) times daily as needed for muscle spasms. 120 tablet 2  .  cetirizine (ZYRTEC) 10 MG tablet Take 10 mg by mouth daily as needed for allergies.     . cholecalciferol (VITAMIN D) 400 UNITS TABS Take 400 Units by mouth daily.    . citalopram (CELEXA) 10 MG tablet TAKE 1 TABLET (10 MG TOTAL) BY MOUTH AT BEDTIME 90 tablet 1  . dicyclomine (BENTYL) 20 MG tablet Take 1 tablet (20 mg total) by mouth 2 (two) times daily. 20 tablet 0  . esomeprazole (NEXIUM) 40 MG capsule TAKE ONE CAPSULE BY MOUTH ONE TIME DAILY 90 capsule 3  . ibuprofen (ADVIL,MOTRIN) 200 MG tablet Take 400-600 mg by mouth every 8 (eight) hours as needed for moderate pain.     Marland Kitchen levothyroxine (SYNTHROID, LEVOTHROID) 75 MCG tablet Take 1 tablet (75 mcg total) by mouth daily  before breakfast. 90 tablet 1  . meloxicam (MOBIC) 15 MG tablet TAKE 1 TABLET EVERY MORNING WITH FOOD AS NEEDED PAIN/INFLAMMATION.  1  . montelukast (SINGULAIR) 10 MG tablet Take 1 tablet (10 mg total) by mouth every morning. 90 tablet 1  . Multiple Vitamin (MULTIVITAMIN) tablet Take 1 tablet by mouth daily.    . ondansetron (ZOFRAN) 4 MG tablet Take 1 tablet (4 mg total) by mouth every 6 (six) hours. 12 tablet 0  . pantoprazole (PROTONIX) 20 MG tablet Take 1 tablet (20 mg total) by mouth daily. 30 tablet 0  . Polyethyl Glycol-Propyl Glycol (SYSTANE) 0.4-0.3 % SOLN Apply 1 drop to eye 3 (three) times daily as needed (dry eyes).    . polyethylene glycol (MIRALAX / GLYCOLAX) packet Take 17 g by mouth daily as needed for mild constipation.    . pregabalin (LYRICA) 150 MG capsule Take 1 capsule (150 mg total) by mouth 3 (three) times daily. 270 capsule 1  . ranitidine (ZANTAC) 150 MG tablet Take 150 mg by mouth 2 (two) times daily as needed for heartburn.    . SUMAtriptan (IMITREX) 100 MG tablet Take 1 tablet (100 mg total) by mouth every 2 (two) hours as needed for migraine. 4 tablet 11  . telmisartan-hydrochlorothiazide (MICARDIS HCT) 80-12.5 MG per tablet Take 1 tablet by mouth daily. 90 tablet 3  . traMADol (ULTRAM) 50 MG tablet Take 1 tablet (50 mg total) by mouth 4 (four) times daily. 120 tablet 2  . vitamin C (ASCORBIC ACID) 500 MG tablet Take 1,000 mg by mouth daily.    . [DISCONTINUED] hyoscyamine (CYSTOSPAZ) 0.15 MG tablet Take 0.15 mg by mouth every 4 (four) hours as needed.     No current facility-administered medications for this visit.    Allergies as of 02/06/2016 - Review Complete 02/06/2016  Allergen Reaction Noted  . Soap  03/28/2014  . Sulfa antibiotics Nausea And Vomiting 12/18/2011    Family History  Problem Relation Age of Onset  . Colon cancer Neg Hx   . COPD Mother   . Colon polyps Maternal Grandmother   . Colon polyps Maternal Aunt   . Colon polyps      ? father   . Diabetes Paternal Grandfather   . Diabetes      MGGF, MGGM  . Diabetes Maternal Uncle   . Heart disease Father   . Heart disease Paternal Grandfather   . Aneurysm Maternal Aunt   . Stroke Sister   . Kidney disease Maternal Aunt   . Fibromyalgia Mother   . Fibromyalgia Sister   . Fibromyalgia      cousin    Social History   Social History  . Marital Status: Legally  Separated    Spouse Name: N/A  . Number of Children: 0  . Years of Education: N/A   Occupational History  . [place] [employer]   Social History Main Topics  . Smoking status: Never Smoker   . Smokeless tobacco: Never Used  . Alcohol Use: No  . Drug Use: No  . Sexual Activity: Yes   Other Topics Concern  . Not on file   Social History Narrative   Married. Education: [employer].      Physical Exam: BP 140/84 mmHg  Pulse 84  Ht 5' 3.5" (1.613 m)  Wt 291 lb (131.997 kg)  BMI 50.73 kg/m2 Constitutional: generally well-appearing Psychiatric: alert and oriented x3 Eyes: extraocular movements intact Mouth: oral pharynx moist, no lesions Neck: supple no lymphadenopathy Cardiovascular: heart regular rate and rhythm Lungs: clear to auscultation bilaterally Abdomen: soft, nontender, nondistended, no obvious ascites, no peritoneal signs, normal bowel sounds Extremities: no lower extremity edema bilaterally Skin: no lesions on visible extremities   Assessment and plan: 50 y.o. female with  Morbid obesity, abdominal pain, abnormal pancreas  Her presentation is consistent with acute pancreatitis the only curved volunteers that her chronic enzymes were normal. Repeat labs including CBC, complete about profile, amylase, lipase today. She has never had ultrasound evaluation of her gallbladder and we will arrange for that to be done since gallstones are a leading cause of acute pancreatitis. Her pain is fairly minimal, she started back on solids. I recommended she push fluids for now and she should be  eating relatively low-fat diet. She understands that she may need further imaging tests including MRI and or endoscopic ultrasound depending on how this plays out. If gallstones are noted in her gallbladder she may require cholecystectomy.   Rob Buntinganiel Brook Geraci, MD [facility] 02/06/2016, 11:03 AM  Cc: Sherren MochaShaw, Eva N, MD

## 2016-02-11 ENCOUNTER — Ambulatory Visit (HOSPITAL_COMMUNITY)
Admission: RE | Admit: 2016-02-11 | Discharge: 2016-02-11 | Disposition: A | Discharge status: Home / Self Care | Payer: 59 | Source: Ambulatory Visit | Attending: Gastroenterology | Admitting: Gastroenterology

## 2016-02-11 DIAGNOSIS — K760 Fatty (change of) liver, not elsewhere classified: Secondary | ICD-10-CM | POA: Diagnosis not present

## 2016-02-11 DIAGNOSIS — N281 Cyst of kidney, acquired: Secondary | ICD-10-CM | POA: Diagnosis not present

## 2016-02-11 DIAGNOSIS — R938 Abnormal findings on diagnostic imaging of other specified body structures: Secondary | ICD-10-CM | POA: Insufficient documentation

## 2016-02-11 DIAGNOSIS — R9389 Abnormal findings on diagnostic imaging of other specified body structures: Secondary | ICD-10-CM

## 2016-02-13 ENCOUNTER — Telehealth: Payer: Self-pay | Admitting: Gastroenterology

## 2016-02-13 ENCOUNTER — Other Ambulatory Visit: Payer: Self-pay

## 2016-02-13 DIAGNOSIS — R935 Abnormal findings on diagnostic imaging of other abdominal regions, including retroperitoneum: Secondary | ICD-10-CM

## 2016-02-13 DIAGNOSIS — R109 Unspecified abdominal pain: Secondary | ICD-10-CM

## 2016-02-13 NOTE — Telephone Encounter (Signed)
I am setting up her MRI of the pancreas. She is eating bland, low-fat and staying hydrated. She says she does not feel ready to return to work. Can she get a note for Monday to stay home?

## 2016-02-16 ENCOUNTER — Encounter: Payer: Self-pay | Admitting: Family Medicine

## 2016-02-16 ENCOUNTER — Telehealth: Payer: Self-pay | Admitting: Gastroenterology

## 2016-02-16 ENCOUNTER — Other Ambulatory Visit: Payer: Self-pay

## 2016-02-16 NOTE — Telephone Encounter (Signed)
Patient requests to have this note sent through MyChart. See letters.

## 2016-02-16 NOTE — Telephone Encounter (Signed)
Per Dr Christella HartiganJacobs pt can not have a note for any longer than Monday.  Pt  has been advised.

## 2016-02-16 NOTE — Telephone Encounter (Signed)
OK for a not for Monday,  But really no longer than that

## 2016-02-17 ENCOUNTER — Other Ambulatory Visit: Payer: Self-pay

## 2016-02-17 ENCOUNTER — Telehealth: Payer: Self-pay | Admitting: Gastroenterology

## 2016-02-17 DIAGNOSIS — R1010 Upper abdominal pain, unspecified: Secondary | ICD-10-CM

## 2016-02-18 ENCOUNTER — Other Ambulatory Visit: Payer: Self-pay

## 2016-02-18 DIAGNOSIS — R1084 Generalized abdominal pain: Secondary | ICD-10-CM

## 2016-02-18 DIAGNOSIS — R935 Abnormal findings on diagnostic imaging of other abdominal regions, including retroperitoneum: Secondary | ICD-10-CM

## 2016-02-18 DIAGNOSIS — R1010 Upper abdominal pain, unspecified: Secondary | ICD-10-CM

## 2016-02-18 DIAGNOSIS — R109 Unspecified abdominal pain: Secondary | ICD-10-CM

## 2016-02-19 ENCOUNTER — Ambulatory Visit (INDEPENDENT_AMBULATORY_CARE_PROVIDER_SITE_OTHER): Payer: 59 | Admitting: Family Medicine

## 2016-02-19 VITALS — BP 125/80 | HR 92 | Temp 97.7°F | Resp 18 | Wt 290.0 lb

## 2016-02-19 DIAGNOSIS — K8500 Idiopathic acute pancreatitis without necrosis or infection: Secondary | ICD-10-CM

## 2016-02-19 DIAGNOSIS — I10 Essential (primary) hypertension: Secondary | ICD-10-CM

## 2016-02-19 DIAGNOSIS — Z6841 Body Mass Index (BMI) 40.0 and over, adult: Secondary | ICD-10-CM

## 2016-02-19 MED ORDER — TELMISARTAN 80 MG PO TABS: 80.0000 mg | ORAL_TABLET | Freq: Every day | ORAL | Status: DC

## 2016-02-19 NOTE — Patient Instructions (Addendum)
Stop the micardis hct - it is the hctz portion of that that can cause pancreatitis - it looks like the micardis bp med is ok so we will leave you on that alone.  If you would like to recheck with me on Saturday May 20th we can review your scan, check your blood pressure, and decide about getting you back to work.  We recommend that you schedule a mammogram for breast cancer screening. Typically, you do not need a referral to do this. Please contact a local imaging center to schedule your mammogram.  [facility] - [phone]  *ask for the Radiology Department [facility]) - [phone] or [phone]  [facility] - [phone] [facility] - [phone] [facility] - [phone]  *ask for the Radiology Department [facility] - [phone]  *ask for the Radiology Department [facility] - [phone]  *ask for the Mammography Department [facility] - [phone]    IF you received an x-ray today, you will receive an invoice from [facility]. Please contact [facility] at [phone] with questions or concerns regarding your invoice.   IF you received labwork today, you will receive an invoice from [employer]. Please contact [facility] at [phone] with questions or concerns regarding your invoice.   Our billing staff will not be able to assist you with questions regarding bills from these companies.  You will be contacted with the lab results as soon as they are available. The fastest way to get your results is to activate your My Chart account. Instructions are located on the last page of this paperwork. If you have not heard from [place] regarding the results in 2 weeks, please contact this office.    Low-Fat Diet for Pancreatitis or Gallbladder Conditions A low-fat diet can be helpful if you have pancreatitis or a gallbladder  condition. With these conditions, your pancreas and gallbladder have trouble digesting fats. A healthy eating plan with less fat will help rest your pancreas and gallbladder and reduce your symptoms. WHAT DO I NEED TO KNOW ABOUT THIS DIET?  Eat a low-fat diet.  Reduce your fat intake to less than 20-30% of your total daily calories. This is less than 50-60 g of fat per day.  Remember that you need some fat in your diet. Ask your dietician what your daily goal should be.  Choose nonfat and low-fat healthy foods. Look for the words "nonfat," "low fat," or "fat free."  As a guide, look on the label and choose foods with less than 3 g of fat per serving. Eat only one serving.  Avoid alcohol.  Do not smoke. If you need help quitting, talk with your health care provider.  Eat small frequent meals instead of three large heavy meals. WHAT FOODS CAN I EAT? Grains Include healthy grains and starches such as potatoes, wheat bread, fiber-rich cereal, and brown rice. Choose whole grain options whenever possible. In adults, whole grains should account for 45-65% of your daily calories.  Fruits and Vegetables Eat plenty of fruits and vegetables. Fresh fruits and vegetables add fiber to your diet. Meats and Other Protein Sources Eat lean meat such as chicken and pork. Trim any fat off of meat before cooking it. Eggs, fish, and beans are other sources of protein. In adults, these foods should account for 10-35% of your daily calories. Dairy Choose low-fat milk and dairy options. Dairy includes fat and  protein, as well as calcium.  Fats and Oils Limit high-fat foods such as fried foods, sweets, baked goods, sugary drinks.  Other Creamy sauces and condiments, such as mayonnaise, can add extra fat. Think about whether or not you need to use them, or use smaller amounts or low fat options. WHAT FOODS ARE NOT RECOMMENDED?  High fat foods, such as:  [employer]aked goods.  Ice cream.  [place] toast.  Sweet  rolls.  Pizza.  Cheese bread.  Foods covered with batter, butter, creamy sauces, or cheese.  Fried foods.  Sugary drinks and desserts.  Foods that cause gas or bloating   This information is not intended to replace advice given to you by your health care provider. Make sure you discuss any questions you have with your health care provider.   Document Released: 10/02/2013 Document Reviewed: 10/02/2013 Elsevier Interactive Patient Education [employer]2016 Elsevier Inc.

## 2016-02-19 NOTE — Progress Notes (Signed)
Subjective:    Patient ID: Gloria Carter, female    DOB: Oct 11, 1966, 50 y.o.   MRN: [id number] By signing my name below, I, Gloria Carter, attest that this documentation has been prepared under the direction and in the presence of Norberto Sorenson, MD. Electronically Signed: Javier Carter, ER Scribe. 02/19/2016. 11:20 AM.  Chief Complaint  Patient presents with  . Follow-up    pancreatitis    HPI HPI Comments: Gloria Carter is a 50 y.o. female who presents to [facility] complaining of continued abdominal pain. She is having increased pressure with sitting up and reclining. She is on dicyclomine for bowel spasms which makes her sleepy. She is also suffering from loose stools. She is eating less. She has been eating mashed potatoes, macaroni and cheese, and chicken from ch[employer] file[employer]. She has been intermittently nauseous. She was given zofran in her ER. She has been sleeping normally. She has increased pain when she changes positions while sitting. Bending over causes increased pain. She is urinating normally. She is not having asthma sx. She does not drink or do drugs. He urine has been normal.   She was seen in the ER two weeks ago had an ER visit for upper abdominal pain. CT of the abdomen showed acute pancreatitis. She was recommended follow up imaging after resolution of current illness to RO pancreatic neoplasm. Lipase was normal. No hx of alcohol abuse. Sx improved with IV fluids, protonics and reglan. She was seen by GI Dr. Christella Hartigan three days later. She was recommended to stay on low fat diet. Labs were repeated, and were still normal. She had an [place] of her abdomen which showed fatty infiltration of pancreas, normal gall bladder no gall stones, as well as fatty liver. She has been arranged to have an MRI of her pancreas. MRI scheduled for May 18th.  Pt emailed in several days ago to be written out of work. Trouble sitting upright due to abdominal and back pressure. Pt has lost 15 lbs in the  past two weeks.   Past Medical History  Diagnosis Date  . Asthma   . Dysphagia   . Fibromyalgia   . Migraine   . GERD (gastroesophageal reflux disease)   . ADD (attention deficit disorder)   . Hernia of abdominal wall   . Allergy     ENVIRONMENTAL  . Hypertension   . Thyroid disease   . Fibromyalgia   . Numbness and tingling in hands   . Arthritis     knees  . IBS (irritable bowel syndrome)   . Difficulty sleeping   . Depression   . Swelling of both ankles   . Hyperlipidemia   . Obesity    Allergies  Allergen Reactions  . Soap     Any soaps with fragrance cause migraines  . Sulfa Antibiotics Nausea And Vomiting   Current Outpatient Prescriptions on File Prior to Visit  Medication Sig Dispense Refill  . ADVAIR DISKUS 250-50 MCG/DOSE AEPB INHALE 1 PUFF INTO THE LUNGS 2 (TWO) TIMES DAILY. 60 each 3  . albuterol (PROVENTIL HFA) 108 (90 BASE) MCG/ACT inhaler INHALE TWO PUFFS BY MOUTH EVERY SIX HOURS AS NEEDED (Patient taking differently: Inhale 2 puffs into the lungs every 6 (six) hours as needed for wheezing or shortness of breath. INHALE TWO PUFFS BY MOUTH EVERY SIX HOURS AS NEEDED) 18 Inhaler 11  . amphetamine-dextroamphetamine (ADDERALL) 10 MG tablet Take 1 tablet (10 mg total) by mouth 2 (two) times daily with a meal.  60 tablet 0  . azelastine (OPTIVAR) 0.05 % ophthalmic solution PLACE TWO DRIPS INTO BOTH EYES TWICE DAILY 6 mL 5  . B Complex-C-Folic Acid (B-COMPLEX BALANCED PO) Take 1 tablet by mouth daily.     . calcium carbonate (OS-CAL) 600 MG TABS Take 600 mg by mouth daily.    . carisoprodol (SOMA) 350 MG tablet Take 1 tablet (350 mg total) by mouth 4 (four) times daily as needed for muscle spasms. 120 tablet 2  . cetirizine (ZYRTEC) 10 MG tablet Take 10 mg by mouth daily as needed for allergies.     . cholecalciferol (VITAMIN D) 400 UNITS TABS Take 400 Units by mouth daily.    . citalopram (CELEXA) 10 MG tablet TAKE 1 TABLET (10 MG TOTAL) BY MOUTH AT BEDTIME 90 tablet  1  . dicyclomine (BENTYL) 20 MG tablet Take 1 tablet (20 mg total) by mouth 2 (two) times daily. 20 tablet 0  . esomeprazole (NEXIUM) 40 MG capsule TAKE ONE CAPSULE BY MOUTH ONE TIME DAILY 90 capsule 3  . ibuprofen (ADVIL,MOTRIN) 200 MG tablet Take 400-600 mg by mouth every 8 (eight) hours as needed for moderate pain.     Marland Kitchen. levothyroxine (SYNTHROID, LEVOTHROID) 75 MCG tablet Take 1 tablet (75 mcg total) by mouth daily before breakfast. 90 tablet 1  . meloxicam (MOBIC) 15 MG tablet TAKE 1 TABLET EVERY MORNING WITH FOOD AS NEEDED PAIN/INFLAMMATION.  1  . montelukast (SINGULAIR) 10 MG tablet Take 1 tablet (10 mg total) by mouth every morning. 90 tablet 1  . Multiple Vitamin (MULTIVITAMIN) tablet Take 1 tablet by mouth daily.    . ondansetron (ZOFRAN) 4 MG tablet Take 1 tablet (4 mg total) by mouth every 6 (six) hours. 12 tablet 0  . pantoprazole (PROTONIX) 20 MG tablet Take 1 tablet (20 mg total) by mouth daily. 30 tablet 0  . Polyethyl Glycol-Propyl Glycol (SYSTANE) 0.4-0.3 % SOLN Apply 1 drop to eye 3 (three) times daily as needed (dry eyes).    . polyethylene glycol (MIRALAX / GLYCOLAX) packet Take 17 g by mouth daily as needed for mild constipation.    . pregabalin (LYRICA) 150 MG capsule Take 1 capsule (150 mg total) by mouth 3 (three) times daily. 270 capsule 1  . ranitidine (ZANTAC) 150 MG tablet Take 150 mg by mouth 2 (two) times daily as needed for heartburn.    . SUMAtriptan (IMITREX) 100 MG tablet Take 1 tablet (100 mg total) by mouth every 2 (two) hours as needed for migraine. 4 tablet 11  . telmisartan-hydrochlorothiazide (MICARDIS HCT) 80-12.5 MG per tablet Take 1 tablet by mouth daily. 90 tablet 3  . traMADol (ULTRAM) 50 MG tablet Take 1 tablet (50 mg total) by mouth 4 (four) times daily. 120 tablet 2  . vitamin C (ASCORBIC ACID) 500 MG tablet Take 1,000 mg by mouth daily.    . [DISCONTINUED] hyoscyamine (CYSTOSPAZ) 0.15 MG tablet Take 0.15 mg by mouth every 4 (four) hours as needed.       No current facility-administered medications on file prior to visit.   Past Surgical History  Procedure Laterality Date  . Tonsilectomy/adenoidectomy with myringotomy    . Wisdom tooth extraction    . Ventral hernia repair N/A 03/28/2014    Procedure: LAPAROSCOPIC VENTRAL HERNIA REPAIR WITH MESH;  Surgeon: Velora Hecklerodd M Gerkin, MD;  Location: [facility];  Service: General;  Laterality: N/A;  . Insertion of mesh N/A 03/28/2014    Procedure: INSERTION OF MESH;  Surgeon: Velora Hecklerodd M Gerkin,  MD;  Location: [facility];  Service: General;  Laterality: N/A;   Family History  Problem Relation Age of Onset  . Colon cancer Neg Hx   . COPD Mother   . Colon polyps Maternal Grandmother   . Colon polyps Maternal Aunt   . Colon polyps      ? father  . Diabetes Paternal Grandfather   . Diabetes      MGGF, MGGM  . Diabetes Maternal Uncle   . Heart disease Father   . Heart disease Paternal Grandfather   . Aneurysm Maternal Aunt   . Stroke Sister   . Kidney disease Maternal Aunt   . Fibromyalgia Mother   . Fibromyalgia Sister   . Fibromyalgia      cousin  . Social History   Social History  . Marital Status: Legally Separated    Spouse Name: N/A  . Number of Children: 0  . Years of Education: N/A   Occupational History  . 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 Smoking status: Never Smoker   . Smokeless tobacco: Never Used  . Alcohol Use: No  . Drug Use: No  . Sexual Activity: Yes   Other Topics Concern  . None   Social History Narrative   Married. Education: [employer].    Depression screen [facility] 2/9 02/28/2016 02/19/2016 12/11/2015 08/21/2015 05/23/2015  Decreased Interest 0 0 0 0 0  Down, Depressed, Hopeless 0 0 0 0 0  PHQ - 2 Score 0 0 0 0 0     Review of Systems  Constitutional: Positive for diaphoresis, activity change, fatigue and unexpected weight change. Negative for fever, chills and appetite change.  Cardiovascular: Negative for leg swelling.  Gastrointestinal:  Positive for nausea and abdominal pain. Negative for vomiting, diarrhea, constipation, blood in stool, abdominal distention and anal bleeding.  Genitourinary: Negative for dysuria and frequency.  Musculoskeletal: Positive for myalgias, back pain, joint swelling, arthralgias and gait problem.  Skin: Negative for color change and rash.  Psychiatric/Behavioral: Positive for decreased concentration. Negative for confusion, sleep disturbance, dysphoric mood and agitation. The patient is not nervous/anxious.       Objective:  BP 125/80 mmHg  Pulse 92  Temp(Src) 97.7 F (36.5 C) (Oral)  Resp 18  Wt 290 lb (131.543 kg)  SpO2 96%  Physical Exam  Constitutional: She is oriented to person, place, and time. She appears well-developed and well-nourished. No distress.  HENT:  Head: Normocephalic and atraumatic.  Eyes: Pupils are equal, round, and reactive to light.  Neck: Neck supple.  Cardiovascular: Normal rate, regular rhythm and normal heart sounds.   No murmur heard. Normal S1, S2.   Pulmonary/Chest: Effort normal. No respiratory distress.  Abdominal: Bowel sounds are normal. There is tenderness.  Non distended. Diffuse abdominal pain, worse epigastric. Some guarding.   Musculoskeletal: Normal range of motion.  Neurological: She is alert and oriented to person, place, and time. Coordination normal.  Skin: Skin is warm and dry. She is not diaphoretic.  Psychiatric: She has a normal mood and affect. Her behavior is normal.  Nursing note and vitals reviewed.     Assessment & Plan:   1. Idiopathic acute pancreatitis   Unknown etiology I checked the side effects for lyrica, ultram, soma, celexa, and mircardis HCT. It looks like the hctz might be the med on her list that is most likely to trigger pancreatitis. Zofran and bentyl were started after her pancreatitis. Pt did have an unusual case the blood  labs were normal and the pancreatic inflammation was just seen on imaging. Pt does have an  MRI of her abd sched in 1 wk.  Pt still having to much pain to RTW so note given, try to stick to low fat diet. Stop hctz.  And switch to micardis only. Recheck in 9d - 5/20 - to review abd MRI, check BP since we are stopping hctz, and determine if she can be released back to work at that time.   Meds ordered this encounter  Medications  . telmisartan (MICARDIS) 80 MG tablet    Sig: Take 1 tablet (80 mg total) by mouth daily.    Dispense:  90 tablet    Refill:  0    I personally performed the services described in this documentation, which was scribed in my presence. The recorded information has been reviewed and considered, and addended by me as needed.  Norberto Sorenson, MD MPH

## 2016-02-26 ENCOUNTER — Ambulatory Visit
Admission: RE | Admit: 2016-02-26 | Discharge: 2016-02-26 | Disposition: A | Discharge status: Home / Self Care | Payer: 59 | Source: Ambulatory Visit | Attending: Gastroenterology | Admitting: Gastroenterology

## 2016-02-26 ENCOUNTER — Other Ambulatory Visit: Payer: 59

## 2016-02-26 DIAGNOSIS — R1084 Generalized abdominal pain: Secondary | ICD-10-CM

## 2016-02-26 DIAGNOSIS — R935 Abnormal findings on diagnostic imaging of other abdominal regions, including retroperitoneum: Secondary | ICD-10-CM

## 2016-02-26 MED ORDER — GADOBENATE DIMEGLUMINE 529 MG/ML IV SOLN
20.0000 mL | Freq: Once | INTRAVENOUS | Status: AC | PRN
  Administered 2016-02-26: 20 mL via INTRAVENOUS

## 2016-02-28 ENCOUNTER — Ambulatory Visit (INDEPENDENT_AMBULATORY_CARE_PROVIDER_SITE_OTHER): Payer: 59 | Admitting: Family Medicine

## 2016-02-28 VITALS — BP 134/84 | HR 85 | Temp 97.8°F | Resp 20 | Ht 63.25 in | Wt 289.6 lb

## 2016-02-28 DIAGNOSIS — K8500 Idiopathic acute pancreatitis without necrosis or infection: Secondary | ICD-10-CM

## 2016-02-28 DIAGNOSIS — R1013 Epigastric pain: Secondary | ICD-10-CM | POA: Diagnosis not present

## 2016-02-28 DIAGNOSIS — R1011 Right upper quadrant pain: Secondary | ICD-10-CM | POA: Diagnosis not present

## 2016-02-28 LAB — COMPREHENSIVE METABOLIC PANEL
ALBUMIN: 4.3 g/dL (ref 3.6–5.1)
ALK PHOS: 100 U/L (ref 33–130)
ALT: 21 U/L (ref 6–29)
AST: 21 U/L (ref 10–35)
BILIRUBIN TOTAL: 0.4 mg/dL (ref 0.2–1.2)
BUN: 10 mg/dL (ref 7–25)
CALCIUM: 9.5 mg/dL (ref 8.6–10.4)
CO2: 29 mmol/L (ref 20–31)
CREATININE: 0.65 mg/dL (ref 0.50–1.05)
Chloride: 100 mmol/L (ref 98–110)
Glucose, Bld: 100 mg/dL — ABNORMAL HIGH (ref 65–99)
Potassium: 3.8 mmol/L (ref 3.5–5.3)
SODIUM: 139 mmol/L (ref 135–146)
TOTAL PROTEIN: 7.2 g/dL (ref 6.1–8.1)

## 2016-02-28 LAB — POCT URINALYSIS DIP (MANUAL ENTRY)
BILIRUBIN UA: NEGATIVE
BILIRUBIN UA: NEGATIVE
Glucose, UA: NEGATIVE
NITRITE UA: NEGATIVE
PH UA: 5.5
PROTEIN UA: NEGATIVE
Spec Grav, UA: <=1.005
Urobilinogen, UA: 0.2

## 2016-02-28 LAB — POCT CBC
Granulocyte percent: 69.9 % (ref 37–80)
HCT, POC: 38.5 % (ref 37.7–47.9)
Hemoglobin: 13.3 g/dL (ref 12.2–16.2)
Lymph, poc: 2.3 (ref 0.6–3.4)
MCH, POC: 29.6 pg (ref 27–31.2)
MCHC: 34.5 g/dL (ref 31.8–35.4)
MCV: 86 fL (ref 80–97)
MID (cbc): 0.7 (ref 0–0.9)
MPV: 8.6 fL (ref 0–99.8)
POC Granulocyte: 7 — AB (ref 2–6.9)
POC LYMPH PERCENT: 23.1 % (ref 10–50)
POC MID %: 7 % (ref 0–12)
Platelet Count, POC: 198 10*3/uL (ref 142–424)
RBC: 4.48 M/uL (ref 4.04–5.48)
RDW, POC: 13.9 %
WBC: 10 10*3/uL (ref 4.6–10.2)

## 2016-02-28 LAB — POC MICROSCOPIC URINALYSIS (UMFC): Mucus: ABSENT

## 2016-02-28 LAB — LIPASE: Lipase: 5 U/L — ABNORMAL LOW (ref 7–60)

## 2016-02-28 LAB — GAMMA GT: GGT: 26 U/L (ref 7–51)

## 2016-02-28 LAB — AMYLASE: Amylase: 23 U/L (ref 0–105)

## 2016-02-28 MED ORDER — DICYCLOMINE HCL 20 MG PO TABS: 20.0000 mg | ORAL_TABLET | Freq: Three times a day (TID) | ORAL | Status: DC

## 2016-02-28 NOTE — Progress Notes (Addendum)
By signing my name below, I, Mesha Guinyard, attest that this documentation has been prepared under the direction and in the presence of Norberto Sorenson, MD.  Electronically Signed: Arvilla Market, Medical Scribe. 02/28/2016. 12:03 PM.  Subjective:    Patient ID: Gloria Carter, female    DOB: 1966-01-18, 50 y.o.   MRN: [id number]  HPI Chief Complaint  Patient presents with  . Follow-up    to discuss MRI results    HPI Comments: Gloria Carter is a 50 y.o. female who presents to the [facility] for a follow-up to discuss MRI results. Her abdominal pain has been the same since the last visit. She still has abdominal cramps and feels fine after she takes dicyclomine. She feels a tightness in her abdomen when she sits up. She feels nausea once in a while, but not regularly. Eating helps abdominal pain, and she denies nausea. She reports she won't stop eating cheese and isn't sure if it's a trigger. She has been eating grilled chicken, mashedpotatoes, green beans, and her parents makes sure she eats fresh fruits and vegetables. She's not eating anything spicy. She has drank and ate today. She hasn't touched bases with Dr. Christella Hartigan yet.  BP: She started micardis yesterday. She stopped HCTZ 2 days ago. She doesn't check her bp, but has a machine to check it at home.  Patient Active Problem List   Diagnosis Date Noted  . Vitamin D deficiency 06/09/2015  . Degeneration of lumbar or lumbosacral intervertebral disc 05/22/2015  . Right lumbar radiculopathy 05/22/2015  . Incarcerated ventral hernia 03/28/2014  . Obesity, morbid, BMI 50 or higher (HCC) 02/15/2014  . Prediabetes 09/08/2013  . Hypothyroidism 08/31/2013  . Asthma, chronic 08/31/2013  . Upper airway cough syndrome 08/31/2013  . Carpal tunnel syndrome 06/01/2013  . Wrist tendonitis 06/01/2013  . Encounter for medication monitoring 12/01/2012  . HTN (hypertension) 08/13/2012  . BMI 45.0-49.9, adult (HCC)  08/13/2012  . Hyperlipidemia 08/13/2012  . IBS (irritable bowel syndrome) 08/13/2012  . ADD (attention deficit disorder) 02/07/2012  . GERD (gastroesophageal reflux disease) 02/07/2012  . Migraine 02/07/2012  . Dysphagia 02/07/2012  . Hernia of abdominal wall 02/07/2012  . RAD (reactive airway disease) 12/20/2011  . Fibromyalgia 12/20/2011  . Chronic pain 12/20/2011   Past Medical History  Diagnosis Date  . Asthma   . Dysphagia   . Fibromyalgia   . Migraine   . GERD (gastroesophageal reflux disease)   . ADD (attention deficit disorder)   . Hernia of abdominal wall   . Allergy     ENVIRONMENTAL  . Hypertension   . Thyroid disease   . Fibromyalgia   . Numbness and tingling in hands   . Arthritis     knees  . IBS (irritable bowel syndrome)   . Difficulty sleeping   . Depression   . Swelling of both ankles   . Hyperlipidemia   . Obesity    Past Surgical History  Procedure Laterality Date  . Tonsilectomy/adenoidectomy with myringotomy    . Wisdom tooth extraction    . Ventral hernia repair N/A 03/28/2014    Procedure: LAPAROSCOPIC VENTRAL HERNIA REPAIR WITH MESH;  Surgeon: Velora Heckler, MD;  Location: [facility];  Service: General;  Laterality: N/A;  . Insertion of mesh N/A 03/28/2014    Procedure: INSERTION OF MESH;  Surgeon: Velora Heckler, MD;  Location: [facility];  Service: General;  Laterality: N/A;   Allergies  Allergen Reactions  . Soap  Any soaps with fragrance cause migraines  . Sulfa Antibiotics Nausea And Vomiting   Prior to Admission medications   Medication Sig Start Date End Date Taking? Authorizing Provider  ADVAIR DISKUS 250-50 MCG/DOSE AEPB INHALE 1 PUFF INTO THE LUNGS 2 (TWO) TIMES DAILY. 01/06/16  Yes Sherren Mocha, MD  albuterol (PROVENTIL HFA) 108 (90 BASE) MCG/ACT inhaler INHALE TWO PUFFS BY MOUTH EVERY SIX HOURS AS NEEDED Patient taking differently: Inhale 2 puffs into the lungs every 6 (six) hours as needed for wheezing or shortness of breath. INHALE  TWO PUFFS BY MOUTH EVERY SIX HOURS AS NEEDED 08/21/15  Yes Sherren Mocha, MD  amphetamine-dextroamphetamine (ADDERALL) 10 MG tablet Take 1 tablet (10 mg total) by mouth 2 (two) times daily with a meal. 12/11/15  Yes Sherren Mocha, MD  azelastine (OPTIVAR) 0.05 % ophthalmic solution PLACE TWO DRIPS INTO BOTH EYES TWICE DAILY 10/21/15  Yes Sherren Mocha, MD  B Complex-C-Folic Acid (B-COMPLEX BALANCED PO) Take 1 tablet by mouth daily.    Yes Historical Provider, MD  calcium carbonate (OS-CAL) 600 MG TABS Take 600 mg by mouth daily.   Yes Historical Provider, MD  carisoprodol (SOMA) 350 MG tablet Take 1 tablet (350 mg total) by mouth 4 (four) times daily as needed for muscle spasms. 12/11/15  Yes Sherren Mocha, MD  cetirizine (ZYRTEC) 10 MG tablet Take 10 mg by mouth daily as needed for allergies.    Yes Historical Provider, MD  cholecalciferol (VITAMIN D) 400 UNITS TABS Take 400 Units by mouth daily.   Yes Historical Provider, MD  citalopram (CELEXA) 10 MG tablet TAKE 1 TABLET (10 MG TOTAL) BY MOUTH AT BEDTIME 01/08/16  Yes Sherren Mocha, MD  dicyclomine (BENTYL) 20 MG tablet Take 1 tablet (20 mg total) by mouth 2 (two) times daily. 02/03/16  Yes Melton Krebs, PA-C  esomeprazole (NEXIUM) 40 MG capsule TAKE ONE CAPSULE BY MOUTH ONE TIME DAILY 08/21/15  Yes Sherren Mocha, MD  ibuprofen (ADVIL,MOTRIN) 200 MG tablet Take 400-600 mg by mouth every 8 (eight) hours as needed for moderate pain.    Yes Historical Provider, MD  levothyroxine (SYNTHROID, LEVOTHROID) 75 MCG tablet Take 1 tablet (75 mcg total) by mouth daily before breakfast. 12/11/15  Yes Sherren Mocha, MD  montelukast (SINGULAIR) 10 MG tablet Take 1 tablet (10 mg total) by mouth every morning. 08/21/15  Yes Sherren Mocha, MD  Multiple Vitamin (MULTIVITAMIN) tablet Take 1 tablet by mouth daily.   Yes Historical Provider, MD  ondansetron (ZOFRAN) 4 MG tablet Take 1 tablet (4 mg total) by mouth every 6 (six) hours. 02/03/16  Yes Melton Krebs, PA-C  Polyethyl  Glycol-Propyl Glycol (SYSTANE) 0.4-0.3 % SOLN Apply 1 drop to eye 3 (three) times daily as needed (dry eyes).   Yes Historical Provider, MD  pregabalin (LYRICA) 150 MG capsule Take 1 capsule (150 mg total) by mouth 3 (three) times daily. 12/11/15  Yes Sherren Mocha, MD  ranitidine (ZANTAC) 150 MG tablet Take 150 mg by mouth 2 (two) times daily as needed for heartburn.   Yes Historical Provider, MD  SUMAtriptan (IMITREX) 100 MG tablet Take 1 tablet (100 mg total) by mouth every 2 (two) hours as needed for migraine. 02/28/15  Yes Sherren Mocha, MD  telmisartan (MICARDIS) 80 MG tablet Take 1 tablet (80 mg total) by mouth daily. 02/19/16  Yes Sherren Mocha, MD  traMADol (ULTRAM) 50 MG tablet Take 1 tablet (50 mg total) by  mouth 4 (four) times daily. 12/11/15  Yes Sherren MochaEva N Eveline Sauve, MD  vitamin C (ASCORBIC ACID) 500 MG tablet Take 1,000 mg by mouth daily.   Yes Historical Provider, MD  meloxicam (MOBIC) 15 MG tablet Reported on 02/28/2016 01/16/16   Historical Provider, MD   Social History   Social History  . Marital Status: Legally Separated    Spouse Name: N/A  . Number of Children: 0  . Years of Education: N/A   Occupational History  . [place] [employer]   Social History Main Topics  . Smoking status: Never Smoker   . Smokeless tobacco: Never Used  . Alcohol Use: No  . Drug Use: No  . Sexual Activity: Yes   Other Topics Concern  . Not on file   Social History Narrative   Married. Education: [employer].    [facility] 02/28/2016 02/19/2016 12/11/2015 08/21/2015 05/23/2015  Decreased Interest 0 0 0 0 0  Down, Depressed, Hopeless 0 0 0 0 0  PHQ - 2 Score 0 0 0 0 0   Review of Systems  Constitutional: Negative for fever and appetite change.  HENT: Negative for congestion, sore throat and trouble swallowing.   Respiratory: Negative for cough, shortness of breath and wheezing.   Gastrointestinal: Positive for nausea (once in a while but not regularly) and abdominal pain. Negative for  vomiting, diarrhea and constipation.  Hematological: Does not bruise/bleed easily.   Objective:  BP 134/84 mmHg  Pulse 85  Temp(Src) 97.8 F (36.6 C) (Oral)  Resp 20  Ht 5' 3.25" (1.607 m)  Wt 289 lb 9.6 oz (131.362 kg)  BMI 50.87 kg/m2  SpO2 97%  Physical Exam  Constitutional: She appears well-developed and well-nourished. No distress.  HENT:  Head: Normocephalic and atraumatic.  Eyes: Conjunctivae are normal.  Neck: Neck supple.  Cardiovascular: Regular rhythm.  Tachycardia present.   Pulmonary/Chest: Effort normal and breath sounds normal. No respiratory distress. She has no wheezes. She has no rales.  Abdominal:  Epigastric, and LUQ tenderness to palpation  Neurological: She is alert.  Skin: Skin is warm and dry.  Psychiatric: She has a normal mood and affect. Her behavior is normal.  Nursing note and vitals reviewed.  Results for orders placed or performed in visit on 02/28/16  POCT CBC  Result Value Ref Range   WBC 10.0 4.6 - 10.2 K/uL   Lymph, poc 2.3 0.6 - 3.4   POC LYMPH PERCENT 23.1 10 - 50 %L   MID (cbc) 0.7 0 - 0.9   POC MID % 7.0 0 - 12 %M   POC Granulocyte 7.0 (A) 2 - 6.9   Granulocyte percent 69.9 37 - 80 %G   RBC 4.48 4.04 - 5.48 M/uL   Hemoglobin 13.3 12.2 - 16.2 g/dL   HCT, POC 16.138.5 09.637.7 - 47.9 %   MCV 86.0 80 - 97 fL   MCH, POC 29.6 27 - 31.2 pg   MCHC 34.5 31.8 - 35.4 g/dL   RDW, POC 04.513.9 %   Platelet Count, POC 198 142 - 424 K/uL   MPV 8.6 0 - 99.8 fL  POCT urinalysis dipstick  Result Value Ref Range   Color, UA yellow yellow   Clarity, UA clear clear   Glucose, UA negative negative   Bilirubin, UA negative negative   Ketones, POC UA negative negative   Spec Grav, UA <=1.005    Blood, UA trace-lysed (A) negative   pH, UA 5.5    Protein Ur,  POC negative negative   Urobilinogen, UA 0.2    Nitrite, UA Negative Negative   Leukocytes, UA small (1+) (A) Negative  POCT Microscopic Urinalysis ([facility])  Result Value Ref Range   WBC,UR,HPF,POC  Moderate (A) None WBC/hpf   RBC,UR,HPF,POC None None RBC/hpf   Bacteria Few (A) None, Too numerous to count   Mucus Absent Absent   Epithelial Cells, UR Per Microscopy Few (A) None, Too numerous to count cells/hpf   Assessment & Plan:   1. Idiopathic acute pancreatitis without infection or necrosis   2. Abdominal pain, right upper quadrant   3. Abdominal pain, epigastric   Pt is still not feeling able to return to work since atypical pancreatitis resulting in an ER visit on 4/25. She is still having significant amount of upper abdominal pain and reports she is unable to sit or walk for as long as she is required to do at her job.  Has been out of work for almost a month now. Her MRI imaging and labs look great through she continues to experience disabling pain so cont w/u with HIDA scan and additional labs below.  Saw Dr. Christella Hartigan w/ GI sev wks ago so if pain continues without source, she can follow-up again with Dr. Christella Hartigan. Try to stick to a low fat diet. Therapeutic trial with bentyl. Try to RTW 5/29 (9d from now).  Orders Placed This Encounter  Procedures  . Urine culture  . NM Hepato W/Eject Fract    Standing Status: Future     Number of Occurrences: 1     Standing Expiration Date: 04/29/2017    Scheduling Instructions:     Would like done this week prior to Friday 5/26    Order Specific Question:  Reason for Exam (SYMPTOM  OR DIAGNOSIS REQUIRED)    Answer:  continuing RUQ and epigastric pain, nml MRI other than hepatic steatosis    Order Specific Question:  Is the patient pregnant?    Answer:  No    Order Specific Question:  Preferred imaging location?    Answer:  [facility]    Order Specific Question:  If indicated for the ordered procedure, I authorize the administration of a radiopharmaceutical per Radiology protocol    Answer:  Yes  . Comprehensive metabolic panel  . Gamma GT  . Lipase  . Amylase  . POCT CBC  . POCT urinalysis dipstick  . POCT Microscopic  Urinalysis ([facility])    Meds ordered this encounter  Medications  . dicyclomine (BENTYL) 20 MG tablet    Sig: Take 1 tablet (20 mg total) by mouth 3 (three) times daily before meals.    Dispense:  60 tablet    Refill:  1   Over 40 min spent in face-to-face evaluation of and consultation with patient and coordination of care.  Over 50% of this time was spent counseling this patient.  I personally performed the services described in this documentation, which was scribed in my presence. The recorded information has been reviewed and considered, and addended by me as needed.   Norberto Sorenson, M.D.  [facility] [address] [place], [place] [address] [phone] phone [phone] fax  03/12/2016 12:09 PM

## 2016-02-28 NOTE — Patient Instructions (Addendum)
IF you received an x-ray today, you will receive an invoice from [facility]. Please contact [facility] at [phone] with questions or concerns regarding your invoice.   IF you received labwork today, you will receive an invoice from [employer]. Please contact [facility] at [phone] with questions or concerns regarding your invoice.   Our billing staff will not be able to assist you with questions regarding bills from these companies.  You will be contacted with the lab results as soon as they are available. The fastest way to get your results is to activate your My Chart account. Instructions are located on the last page of this paperwork. If you have not heard from [place] regarding the results in 2 weeks, please contact this office.     Nonalcoholic Fatty Liver Disease Diet Nonalcoholic fatty liver disease is a condition that causes fat to accumulate in and around the liver. The disease makes it harder for the liver to work the way that it should. Following a healthy diet can help to keep nonalcoholic fatty liver disease under control. It can also help to prevent or improve conditions that are associated with the disease, such as heart disease, diabetes, high blood pressure, and abnormal cholesterol levels. Along with regular exercise, this diet:  Promotes weight loss.  Helps to control blood sugar levels.  Helps to improve the way that the body uses insulin. WHAT DO I NEED TO KNOW ABOUT THIS DIET?  Use the glycemic index (GI) to plan your meals. The index tells you how quickly a food will raise your blood sugar. Choose low-GI foods. These foods take a longer time to raise blood sugar.  Keep track of how many calories you take in. Eating the right amount of calories will help you to achieve a healthy weight.  You may want to follow a Mediterranean diet. This diet includes a lot of vegetables, lean meats or fish, whole grains, fruits,  and healthy oils and fats. WHAT FOODS CAN I EAT? Grains Whole grains, such as whole-wheat or whole-grain breads, crackers, tortillas, cereals, and pasta. Stone-ground whole wheat. Pumpernickel bread. Unsweetened oatmeal. Bulgur. Barley. Quinoa. Brown or wild rice. Corn or whole-wheat flour tortillas. Vegetables Lettuce. Spinach. Peas. Beets. Cauliflower. Cabbage. Broccoli. Carrots. Tomatoes. Squash. Eggplant. Herbs. Peppers. Onions. Cucumbers. Brussels sprouts. Yams and sweet potatoes. Beans. Lentils. Fruits Bananas. Apples. Oranges. Grapes. Papaya. Mango. Pomegranate. Kiwi. Grapefruit. Cherries. Meats and Other Protein Sources Seafood and shellfish. Lean meats. Poultry. Tofu. Dairy Low-fat or fat-free dairy products, such as yogurt, cottage cheese, and cheese. Beverages Water. Sugar-free drinks. Tea. Coffee. Low-fat or skim milk. Milk alternatives, such as soy or almond milk. Real fruit juice. Condiments Mustard. Relish. Low-fat, low-sugar ketchup and barbecue sauce. Low-fat or fat-free mayonnaise. Sweets and Desserts Sugar-free sweets. Fats and Oils Avocado. Canola or olive oil. Nuts and nut butters. Seeds. The items listed above may not be a complete list of recommended foods or beverages. Contact your dietitian for more options.  WHAT FOODS ARE NOT RECOMMENDED? Palm oil and coconut oil. Processed foods. Fried foods. Sweetened drinks, such as sweet tea, milkshakes, snow cones, iced sweet drinks, and sodas. Alcohol. Sweets. Foods that contain a lot of salt or sodium. The items listed above may not be a complete list of foods and beverages to avoid. Contact your dietitian for more information.   This information is not intended to replace advice given to you by your health care provider. Make sure you discuss any questions you  have with your health care provider.   Document Released: 02/11/2015 Document Reviewed: 02/11/2015 Elsevier Interactive Patient Education 2016 Elsevier  Inc. Fatty Liver Fatty liver, also called hepatic steatosis or steatohepatitis, is a condition in which too much fat has built up in your liver cells. The liver removes harmful substances from your bloodstream. It produces fluids your body needs. It also helps your body use and store energy from the food you eat. In many cases, fatty liver does not cause symptoms or problems. It is often diagnosed when tests are being done for other reasons. However, over time, fatty liver can cause inflammation that may lead to more serious liver problems, such as scarring of the liver (cirrhosis). CAUSES  Causes of fatty liver may include:   Drinking too much alcohol.  Poor nutrition.  Obesity.  Cushing syndrome.  Diabetes.  Hyperlipidemia.  Pregnancy.  Certain drugs.  Poisons.  Some viral infections. RISK FACTORS You may be more likely to develop fatty liver if you:  Abuse alcohol.  Are pregnant.  Are overweight.  Have diabetes.  Have hepatitis.  Have a high triglyceride level.  SIGNS AND SYMPTOMS  Fatty liver often does not cause any symptoms. In cases where symptoms develop, they can include:  Fatigue.  Weakness.  Weight loss.  Confusion.   Abdominal pain.  Yellowing of your skin and the white parts of your eyes (jaundice).  Nausea and vomiting. DIAGNOSIS  Fatty liver may be diagnosed by:   Physical exam and medical history.  Blood tests.  Imaging tests, such as an ultrasound, CT scan, or MRI.  Liver biopsy. A small sample of liver tissue is removed using a needle. The sample is then looked at under a microscope. TREATMENT  Fatty liver is often caused by other health conditions. Treatment for fatty liver may involve medicines and lifestyle changes to manage conditions such as:   Alcoholism.  High cholesterol.  Diabetes.  Being overweight or obese.  HOME CARE INSTRUCTIONS  Eat a healthy diet as directed by your health care provider.  Exercise  regularly. This can help you lose weight and control your cholesterol and diabetes. Talk to your health care provider about an exercise plan and which activities are best for you.  Do not drink alcohol.   Take medicines only as directed by your health care provider. SEEK MEDICAL CARE IF: You have difficulty controlling your:  Blood sugar.  Cholesterol.  Alcohol consumption. SEEK IMMEDIATE MEDICAL CARE IF:  You have abdominal pain.  You have jaundice.  You have nausea and vomiting.   This information is not intended to replace advice given to you by your health care provider. Make sure you discuss any questions you have with your health care provider.   Document Released: 11/12/2005 Document Revised: 10/18/2014 Document Reviewed: 02/06/2014 Elsevier Interactive Patient Education 2016 Elsevier Inc.  Low-Fat Diet for Pancreatitis or Gallbladder Conditions A low-fat diet can be helpful if you have pancreatitis or a gallbladder condition. With these conditions, your pancreas and gallbladder have trouble digesting fats. A healthy eating plan with less fat will help rest your pancreas and gallbladder and reduce your symptoms. WHAT DO I NEED TO KNOW ABOUT THIS DIET?  Eat a low-fat diet.  Reduce your fat intake to less than 20-30% of your total daily calories. This is less than 50-60 g of fat per day.  Remember that you need some fat in your diet. Ask your dietician what your daily goal should be.  Choose nonfat and low-fat healthy  foods. Look for the words "nonfat," "low fat," or "fat free."  As a guide, look on the label and choose foods with less than 3 g of fat per serving. Eat only one serving.  Avoid alcohol.  Do not smoke. If you need help quitting, talk with your health care provider.  Eat small frequent meals instead of three large heavy meals. WHAT FOODS CAN I EAT? Grains Include healthy grains and starches such as potatoes, wheat bread, fiber-rich cereal, and brown  rice. Choose whole grain options whenever possible. In adults, whole grains should account for 45-65% of your daily calories.  Fruits and Vegetables Eat plenty of fruits and vegetables. Fresh fruits and vegetables add fiber to your diet. Meats and Other Protein Sources Eat lean meat such as chicken and pork. Trim any fat off of meat before cooking it. Eggs, fish, and beans are other sources of protein. In adults, these foods should account for 10-35% of your daily calories. Dairy Choose low-fat milk and dairy options. Dairy includes fat and protein, as well as calcium.  Fats and Oils Limit high-fat foods such as fried foods, sweets, baked goods, sugary drinks.  Other Creamy sauces and condiments, such as mayonnaise, can add extra fat. Think about whether or not you need to use them, or use smaller amounts or low fat options. WHAT FOODS ARE NOT RECOMMENDED?  High fat foods, such as:  [employer] CorporationBaked goods.  Ice cream.  [place] toast.  Sweet rolls.  Pizza.  Cheese bread.  Foods covered with batter, butter, creamy sauces, or cheese.  Fried foods.  Sugary drinks and desserts.  Foods that cause gas or bloating   This information is not intended to replace advice given to you by your health care provider. Make sure you discuss any questions you have with your health care provider.   Document Released: 10/02/2013 Document Reviewed: 10/02/2013 Elsevier Interactive Patient Education [employer]2016 Elsevier Inc.

## 2016-03-01 ENCOUNTER — Telehealth: Payer: Self-pay

## 2016-03-01 NOTE — Telephone Encounter (Signed)
LMOM and sent the patient a MyChart message.  The patient is scheduled for the nuclear medicine hepatic scan (liver scan) on 03/03/16 at 7:30am (arrival time is 7:15am) [facility].  The patient should refrain from taking any opioid medications 6 hours prior to the appointment, if applicable.

## 2016-03-03 ENCOUNTER — Ambulatory Visit (HOSPITAL_COMMUNITY)
Admission: RE | Admit: 2016-03-03 | Discharge: 2016-03-03 | Disposition: A | Discharge status: Home / Self Care | Payer: 59 | Source: Ambulatory Visit | Attending: Family Medicine | Admitting: Family Medicine

## 2016-03-03 DIAGNOSIS — R1011 Right upper quadrant pain: Secondary | ICD-10-CM

## 2016-03-03 DIAGNOSIS — K8500 Idiopathic acute pancreatitis without necrosis or infection: Secondary | ICD-10-CM | POA: Diagnosis not present

## 2016-03-03 DIAGNOSIS — R1013 Epigastric pain: Secondary | ICD-10-CM

## 2016-03-03 MED ORDER — TECHNETIUM TC 99M MEBROFENIN IV KIT
5.2000 | PACK | Freq: Once | INTRAVENOUS | Status: AC | PRN
  Administered 2016-03-03: 5.2 via INTRAVENOUS

## 2016-03-05 ENCOUNTER — Other Ambulatory Visit: Payer: Self-pay | Admitting: Family Medicine

## 2016-03-09 ENCOUNTER — Telehealth: Payer: Self-pay

## 2016-03-09 NOTE — Telephone Encounter (Signed)
Patient needs attending Provider Statement completed by Dr Clelia CroftShaw, I have filled out what I could from the OV notes and I will place them in your box on 03/09/16 if you could complete the highlighted areas and return it to the FMLA/Disability box at the 102 checkout desk within 5-7 business days. Thank you

## 2016-03-10 ENCOUNTER — Telehealth: Payer: Self-pay

## 2016-03-10 NOTE — Telephone Encounter (Signed)
Patient brought in FMLA forms on 03/09/16 to be completed by Dr Clelia CroftShaw. I have completed what I could from the OV notes and highlighted the areas that need to be updated. I will place these in your box on 03/10/16. If you could please return them to the FMLA/Disability box at the [employer] checkout desk within 5-7 business days. Thank you!

## 2016-03-18 ENCOUNTER — Ambulatory Visit (INDEPENDENT_AMBULATORY_CARE_PROVIDER_SITE_OTHER): Payer: 59 | Admitting: Family Medicine

## 2016-03-18 ENCOUNTER — Encounter: Payer: Self-pay | Admitting: Family Medicine

## 2016-03-18 VITALS — BP 116/81 | HR 91 | Temp 98.3°F | Resp 18 | Ht 63.0 in | Wt 289.0 lb

## 2016-03-18 DIAGNOSIS — G8929 Other chronic pain: Secondary | ICD-10-CM | POA: Diagnosis not present

## 2016-03-18 DIAGNOSIS — M797 Fibromyalgia: Secondary | ICD-10-CM

## 2016-03-18 MED ORDER — TRAMADOL HCL 50 MG PO TABS
50.0000 mg | ORAL_TABLET | Freq: Four times a day (QID) | ORAL | Status: DC
Start: 2016-03-18 — End: 2016-06-24

## 2016-03-18 MED ORDER — AMPHETAMINE-DEXTROAMPHETAMINE 10 MG PO TABS: 10.0000 mg | ORAL_TABLET | Freq: Two times a day (BID) | ORAL | Status: DC

## 2016-03-18 MED ORDER — AMPHETAMINE-DEXTROAMPHETAMINE 10 MG PO TABS
10.0000 mg | ORAL_TABLET | Freq: Two times a day (BID) | ORAL | Status: DC
Start: 2016-03-18 — End: 2016-06-24

## 2016-03-18 MED ORDER — CARISOPRODOL 350 MG PO TABS: 350.0000 mg | ORAL_TABLET | Freq: Four times a day (QID) | ORAL | Status: DC | PRN

## 2016-03-18 MED ORDER — CITALOPRAM HYDROBROMIDE 20 MG PO TABS: 20.0000 mg | ORAL_TABLET | Freq: Every day | ORAL | Status: DC

## 2016-03-18 NOTE — Patient Instructions (Signed)
     IF you received an x-ray today, you will receive an invoice from [facility]. Please contact [facility] at [phone] with questions or concerns regarding your invoice.   IF you received labwork today, you will receive an invoice from [facility]. Please contact [facility] at [phone] with questions or concerns regarding your invoice.   Our billing staff will not be able to assist you with questions regarding bills from these companies.  You will be contacted with the lab results as soon as they are available. The fastest way to get your results is to activate your My Chart account. Instructions are located on the last page of this paperwork. If you have not heard from us regarding the results in 2 weeks, please contact this office.      

## 2016-03-18 NOTE — Telephone Encounter (Signed)
Completed. Copy scanned in and original given to pt.

## 2016-03-18 NOTE — Progress Notes (Signed)
Subjective:    Patient ID: Gloria Carter, female    DOB: [birth year], 50 y.o.   MRN: [id number]  Chief Complaint  Patient presents with  . Medication Refill  . FMLA paperwork    HPI  Did have nausea on Tues and Wed which was new.  Has been back to work since last Tuesday - so for 1 1/2 wks. Went back on the 30thy  Left hip and bilateral knee are paijnful - knee inj done last Oct and most painful - younger Dr. Eulah Pont.    Still not back to normal diet - keeping fast food limited to chick-fil[employer]a grilled, baked chix, mashed potatoes, fruit, does not like veggies.  No soda, no fast food.  Did go out to eat last night - and was a little uncomfortable. A little constipated  Past Medical History  Diagnosis Date  . Asthma   . Dysphagia   . Fibromyalgia   . Migraine   . GERD (gastroesophageal reflux disease)   . ADD (attention deficit disorder)   . Hernia of abdominal wall   . Allergy     ENVIRONMENTAL  . Hypertension   . Thyroid disease   . Fibromyalgia   . Numbness and tingling in hands   . Arthritis     knees  . IBS (irritable bowel syndrome)   . Difficulty sleeping   . Depression   . Swelling of both ankles   . Hyperlipidemia   . Obesity    Past Surgical History  Procedure Laterality Date  . Tonsilectomy/adenoidectomy with myringotomy    . Wisdom tooth extraction    . Ventral hernia repair N/A 03/28/2014    Procedure: LAPAROSCOPIC VENTRAL HERNIA REPAIR WITH MESH;  Surgeon: Velora Heckler, MD;  Location: [facility];  Service: General;  Laterality: N/A;  . Insertion of mesh N/A 03/28/2014    Procedure: INSERTION OF MESH;  Surgeon: Velora Heckler, MD;  Location: [facility];  Service: General;  Laterality: N/A;   Current Outpatient Prescriptions on File Prior to Visit  Medication Sig Dispense Refill  . ADVAIR DISKUS 250-50 MCG/DOSE AEPB INHALE 1 PUFF INTO THE LUNGS 2 (TWO) TIMES DAILY. 60 each 3  . albuterol (PROVENTIL HFA) 108 (90 BASE) MCG/ACT inhaler INHALE TWO PUFFS BY  MOUTH EVERY SIX HOURS AS NEEDED (Patient taking differently: Inhale 2 puffs into the lungs every 6 (six) hours as needed for wheezing or shortness of breath. INHALE TWO PUFFS BY MOUTH EVERY SIX HOURS AS NEEDED) 18 Inhaler 11  . azelastine (OPTIVAR) 0.05 % ophthalmic solution PLACE TWO DRIPS INTO BOTH EYES TWICE DAILY 6 mL 5  . B Complex-C-Folic Acid (B-COMPLEX BALANCED PO) Take 1 tablet by mouth daily.     . calcium carbonate (OS-CAL) 600 MG TABS Take 600 mg by mouth daily.    . cetirizine (ZYRTEC) 10 MG tablet Take 10 mg by mouth daily as needed for allergies.     . cholecalciferol (VITAMIN D) 400 UNITS TABS Take 400 Units by mouth daily.    Marland Kitchen dicyclomine (BENTYL) 20 MG tablet Take 1 tablet (20 mg total) by mouth 3 (three) times daily before meals. 60 tablet 1  . esomeprazole (NEXIUM) 40 MG capsule TAKE ONE CAPSULE BY MOUTH ONE TIME DAILY 90 capsule 3  . levothyroxine (SYNTHROID, LEVOTHROID) 75 MCG tablet Take 1 tablet (75 mcg total) by mouth daily before breakfast. 90 tablet 1  . montelukast (SINGULAIR) 10 MG tablet Take 1 tablet (10 mg total) by mouth every morning. 90 tablet  1  . Multiple Vitamin (MULTIVITAMIN) tablet Take 1 tablet by mouth daily.    . ondansetron (ZOFRAN) 4 MG tablet Take 1 tablet (4 mg total) by mouth every 6 (six) hours. 12 tablet 0  . Polyethyl Glycol-Propyl Glycol (SYSTANE) 0.4-0.3 % SOLN Apply 1 drop to eye 3 (three) times daily as needed (dry eyes).    . pregabalin (LYRICA) 150 MG capsule Take 1 capsule (150 mg total) by mouth 3 (three) times daily. 270 capsule 1  . ranitidine (ZANTAC) 150 MG tablet Take 150 mg by mouth 2 (two) times daily as needed for heartburn.    . SUMAtriptan (IMITREX) 100 MG tablet Take 1 tablet (100 mg total) by mouth every 2 (two) hours as needed for migraine. 4 tablet 11  . telmisartan-hydrochlorothiazide (MICARDIS HCT) 80-12.5 MG tablet TAKE 1 TABLET BY MOUTH DAILY. 90 tablet 0  . vitamin C (ASCORBIC ACID) 500 MG tablet Take 1,000 mg by mouth  daily.    . [DISCONTINUED] hyoscyamine (CYSTOSPAZ) 0.15 MG tablet Take 0.15 mg by mouth every 4 (four) hours as needed.     No current facility-administered medications on file prior to visit.   Allergies  Allergen Reactions  . Soap     Any soaps with fragrance cause migraines  . Sulfa Antibiotics Nausea And Vomiting   Family History  Problem Relation Age of Onset  . Colon cancer Neg Hx   . COPD Mother   . Colon polyps Maternal Grandmother   . Colon polyps Maternal Aunt   . Colon polyps      ? father  . Diabetes Paternal Grandfather   . Diabetes      MGGF, MGGM  . Diabetes Maternal Uncle   . Heart disease Father   . Heart disease Paternal Grandfather   . Aneurysm Maternal Aunt   . Stroke Sister   . Kidney disease Maternal Aunt   . Fibromyalgia Mother   . Fibromyalgia Sister   . Fibromyalgia      cousin   Social History   Social History  . Marital Status: Legally Separated    Spouse Name: N/A  . Number of Children: 0  . Years of Education: N/A   Occupational History  . 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 Smoking status: Never Smoker   . Smokeless tobacco: Never Used  . Alcohol Use: No  . Drug Use: No  . Sexual Activity: Yes   Other Topics Concern  . None   Social History Narrative   Married. Education: [employer].      Review of Systems See HPI.    Objective:  BP 116/81 mmHg  Pulse 91  Temp(Src) 98.3 F (36.8 C) (Oral)  Resp 18  Ht  (1.6 m)  Wt 289 lb (131.09 kg)  BMI 51.21 kg/m2  Physical Exam  Constitutional: She is oriented to person, place, and time. She appears well-developed and well-nourished. No distress.  HENT:  Head: Normocephalic and atraumatic.  Right Ear: External ear normal.  Left Ear: External ear normal.  Eyes: Conjunctivae are normal. No scleral icterus.  Neck: Normal range of motion. Neck supple. No thyromegaly present.  Cardiovascular: Normal rate, regular rhythm, normal heart sounds and intact  distal pulses.   Pulmonary/Chest: Effort normal and breath sounds normal. No respiratory distress.  Musculoskeletal: She exhibits no edema.  Using rolling walker  Lymphadenopathy:    She has no cervical adenopathy.  Neurological: She is alert and oriented to person, place, and time.  Skin: Skin is warm and dry. She is not diaphoretic. No erythema.  Psychiatric: Her behavior is normal. She exhibits a depressed mood.          Assessment & Plan:   1. Fibromyalgia   2. Chronic pain   refilled chronic meds  Meds ordered this encounter  Medications  . traMADol (ULTRAM) 50 MG tablet    Sig: Take 1 tablet (50 mg total) by mouth 4 (four) times daily.    Dispense:  120 tablet    Refill:  2  . carisoprodol (SOMA) 350 MG tablet    Sig: Take 1 tablet (350 mg total) by mouth 4 (four) times daily as needed for muscle spasms.    Dispense:  120 tablet    Refill:  2  . amphetamine-dextroamphetamine (ADDERALL) 10 MG tablet    Sig: Take 1 tablet (10 mg total) by mouth 2 (two) times daily with a meal.    Dispense:  60 tablet    Refill:  0    May fill 56 days after date written  . amphetamine-dextroamphetamine (ADDERALL) 10 MG tablet    Sig: Take 1 tablet (10 mg total) by mouth 2 (two) times daily.    Dispense:  60 tablet    Refill:  0    May fill 28 days after date written  . amphetamine-dextroamphetamine (ADDERALL) 10 MG tablet    Sig: Take 1 tablet (10 mg total) by mouth 2 (two) times daily.    Dispense:  60 tablet    Refill:  0  . citalopram (CELEXA) 20 MG tablet    Sig: Take 1 tablet (20 mg total) by mouth daily.    Dispense:  90 tablet    Refill:  1   Over 25 min spent in face-to-face evaluation of and consultation with patient and coordination of care.  Over 50% of this time was spent counseling this patient.   Norberto SorensonEva Shaw, M.D.  [facility]  [place] [address] [place], [place] [address] [phone] phone [phone]) [phone] fax  04/08/2016 11:45 PM

## 2016-03-18 NOTE — Telephone Encounter (Signed)
Completed, copy scanned in and original given to pt.

## 2016-03-26 DIAGNOSIS — Z0271 Encounter for disability determination: Secondary | ICD-10-CM

## 2016-05-18 ENCOUNTER — Other Ambulatory Visit: Payer: Self-pay | Admitting: Family Medicine

## 2016-06-07 ENCOUNTER — Other Ambulatory Visit: Payer: Self-pay | Admitting: Family Medicine

## 2016-06-07 DIAGNOSIS — G43809 Other migraine, not intractable, without status migrainosus: Secondary | ICD-10-CM

## 2016-06-09 ENCOUNTER — Other Ambulatory Visit: Payer: Self-pay

## 2016-06-24 ENCOUNTER — Encounter: Payer: Self-pay | Admitting: Family Medicine

## 2016-06-24 ENCOUNTER — Ambulatory Visit (INDEPENDENT_AMBULATORY_CARE_PROVIDER_SITE_OTHER): Payer: 59 | Admitting: Family Medicine

## 2016-06-24 DIAGNOSIS — G43809 Other migraine, not intractable, without status migrainosus: Secondary | ICD-10-CM | POA: Diagnosis not present

## 2016-06-24 DIAGNOSIS — G8929 Other chronic pain: Secondary | ICD-10-CM | POA: Diagnosis not present

## 2016-06-24 DIAGNOSIS — M797 Fibromyalgia: Secondary | ICD-10-CM | POA: Diagnosis not present

## 2016-06-24 MED ORDER — LEVOTHYROXINE SODIUM 75 MCG PO TABS: 75.0000 ug | ORAL_TABLET | Freq: Every day | ORAL | 1 refills | Status: DC

## 2016-06-24 MED ORDER — AMPHETAMINE-DEXTROAMPHETAMINE 10 MG PO TABS: 10.0000 mg | ORAL_TABLET | Freq: Two times a day (BID) | ORAL | 0 refills | Status: DC

## 2016-06-24 MED ORDER — ONDANSETRON HCL 4 MG PO TABS: 4.0000 mg | ORAL_TABLET | Freq: Four times a day (QID) | ORAL | 1 refills | Status: DC

## 2016-06-24 MED ORDER — CARISOPRODOL 350 MG PO TABS: 350.0000 mg | ORAL_TABLET | Freq: Four times a day (QID) | ORAL | 2 refills | Status: DC | PRN

## 2016-06-24 MED ORDER — TELMISARTAN-HCTZ 80-12.5 MG PO TABS: 1.0000 | ORAL_TABLET | Freq: Every day | ORAL | 3 refills | Status: DC

## 2016-06-24 MED ORDER — DICYCLOMINE HCL 20 MG PO TABS: 20.0000 mg | ORAL_TABLET | Freq: Three times a day (TID) | ORAL | 0 refills | Status: DC

## 2016-06-24 MED ORDER — FLUTICASONE-SALMETEROL 250-50 MCG/DOSE IN AEPB: 1.0000 | INHALATION_SPRAY | Freq: Two times a day (BID) | RESPIRATORY_TRACT | 5 refills | Status: DC | PRN

## 2016-06-24 MED ORDER — ONDANSETRON HCL 4 MG PO TABS: 4.0000 mg | ORAL_TABLET | Freq: Four times a day (QID) | ORAL | 0 refills | Status: DC

## 2016-06-24 MED ORDER — MONTELUKAST SODIUM 10 MG PO TABS: 10.0000 mg | ORAL_TABLET | Freq: Every morning | ORAL | 3 refills | Status: DC

## 2016-06-24 MED ORDER — SUMATRIPTAN SUCCINATE 100 MG PO TABS: 100.0000 mg | ORAL_TABLET | ORAL | 0 refills | Status: DC | PRN

## 2016-06-24 MED ORDER — PREGABALIN 150 MG PO CAPS: 150.0000 mg | ORAL_CAPSULE | Freq: Three times a day (TID) | ORAL | 1 refills | Status: DC

## 2016-06-24 MED ORDER — SUMATRIPTAN SUCCINATE 100 MG PO TABS: 100.0000 mg | ORAL_TABLET | ORAL | 3 refills | Status: DC | PRN

## 2016-06-24 MED ORDER — TRAMADOL HCL 50 MG PO TABS: 50.0000 mg | ORAL_TABLET | Freq: Four times a day (QID) | ORAL | 2 refills | Status: DC

## 2016-06-24 MED ORDER — ALBUTEROL SULFATE HFA 108 (90 BASE) MCG/ACT IN AERS: INHALATION_SPRAY | RESPIRATORY_TRACT | 11 refills | Status: DC

## 2016-06-24 MED ORDER — MONTELUKAST SODIUM 10 MG PO TABS: 10.0000 mg | ORAL_TABLET | Freq: Every morning | ORAL | 1 refills | Status: DC

## 2016-06-24 NOTE — Patient Instructions (Signed)
     IF you received an x-ray today, you will receive an invoice from [facility]. Please contact [facility] at [phone] with questions or concerns regarding your invoice.   IF you received labwork today, you will receive an invoice from [facility]. Please contact [facility] at [phone] with questions or concerns regarding your invoice.   Our billing staff will not be able to assist you with questions regarding bills from these companies.  You will be contacted with the lab results as soon as they are available. The fastest way to get your results is to activate your My Chart account. Instructions are located on the last page of this paperwork. If you have not heard from us regarding the results in 2 weeks, please contact this office.      

## 2016-06-24 NOTE — Progress Notes (Signed)
Subjective:    Patient ID: Gloria GrovesBarbara Carter, female    DOB: [birth year], 50 y.o.   MRN: [id number] Chief Complaint  Patient presents with  . Follow-up    3 month/refill on zofran  . Flu Vaccine    HPI  Husband has still not work.  Husband has been  Nice to her.  Migraine every few months - maybe worse with all of the seasonal allergies  Was able to get it gone. Husband has signed the divorce papers this wk. 11/27/2020 - retirement New boss -  Stomach iffy - can't do pizza, ok with cheese.  Can do small amounts of salads. occ nausea.   Diarrhea with fruit which she is eating more of.  Does more canned fruit in winter. Lots of mac and cheese, mashed potatoes, chickenand [place]key - grilled. Staying away from [employer] but can't  Stopped butter on her baked potatoes Rice chex  120/75 bp in the evening when relaxed. tol meds Urinary urgency but not waking up over the night to void. occ GERD with salds  Will occ forget the evening dose of lyrica but will get in the morning and mid-day. Ran out of zofran but would like a refill  Lots of drainage and cough prod so back on mucinex with , singulair, and zyrtec. Takes adderall around 6:30 and 1:30  Fruit cups, kraft mac and cheese, and sanwich - taking bentyl as needed -1-2x/wk On adderal 7.4 qd twoards since 2013, increased to bid, then up to 10 bid 6 mos ago  Past Medical History:  Diagnosis Date  . ADD (attention deficit disorder)   . Allergy    ENVIRONMENTAL  . Arthritis    knees  . Asthma   . Depression   . Difficulty sleeping   . Dysphagia   . Fibromyalgia   . Fibromyalgia   . GERD (gastroesophageal reflux disease)   . Hernia of abdominal wall   . Hyperlipidemia   . Hypertension   . IBS (irritable bowel syndrome)   . Migraine   . Numbness and tingling in hands   . Obesity   . Swelling of both ankles   . Thyroid disease    Past Surgical History:  Procedure Laterality Date  . INSERTION OF MESH N/A 03/28/2014   Procedure: INSERTION OF MESH;  Surgeon: Velora Hecklerodd M Gerkin, MD;  Location: [facility];  Service: General;  Laterality: N/A;  . TONSILECTOMY/ADENOIDECTOMY WITH MYRINGOTOMY    . VENTRAL HERNIA REPAIR N/A 03/28/2014   Procedure: LAPAROSCOPIC VENTRAL HERNIA REPAIR WITH MESH;  Surgeon: Velora Hecklerodd M Gerkin, MD;  Location: [facility];  Service: General;  Laterality: N/A;  . WISDOM TOOTH EXTRACTION     Current Outpatient Prescriptions on File Prior to Visit  Medication Sig Dispense Refill  . albuterol (PROVENTIL HFA) 108 (90 BASE) MCG/ACT inhaler INHALE TWO PUFFS BY MOUTH EVERY SIX HOURS AS NEEDED (Patient taking differently: Inhale 2 puffs into the lungs every 6 (six) hours as needed for wheezing or shortness of breath. INHALE TWO PUFFS BY MOUTH EVERY SIX HOURS AS NEEDED) 18 Inhaler 11  . azelastine (OPTIVAR) 0.05 % ophthalmic solution PLACE TWO DRIPS INTO BOTH EYES TWICE DAILY 6 mL 5  . B Complex-C-Folic Acid (B-COMPLEX BALANCED PO) Take 1 tablet by mouth daily.     . calcium carbonate (OS-CAL) 600 MG TABS Take 600 mg by mouth daily.    . cetirizine (ZYRTEC) 10 MG tablet Take 10 mg by mouth daily as needed for allergies.     .Marland Kitchen  cholecalciferol (VITAMIN D) 400 UNITS TABS Take 400 Units by mouth daily.    . citalopram (CELEXA) 20 MG tablet Take 1 tablet (20 mg total) by mouth daily. 90 tablet 1  . esomeprazole (NEXIUM) 40 MG capsule TAKE ONE CAPSULE BY MOUTH ONE TIME DAILY 90 capsule 3  . Multiple Vitamin (MULTIVITAMIN) tablet Take 1 tablet by mouth daily.    Bertram Gala Glycol-Propyl Glycol (SYSTANE) 0.4-0.3 % SOLN Apply 1 drop to eye 3 (three) times daily as needed (dry eyes).    . ranitidine (ZANTAC) 150 MG tablet Take 150 mg by mouth 2 (two) times daily as needed for heartburn.    . vitamin C (ASCORBIC ACID) 500 MG tablet Take 1,000 mg by mouth daily.    . [DISCONTINUED] hyoscyamine (CYSTOSPAZ) 0.15 MG tablet Take 0.15 mg by mouth every 4 (four) hours as needed.     No current facility-administered medications on file  prior to visit.    Allergies  Allergen Reactions  . Soap     Any soaps with fragrance cause migraines  . Sulfa Antibiotics Nausea And Vomiting   Family History  Problem Relation Age of Onset  . COPD Mother   . Fibromyalgia Mother   . Heart disease Father   . Colon polyps Maternal Grandmother   . Diabetes Paternal Grandfather   . Heart disease Paternal Grandfather   . Colon polyps Maternal Aunt   . Colon polyps      ? father  . Diabetes      MGGF, MGGM  . Diabetes Maternal Uncle   . Aneurysm Maternal Aunt   . Stroke Sister   . Kidney disease Maternal Aunt   . Fibromyalgia Sister   . Fibromyalgia      cousin  . Colon cancer Neg Hx    Social History   Social History  . Marital status: Legally Separated    Spouse name: N/A  . Number of children: 0  . Years of education: N/A   Occupational History  . 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 Smoking status: Never Smoker  . Smokeless tobacco: Never Used  . Alcohol use No  . Drug use: No  . Sexual activity: Yes   Other Topics Concern  . None   Social History Narrative   Married. Education: [employer].     Review of Systems  Constitutional: Positive for fatigue. Negative for activity change, appetite change, chills and fever.  Respiratory: Negative for chest tightness and shortness of breath.   Cardiovascular: Positive for leg swelling. Negative for chest pain and palpitations.  Gastrointestinal: Positive for abdominal pain. Negative for nausea and vomiting.  Musculoskeletal: Positive for arthralgias, back pain, gait problem, joint swelling and myalgias.  Neurological: Positive for headaches.  Psychiatric/Behavioral: Positive for decreased concentration, dysphoric mood and sleep disturbance. Negative for agitation.       Objective:   Physical Exam  Constitutional: She is oriented to person, place, and time. She appears well-developed and well-nourished. No distress.  HENT:  Head:  Normocephalic and atraumatic.  Right Ear: External ear normal.  Left Ear: External ear normal.  Eyes: Conjunctivae are normal. No scleral icterus.  Neck: Normal range of motion. Neck supple. No thyromegaly present.  Cardiovascular: Normal rate, regular rhythm, normal heart sounds and intact distal pulses.   Pulmonary/Chest: Effort normal and breath sounds normal. No respiratory distress.  Musculoskeletal: She exhibits no edema.  Lymphadenopathy:    She has no cervical adenopathy.  Neurological: She is alert  and oriented to person, place, and time.  Skin: Skin is warm and dry. She is not diaphoretic. No erythema.  Psychiatric: Her speech is normal. Cognition and memory are normal. She exhibits a depressed mood.      BP 140/88 (BP Location: Right Arm, Patient Position: Sitting, Cuff Size: Large)   Pulse (!) 102   Temp 98.4 F (36.9 C) (Oral)   Resp 18   Ht 5\' 3"  (1.6 m)   Wt 291 lb (132 kg)   SpO2 98%   BMI 51.55 kg/m      Assessment & Plan:  Needs tsh at next visit. 1. Fibromyalgia   2. Other migraine without status migrainosus, not intractable   3. Chronic pain     Meds ordered this encounter  Medications  . traMADol (ULTRAM) 50 MG tablet    Sig: Take 1 tablet (50 mg total) by mouth 4 (four) times daily.    Dispense:  120 tablet    Refill:  2  . DISCONTD: telmisartan-hydrochlorothiazide (MICARDIS HCT) 80-12.5 MG tablet    Sig: Take 1 tablet by mouth daily.    Dispense:  90 tablet    Refill:  3  . DISCONTD: SUMAtriptan (IMITREX) 100 MG tablet    Sig: Take 1 tablet (100 mg total) by mouth every 2 (two) hours as needed for migraine. No more than 2 doses in 24 hours.    Dispense:  4 tablet    Refill:  0  . pregabalin (LYRICA) 150 MG capsule    Sig: Take 1 capsule (150 mg total) by mouth 3 (three) times daily.    Dispense:  270 capsule    Refill:  1  . DISCONTD: ondansetron (ZOFRAN) 4 MG tablet    Sig: Take 1 tablet (4 mg total) by mouth every 6 (six) hours.     Dispense:  12 tablet    Refill:  0  . DISCONTD: montelukast (SINGULAIR) 10 MG tablet    Sig: Take 1 tablet (10 mg total) by mouth every morning.    Dispense:  90 tablet    Refill:  1  . DISCONTD: levothyroxine (SYNTHROID, LEVOTHROID) 75 MCG tablet    Sig: Take 1 tablet (75 mcg total) by mouth daily before breakfast.    Dispense:  90 tablet    Refill:  1  . DISCONTD: dicyclomine (BENTYL) 20 MG tablet    Sig: Take 1 tablet (20 mg total) by mouth 3 (three) times daily before meals.    Dispense:  90 tablet    Refill:  0  . carisoprodol (SOMA) 350 MG tablet    Sig: Take 1 tablet (350 mg total) by mouth 4 (four) times daily as needed for muscle spasms.    Dispense:  120 tablet    Refill:  2  . amphetamine-dextroamphetamine (ADDERALL) 10 MG tablet    Sig: Take 1 tablet (10 mg total) by mouth 2 (two) times daily.    Dispense:  60 tablet    Refill:  0  . amphetamine-dextroamphetamine (ADDERALL) 10 MG tablet    Sig: Take 1 tablet (10 mg total) by mouth 2 (two) times daily with a meal.    Dispense:  60 tablet    Refill:  0    May fill 56 days after date written  . amphetamine-dextroamphetamine (ADDERALL) 10 MG tablet    Sig: Take 1 tablet (10 mg total) by mouth 2 (two) times daily.    Dispense:  60 tablet    Refill:  0  May fill 28 days after date written  . DISCONTD: Fluticasone-Salmeterol (ADVAIR DISKUS) 250-50 MCG/DOSE AEPB    Sig: Inhale 1 puff into the lungs 2 (two) times daily as needed.    Dispense:  60 each    Refill:  5  . telmisartan-hydrochlorothiazide (MICARDIS HCT) 80-12.5 MG tablet    Sig: Take 1 tablet by mouth daily.    Dispense:  90 tablet    Refill:  3  . SUMAtriptan (IMITREX) 100 MG tablet    Sig: Take 1 tablet (100 mg total) by mouth every 2 (two) hours as needed for migraine. No more than 2 doses in 24 hours.    Dispense:  8 tablet    Refill:  3  . ondansetron (ZOFRAN) 4 MG tablet    Sig: Take 1 tablet (4 mg total) by mouth every 6 (six) hours.    Dispense:   60 tablet    Refill:  1  . montelukast (SINGULAIR) 10 MG tablet    Sig: Take 1 tablet (10 mg total) by mouth every morning.    Dispense:  90 tablet    Refill:  3  . DISCONTD: levothyroxine (SYNTHROID, LEVOTHROID) 75 MCG tablet    Sig: Take 1 tablet (75 mcg total) by mouth daily before breakfast.    Dispense:  90 tablet    Refill:  1  . Fluticasone-Salmeterol (ADVAIR DISKUS) 250-50 MCG/DOSE AEPB    Sig: Inhale 1 puff into the lungs 2 (two) times daily as needed.    Dispense:  60 each    Refill:  5  . dicyclomine (BENTYL) 20 MG tablet    Sig: Take 1 tablet (20 mg total) by mouth 3 (three) times daily before meals. As needed for bowel cramping    Dispense:  90 tablet    Refill:  0  . albuterol (VENTOLIN HFA) 108 (90 Base) MCG/ACT inhaler    Sig: INHALE TWO PUFFS BY MOUTH EVERY SIX HOURS AS NEEDED    Dispense:  1 Inhaler    Refill:  11    Norberto Sorenson, M.D.  [facility] [address] [place], [place] [address] [phone] phone [phone] fax  07/12/16 7:22 AM

## 2016-06-27 ENCOUNTER — Other Ambulatory Visit: Payer: Self-pay | Admitting: Family Medicine

## 2016-07-07 ENCOUNTER — Other Ambulatory Visit: Payer: Self-pay | Admitting: Family Medicine

## 2016-07-07 DIAGNOSIS — M797 Fibromyalgia: Secondary | ICD-10-CM

## 2016-08-02 ENCOUNTER — Other Ambulatory Visit: Payer: Self-pay | Admitting: Family Medicine

## 2016-08-06 ENCOUNTER — Other Ambulatory Visit: Payer: Self-pay

## 2016-08-06 MED ORDER — CITALOPRAM HYDROBROMIDE 20 MG PO TABS: 20.0000 mg | ORAL_TABLET | Freq: Every day | ORAL | 0 refills | Status: DC

## 2016-08-29 ENCOUNTER — Other Ambulatory Visit: Payer: Self-pay | Admitting: Family Medicine

## 2016-09-02 ENCOUNTER — Other Ambulatory Visit: Payer: Self-pay | Admitting: Family Medicine

## 2016-09-03 NOTE — Telephone Encounter (Signed)
Dr Clelia CroftShaw, pt saw you for check up in Sept and has appt sched for next month, but I don't see this med discussed recently. Do you want to RF?

## 2016-09-27 ENCOUNTER — Other Ambulatory Visit: Payer: Self-pay | Admitting: Family Medicine

## 2016-10-18 ENCOUNTER — Encounter: Payer: Self-pay | Admitting: Family Medicine

## 2016-10-18 ENCOUNTER — Ambulatory Visit (INDEPENDENT_AMBULATORY_CARE_PROVIDER_SITE_OTHER): Payer: 59 | Admitting: Family Medicine

## 2016-10-18 VITALS — BP 134/74 | HR 94 | Temp 98.2°F | Resp 18 | Ht 63.0 in | Wt 281.4 lb

## 2016-10-18 DIAGNOSIS — G894 Chronic pain syndrome: Secondary | ICD-10-CM | POA: Diagnosis not present

## 2016-10-18 DIAGNOSIS — J45909 Unspecified asthma, uncomplicated: Secondary | ICD-10-CM

## 2016-10-18 DIAGNOSIS — E785 Hyperlipidemia, unspecified: Secondary | ICD-10-CM | POA: Diagnosis not present

## 2016-10-18 DIAGNOSIS — Z5181 Encounter for therapeutic drug level monitoring: Secondary | ICD-10-CM | POA: Diagnosis not present

## 2016-10-18 DIAGNOSIS — I10 Essential (primary) hypertension: Secondary | ICD-10-CM | POA: Diagnosis not present

## 2016-10-18 DIAGNOSIS — K589 Irritable bowel syndrome without diarrhea: Secondary | ICD-10-CM | POA: Diagnosis not present

## 2016-10-18 DIAGNOSIS — F988 Other specified behavioral and emotional disorders with onset usually occurring in childhood and adolescence: Secondary | ICD-10-CM

## 2016-10-18 DIAGNOSIS — E559 Vitamin D deficiency, unspecified: Secondary | ICD-10-CM

## 2016-10-18 DIAGNOSIS — R7303 Prediabetes: Secondary | ICD-10-CM

## 2016-10-18 DIAGNOSIS — M797 Fibromyalgia: Secondary | ICD-10-CM

## 2016-10-18 DIAGNOSIS — E039 Hypothyroidism, unspecified: Secondary | ICD-10-CM

## 2016-10-18 MED ORDER — AMPHETAMINE-DEXTROAMPHETAMINE 10 MG PO TABS: 10.0000 mg | ORAL_TABLET | Freq: Two times a day (BID) | ORAL | 0 refills | Status: DC

## 2016-10-18 MED ORDER — PREGABALIN 150 MG PO CAPS: 150.0000 mg | ORAL_CAPSULE | Freq: Three times a day (TID) | ORAL | 1 refills | Status: DC

## 2016-10-18 MED ORDER — TRAMADOL HCL 50 MG PO TABS: 50.0000 mg | ORAL_TABLET | Freq: Four times a day (QID) | ORAL | 2 refills | Status: DC

## 2016-10-18 MED ORDER — CARISOPRODOL 350 MG PO TABS: 350.0000 mg | ORAL_TABLET | Freq: Four times a day (QID) | ORAL | 2 refills | Status: DC | PRN

## 2016-10-18 NOTE — Patient Instructions (Signed)
     IF you received an x-ray today, you will receive an invoice from [facility]. Please contact  Radiology at [phone] with questions or concerns regarding your invoice.   IF you received labwork today, you will receive an invoice from [facility]. Please contact [facility] at [phone] with questions or concerns regarding your invoice.   Our billing staff will not be able to assist you with questions regarding bills from these companies.  You will be contacted with the lab results as soon as they are available. The fastest way to get your results is to activate your My Chart account. Instructions are located on the last page of this paperwork. If you have not heard from us regarding the results in 2 weeks, please contact this office.     

## 2016-10-18 NOTE — Progress Notes (Signed)
Subjective:    Patient ID: Gloria Carter, female    DOB: March 21, 1966, 51 y.o.   MRN: [id number] Chief Complaint  Patient presents with  . Follow-up    HPI  She is having to do more about house she hasn't been calling him for help. She is stopped eating fast food and bringing her lunch to work.  Her husband moved out, cat passed away.  Migraine every few months but hasn't had any in the past several pmos - maybe worse with all of the seasonal allergies  Was able to get it gone. Tries ot do more bland food.    Hasn't  Checked blood pressure this past few mos. tol meds Urinary urgency which has been worsening over the past year but not waking up over the night to void. Is on hctz 12.5 and drinks a lot of water throughout the day. occ GERD with salds  Will occ forget the evening dose of lyrica but will get in the morning and mid-day. Ran out of zofran but would like a refill  Lots of drainage and cough prod so back on mucinex with , singulair, and zyrtec. Takes adderall around 6:30 and 1:30 - no anxiety, heart racing,    taking bentyl as needed -1-2x/wk On adderal 7.4 qd twoards since 2013, increased to bid, then up to 10 bid 6 mos ago  She has seen therapist on/off throughout the year and many times prior so doesn't see the need now more than others. Helpful at times. Takes B compelx vit every day. And drinks hot tea, no coffee, rare sodas - none since surg in April.    Past Medical History:  Diagnosis Date  . ADD (attention deficit disorder)   . Allergy    ENVIRONMENTAL  . Arthritis    knees  . Asthma   . Depression   . Difficulty sleeping   . Dysphagia   . Fibromyalgia   . Fibromyalgia   . GERD (gastroesophageal reflux disease)   . Hernia of abdominal wall   . Hyperlipidemia   . Hypertension   . IBS (irritable bowel syndrome)   . Migraine   . Numbness and tingling in hands   . Obesity   . Swelling of both ankles   . Thyroid disease    Past Surgical  History:  Procedure Laterality Date  . INSERTION OF MESH N/A 03/28/2014   Procedure: INSERTION OF MESH;  Surgeon: Velora Heckler, MD;  Location: [facility];  Service: General;  Laterality: N/A;  . TONSILECTOMY/ADENOIDECTOMY WITH MYRINGOTOMY    . VENTRAL HERNIA REPAIR N/A 03/28/2014   Procedure: LAPAROSCOPIC VENTRAL HERNIA REPAIR WITH MESH;  Surgeon: Velora Heckler, MD;  Location: [facility];  Service: General;  Laterality: N/A;  . WISDOM TOOTH EXTRACTION     Current Outpatient Prescriptions on File Prior to Visit  Medication Sig Dispense Refill  . albuterol (PROVENTIL HFA) 108 (90 BASE) MCG/ACT inhaler INHALE TWO PUFFS BY MOUTH EVERY SIX HOURS AS NEEDED (Patient taking differently: Inhale 2 puffs into the lungs every 6 (six) hours as needed for wheezing or shortness of breath. INHALE TWO PUFFS BY MOUTH EVERY SIX HOURS AS NEEDED) 18 Inhaler 11  . albuterol (VENTOLIN HFA) 108 (90 Base) MCG/ACT inhaler INHALE TWO PUFFS BY MOUTH EVERY SIX HOURS AS NEEDED 1 Inhaler 11  . azelastine (OPTIVAR) 0.05 % ophthalmic solution PLACE TWO DRIPS INTO BOTH EYES TWICE DAILY 6 mL 5  . B Complex-C-Folic Acid (B-COMPLEX BALANCED PO) Take 1 tablet by  mouth daily.     . calcium carbonate (OS-CAL) 600 MG TABS Take 600 mg by mouth daily.    . cetirizine (ZYRTEC) 10 MG tablet Take 10 mg by mouth daily as needed for allergies.     . cholecalciferol (VITAMIN D) 400 UNITS TABS Take 400 Units by mouth daily.    . citalopram (CELEXA) 20 MG tablet Take 1 tablet (20 mg total) by mouth daily. 90 tablet 0  . dicyclomine (BENTYL) 20 MG tablet Take 1 tablet (20 mg total) by mouth 3 (three) times daily before meals. As needed for bowel cramping 90 tablet 0  . esomeprazole (NEXIUM) 40 MG capsule TAKE ONE CAPSULE BY MOUTH ONE TIME DAILY FILL 09/07/15 90 capsule 0  . Fluticasone-Salmeterol (ADVAIR DISKUS) 250-50 MCG/DOSE AEPB Inhale 1 puff into the lungs 2 (two) times daily as needed. 60 each 5  . montelukast (SINGULAIR) 10 MG tablet Take 1 tablet  (10 mg total) by mouth every morning. 90 tablet 3  . Multiple Vitamin (MULTIVITAMIN) tablet Take 1 tablet by mouth daily.    . ondansetron (ZOFRAN) 4 MG tablet Take 1 tablet (4 mg total) by mouth every 6 (six) hours. 60 tablet 1  . Polyethyl Glycol-Propyl Glycol (SYSTANE) 0.4-0.3 % SOLN Apply 1 drop to eye 3 (three) times daily as needed (dry eyes).    . ranitidine (ZANTAC) 150 MG tablet Take 150 mg by mouth 2 (two) times daily as needed for heartburn.    . SUMAtriptan (IMITREX) 100 MG tablet Take 1 tablet (100 mg total) by mouth every 2 (two) hours as needed for migraine. No more than 2 doses in 24 hours. 8 tablet 3  . SYNTHROID 75 MCG tablet TAKE 1 TABLET (75 MCG TOTAL) BY MOUTH DAILY BEFORE BREAKFAST. 90 tablet 0  . telmisartan-hydrochlorothiazide (MICARDIS HCT) 80-12.5 MG tablet Take 1 tablet by mouth daily. 90 tablet 3  . vitamin C (ASCORBIC ACID) 500 MG tablet Take 1,000 mg by mouth daily.    . [DISCONTINUED] hyoscyamine (CYSTOSPAZ) 0.15 MG tablet Take 0.15 mg by mouth every 4 (four) hours as needed.     No current facility-administered medications on file prior to visit.    Allergies  Allergen Reactions  . Soap     Any soaps with fragrance cause migraines  . Sulfa Antibiotics Nausea And Vomiting   Family History  Problem Relation Age of Onset  . COPD Mother   . Fibromyalgia Mother   . Heart disease Father   . Colon polyps Maternal Grandmother   . Diabetes Paternal Grandfather   . Heart disease Paternal Grandfather   . Colon polyps Maternal Aunt   . Colon polyps      ? father  . Diabetes      MGGF, MGGM  . Diabetes Maternal Uncle   . Aneurysm Maternal Aunt   . Stroke Sister   . Kidney disease Maternal Aunt   . Fibromyalgia Sister   . Fibromyalgia      cousin  . Colon cancer Neg Hx    Social History   Social History  . Marital status: Legally Separated    Spouse name: N/A  . Number of children: 0  . Years of education: N/A   Occupational History  . Social Worker  [employer]   Social History Main Topics  . Smoking status: Never Smoker  . Smokeless tobacco: Never Used  . Alcohol use No  . Drug use: No  . Sexual activity: Yes   Other Topics Concern  .  None   Social History Narrative   Married. Education: [employer].    [facility] 10/18/2016 06/24/2016 03/18/2016 02/28/2016 02/19/2016  Decreased Interest 0 0 0 0 0  Down, Depressed, Hopeless 0 0 0 0 0  PHQ - 2 Score 0 0 0 0 0    Review of Systems  Constitutional: Positive for activity change, appetite change and fatigue. Negative for chills and fever.  Gastrointestinal: Negative for abdominal pain, constipation, diarrhea, nausea and vomiting.  Genitourinary: Negative for decreased urine volume, difficulty urinating, frequency and urgency.  Musculoskeletal: Positive for arthralgias, back pain, gait problem, joint swelling and myalgias.  Neurological: Negative for weakness and numbness.  Hematological: Negative for adenopathy. Does not bruise/bleed easily.  Psychiatric/Behavioral: Positive for decreased concentration and dysphoric mood. Negative for agitation, behavioral problems and confusion. The patient is not hyperactive.        Objective:   Physical Exam  Constitutional: She is oriented to person, place, and time. She appears well-developed and well-nourished. No distress.  HENT:  Head: Normocephalic and atraumatic.  Right Ear: External ear normal.  Left Ear: External ear normal.  Eyes: Conjunctivae are normal. No scleral icterus.  Neck: Normal range of motion. Neck supple. No thyromegaly present.  Cardiovascular: Normal rate, regular rhythm, normal heart sounds and intact distal pulses.   Pulmonary/Chest: Effort normal and breath sounds normal. No respiratory distress.  Musculoskeletal: She exhibits no edema.  Lymphadenopathy:    She has no cervical adenopathy.  Neurological: She is alert and oriented to person, place, and time.  Skin: Skin is warm and dry. She is not  diaphoretic. No erythema.  Psychiatric: She has a normal mood and affect. Her behavior is normal.      BP 134/74 (BP Location: Right Arm, Patient Position: Sitting, Cuff Size: Large)   Pulse 94   Temp 98.2 F (36.8 C) (Oral)   Resp 18   Ht 5\' 3"  (1.6 m)   Wt 281 lb 6.4 oz (127.6 kg)   SpO2 94%   BMI 49.85 kg/m      Assessment & Plan:  Last lipid panel November 2014 showed LDL of 153 with a non-HDL of 186 1. Essential hypertension   2. Chronic asthma without complication, unspecified asthma severity, unspecified whether persistent   3. Irritable bowel syndrome, unspecified type   4. Hypothyroidism, unspecified type   5. Prediabetes   6. Obesity, morbid, BMI 50 or higher (HCC)   7. Hyperlipidemia, unspecified hyperlipidemia type - not fasting today.  8. Fibromyalgia   9. Encounter for medication monitoring   10. Chronic pain syndrome   11. Attention deficit disorder (ADD) without hyperactivity   12. Vitamin D deficiency    Ok to refill any of non-controlled meds on list x 6 mos whenever requested. F/u in 3 mos for controlled med refills. No med made changes today.  Really encouraged by pt's weight loss - she has lost 10 lbs since her visit 4 mos prior and her weight has not been this low since 2014 - really encouraged pt to cont to challenge herself by finding little ways to tweak her diet (e.g. 1 mini-donut less one week, cook more at home, etc)  Encouraged pt to really challenge herself to get involved in some social outlet - bookstore lecture, Engineering geologistlibrary book club, coffeehouse trivia team - since newly divorced, cat died this yr, pt's parents are supportive and she will sometimes confide in coworkers but otherwise does not seem to have any other social support - no  friends/sibblings - and hobbies are isolating (tv).  Orders Placed This Encounter  Procedures  . TSH  . Hemoglobin A1c  . Comprehensive metabolic panel  . VITAMIN D 25 Hydroxy (Vit-D Deficiency, Fractures)  .  Vitamin B12  . Sedimentation Rate  . C-reactive protein    Meds ordered this encounter  Medications  . amphetamine-dextroamphetamine (ADDERALL) 10 MG tablet    Sig: Take 1 tablet (10 mg total) by mouth 2 (two) times daily with a meal.    Dispense:  60 tablet    Refill:  0    May fill 56 days after date written  . amphetamine-dextroamphetamine (ADDERALL) 10 MG tablet    Sig: Take 1 tablet (10 mg total) by mouth 2 (two) times daily.    Dispense:  60 tablet    Refill:  0    May fill 28 days after date written  . amphetamine-dextroamphetamine (ADDERALL) 10 MG tablet    Sig: Take 1 tablet (10 mg total) by mouth 2 (two) times daily.    Dispense:  60 tablet    Refill:  0  . traMADol (ULTRAM) 50 MG tablet    Sig: Take 1 tablet (50 mg total) by mouth 4 (four) times daily.    Dispense:  120 tablet    Refill:  2  . pregabalin (LYRICA) 150 MG capsule    Sig: Take 1 capsule (150 mg total) by mouth 3 (three) times daily.    Dispense:  270 capsule    Refill:  1  . carisoprodol (SOMA) 350 MG tablet    Sig: Take 1 tablet (350 mg total) by mouth 4 (four) times daily as needed for muscle spasms.    Dispense:  120 tablet    Refill:  2      Norberto Sorenson, M.D.  [facility] [address] [place], [place] [address] [phone] phone [phone] fax  10/18/16 11:13 PM

## 2016-10-19 LAB — COMPREHENSIVE METABOLIC PANEL
ALBUMIN: 4.2 g/dL (ref 3.5–5.5)
ALT: 22 IU/L (ref 0–32)
AST: 26 IU/L (ref 0–40)
Albumin/Globulin Ratio: 1.4 (ref 1.2–2.2)
Alkaline Phosphatase: 114 IU/L (ref 39–117)
BUN / CREAT RATIO: 15 (ref 9–23)
BUN: 10 mg/dL (ref 6–24)
Bilirubin Total: 0.2 mg/dL (ref 0.0–1.2)
CALCIUM: 9.5 mg/dL (ref 8.7–10.2)
CO2: 29 mmol/L (ref 18–29)
CREATININE: 0.66 mg/dL (ref 0.57–1.00)
Chloride: 95 mmol/L — ABNORMAL LOW (ref 96–106)
GFR calc Af Amer: 117 mL/min/{1.73_m2} (ref >59)
GFR, EST NON AFRICAN AMERICAN: 102 mL/min/{1.73_m2} (ref >59)
GLOBULIN, TOTAL: 3 g/dL (ref 1.5–4.5)
Glucose: 99 mg/dL (ref 65–99)
Potassium: 3.3 mmol/L — ABNORMAL LOW (ref 3.5–5.2)
SODIUM: 140 mmol/L (ref 134–144)
Total Protein: 7.2 g/dL (ref 6.0–8.5)

## 2016-10-19 LAB — SEDIMENTATION RATE: SED RATE: 39 mm/h (ref 0–40)

## 2016-10-19 LAB — HEMOGLOBIN A1C
Est. average glucose Bld gHb Est-mCnc: 114 mg/dL
Hgb A1c MFr Bld: 5.6 % (ref 4.8–5.6)

## 2016-10-19 LAB — TSH: TSH: 0.775 u[IU]/mL (ref 0.450–4.500)

## 2016-10-19 LAB — C-REACTIVE PROTEIN: CRP: 12.5 mg/L — ABNORMAL HIGH (ref 0.0–4.9)

## 2016-10-19 LAB — VITAMIN D 25 HYDROXY (VIT D DEFICIENCY, FRACTURES): VIT D 25 HYDROXY: 20.8 ng/mL — AB (ref 30.0–100.0)

## 2016-10-19 LAB — VITAMIN B12: VITAMIN B 12: 440 pg/mL (ref 232–1245)

## 2016-11-05 ENCOUNTER — Other Ambulatory Visit: Payer: Self-pay | Admitting: Family Medicine

## 2016-11-05 NOTE — Telephone Encounter (Signed)
Meds ordered this encounter  Medications  . esomeprazole (NEXIUM) 40 MG capsule    Sig: TAKE ONE CAPSULE BY MOUTH ONE TIME DAILY    Dispense:  90 capsule    Refill:  0   Patient notified via My Chart.

## 2016-11-15 ENCOUNTER — Ambulatory Visit (INDEPENDENT_AMBULATORY_CARE_PROVIDER_SITE_OTHER): Payer: 59 | Admitting: Physician Assistant

## 2016-11-15 VITALS — BP 144/92 | HR 83 | Temp 98.1°F | Resp 20 | Ht 63.0 in | Wt 274.0 lb

## 2016-11-15 DIAGNOSIS — R6889 Other general symptoms and signs: Secondary | ICD-10-CM | POA: Diagnosis not present

## 2016-11-15 MED ORDER — PROMETHAZINE-CODEINE 6.25-10 MG/5ML PO SYRP: 5.0000 mL | ORAL_SOLUTION | Freq: Four times a day (QID) | ORAL | Status: DC | PRN

## 2016-11-15 MED ORDER — PROMETHAZINE-CODEINE 6.25-10 MG/5ML PO SYRP: 5.0000 mL | ORAL_SOLUTION | Freq: Four times a day (QID) | ORAL | 0 refills | Status: DC | PRN

## 2016-11-15 NOTE — Progress Notes (Signed)
Patient ID: Gloria Carter, female    DOB: 1966-05-11, 51 y.o.   MRN: [id number]  PCP: Norberto Sorenson, MD  Chief Complaint  Patient presents with  . Cough  . Fever  . Diarrhea  . Nasal Congestion    Subjective:   Presents for evaluation of fever, cough, nasal congestion, and diarrhea.  Pt is a 51 yo caucasian female with a history of asthma who presents with 5 days of subjective fever, fatigue cough, nasal congestion, diarrhea, and generalized muscle aches. Pt states that she would have come sooner, but didn't have a ride and didn't want to expose her mother to possible flu. Pt states that she had a flu shot this year. Pt states that she has gotten better over the weekend, but the cough keeps her awake at night. She has also had to use her albuterol inhaler up to 4 times/day due to wheezing. Humidifier has helped. Has not taken anything OTC for relief of her symptoms. Denies rashes, hemoptysis, SOB, abdominal pain, nausea, vomiting, constipation, or dysuria.  Review of Systems See HPI.    Patient Active Problem List   Diagnosis Date Noted  . Vitamin D deficiency 06/09/2015  . Degeneration of lumbar or lumbosacral intervertebral disc 05/22/2015  . Right lumbar radiculopathy 05/22/2015  . Incarcerated ventral hernia 03/28/2014  . Obesity, morbid, BMI 50 or higher (HCC) 02/15/2014  . Prediabetes 09/08/2013  . Hypothyroidism 08/31/2013  . Asthma, chronic 08/31/2013  . Upper airway cough syndrome 08/31/2013  . Carpal tunnel syndrome 06/01/2013  . Wrist tendonitis 06/01/2013  . Encounter for medication monitoring 12/01/2012  . HTN (hypertension) 08/13/2012  . BMI 45.0-49.9, adult (HCC) 08/13/2012  . Hyperlipidemia 08/13/2012  . IBS (irritable bowel syndrome) 08/13/2012  . ADD (attention deficit disorder) 02/07/2012  . GERD (gastroesophageal reflux disease) 02/07/2012  . Migraine 02/07/2012  . Dysphagia 02/07/2012  . Hernia of abdominal wall 02/07/2012  . RAD (reactive  airway disease) 12/20/2011  . Fibromyalgia 12/20/2011  . Chronic pain 12/20/2011     Prior to Admission medications   Medication Sig Start Date End Date Taking? Authorizing Provider  albuterol (PROVENTIL HFA) 108 (90 BASE) MCG/ACT inhaler INHALE TWO PUFFS BY MOUTH EVERY SIX HOURS AS NEEDED Patient taking differently: Inhale 2 puffs into the lungs every 6 (six) hours as needed for wheezing or shortness of breath. INHALE TWO PUFFS BY MOUTH EVERY SIX HOURS AS NEEDED 08/21/15  Yes Sherren Mocha, MD  albuterol (VENTOLIN HFA) 108 (90 Base) MCG/ACT inhaler INHALE TWO PUFFS BY MOUTH EVERY SIX HOURS AS NEEDED 06/24/16  Yes Sherren Mocha, MD  amphetamine-dextroamphetamine (ADDERALL) 10 MG tablet Take 1 tablet (10 mg total) by mouth 2 (two) times daily with a meal. 10/18/16  Yes Sherren Mocha, MD  amphetamine-dextroamphetamine (ADDERALL) 10 MG tablet Take 1 tablet (10 mg total) by mouth 2 (two) times daily. 10/18/16  Yes Sherren Mocha, MD  amphetamine-dextroamphetamine (ADDERALL) 10 MG tablet Take 1 tablet (10 mg total) by mouth 2 (two) times daily. 10/18/16  Yes Sherren Mocha, MD  azelastine (OPTIVAR) 0.05 % ophthalmic solution PLACE TWO DRIPS INTO BOTH EYES TWICE DAILY 09/04/16  Yes Sherren Mocha, MD  B Complex-C-Folic Acid (B-COMPLEX BALANCED PO) Take 1 tablet by mouth daily.    Yes Historical Provider, MD  calcium carbonate (OS-CAL) 600 MG TABS Take 600 mg by mouth daily.   Yes Historical Provider, MD  carisoprodol (SOMA) 350 MG tablet Take 1 tablet (350 mg total) by mouth 4 (four)  times daily as needed for muscle spasms. 10/18/16  Yes Sherren MochaEva N Shaw, MD  cetirizine (ZYRTEC) 10 MG tablet Take 10 mg by mouth daily as needed for allergies.    Yes Historical Provider, MD  cholecalciferol (VITAMIN D) 400 UNITS TABS Take 400 Units by mouth daily.   Yes Historical Provider, MD  citalopram (CELEXA) 20 MG tablet Take 1 tablet (20 mg total) by mouth daily. 08/06/16  Yes Sherren MochaEva N Shaw, MD  dicyclomine (BENTYL) 20 MG tablet Take 1 tablet (20 mg  total) by mouth 3 (three) times daily before meals. As needed for bowel cramping 06/24/16  Yes Sherren MochaEva N Shaw, MD  esomeprazole (NEXIUM) 40 MG capsule TAKE ONE CAPSULE BY MOUTH ONE TIME DAILY FILL 09/07/15 11/05/16  Yes Chelle Jeffery, PA-C  Fluticasone-Salmeterol (ADVAIR DISKUS) 250-50 MCG/DOSE AEPB Inhale 1 puff into the lungs 2 (two) times daily as needed. 06/24/16  Yes Sherren MochaEva N Shaw, MD  montelukast (SINGULAIR) 10 MG tablet Take 1 tablet (10 mg total) by mouth every morning. 06/24/16  Yes Sherren MochaEva N Shaw, MD  Multiple Vitamin (MULTIVITAMIN) tablet Take 1 tablet by mouth daily.   Yes Historical Provider, MD  ondansetron (ZOFRAN) 4 MG tablet Take 1 tablet (4 mg total) by mouth every 6 (six) hours. 06/24/16  Yes Sherren MochaEva N Shaw, MD  Polyethyl Glycol-Propyl Glycol (SYSTANE) 0.4-0.3 % SOLN Apply 1 drop to eye 3 (three) times daily as needed (dry eyes).   Yes Historical Provider, MD  pregabalin (LYRICA) 150 MG capsule Take 1 capsule (150 mg total) by mouth 3 (three) times daily. 10/18/16  Yes Sherren MochaEva N Shaw, MD  ranitidine (ZANTAC) 150 MG tablet Take 150 mg by mouth 2 (two) times daily as needed for heartburn.   Yes Historical Provider, MD  SUMAtriptan (IMITREX) 100 MG tablet Take 1 tablet (100 mg total) by mouth every 2 (two) hours as needed for migraine. No more than 2 doses in 24 hours. 06/24/16  Yes Sherren MochaEva N Shaw, MD  SYNTHROID 75 MCG tablet TAKE 1 TABLET (75 MCG TOTAL) BY MOUTH DAILY BEFORE BREAKFAST. 09/29/16  Yes Sherren MochaEva N Shaw, MD  telmisartan-hydrochlorothiazide (MICARDIS HCT) 80-12.5 MG tablet Take 1 tablet by mouth daily. 06/24/16  Yes Sherren MochaEva N Shaw, MD  traMADol (ULTRAM) 50 MG tablet Take 1 tablet (50 mg total) by mouth 4 (four) times daily. 10/18/16  Yes Sherren MochaEva N Shaw, MD  vitamin C (ASCORBIC ACID) 500 MG tablet Take 1,000 mg by mouth daily.   Yes Historical Provider, MD     Allergies  Allergen Reactions  . Soap     Any soaps with fragrance cause migraines  . Sulfa Antibiotics Nausea And Vomiting       Objective:  Physical  Exam HEENT: PERRLA. Throat is non-erythematous, no exudates. Ear canals clear bilaterally, TMs intact, non-bulging. No lymphadenopathy, no tenderness to palpation of neck. Pulm: Good respiratory effort. CTAB. No wheezes, rales, of rhonchi. CV: RRR. No M/R/G. Abd: Soft, non-tender, non-distended.     Assessment & Plan:   1. Flu-like symptoms Pt advised to drink plenty of water and rest. Pt advised to return if symptoms worsen.  - promethazine-codeine (PHENERGAN with CODEINE) 6.25-10 MG/5ML syrup 5 mL; Take 5 mLs by mouth every 6 (six) hours as needed for cough.  Georgiana SpinnerHannah Bradley Jonathyn Carothers, PA-S

## 2016-11-15 NOTE — Patient Instructions (Addendum)
   IF you received an x-ray today, you will receive an invoice from [facility]. Please contact [facility] at [phone] with questions or concerns regarding your invoice.   IF you received labwork today, you will receive an invoice from [facility]. Please contact [facility] at [phone] with questions or concerns regarding your invoice.   Our billing staff will not be able to assist you with questions regarding bills from these companies.  You will be contacted with the lab results as soon as they are available. The fastest way to get your results is to activate your My Chart account. Instructions are located on the last page of this paperwork. If you have not heard from us regarding the results in 2 weeks, please contact this office.      Influenza, Adult Influenza, more commonly known as "the flu," is a viral infection that primarily affects the respiratory tract. The respiratory tract includes organs that help you breathe, such as the lungs, nose, and throat. The flu causes many common cold symptoms, as well as a high fever and body aches. The flu spreads easily from person to person (is contagious). Getting a flu shot (influenza vaccination) every year is the best way to prevent influenza. What are the causes? Influenza is caused by a virus. You can catch the virus by:  Breathing in droplets from an infected person's cough or sneeze.  Touching something that was recently contaminated with the virus and then touching your mouth, nose, or eyes.  What increases the risk? The following factors may make you more likely to get the flu:  Not cleaning your hands frequently with soap and water or alcohol-based hand sanitizer.  Having close contact with many people during cold and flu season.  Touching your mouth, eyes, or nose without washing or sanitizing your hands first.  Not drinking enough fluids or not eating a healthy diet.  Not getting enough sleep or  exercise.  Being under a high amount of stress.  Not getting a yearly (annual) flu shot.  You may be at a higher risk of complications from the flu, such as a severe lung infection (pneumonia), if you:  Are over the age of 65.  Are pregnant.  Have a weakened disease-fighting system (immune system). You may have a weakened immune system if you: ? Have HIV or AIDS. ? Are undergoing chemotherapy. ? Aretaking medicines that reduce the activity of (suppress) the immune system.  Have a long-term (chronic) illness, such as heart disease, kidney disease, diabetes, or lung disease.  Have a liver disorder.  Are obese.  Have anemia.  What are the signs or symptoms? Symptoms of this condition typically last 4-10 days and may include:  Fever.  Chills.  Headache, body aches, or muscle aches.  Sore throat.  Cough.  Runny or congested nose.  Chest discomfort and cough.  Poor appetite.  Weakness or tiredness (fatigue).  Dizziness.  Nausea or vomiting.  How is this diagnosed? This condition may be diagnosed based on your medical history and a physical exam. Your health care provider may do a nose or throat swab test to confirm the diagnosis. How is this treated? If influenza is detected early, you can be treated with antiviral medicine that can reduce the length of your illness and the severity of your symptoms. This medicine may be given by mouth (orally) or through an IV tube that is inserted in one of your veins. The goal of treatment is to relieve symptoms by taking   care of yourself at home. This may include taking over-the-counter medicines, drinking plenty of fluids, and adding humidity to the air in your home. In some cases, influenza goes away on its own. Severe influenza or complications from influenza may be treated in a hospital. Follow these instructions at home:  Take over-the-counter and prescription medicines only as told by your health care provider.  Use a  cool mist humidifier to add humidity to the air in your home. This can make breathing easier.  Rest as needed.  Drink enough fluid to keep your urine clear or pale yellow.  Cover your mouth and nose when you cough or sneeze.  Wash your hands with soap and water often, especially after you cough or sneeze. If soap and water are not available, use hand sanitizer.  Stay home from work or school as told by your health care provider. Unless you are visiting your health care provider, try to avoid leaving home until your fever has been gone for 24 hours without the use of medicine.  Keep all follow-up visits as told by your health care provider. This is important. How is this prevented?  Getting an annual flu shot is the best way to avoid getting the flu. You may get the flu shot in late summer, fall, or winter. Ask your health care provider when you should get your flu shot.  Wash your hands often or use hand sanitizer often.  Avoid contact with people who are sick during cold and flu season.  Eat a healthy diet, drink plenty of fluids, get enough sleep, and exercise regularly. Contact a health care provider if:  You develop new symptoms.  You have: ? Chest pain. ? Diarrhea. ? A fever.  Your cough gets worse.  You produce more mucus.  You feel nauseous or you vomit. Get help right away if:  You develop shortness of breath or difficulty breathing.  Your skin or nails turn a bluish color.  You have severe pain or stiffness in your neck.  You develop a sudden headache or sudden pain in your face or ear.  You cannot stop vomiting. This information is not intended to replace advice given to you by your health care provider. Make sure you discuss any questions you have with your health care provider. Document Released: 09/24/2000 Document Revised: 03/04/2016 Document Reviewed: 07/22/2015 Elsevier Interactive Patient Education  2017 [employer]vier Inc[employer]  

## 2016-11-15 NOTE — Progress Notes (Signed)
Patient ID: Gloria Carter, female    DOB: 23-Nov-1965, 50 y.o.   MRN: [id number]  PCP: Norberto Sorenson, MD  Chief Complaint  Patient presents with  . Cough  . Fever  . Diarrhea  . Nasal Congestion    Subjective:   Presents for evaluation of 5 days of illness.  She reports fever, cough, nasal congestion and diarrhea. Muscle aches all over.  She did receive a seasonal flu vaccine. Cough keeps her awake at night. Using albuterol rescue inhaler 4x/day for wheezing. Humidifier in her bedroom has helped some. Has tried no OTC products to help to reduce her symptoms.  No CP, SOB, HA, GI/GU symptoms.    Review of Systems As above.    Patient Active Problem List   Diagnosis Date Noted  . Vitamin D deficiency 06/09/2015  . Degeneration of lumbar or lumbosacral intervertebral disc 05/22/2015  . Right lumbar radiculopathy 05/22/2015  . Incarcerated ventral hernia 03/28/2014  . Obesity, morbid, BMI 50 or higher (HCC) 02/15/2014  . Prediabetes 09/08/2013  . Hypothyroidism 08/31/2013  . Asthma, chronic 08/31/2013  . Upper airway cough syndrome 08/31/2013  . Carpal tunnel syndrome 06/01/2013  . Wrist tendonitis 06/01/2013  . Encounter for medication monitoring 12/01/2012  . HTN (hypertension) 08/13/2012  . BMI 45.0-49.9, adult (HCC) 08/13/2012  . Hyperlipidemia 08/13/2012  . IBS (irritable bowel syndrome) 08/13/2012  . ADD (attention deficit disorder) 02/07/2012  . GERD (gastroesophageal reflux disease) 02/07/2012  . Migraine 02/07/2012  . Dysphagia 02/07/2012  . Hernia of abdominal wall 02/07/2012  . RAD (reactive airway disease) 12/20/2011  . Fibromyalgia 12/20/2011  . Chronic pain 12/20/2011     Prior to Admission medications   Medication Sig Start Date End Date Taking? Authorizing Provider  albuterol (PROVENTIL HFA) 108 (90 BASE) MCG/ACT inhaler INHALE TWO PUFFS BY MOUTH EVERY SIX HOURS AS NEEDED Patient taking differently: Inhale 2 puffs into the lungs every  6 (six) hours as needed for wheezing or shortness of breath. INHALE TWO PUFFS BY MOUTH EVERY SIX HOURS AS NEEDED 08/21/15  Yes Sherren Mocha, MD  albuterol (VENTOLIN HFA) 108 (90 Base) MCG/ACT inhaler INHALE TWO PUFFS BY MOUTH EVERY SIX HOURS AS NEEDED 06/24/16  Yes Sherren Mocha, MD  amphetamine-dextroamphetamine (ADDERALL) 10 MG tablet Take 1 tablet (10 mg total) by mouth 2 (two) times daily with a meal. 10/18/16  Yes Sherren Mocha, MD  amphetamine-dextroamphetamine (ADDERALL) 10 MG tablet Take 1 tablet (10 mg total) by mouth 2 (two) times daily. 10/18/16  Yes Sherren Mocha, MD  amphetamine-dextroamphetamine (ADDERALL) 10 MG tablet Take 1 tablet (10 mg total) by mouth 2 (two) times daily. 10/18/16  Yes Sherren Mocha, MD  azelastine (OPTIVAR) 0.05 % ophthalmic solution PLACE TWO DRIPS INTO BOTH EYES TWICE DAILY 09/04/16  Yes Sherren Mocha, MD  B Complex-C-Folic Acid (B-COMPLEX BALANCED PO) Take 1 tablet by mouth daily.    Yes Historical Provider, MD  calcium carbonate (OS-CAL) 600 MG TABS Take 600 mg by mouth daily.   Yes Historical Provider, MD  carisoprodol (SOMA) 350 MG tablet Take 1 tablet (350 mg total) by mouth 4 (four) times daily as needed for muscle spasms. 10/18/16  Yes Sherren Mocha, MD  cetirizine (ZYRTEC) 10 MG tablet Take 10 mg by mouth daily as needed for allergies.    Yes Historical Provider, MD  cholecalciferol (VITAMIN D) 400 UNITS TABS Take 400 Units by mouth daily.   Yes Historical Provider, MD  citalopram (CELEXA) 20 MG tablet  Take 1 tablet (20 mg total) by mouth daily. 08/06/16  Yes Sherren Mocha, MD  dicyclomine (BENTYL) 20 MG tablet Take 1 tablet (20 mg total) by mouth 3 (three) times daily before meals. As needed for bowel cramping 06/24/16  Yes Sherren Mocha, MD  esomeprazole (NEXIUM) 40 MG capsule TAKE ONE CAPSULE BY MOUTH ONE TIME DAILY FILL 09/07/15 11/05/16  Yes Kayln Garceau, PA-C  Fluticasone-Salmeterol (ADVAIR DISKUS) 250-50 MCG/DOSE AEPB Inhale 1 puff into the lungs 2 (two) times daily as needed. 06/24/16   Yes Sherren Mocha, MD  montelukast (SINGULAIR) 10 MG tablet Take 1 tablet (10 mg total) by mouth every morning. 06/24/16  Yes Sherren Mocha, MD  Multiple Vitamin (MULTIVITAMIN) tablet Take 1 tablet by mouth daily.   Yes Historical Provider, MD  ondansetron (ZOFRAN) 4 MG tablet Take 1 tablet (4 mg total) by mouth every 6 (six) hours. 06/24/16  Yes Sherren Mocha, MD  Polyethyl Glycol-Propyl Glycol (SYSTANE) 0.4-0.3 % SOLN Apply 1 drop to eye 3 (three) times daily as needed (dry eyes).   Yes Historical Provider, MD  pregabalin (LYRICA) 150 MG capsule Take 1 capsule (150 mg total) by mouth 3 (three) times daily. 10/18/16  Yes Sherren Mocha, MD  ranitidine (ZANTAC) 150 MG tablet Take 150 mg by mouth 2 (two) times daily as needed for heartburn.   Yes Historical Provider, MD  SUMAtriptan (IMITREX) 100 MG tablet Take 1 tablet (100 mg total) by mouth every 2 (two) hours as needed for migraine. No more than 2 doses in 24 hours. 06/24/16  Yes Sherren Mocha, MD  SYNTHROID 75 MCG tablet TAKE 1 TABLET (75 MCG TOTAL) BY MOUTH DAILY BEFORE BREAKFAST. 09/29/16  Yes Sherren Mocha, MD  telmisartan-hydrochlorothiazide (MICARDIS HCT) 80-12.5 MG tablet Take 1 tablet by mouth daily. 06/24/16  Yes Sherren Mocha, MD  traMADol (ULTRAM) 50 MG tablet Take 1 tablet (50 mg total) by mouth 4 (four) times daily. 10/18/16  Yes Sherren Mocha, MD  vitamin C (ASCORBIC ACID) 500 MG tablet Take 1,000 mg by mouth daily.   Yes Historical Provider, MD     Allergies  Allergen Reactions  . Soap     Any soaps with fragrance cause migraines  . Sulfa Antibiotics Nausea And Vomiting       Objective:  Physical Exam  Constitutional: She is oriented to person, place, and time. She appears well-developed and well-nourished. She is active and cooperative. No distress.  BP (!) 144/92   Pulse 83   Temp 98.1 F (36.7 C) (Oral)   Resp 20   Ht 5\' 3"  (1.6 m)   Wt 274 lb (124.3 kg)   SpO2 97%   BMI 48.54 kg/m   HENT:  Head: Normocephalic and atraumatic.  Right Ear:  Hearing, tympanic membrane, external ear and ear canal normal.  Left Ear: Hearing, tympanic membrane, external ear and ear canal normal.  Nose: Nose normal.  Mouth/Throat: Uvula is midline, oropharynx is clear and moist and mucous membranes are normal.  Eyes: Conjunctivae are normal. No scleral icterus.  Neck: Normal range of motion. Neck supple. No thyromegaly present.  Cardiovascular: Normal rate, regular rhythm and normal heart sounds.   Pulses:      Radial pulses are 2+ on the right side, and 2+ on the left side.  Pulmonary/Chest: Effort normal and breath sounds normal.  Lymphadenopathy:       Head (right side): No tonsillar, no preauricular, no posterior auricular and no occipital adenopathy  present.       Head (left side): No tonsillar, no preauricular, no posterior auricular and no occipital adenopathy present.    She has no cervical adenopathy.       Right: No supraclavicular adenopathy present.       Left: No supraclavicular adenopathy present.  Neurological: She is alert and oriented to person, place, and time. No sensory deficit.  Skin: Skin is warm, dry and intact. No rash noted. No cyanosis or erythema. Nails show no clubbing.  Psychiatric: She has a normal mood and affect. Her speech is normal and behavior is normal.           Assessment & Plan:   1. Flu-like symptoms Not a candidate for Tamiflu. Supportive care.  Anticipatory guidance.  RTC if symptoms worsen/persist. - promethazine-codeine (PHENERGAN WITH CODEINE) 6.25-10 MG/5ML syrup; Take 5 mLs by mouth every 6 (six) hours as needed for cough.  Dispense: 120 mL; Refill: 0   Fernande Brashelle S. Ragena Fiola, PA-C Physician Assistant-Certified Primary Care at [facility]

## 2016-11-19 ENCOUNTER — Ambulatory Visit (INDEPENDENT_AMBULATORY_CARE_PROVIDER_SITE_OTHER): Payer: 59 | Admitting: Physician Assistant

## 2016-11-19 VITALS — BP 136/88 | HR 100 | Temp 97.3°F | Resp 18 | Ht 63.0 in | Wt 278.0 lb

## 2016-11-19 DIAGNOSIS — R6889 Other general symptoms and signs: Secondary | ICD-10-CM | POA: Diagnosis not present

## 2016-11-19 LAB — POCT CBC
GRANULOCYTE PERCENT: 71.9 % (ref 37–80)
HEMATOCRIT: 34.4 % — AB (ref 37.7–47.9)
Hemoglobin: 12.2 g/dL (ref 12.2–16.2)
Lymph, poc: 2 (ref 0.6–3.4)
MCH: 30.6 pg (ref 27–31.2)
MCHC: 35.6 g/dL — AB (ref 31.8–35.4)
MCV: 85.9 fL (ref 80–97)
MID (cbc): 0.5 (ref 0–0.9)
MPV: 8.6 fL (ref 0–99.8)
PLATELET COUNT, POC: 200 10*3/uL (ref 142–424)
POC GRANULOCYTE: 6.5 (ref 2–6.9)
POC LYMPH %: 22 % (ref 10–50)
POC MID %: 6.1 %M (ref 0–12)
RBC: 4 M/uL — AB (ref 4.04–5.48)
RDW, POC: 13.2 %
WBC: 9 10*3/uL (ref 4.6–10.2)

## 2016-11-19 MED ORDER — BENZONATATE 100 MG PO CAPS: 100.0000 mg | ORAL_CAPSULE | Freq: Three times a day (TID) | ORAL | 0 refills | Status: DC | PRN

## 2016-11-19 MED ORDER — PREDNISONE 20 MG PO TABS: ORAL_TABLET | ORAL | 0 refills | Status: DC

## 2016-11-19 NOTE — Progress Notes (Signed)
[facility] [address] [place] [address] [phone]  Date:  11/19/2016   Name:  Gloria Carter   DOB:  [birth year]   MRN:  [id number]  PCP:  Norberto Sorenson, MD    History of Present Illness:  Gloria Carter is a 51 y.o. female patient who presents to [facility] cough, sore throat, congestion.     --4 days ago she was seen by Chelle and dxd with flu-like symptoms.  This was day 4 of her symptoms, and supportive treatment was offered.  --she is feeling better than Monday but still feels bad.  She is having wheezing she is taking cough medicine and cold mist humidifer.  She has non-productive cough, greenish-yellow brown clumps of coughing.  Nasal congestion.    --she has not been checking her temperature, but has subjective fever.  She is having trouble breathing.   --coughing pain in her chest.    Patient Active Problem List   Diagnosis Date Noted  . Vitamin D deficiency 06/09/2015  . Degeneration of lumbar or lumbosacral intervertebral disc 05/22/2015  . Right lumbar radiculopathy 05/22/2015  . Incarcerated ventral hernia 03/28/2014  . Obesity, morbid, BMI 50 or higher (HCC) 02/15/2014  . Prediabetes 09/08/2013  . Hypothyroidism 08/31/2013  . Asthma, chronic 08/31/2013  . Upper airway cough syndrome 08/31/2013  . Carpal tunnel syndrome 06/01/2013  . Wrist tendonitis 06/01/2013  . Encounter for medication monitoring 12/01/2012  . HTN (hypertension) 08/13/2012  . BMI 45.0-49.9, adult (HCC) 08/13/2012  . Hyperlipidemia 08/13/2012  . IBS (irritable bowel syndrome) 08/13/2012  . ADD (attention deficit disorder) 02/07/2012  . GERD (gastroesophageal reflux disease) 02/07/2012  . Migraine 02/07/2012  . Dysphagia 02/07/2012  . Hernia of abdominal wall 02/07/2012  . RAD (reactive airway disease) 12/20/2011  . Fibromyalgia 12/20/2011  . Chronic pain 12/20/2011    Past Medical History:  Diagnosis Date  . ADD (attention deficit disorder)   . Allergy    ENVIRONMENTAL  . Arthritis    knees  . Asthma   . Depression   . Difficulty sleeping   . Dysphagia   . Fibromyalgia   . Fibromyalgia   . GERD (gastroesophageal reflux disease)   . Hernia of abdominal wall   . Hyperlipidemia   . Hypertension   . IBS (irritable bowel syndrome)   . Migraine   . Numbness and tingling in hands   . Obesity   . Swelling of both ankles   . Thyroid disease     Past Surgical History:  Procedure Laterality Date  . INSERTION OF MESH N/A 03/28/2014   Procedure: INSERTION OF MESH;  Surgeon: Velora Heckler, MD;  Location: [facility];  Service: General;  Laterality: N/A;  . TONSILECTOMY/ADENOIDECTOMY WITH MYRINGOTOMY    . VENTRAL HERNIA REPAIR N/A 03/28/2014   Procedure: LAPAROSCOPIC VENTRAL HERNIA REPAIR WITH MESH;  Surgeon: Velora Heckler, MD;  Location: [facility];  Service: General;  Laterality: N/A;  . WISDOM TOOTH EXTRACTION      Social History  Substance Use Topics  . Smoking status: Never Smoker  . Smokeless tobacco: Never Used  . Alcohol use No    Family History  Problem Relation Age of Onset  . COPD Mother   . Fibromyalgia Mother   . Heart disease Father   . Colon polyps Maternal Grandmother   . Diabetes Paternal Grandfather   . Heart disease Paternal Grandfather   . Colon polyps Maternal Aunt   . Colon polyps      ?  father  . Diabetes      MGGF, MGGM  . Diabetes Maternal Uncle   . Aneurysm Maternal Aunt   . Stroke Sister   . Kidney disease Maternal Aunt   . Fibromyalgia Sister   . Fibromyalgia      cousin  . Colon cancer Neg Hx     Allergies  Allergen Reactions  . Soap     Any soaps with fragrance cause migraines  . Sulfa Antibiotics Nausea And Vomiting    Medication list has been reviewed and updated.  ROS ROS otherwise unremarkable unless listed above.    Physical Examination: BP 136/88 (BP Location: Right Arm, Patient Position: Sitting, Cuff Size: Large)   Pulse 100   Temp 97.3 F (36.3 C) (Oral)   Resp 18   Ht 5\' 3"   (1.6 m)   Wt 278 lb (126.1 kg)   SpO2 95%   BMI 49.25 kg/m  Ideal Body Weight: Weight in (lb) to have BMI = 25: 140.8  Physical Exam  Constitutional: She is oriented to person, place, and time. She appears well-developed and well-nourished. No distress.  HENT:  Head: Normocephalic and atraumatic.  Right Ear: Tympanic membrane, external ear and ear canal normal.  Left Ear: Tympanic membrane, external ear and ear canal normal.  Nose: Mucosal edema and rhinorrhea present. Right sinus exhibits no maxillary sinus tenderness and no frontal sinus tenderness. Left sinus exhibits no maxillary sinus tenderness and no frontal sinus tenderness.  Mouth/Throat: No uvula swelling. No oropharyngeal exudate, posterior oropharyngeal edema or posterior oropharyngeal erythema.  Eyes: Conjunctivae and EOM are normal. Pupils are equal, round, and reactive to light.  Cardiovascular: Normal rate and regular rhythm.  Exam reveals no gallop, no distant heart sounds and no friction rub.   No murmur heard. Pulmonary/Chest: Effort normal. No respiratory distress. She has no decreased breath sounds. She has no wheezes. She has no rhonchi.  Lymphadenopathy:       Head (right side): No submandibular, no tonsillar, no preauricular and no posterior auricular adenopathy present.       Head (left side): No submandibular, no tonsillar, no preauricular and no posterior auricular adenopathy present.  Neurological: She is alert and oriented to person, place, and time.  Skin: She is not diaphoretic.  Psychiatric: She has a normal mood and affect. Her behavior is normal.   Results for orders placed or performed in visit on 11/19/16  POCT CBC  Result Value Ref Range   WBC 9.0 4.6 - 10.2 K/uL   Lymph, poc 2.0 0.6 - 3.4   POC LYMPH PERCENT 22.0 10 - 50 %L   MID (cbc) 0.5 0 - 0.9   POC MID % 6.1 0 - 12 %M   POC Granulocyte 6.5 2 - 6.9   Granulocyte percent 71.9 37 - 80 %G   RBC 4.00 (A) 4.04 - 5.48 M/uL   Hemoglobin 12.2  12.2 - 16.2 g/dL   HCT, POC 16.1 (A) 09.6 - 47.9 %   MCV 85.9 80 - 97 fL   MCH, POC 30.6 27 - 31.2 pg   MCHC 35.6 (A) 31.8 - 35.4 g/dL   RDW, POC 04.5 %   Platelet Count, POC 200 142 - 424 K/uL   MPV 8.6 0 - 99.8 fL     Assessment and Plan: Eevie Lapp is a 51 y.o. female who is here today for sore throat, congestion, cough.  Advised continuance of hydration, cough medication.   Advised to return if no improvement in 3  days.  Flu-like symptoms - Plan: POCT CBC, predniSONE (DELTASONE) 20 MG tablet  Trena PlattStephanie Kieth Hartis, PA-C [facility] 3/18/20186:37 PM

## 2016-11-19 NOTE — Patient Instructions (Addendum)
Continue to hydrate well with the 4 bottles of water. Continue to use the cough medicine as you are doing.   This virus has to run it's course.  Please return if you are having no improvement within the next 4 days.  And certainly if you are needing to use the inhaler more than its schedule dosing.    IF you received an x-ray today, you will receive an invoice from [facility]. Please contact [facility] at [phone] with questions or concerns regarding your invoice.   IF you received labwork today, you will receive an invoice from [facility]. Please contact [facility] at [phone] with questions or concerns regarding your invoice.   Our billing staff will not be able to assist you with questions regarding bills from these companies.  You will be contacted with the lab results as soon as they are available. The fastest way to get your results is to activate your My Chart account. Instructions are located on the last page of this paperwork. If you have not heard from us regarding the results in 2 weeks, please contact this office.

## 2016-11-23 ENCOUNTER — Encounter: Payer: Self-pay | Admitting: Family Medicine

## 2016-12-02 ENCOUNTER — Other Ambulatory Visit: Payer: Self-pay | Admitting: Physician Assistant

## 2016-12-02 NOTE — Telephone Encounter (Signed)
Meds ordered this encounter  Medications  . benzonatate (TESSALON) 100 MG capsule    Sig: TAKE 1-2 CAPSULES (100-200 MG TOTAL) BY MOUTH 3 (THREE) TIMES DAILY AS NEEDED FOR COUGH.    Dispense:  40 capsule    Refill:  0

## 2017-01-02 ENCOUNTER — Other Ambulatory Visit: Payer: Self-pay | Admitting: Family Medicine

## 2017-01-17 ENCOUNTER — Encounter: Payer: Self-pay | Admitting: Family Medicine

## 2017-01-17 ENCOUNTER — Ambulatory Visit (INDEPENDENT_AMBULATORY_CARE_PROVIDER_SITE_OTHER): Payer: 59 | Admitting: Family Medicine

## 2017-01-17 VITALS — BP 110/75 | HR 100 | Temp 98.0°F | Resp 16 | Ht 63.0 in | Wt 274.0 lb

## 2017-01-17 DIAGNOSIS — M797 Fibromyalgia: Secondary | ICD-10-CM | POA: Diagnosis not present

## 2017-01-17 DIAGNOSIS — G894 Chronic pain syndrome: Secondary | ICD-10-CM

## 2017-01-17 DIAGNOSIS — K589 Irritable bowel syndrome without diarrhea: Secondary | ICD-10-CM | POA: Diagnosis not present

## 2017-01-17 DIAGNOSIS — F988 Other specified behavioral and emotional disorders with onset usually occurring in childhood and adolescence: Secondary | ICD-10-CM

## 2017-01-17 DIAGNOSIS — Z5181 Encounter for therapeutic drug level monitoring: Secondary | ICD-10-CM | POA: Diagnosis not present

## 2017-01-17 DIAGNOSIS — Z6841 Body Mass Index (BMI) 40.0 and over, adult: Secondary | ICD-10-CM

## 2017-01-17 DIAGNOSIS — J45909 Unspecified asthma, uncomplicated: Secondary | ICD-10-CM

## 2017-01-17 MED ORDER — TRAMADOL HCL 50 MG PO TABS: 50.0000 mg | ORAL_TABLET | Freq: Four times a day (QID) | ORAL | 2 refills | Status: DC

## 2017-01-17 MED ORDER — AMPHETAMINE-DEXTROAMPHETAMINE 10 MG PO TABS: 10.0000 mg | ORAL_TABLET | Freq: Two times a day (BID) | ORAL | 0 refills | Status: DC

## 2017-01-17 MED ORDER — FLUTICASONE-SALMETEROL 500-50 MCG/DOSE IN AEPB: 1.0000 | INHALATION_SPRAY | Freq: Two times a day (BID) | RESPIRATORY_TRACT | 3 refills | Status: DC

## 2017-01-17 MED ORDER — AMPHETAMINE-DEXTROAMPHETAMINE 10 MG PO TABS
10.0000 mg | ORAL_TABLET | Freq: Two times a day (BID) | ORAL | 0 refills | Status: DC
Start: 2017-01-17 — End: 2017-04-26

## 2017-01-17 MED ORDER — CARISOPRODOL 350 MG PO TABS: 350.0000 mg | ORAL_TABLET | Freq: Four times a day (QID) | ORAL | 2 refills | Status: DC | PRN

## 2017-01-17 NOTE — Progress Notes (Signed)
Subjective:    Patient ID: Gloria Carter, female    DOB: 05/21/1966, 51 y.o.   MRN: [id number] Chief Complaint  Patient presents with  . Follow-up    3 month/bp  . Medication Refill    all    HPI  Gloria Carter is a 51 yo woman here for her routine 3 mo f/u on her chrnoic medical conditions. She had a flair of the pancreatitis several wks ago with abd pain, nausea, diarrhea around her birthday - she thinks of the brown sugar bacon threw her over the edge.  She has been trying to watch her sugar intake.   Seasonal allergies - trigger migraines. On Singulair and Zyrtec with when necessary Mucinex.  Starting to get flairs.  She missed 7d of work 2/2-2/13 from the flu - (did get flu shot and flu test was neg) but those sxs resolved now. Used her albuterol qd to qod to now 1-2x/d. Using advair 1 puff twice a day but sometimes misses evening dose. Has seen allergists in prior - has had skin testing 3x and tried inj prior which didn't do a whole lot but this limits her from going outside as she is allergic to everything.   HTN - not checked bp at home. Urinary urgency Is on hctz 12.5 and drinks a lot of water throughout the day. K runs towards low side (3.3 3 mos prior)  Gerd/IBS:  taking bentyl as needed -1-2x/wk. Has to stick to bland foods  FM: Will occ forget the evening dose of lyrica but will get in the morning and mid-day.  ADD: On adderal 7.4 qd twoards since 2013, increased to bid, then up to 10 bid 9 mos ago. Takes adderall around 6:30 and 1:30 - no anxiety, heart racing,   Obesity: drinks hot tea, no coffee, rare sodas - none since surg in April. Continues to slowly and steadily loose weight!!!  Really encouraged by pt's weight loss - she has lost 7 more lbs since her visit 3 mos prior and her weight has not been this low since 2014 - really encouraged pt to cont to challenge herself by finding little ways to tweak her diet (e.g. 1 mini-donut less one week, cook more at home, etc)   Takes B compelx vit every day with nml vitB12 level 3 mos piror.  Takes a lot of biotin as well. Vitamin D def - checked 3 mos prior and advise to start or double otc vit D supp (20 3 mos prior) which she is doing - she is taking 2 tabs/d but not sure how much - she will email in. Walking more with the cane around the house but for long distances needs rolling walker or cart to lean on Depression/anxetiy  She always complies with her bp meter  Past Medical History:  Diagnosis Date  . ADD (attention deficit disorder)   . Allergy    ENVIRONMENTAL  . Arthritis    knees  . Asthma   . Depression   . Difficulty sleeping   . Dysphagia   . Fibromyalgia   . Fibromyalgia   . GERD (gastroesophageal reflux disease)   . Hernia of abdominal wall   . Hyperlipidemia   . Hypertension   . IBS (irritable bowel syndrome)   . Migraine   . Numbness and tingling in hands   . Obesity   . Swelling of both ankles   . Thyroid disease    Past Surgical History:  Procedure Laterality Date  . INSERTION  OF MESH N/A 03/28/2014   Procedure: INSERTION OF MESH;  Surgeon: Velora Heckler, MD;  Location: [facility];  Service: General;  Laterality: N/A;  . TONSILECTOMY/ADENOIDECTOMY WITH MYRINGOTOMY    . VENTRAL HERNIA REPAIR N/A 03/28/2014   Procedure: LAPAROSCOPIC VENTRAL HERNIA REPAIR WITH MESH;  Surgeon: Velora Heckler, MD;  Location: [facility];  Service: General;  Laterality: N/A;  . WISDOM TOOTH EXTRACTION     Current Outpatient Prescriptions on File Prior to Visit  Medication Sig Dispense Refill  . albuterol (PROVENTIL HFA) 108 (90 BASE) MCG/ACT inhaler INHALE TWO PUFFS BY MOUTH EVERY SIX HOURS AS NEEDED (Patient taking differently: Inhale 2 puffs into the lungs every 6 (six) hours as needed for wheezing or shortness of breath. INHALE TWO PUFFS BY MOUTH EVERY SIX HOURS AS NEEDED) 18 Inhaler 11  . azelastine (OPTIVAR) 0.05 % ophthalmic solution PLACE TWO DRIPS INTO BOTH EYES TWICE DAILY 6 mL 5  . B Complex-C-Folic  Acid (B-COMPLEX BALANCED PO) Take 1 tablet by mouth daily.     . calcium carbonate (OS-CAL) 600 MG TABS Take 600 mg by mouth daily.    . cetirizine (ZYRTEC) 10 MG tablet Take 10 mg by mouth daily as needed for allergies.     . cholecalciferol (VITAMIN D) 400 UNITS TABS Take 400 Units by mouth daily.    . citalopram (CELEXA) 20 MG tablet TAKE 1 TABLET (20 MG TOTAL) BY MOUTH DAILY. 90 tablet 1  . dicyclomine (BENTYL) 20 MG tablet Take 1 tablet (20 mg total) by mouth 3 (three) times daily before meals. As needed for bowel cramping 90 tablet 0  . esomeprazole (NEXIUM) 40 MG capsule TAKE ONE CAPSULE BY MOUTH ONE TIME DAILY FILL 09/07/15 90 capsule 0  . montelukast (SINGULAIR) 10 MG tablet Take 1 tablet (10 mg total) by mouth every morning. 90 tablet 3  . Multiple Vitamin (MULTIVITAMIN) tablet Take 1 tablet by mouth daily.    . ondansetron (ZOFRAN) 4 MG tablet Take 1 tablet (4 mg total) by mouth every 6 (six) hours. 60 tablet 1  . Polyethyl Glycol-Propyl Glycol (SYSTANE) 0.4-0.3 % SOLN Apply 1 drop to eye 3 (three) times daily as needed (dry eyes).    . pregabalin (LYRICA) 150 MG capsule Take 1 capsule (150 mg total) by mouth 3 (three) times daily. 270 capsule 1  . ranitidine (ZANTAC) 150 MG tablet Take 150 mg by mouth 2 (two) times daily as needed for heartburn.    . SUMAtriptan (IMITREX) 100 MG tablet Take 1 tablet (100 mg total) by mouth every 2 (two) hours as needed for migraine. No more than 2 doses in 24 hours. 8 tablet 3  . telmisartan-hydrochlorothiazide (MICARDIS HCT) 80-12.5 MG tablet Take 1 tablet by mouth daily. 90 tablet 3  . vitamin C (ASCORBIC ACID) 500 MG tablet Take 1,000 mg by mouth daily.    . [DISCONTINUED] hyoscyamine (CYSTOSPAZ) 0.15 MG tablet Take 0.15 mg by mouth every 4 (four) hours as needed.     No current facility-administered medications on file prior to visit.    Allergies  Allergen Reactions  . Soap     Any soaps with fragrance cause migraines  . Sulfa Antibiotics  Nausea And Vomiting   Family History  Problem Relation Age of Onset  . COPD Mother   . Fibromyalgia Mother   . Heart disease Father   . Colon polyps Maternal Grandmother   . Diabetes Paternal Grandfather   . Heart disease Paternal Grandfather   . Colon polyps  Maternal Aunt   . Colon polyps      ? father  . Diabetes      MGGF, MGGM  . Diabetes Maternal Uncle   . Aneurysm Maternal Aunt   . Stroke Sister   . Kidney disease Maternal Aunt   . Fibromyalgia Sister   . Fibromyalgia      cousin  . Colon cancer Neg Hx    Social History   Social History  . Marital status: Legally Separated    Spouse name: N/A  . Number of children: 0  . Years of education: N/A   Occupational History  . 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 Smoking status: Never Smoker  . Smokeless tobacco: Never Used  . Alcohol use No  . Drug use: No  . Sexual activity: Yes   Other Topics Concern  . None   Social History Narrative   Married. Education: [employer].    Depression screen [facility] 2/9 01/17/2017 11/19/2016 11/15/2016 10/18/2016 06/24/2016  Decreased Interest 0 0 0 0 0  Down, Depressed, Hopeless 0 0 0 0 0  PHQ - 2 Score 0 0 0 0 0    Review of Systems See hpi    Objective:   Physical Exam  Constitutional: She is oriented to person, place, and time. She appears well-developed and well-nourished. No distress.  HENT:  Head: Normocephalic and atraumatic.  Right Ear: External ear normal.  Left Ear: External ear normal.  Eyes: Conjunctivae are normal. No scleral icterus.  Neck: Normal range of motion. Neck supple. No thyromegaly present.  Cardiovascular: Normal rate, regular rhythm, normal heart sounds and intact distal pulses.   Pulmonary/Chest: Effort normal and breath sounds normal. No respiratory distress.  Musculoskeletal: She exhibits no edema.  Lymphadenopathy:    She has no cervical adenopathy.  Neurological: She is alert and oriented to person, place, and time.  Skin:  Skin is warm and dry. She is not diaphoretic. No erythema.  Psychiatric: She has a normal mood and affect. Her behavior is normal.         BP 110/75   Pulse 100   Temp 98 F (36.7 C) (Oral)   Resp 16   Ht  (1.6 m)   Wt 274 lb (124.3 kg)   SpO2 96%   BMI 48.54 kg/m  Has HR tracked on watch and often in 80s, less often >100 recently.  Assessment & Plan:  Needs flp - Last lipid panel November 2014 showed LDL of 153 with a non-HDL of 186 Check labs at next OV  1. Attention deficit disorder (ADD) without hyperactivity   2. Fibromyalgia   3. Chronic pain syndrome   4. BMI 45.0-49.9, adult (HCC)   5. Encounter for medication monitoring   6. Irritable bowel syndrome, unspecified type   7. Chronic asthma without complication, unspecified asthma severity, unspecified whether persistent      Meds ordered this encounter  Medications  . Fluticasone-Salmeterol (ADVAIR DISKUS) 500-50 MCG/DOSE AEPB    Sig: Inhale 1 puff into the lungs 2 (two) times daily.    Dispense:  60 each    Refill:  3  . carisoprodol (SOMA) 350 MG tablet    Sig: Take 1 tablet (350 mg total) by mouth 4 (four) times daily as needed for muscle spasms.    Dispense:  120 tablet    Refill:  2  . traMADol (ULTRAM) 50 MG tablet    Sig: Take 1 tablet (50 mg total) by  mouth 4 (four) times daily.    Dispense:  120 tablet    Refill:  2  . amphetamine-dextroamphetamine (ADDERALL) 10 MG tablet    Sig: Take 1 tablet (10 mg total) by mouth 2 (two) times daily.    Dispense:  60 tablet    Refill:  0  . amphetamine-dextroamphetamine (ADDERALL) 10 MG tablet    Sig: Take 1 tablet (10 mg total) by mouth 2 (two) times daily with a meal.    Dispense:  60 tablet    Refill:  0    May fill 56 days after date written  . amphetamine-dextroamphetamine (ADDERALL) 10 MG tablet    Sig: Take 1 tablet (10 mg total) by mouth 2 (two) times daily.    Dispense:  60 tablet    Refill:  0    May fill 28 days after date written      Norberto Sorenson, M.D.  Primary Care at [facility] [address] [place], [place] [address] [phone] phone [phone] fax  02/06/17 9:44 PM

## 2017-01-17 NOTE — Patient Instructions (Addendum)
Please email in the amount of vitamin D you're taking. Please also email in a copy of your biometric screening striking let you know whether this is something we need to recheck when you have been fasting for 9 hours at your next visit in 3 months.  Congratulated on the weight loss. Very impressive. Keep up the amazing work.    IF you received an x-ray today, you will receive an invoice from [facility]. Please contact [facility] at [phone] with questions or concerns regarding your invoice.   IF you received labwork today, you will receive an invoice from [facility]. Please contact [facility] at [phone] with questions or concerns regarding your invoice.   Our billing staff will not be able to assist you with questions regarding bills from these companies.  You will be contacted with the lab results as soon as they are available. The fastest way to get your results is to activate your My Chart account. Instructions are located on the last page of this paperwork. If you have not heard from [place] regarding the results in 2 weeks, please contact this office.     We recommend that you schedule a mammogram for breast cancer screening. Typically, you do not need a referral to do this. Please contact a local imaging center to schedule your mammogram.  [facility] - [phone]  *ask for the Radiology Department [facility]) - [phone] or [phone]  [facility] - [phone] [facility] - [phone] [facility] - [phone]  *ask for the Radiology Department [facility] - [phone]  *ask for the Radiology Department [facility] - [phone]  *ask for the Mammography Department [facility] - [phone]

## 2017-01-28 ENCOUNTER — Other Ambulatory Visit: Payer: Self-pay | Admitting: Family Medicine

## 2017-03-01 ENCOUNTER — Other Ambulatory Visit: Payer: Self-pay | Admitting: Physician Assistant

## 2017-04-17 NOTE — Progress Notes (Deleted)
Subjective:    Patient ID: Gloria Carter, female    DOB: 25-Apr-1966, 51 y.o.   MRN: [id number] No chief complaint on file.   HPI  Gloria Carter is a 51 yo woman here for her routine 3 mo f/u on her chrnoic medical conditions and she is fasting for labs today.   Seasonal allergies/asthma - trigger migraines. On Singulair and Zyrtec with when necessary Mucinex. Azelastine eye gtts. Uses her albuterol qd to qod at baseline. Using advair 500/50 1 puff twice a day but sometimes misses evening dose. Has seen allergists in prior - has had skin testing 3x and tried inj prior which didn't do a whole lot. Can't spend much time outside as she is allergic to everything.   HTN - checks bp at home. On  telmisartan-hctz 80-12.5 (does cause some urinary urgency but only because she tries to drink a lot of water).  K runs towards low side (3.3 6 mos prior)  Hypothyroidism: Synthroid 75 mcg qam.  Gerd/IBS:  taking nexium 40 qd, ranitidine 171m bid prn, and bentyl 20 tid prn as (which she usu uses ~1-2x/wk). Has to stick to bland foods. H/o pancreatitis - occ mild flairs with poor food choices self-treated by diet. Keep zofran at home.  FM: On Soma 3571mqid, tramadol 50 qid, and lyrica 150 tid (max FM dose). Will occ forget the evening dose of lyrica but does always take in the morning and mid-day.  Walking more with the cane around the house but for long distances needs rolling walker or cart to lean on  ADD: On adderal since 2013, increased to 10 bid summer 2017. Takes adderall around 6:30 and 1:30 - denies any side effects inc anxiety and palpitations.  Obesity: cut out sodas (almost) since surg in April 2017. Continues to slowly and steadily loose weight!!!  she has lost _ more lbs since her visit 3 mos prior and her weight has not been this low since 2014 - really encouraged pt to cont to challenge herself by finding little ways to tweak her diet (e.g. 1 mini-donut less one week, cook more at home, etc) Lab  Results  Component Value Date   HGBA1C 5.6 10/18/2016   HGBA1C 5.8 05/23/2015   HGBA1C 5.8 05/31/2014    Takes B compelx vit every day with nml vitB12 level 6 mos prior.  Takes a lot of biotin as well. Vit C. Supp w/ calcium carbonate 60053md. Vitamin D def - checked 6 mos prior and advised to double otc vit D supp which she did. Lab Results  Component Value Date   VD25OH 20.8 (L) 10/18/2016   VD25OH 19 (L) 05/23/2015   Depression/anxeity: On citalopram 20.   Migraines: prn imitrex   Past Medical History:  Diagnosis Date  . ADD (attention deficit disorder)   . Allergy    ENVIRONMENTAL  . Arthritis    knees  . Asthma   . Depression   . Difficulty sleeping   . Dysphagia   . Fibromyalgia   . Fibromyalgia   . GERD (gastroesophageal reflux disease)   . Hernia of abdominal wall   . Hyperlipidemia   . Hypertension   . IBS (irritable bowel syndrome)   . Migraine   . Numbness and tingling in hands   . Obesity   . Swelling of both ankles   . Thyroid disease    Past Surgical History:  Procedure Laterality Date  . INSERTION OF MESH N/A 03/28/2014   Procedure: INSERTION OF MESH;  Surgeon: Earnstine Regal, MD;  Location: [facility];  Service: General;  Laterality: N/A;  . TONSILECTOMY/ADENOIDECTOMY WITH MYRINGOTOMY    . VENTRAL HERNIA REPAIR N/A 03/28/2014   Procedure: LAPAROSCOPIC VENTRAL HERNIA REPAIR WITH MESH;  Surgeon: Earnstine Regal, MD;  Location: [facility];  Service: General;  Laterality: N/A;  . WISDOM TOOTH EXTRACTION     Current Outpatient Prescriptions on File Prior to Visit  Medication Sig Dispense Refill  . albuterol (PROVENTIL HFA) 108 (90 BASE) MCG/ACT inhaler INHALE TWO PUFFS BY MOUTH EVERY SIX HOURS AS NEEDED (Patient taking differently: Inhale 2 puffs into the lungs every 6 (six) hours as needed for wheezing or shortness of breath. INHALE TWO PUFFS BY MOUTH EVERY SIX HOURS AS NEEDED) 18 Inhaler 11  . azelastine (OPTIVAR) 0.05 % ophthalmic solution PLACE TWO DRIPS  INTO BOTH EYES TWICE DAILY 6 mL 5  . B Complex-C-Folic Acid (B-COMPLEX BALANCED PO) Take 1 tablet by mouth daily.     . calcium carbonate (OS-CAL) 600 MG TABS Take 600 mg by mouth daily.    . cetirizine (ZYRTEC) 10 MG tablet Take 10 mg by mouth daily as needed for allergies.     . cholecalciferol (VITAMIN D) 400 UNITS TABS Take 400 Units by mouth daily.    . citalopram (CELEXA) 20 MG tablet TAKE 1 TABLET (20 MG TOTAL) BY MOUTH DAILY. 90 tablet 1  . dicyclomine (BENTYL) 20 MG tablet Take 1 tablet (20 mg total) by mouth 3 (three) times daily before meals. As needed for bowel cramping 90 tablet 0  . esomeprazole (NEXIUM) 40 MG capsule TAKE ONE CAPSULE BY MOUTH ONE TIME DAILY FILL 09/07/15 90 capsule 0  . montelukast (SINGULAIR) 10 MG tablet Take 1 tablet (10 mg total) by mouth every morning. 90 tablet 3  . Multiple Vitamin (MULTIVITAMIN) tablet Take 1 tablet by mouth daily.    . ondansetron (ZOFRAN) 4 MG tablet Take 1 tablet (4 mg total) by mouth every 6 (six) hours. 60 tablet 1  . Polyethyl Glycol-Propyl Glycol (SYSTANE) 0.4-0.3 % SOLN Apply 1 drop to eye 3 (three) times daily as needed (dry eyes).    . pregabalin (LYRICA) 150 MG capsule Take 1 capsule (150 mg total) by mouth 3 (three) times daily. 270 capsule 1  . ranitidine (ZANTAC) 150 MG tablet Take 150 mg by mouth 2 (two) times daily as needed for heartburn.    . SUMAtriptan (IMITREX) 100 MG tablet Take 1 tablet (100 mg total) by mouth every 2 (two) hours as needed for migraine. No more than 2 doses in 24 hours. 8 tablet 3  . telmisartan-hydrochlorothiazide (MICARDIS HCT) 80-12.5 MG tablet Take 1 tablet by mouth daily. 90 tablet 3  . vitamin C (ASCORBIC ACID) 500 MG tablet Take 1,000 mg by mouth daily.    . [DISCONTINUED] hyoscyamine (CYSTOSPAZ) 0.15 MG tablet Take 0.15 mg by mouth every 4 (four) hours as needed.     No current facility-administered medications on file prior to visit.    Allergies  Allergen Reactions  . Soap     Any soaps  with fragrance cause migraines  . Sulfa Antibiotics Nausea And Vomiting   Family History  Problem Relation Age of Onset  . COPD Mother   . Fibromyalgia Mother   . Heart disease Father   . Colon polyps Maternal Grandmother   . Diabetes Paternal Grandfather   . Heart disease Paternal Grandfather   . Colon polyps Maternal Aunt   . Colon polyps Unknown        ?  father  . Diabetes Unknown        MGGF, MGGM  . Diabetes Maternal Uncle   . Aneurysm Maternal Aunt   . Stroke Sister   . Kidney disease Maternal Aunt   . Fibromyalgia Sister   . Fibromyalgia Unknown        cousin  . Colon cancer Neg Hx    Social History   Social History  . Marital status: Legally Separated    Spouse name: N/A  . Number of children: 0  . Years of education: N/A   Occupational History  . 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 Smoking status: Never Smoker  . Smokeless tobacco: Never Used  . Alcohol use No  . Drug use: No  . Sexual activity: Yes   Other Topics Concern  . Not on file   Social History Narrative   Married. Education: [employer].    Depression screen [facility] 2/9 01/17/2017 11/19/2016 11/15/2016 10/18/2016 06/24/2016  Decreased Interest 0 0 0 0 0  Down, Depressed, Hopeless 0 0 0 0 0  PHQ - 2 Score 0 0 0 0 0    Review of Systems See hpi    Objective:   Physical Exam  Constitutional: She is oriented to person, place, and time. She appears well-developed and well-nourished. No distress.  HENT:  Head: Normocephalic and atraumatic.  Right Ear: External ear normal.  Left Ear: External ear normal.  Eyes: Conjunctivae are normal. No scleral icterus.  Neck: Normal range of motion. Neck supple. No thyromegaly present.  Cardiovascular: Normal rate, regular rhythm, normal heart sounds and intact distal pulses.   Pulmonary/Chest: Effort normal and breath sounds normal. No respiratory distress.  Musculoskeletal: She exhibits no edema.  Lymphadenopathy:    She has no cervical  adenopathy.  Neurological: She is alert and oriented to person, place, and time.  Skin: Skin is warm and dry. She is not diaphoretic. No erythema.  Psychiatric: She has a normal mood and affect. Her behavior is normal.     There were no vitals taken for this visit.  Assessment & Plan:  Lipids, vit D, cmp, lipase, tsh, cbc. Crp, esr, a1c Pap? Last was 2013 - still seeing Dr. Kris Mouton with gyn?  Delman Cheadle, M.D.  Primary Care at [facility] [address] [place], [place] [address] [phone] phone [phone] fax  04/17/17 1:09 AM

## 2017-04-18 ENCOUNTER — Ambulatory Visit: Payer: 59 | Admitting: Family Medicine

## 2017-04-22 ENCOUNTER — Emergency Department (HOSPITAL_COMMUNITY): Payer: 59

## 2017-04-22 ENCOUNTER — Encounter: Payer: Self-pay | Admitting: Family Medicine

## 2017-04-22 ENCOUNTER — Ambulatory Visit (INDEPENDENT_AMBULATORY_CARE_PROVIDER_SITE_OTHER): Payer: 59

## 2017-04-22 ENCOUNTER — Inpatient Hospital Stay (HOSPITAL_COMMUNITY)
Admission: EM | Admit: 2017-04-22 | Discharge: 2017-04-26 | DRG: 871 | Disposition: A | Discharge status: Home / Self Care | Payer: 59 | Attending: Internal Medicine | Admitting: Internal Medicine

## 2017-04-22 ENCOUNTER — Encounter (HOSPITAL_COMMUNITY): Payer: Self-pay | Admitting: Emergency Medicine

## 2017-04-22 ENCOUNTER — Ambulatory Visit (INDEPENDENT_AMBULATORY_CARE_PROVIDER_SITE_OTHER): Payer: 59 | Admitting: Family Medicine

## 2017-04-22 VITALS — BP 138/82 | HR 116 | Temp 97.6°F | Resp 16 | Ht 62.6 in | Wt 271.0 lb

## 2017-04-22 DIAGNOSIS — R000 Tachycardia, unspecified: Secondary | ICD-10-CM | POA: Diagnosis present

## 2017-04-22 DIAGNOSIS — K861 Other chronic pancreatitis: Secondary | ICD-10-CM | POA: Diagnosis not present

## 2017-04-22 DIAGNOSIS — G8929 Other chronic pain: Secondary | ICD-10-CM | POA: Diagnosis not present

## 2017-04-22 DIAGNOSIS — R109 Unspecified abdominal pain: Secondary | ICD-10-CM | POA: Diagnosis not present

## 2017-04-22 DIAGNOSIS — I959 Hypotension, unspecified: Secondary | ICD-10-CM | POA: Diagnosis present

## 2017-04-22 DIAGNOSIS — D72825 Bandemia: Secondary | ICD-10-CM | POA: Diagnosis not present

## 2017-04-22 DIAGNOSIS — R1084 Generalized abdominal pain: Secondary | ICD-10-CM

## 2017-04-22 DIAGNOSIS — J45909 Unspecified asthma, uncomplicated: Secondary | ICD-10-CM | POA: Diagnosis present

## 2017-04-22 DIAGNOSIS — Z8719 Personal history of other diseases of the digestive system: Secondary | ICD-10-CM

## 2017-04-22 DIAGNOSIS — Z882 Allergy status to sulfonamides status: Secondary | ICD-10-CM | POA: Diagnosis not present

## 2017-04-22 DIAGNOSIS — Z833 Family history of diabetes mellitus: Secondary | ICD-10-CM

## 2017-04-22 DIAGNOSIS — K219 Gastro-esophageal reflux disease without esophagitis: Secondary | ICD-10-CM | POA: Diagnosis present

## 2017-04-22 DIAGNOSIS — K589 Irritable bowel syndrome without diarrhea: Secondary | ICD-10-CM | POA: Diagnosis present

## 2017-04-22 DIAGNOSIS — E878 Other disorders of electrolyte and fluid balance, not elsewhere classified: Secondary | ICD-10-CM | POA: Diagnosis present

## 2017-04-22 DIAGNOSIS — M5136 Other intervertebral disc degeneration, lumbar region: Secondary | ICD-10-CM | POA: Diagnosis present

## 2017-04-22 DIAGNOSIS — Z825 Family history of asthma and other chronic lower respiratory diseases: Secondary | ICD-10-CM | POA: Diagnosis not present

## 2017-04-22 DIAGNOSIS — K8500 Idiopathic acute pancreatitis without necrosis or infection: Secondary | ICD-10-CM | POA: Diagnosis not present

## 2017-04-22 DIAGNOSIS — K529 Noninfective gastroenteritis and colitis, unspecified: Secondary | ICD-10-CM | POA: Diagnosis present

## 2017-04-22 DIAGNOSIS — Z8371 Family history of colonic polyps: Secondary | ICD-10-CM | POA: Diagnosis not present

## 2017-04-22 DIAGNOSIS — E876 Hypokalemia: Secondary | ICD-10-CM | POA: Diagnosis not present

## 2017-04-22 DIAGNOSIS — K859 Acute pancreatitis without necrosis or infection, unspecified: Secondary | ICD-10-CM | POA: Diagnosis present

## 2017-04-22 DIAGNOSIS — T502X5A Adverse effect of carbonic-anhydrase inhibitors, benzothiadiazides and other diuretics, initial encounter: Secondary | ICD-10-CM | POA: Diagnosis present

## 2017-04-22 DIAGNOSIS — N179 Acute kidney failure, unspecified: Secondary | ICD-10-CM | POA: Diagnosis not present

## 2017-04-22 DIAGNOSIS — F329 Major depressive disorder, single episode, unspecified: Secondary | ICD-10-CM | POA: Diagnosis present

## 2017-04-22 DIAGNOSIS — Z8249 Family history of ischemic heart disease and other diseases of the circulatory system: Secondary | ICD-10-CM

## 2017-04-22 DIAGNOSIS — Z9889 Other specified postprocedural states: Secondary | ICD-10-CM

## 2017-04-22 DIAGNOSIS — E860 Dehydration: Secondary | ICD-10-CM | POA: Diagnosis present

## 2017-04-22 DIAGNOSIS — Z7951 Long term (current) use of inhaled steroids: Secondary | ICD-10-CM

## 2017-04-22 DIAGNOSIS — A419 Sepsis, unspecified organism: Secondary | ICD-10-CM | POA: Diagnosis present

## 2017-04-22 DIAGNOSIS — A4189 Other specified sepsis: Secondary | ICD-10-CM | POA: Diagnosis not present

## 2017-04-22 DIAGNOSIS — E871 Hypo-osmolality and hyponatremia: Secondary | ICD-10-CM

## 2017-04-22 DIAGNOSIS — Z823 Family history of stroke: Secondary | ICD-10-CM

## 2017-04-22 DIAGNOSIS — M797 Fibromyalgia: Secondary | ICD-10-CM | POA: Diagnosis present

## 2017-04-22 DIAGNOSIS — Z6841 Body Mass Index (BMI) 40.0 and over, adult: Secondary | ICD-10-CM

## 2017-04-22 DIAGNOSIS — R6520 Severe sepsis without septic shock: Secondary | ICD-10-CM | POA: Diagnosis present

## 2017-04-22 DIAGNOSIS — E039 Hypothyroidism, unspecified: Secondary | ICD-10-CM | POA: Diagnosis present

## 2017-04-22 DIAGNOSIS — Z79899 Other long term (current) drug therapy: Secondary | ICD-10-CM

## 2017-04-22 DIAGNOSIS — I498 Other specified cardiac arrhythmias: Secondary | ICD-10-CM | POA: Diagnosis present

## 2017-04-22 DIAGNOSIS — M5137 Other intervertebral disc degeneration, lumbosacral region: Secondary | ICD-10-CM | POA: Diagnosis present

## 2017-04-22 DIAGNOSIS — R7881 Bacteremia: Secondary | ICD-10-CM | POA: Diagnosis not present

## 2017-04-22 DIAGNOSIS — I10 Essential (primary) hypertension: Secondary | ICD-10-CM | POA: Diagnosis present

## 2017-04-22 LAB — COMPREHENSIVE METABOLIC PANEL
A/G RATIO: 1.3 (ref 1.2–2.2)
ALBUMIN: 3.9 g/dL (ref 3.5–5.5)
ALK PHOS: 115 U/L (ref 38–126)
ALT: 21 IU/L (ref 0–32)
ALT: 22 U/L (ref 14–54)
ANION GAP: 14 (ref 5–15)
AST: 27 IU/L (ref 0–40)
AST: 28 U/L (ref 15–41)
Albumin: 3.4 g/dL — ABNORMAL LOW (ref 3.5–5.0)
Alkaline Phosphatase: 146 IU/L — ABNORMAL HIGH (ref 39–117)
BILIRUBIN TOTAL: 1.6 mg/dL — AB (ref 0.3–1.2)
BUN / CREAT RATIO: 12 (ref 9–23)
BUN: 14 mg/dL (ref 6–24)
BUN: 19 mg/dL (ref 6–20)
Bilirubin Total: 1.6 mg/dL — ABNORMAL HIGH (ref 0.0–1.2)
CALCIUM: 9.6 mg/dL (ref 8.7–10.2)
CALCIUM: 8.5 mg/dL — AB (ref 8.9–10.3)
CO2: 31 mmol/L — ABNORMAL HIGH (ref 20–29)
CO2: 31 mmol/L (ref 22–32)
CREATININE: 1.43 mg/dL — AB (ref 0.44–1.00)
Chloride: 79 mmol/L — ABNORMAL LOW (ref 96–106)
Chloride: 78 mmol/L — ABNORMAL LOW (ref 101–111)
Creatinine, Ser: 1.15 mg/dL — ABNORMAL HIGH (ref 0.57–1.00)
GFR calc non Af Amer: 41 mL/min — ABNORMAL LOW (ref >60)
GFR, EST AFRICAN AMERICAN: 63 mL/min/{1.73_m2} (ref >59)
GFR, EST AFRICAN AMERICAN: 47 mL/min — AB (ref >60)
GFR, EST NON AFRICAN AMERICAN: 54 mL/min/{1.73_m2} — AB (ref >59)
GLOBULIN, TOTAL: 2.9 g/dL (ref 1.5–4.5)
GLUCOSE: 133 mg/dL — AB (ref 65–99)
Glucose: 146 mg/dL — ABNORMAL HIGH (ref 65–99)
POTASSIUM: 2.8 mmol/L — AB (ref 3.5–5.2)
Potassium: 2.5 mmol/L — CL (ref 3.5–5.1)
SODIUM: 121 mmol/L — AB (ref 134–144)
Sodium: 123 mmol/L — ABNORMAL LOW (ref 135–145)
TOTAL PROTEIN: 6.8 g/dL (ref 6.0–8.5)
TOTAL PROTEIN: 7.6 g/dL (ref 6.5–8.1)

## 2017-04-22 LAB — POCT URINALYSIS DIP (MANUAL ENTRY)
GLUCOSE UA: NEGATIVE mg/dL
Nitrite, UA: NEGATIVE
PH UA: 5.5 (ref 5.0–8.0)
Protein Ur, POC: >=300 mg/dL — AB
Spec Grav, UA: >=1.03 — AB (ref 1.010–1.025)
Urobilinogen, UA: 1 E.U./dL

## 2017-04-22 LAB — CBC WITH DIFFERENTIAL/PLATELET
BASOS PCT: 0 %
Basophils Absolute: 0 10*3/uL (ref 0.0–0.1)
EOS PCT: 0 %
Eosinophils Absolute: 0 10*3/uL (ref 0.0–0.7)
HEMATOCRIT: 39.9 % (ref 36.0–46.0)
Hemoglobin: 14.5 g/dL (ref 12.0–15.0)
LYMPHS PCT: 5 %
Lymphs Abs: 2 10*3/uL (ref 0.7–4.0)
MCH: 30.3 pg (ref 26.0–34.0)
MCHC: 36.3 g/dL — AB (ref 30.0–36.0)
MCV: 83.5 fL (ref 78.0–100.0)
MONOS PCT: 10 %
Monocytes Absolute: 4 10*3/uL — ABNORMAL HIGH (ref 0.1–1.0)
Neutro Abs: 34.1 10*3/uL — ABNORMAL HIGH (ref 1.7–7.7)
Neutrophils Relative %: 85 %
Platelets: 220 10*3/uL (ref 150–400)
RBC: 4.78 MIL/uL (ref 3.87–5.11)
RDW: 12.9 % (ref 11.5–15.5)
WBC: 40.1 10*3/uL — AB (ref 4.0–10.5)

## 2017-04-22 LAB — POCT CBC
Granulocyte percent: 85.7 %G — AB (ref 37–80)
HEMATOCRIT: 44.6 % (ref 37.7–47.9)
Hemoglobin: 15.2 g/dL (ref 12.2–16.2)
LYMPH, POC: 3 (ref 0.6–3.4)
MCH, POC: 29.6 pg (ref 27–31.2)
MCHC: 34.1 g/dL (ref 31.8–35.4)
MCV: 86.7 fL (ref 80–97)
MID (cbc): 3 — AB (ref 0–0.9)
MPV: 9.5 fL (ref 0–99.8)
POC GRANULOCYTE: 35.9 — AB (ref 2–6.9)
POC LYMPH PERCENT: 7.2 %L — AB (ref 10–50)
POC MID %: 7.1 % (ref 0–12)
Platelet Count, POC: 249 10*3/uL (ref 142–424)
RBC: 5.14 M/uL (ref 4.04–5.48)
RDW, POC: 12.8 %
WBC: 41.9 10*3/uL — AB (ref 4.6–10.2)

## 2017-04-22 LAB — AMYLASE: Amylase: 140 U/L — ABNORMAL HIGH (ref 31–124)

## 2017-04-22 LAB — LIPASE, BLOOD: LIPASE: 57 U/L — AB (ref 11–51)

## 2017-04-22 LAB — LIPASE: Lipase: 86 U/L — ABNORMAL HIGH (ref 14–72)

## 2017-04-22 LAB — I-STAT CG4 LACTIC ACID, ED

## 2017-04-22 LAB — LACTATE DEHYDROGENASE: LDH: 278 U/L — ABNORMAL HIGH (ref 98–192)

## 2017-04-22 MED ORDER — IOPAMIDOL (ISOVUE-300) INJECTION 61%
INTRAVENOUS | Status: AC
  Filled 2017-04-22: qty 100

## 2017-04-22 MED ORDER — POLYVINYL ALCOHOL 1.4 % OP SOLN: 1.0000 [drp] | Freq: Three times a day (TID) | OPHTHALMIC | Status: DC | PRN

## 2017-04-22 MED ORDER — DICYCLOMINE HCL 20 MG PO TABS
20.0000 mg | ORAL_TABLET | Freq: Three times a day (TID) | ORAL | Status: DC
  Administered 2017-04-23 – 2017-04-26 (×12): 20 mg via ORAL
  Filled 2017-04-22 (×14): qty 1

## 2017-04-22 MED ORDER — DOCUSATE SODIUM 100 MG PO CAPS
100.0000 mg | ORAL_CAPSULE | Freq: Two times a day (BID) | ORAL | Status: DC
  Administered 2017-04-26: 100 mg via ORAL
  Filled 2017-04-22 (×5): qty 1

## 2017-04-22 MED ORDER — MONTELUKAST SODIUM 10 MG PO TABS
10.0000 mg | ORAL_TABLET | Freq: Every morning | ORAL | Status: DC
  Administered 2017-04-23 – 2017-04-26 (×4): 10 mg via ORAL
  Filled 2017-04-22 (×4): qty 1

## 2017-04-22 MED ORDER — CARISOPRODOL 350 MG PO TABS
350.0000 mg | ORAL_TABLET | Freq: Four times a day (QID) | ORAL | Status: DC
  Administered 2017-04-22 – 2017-04-26 (×15): 350 mg via ORAL
  Filled 2017-04-22 (×15): qty 1

## 2017-04-22 MED ORDER — PIPERACILLIN-TAZOBACTAM 3.375 G IVPB
3.3750 g | Freq: Three times a day (TID) | INTRAVENOUS | Status: DC
  Administered 2017-04-23 – 2017-04-26 (×10): 3.375 g via INTRAVENOUS
  Filled 2017-04-22 (×10): qty 50

## 2017-04-22 MED ORDER — ENOXAPARIN SODIUM 40 MG/0.4ML ~~LOC~~ SOLN: 40.0000 mg | SUBCUTANEOUS | Status: DC

## 2017-04-22 MED ORDER — ONDANSETRON HCL 4 MG/2ML IJ SOLN
4.0000 mg | Freq: Once | INTRAMUSCULAR | Status: AC
  Administered 2017-04-22: 4 mg via INTRAVENOUS
  Filled 2017-04-22: qty 2

## 2017-04-22 MED ORDER — PREGABALIN 75 MG PO CAPS
150.0000 mg | ORAL_CAPSULE | Freq: Two times a day (BID) | ORAL | Status: DC
  Administered 2017-04-22 – 2017-04-26 (×8): 150 mg via ORAL
  Filled 2017-04-22 (×8): qty 2

## 2017-04-22 MED ORDER — METOCLOPRAMIDE HCL 5 MG/ML IJ SOLN
10.0000 mg | Freq: Once | INTRAMUSCULAR | Status: AC
  Administered 2017-04-22: 10 mg via INTRAVENOUS
  Filled 2017-04-22: qty 2

## 2017-04-22 MED ORDER — SODIUM CHLORIDE 0.9 % IV BOLUS (SEPSIS)
1000.0000 mL | Freq: Once | INTRAVENOUS | Status: AC
  Administered 2017-04-22: 1000 mL via INTRAVENOUS

## 2017-04-22 MED ORDER — CITALOPRAM HYDROBROMIDE 20 MG PO TABS
20.0000 mg | ORAL_TABLET | Freq: Every day | ORAL | Status: DC
  Administered 2017-04-23 – 2017-04-26 (×4): 20 mg via ORAL
  Filled 2017-04-22 (×4): qty 1

## 2017-04-22 MED ORDER — ENOXAPARIN SODIUM 60 MG/0.6ML ~~LOC~~ SOLN
60.0000 mg | Freq: Every day | SUBCUTANEOUS | Status: DC
  Administered 2017-04-22 – 2017-04-25 (×4): 60 mg via SUBCUTANEOUS
  Filled 2017-04-22 (×4): qty 0.6

## 2017-04-22 MED ORDER — MORPHINE SULFATE (PF) 2 MG/ML IV SOLN
2.0000 mg | INTRAVENOUS | Status: DC | PRN
  Administered 2017-04-22 – 2017-04-24 (×7): 2 mg via INTRAVENOUS
  Filled 2017-04-22 (×7): qty 1

## 2017-04-22 MED ORDER — PANTOPRAZOLE SODIUM 40 MG PO TBEC
40.0000 mg | DELAYED_RELEASE_TABLET | Freq: Every day | ORAL | Status: DC
  Administered 2017-04-23 – 2017-04-26 (×4): 40 mg via ORAL
  Filled 2017-04-22 (×4): qty 1

## 2017-04-22 MED ORDER — KETOTIFEN FUMARATE 0.025 % OP SOLN
2.0000 [drp] | Freq: Two times a day (BID) | OPHTHALMIC | Status: DC
  Administered 2017-04-23: 2 [drp] via OPHTHALMIC
  Filled 2017-04-22: qty 5

## 2017-04-22 MED ORDER — LEVOTHYROXINE SODIUM 75 MCG PO TABS
75.0000 ug | ORAL_TABLET | Freq: Every day | ORAL | Status: DC
  Administered 2017-04-23 – 2017-04-26 (×4): 75 ug via ORAL
  Filled 2017-04-22 (×4): qty 1

## 2017-04-22 MED ORDER — MOMETASONE FURO-FORMOTEROL FUM 200-5 MCG/ACT IN AERO
2.0000 | INHALATION_SPRAY | Freq: Two times a day (BID) | RESPIRATORY_TRACT | Status: DC
  Administered 2017-04-23 – 2017-04-26 (×8): 2 via RESPIRATORY_TRACT
  Filled 2017-04-22: qty 8.8

## 2017-04-22 MED ORDER — POTASSIUM CHLORIDE 10 MEQ/100ML IV SOLN
10.0000 meq | Freq: Once | INTRAVENOUS | Status: AC
  Administered 2017-04-22: 10 meq via INTRAVENOUS
  Filled 2017-04-22: qty 100

## 2017-04-22 MED ORDER — IOPAMIDOL (ISOVUE-300) INJECTION 61%
100.0000 mL | Freq: Once | INTRAVENOUS | Status: AC | PRN
  Administered 2017-04-22: 100 mL via INTRAVENOUS

## 2017-04-22 MED ORDER — POLYETHYLENE GLYCOL 3350 17 G PO PACK: 17.0000 g | PACK | Freq: Every day | ORAL | Status: DC | PRN

## 2017-04-22 MED ORDER — ONDANSETRON HCL 4 MG PO TABS: 4.0000 mg | ORAL_TABLET | Freq: Four times a day (QID) | ORAL | Status: DC | PRN

## 2017-04-22 MED ORDER — SODIUM CHLORIDE 0.9% FLUSH
3.0000 mL | Freq: Two times a day (BID) | INTRAVENOUS | Status: DC
  Administered 2017-04-22 – 2017-04-25 (×6): 3 mL via INTRAVENOUS

## 2017-04-22 MED ORDER — POTASSIUM CHLORIDE IN NACL 40-0.9 MEQ/L-% IV SOLN
INTRAVENOUS | Status: DC
  Administered 2017-04-22 – 2017-04-25 (×4): 125 mL/h via INTRAVENOUS
  Filled 2017-04-22 (×5): qty 1000

## 2017-04-22 MED ORDER — ONDANSETRON HCL 4 MG/2ML IJ SOLN: 4.0000 mg | Freq: Four times a day (QID) | INTRAMUSCULAR | Status: DC | PRN

## 2017-04-22 MED ORDER — ALBUTEROL SULFATE (2.5 MG/3ML) 0.083% IN NEBU: 3.0000 mL | INHALATION_SOLUTION | Freq: Four times a day (QID) | RESPIRATORY_TRACT | Status: DC | PRN

## 2017-04-22 MED ORDER — TRAMADOL HCL 50 MG PO TABS
50.0000 mg | ORAL_TABLET | Freq: Four times a day (QID) | ORAL | Status: DC | PRN
  Administered 2017-04-22 – 2017-04-26 (×13): 50 mg via ORAL
  Filled 2017-04-22 (×14): qty 1

## 2017-04-22 MED ORDER — PIPERACILLIN-TAZOBACTAM 3.375 G IVPB 30 MIN
3.3750 g | Freq: Once | INTRAVENOUS | Status: AC
  Administered 2017-04-22: 3.375 g via INTRAVENOUS
  Filled 2017-04-22: qty 50

## 2017-04-22 MED ORDER — FENTANYL CITRATE (PF) 100 MCG/2ML IJ SOLN
50.0000 ug | Freq: Once | INTRAMUSCULAR | Status: AC
  Administered 2017-04-22: 50 ug via INTRAVENOUS
  Filled 2017-04-22: qty 2

## 2017-04-22 MED ORDER — MORPHINE SULFATE (PF) 4 MG/ML IV SOLN
4.0000 mg | Freq: Once | INTRAVENOUS | Status: AC
  Administered 2017-04-22: 4 mg via INTRAVENOUS
  Filled 2017-04-22: qty 1

## 2017-04-22 NOTE — Progress Notes (Signed)
Rx Brief note:  Lovenox Wt=124 kg, CrCl~57 ml/min, BMI=50  Rx adjusted Lovenox to 60 mg (~0.5 mg/kg) daily in pt with BMI>30  Thanks Lorenza EvangelistGreen, Ijeoma Loor R 04/22/2017 11:07 PM

## 2017-04-22 NOTE — ED Triage Notes (Signed)
Patient states she went to [place] today for upper abdominal pain. Pt was told to go directly here due to WBC 41.9 and for further evaluation.

## 2017-04-22 NOTE — Patient Instructions (Addendum)
Go directly to [facility] Department following visit for further evaluation, hydration, pain control.   IF you received an x-ray today, you will receive an invoice from [facility]. Please contact [facility] at [phone] with questions or concerns regarding your invoice.   IF you received labwork today, you will receive an invoice from [facility]. Please contact [facility] at [phone] with questions or concerns regarding your invoice.   Our billing staff will not be able to assist you with questions regarding bills from these companies.  You will be contacted with the lab results as soon as they are available. The fastest way to get your results is to activate your My Chart account. Instructions are located on the last page of this paperwork. If you have not heard from us regarding the results in 2 weeks, please contact this office.

## 2017-04-22 NOTE — H&P (Signed)
[facility]  History and Physical Tyrika Newman L. Link Snuffer, MD Pager [phone] (if 7P to 7A, page night hospitalist on amion.Evalyn Shultis Rapp-Austin AVW:[id number] DOB: 1966-04-02 DOA: 04/22/2017  Referring physician: ED  PCP: Sherren Mocha, MD   Chief Complaint: abd pain   HPI:   Patient w/known hx of pancreatitis presents w/ severe abdominal pain over past 3 days. No PO intake in 3 days but is[place] per mother . Her pain is visible today. She is writhing 2/2 abd pain. CT showed no signs of abcess or necrosis. Other than severe abd pain radiating to back, minimal complaints. No fevers but has tachycardia and hypotension.   In ED, patient shown to have pancreatitis on CT . 3L NS bolus given. KCl run x 1. Found to have moderate hypokalemia of 2.5 and hyponatremia in 120s. Started on zosyn   Chart Review:  ED notes, images and labs   Review of Systems:  Has abd pain   Past Medical History:  Diagnosis Date  . ADD (attention deficit disorder)   . Allergy    ENVIRONMENTAL  . Arthritis    knees  . Asthma   . Depression   . Difficulty sleeping   . Dysphagia   . Fibromyalgia   . Fibromyalgia   . GERD (gastroesophageal reflux disease)   . Hernia of abdominal wall   . Hyperlipidemia   . Hypertension   . IBS (irritable bowel syndrome)   . Migraine   . Numbness and tingling in hands   . Obesity   . Swelling of both ankles   . Thyroid disease     Past Surgical History:  Procedure Laterality Date  . INSERTION OF MESH N/A 03/28/2014   Procedure: INSERTION OF MESH;  Surgeon: Velora Heckler, MD;  Location: [facility];  Service: General;  Laterality: N/A;  . TONSILECTOMY/ADENOIDECTOMY WITH MYRINGOTOMY    . VENTRAL HERNIA REPAIR N/A 03/28/2014   Procedure: LAPAROSCOPIC VENTRAL HERNIA REPAIR WITH MESH;  Surgeon: Velora Heckler, MD;  Location: [facility];  Service: General;  Laterality: N/A;  . WISDOM TOOTH EXTRACTION      Social History:  reports that she has never smoked. She has  never used smokeless tobacco. She reports that she does not drink alcohol or use drugs.  Allergies  Allergen Reactions  . Soap     Any soaps with fragrance cause migraines  . Sulfa Antibiotics Nausea And Vomiting    Family History  Problem Relation Age of Onset  . COPD Mother   . Fibromyalgia Mother   . Heart disease Father   . Colon polyps Maternal Grandmother   . Diabetes Paternal Grandfather   . Heart disease Paternal Grandfather   . Colon polyps Maternal Aunt   . Colon polyps Unknown        ? father  . Diabetes Unknown        MGGF, MGGM  . Diabetes Maternal Uncle   . Aneurysm Maternal Aunt   . Stroke Sister   . Kidney disease Maternal Aunt   . Fibromyalgia Sister   . Fibromyalgia Unknown        cousin  . Colon cancer Neg Hx      Prior to Admission medications   Medication Sig Start Date End Date Taking? Authorizing Provider  albuterol (PROVENTIL HFA) 108 (90 BASE) MCG/ACT inhaler INHALE TWO PUFFS BY MOUTH EVERY SIX HOURS AS NEEDED Patient taking differently: Inhale 2 puffs into the lungs every 6 (six) hours as needed for wheezing or  shortness of breath. INHALE TWO PUFFS BY MOUTH EVERY SIX HOURS AS NEEDED 08/21/15  Yes Sherren Mocha, MD  amphetamine-dextroamphetamine (ADDERALL) 10 MG tablet Take 1 tablet (10 mg total) by mouth 2 (two) times daily. 01/17/17  Yes Sherren Mocha, MD  azelastine (OPTIVAR) 0.05 % ophthalmic solution PLACE TWO DRIPS INTO BOTH EYES TWICE DAILY Patient taking differently: PLACE TWO DRIPS INTO BOTH EYES TWICE DAILY FOR DRY EYES 09/04/16  Yes Sherren Mocha, MD  B Complex-C-Folic Acid (B-COMPLEX BALANCED PO) Take 1 tablet by mouth daily.    Yes [provider]  calcium carbonate (OS-CAL) 600 MG TABS Take 600 mg by mouth daily.   Yes [provider]  carisoprodol (SOMA) 350 MG tablet Take 1 tablet (350 mg total) by mouth 4 (four) times daily as needed for muscle spasms. 01/17/17  Yes Sherren Mocha, MD  cetirizine (ZYRTEC) 10 MG tablet Take 10  mg by mouth daily as needed for allergies.    Yes [provider]  cholecalciferol (VITAMIN D) 400 UNITS TABS Take 400 Units by mouth daily.   Yes [provider]  citalopram (CELEXA) 20 MG tablet TAKE 1 TABLET (20 MG TOTAL) BY MOUTH DAILY. 01/03/17  Yes Jeffery, Chelle, PA-C  dicyclomine (BENTYL) 20 MG tablet Take 1 tablet (20 mg total) by mouth 3 (three) times daily before meals. As needed for bowel cramping 06/24/16  Yes Sherren Mocha, MD  esomeprazole (NEXIUM) 40 MG capsule TAKE ONE CAPSULE BY MOUTH ONE TIME DAILY Patient taking differently: TAKE ONE CAPSULE BY MOUTH ONE TIME DAILY FOR INDIGESTION 03/01/17  Yes Sherren Mocha, MD  Fluticasone-Salmeterol (ADVAIR DISKUS) 500-50 MCG/DOSE AEPB Inhale 1 puff into the lungs 2 (two) times daily. 01/17/17  Yes Sherren Mocha, MD  montelukast (SINGULAIR) 10 MG tablet Take 1 tablet (10 mg total) by mouth every morning. 06/24/16  Yes Sherren Mocha, MD  Multiple Vitamin (MULTIVITAMIN) tablet Take 1 tablet by mouth daily.   Yes [provider]  ondansetron (ZOFRAN) 4 MG tablet Take 1 tablet (4 mg total) by mouth every 6 (six) hours. Patient taking differently: Take 4 mg by mouth every 8 (eight) hours as needed for nausea or vomiting.  06/24/16  Yes Sherren Mocha, MD  pregabalin (LYRICA) 150 MG capsule Take 1 capsule (150 mg total) by mouth 3 (three) times daily. Patient taking differently: Take 150 mg by mouth 2 (two) times daily.  10/18/16  Yes Sherren Mocha, MD  SUMAtriptan (IMITREX) 100 MG tablet Take 1 tablet (100 mg total) by mouth every 2 (two) hours as needed for migraine. No more than 2 doses in 24 hours. 06/24/16  Yes Sherren Mocha, MD  SYNTHROID 75 MCG tablet TAKE 1 TABLET (75 MCG TOTAL) BY MOUTH DAILY BEFORE BREAKFAST. 01/28/17  Yes Sherren Mocha, MD  telmisartan-hydrochlorothiazide (MICARDIS HCT) 80-12.5 MG tablet Take 1 tablet by mouth daily. 06/24/16  Yes Sherren Mocha, MD  traMADol (ULTRAM) 50 MG tablet Take 1 tablet (50 mg total) by mouth 4 (four)  times daily. 01/17/17  Yes Sherren Mocha, MD  vitamin C (ASCORBIC ACID) 500 MG tablet Take 1,000 mg by mouth daily.   Yes [provider]  amphetamine-dextroamphetamine (ADDERALL) 10 MG tablet Take 1 tablet (10 mg total) by mouth 2 (two) times daily with a meal. Patient not taking: Reported on 04/22/2017 01/17/17   Sherren Mocha, MD  amphetamine-dextroamphetamine (ADDERALL) 10 MG tablet Take 1 tablet (10 mg total) by mouth  2 (two) times daily. Patient not taking: Reported on 04/22/2017 01/17/17   Sherren Mocha, MD  Polyethyl Glycol-Propyl Glycol (SYSTANE) 0.4-0.3 % SOLN Apply 1 drop to eye 3 (three) times daily as needed (dry eyes).    [provider]  ranitidine (ZANTAC) 150 MG tablet Take 150 mg by mouth 2 (two) times daily as needed for heartburn.    [provider]   Physical Exam: Vitals:   04/22/17 1806 04/22/17 1844 04/22/17 1914 04/22/17 2019  BP: (!) 145/84 (!) 141/81 (!) 159/89 103/74  Pulse: 89 99 88 (!) 105  Resp: 16 16 14 16   Temp:    99.1 F (37.3 C)  TempSrc:    Oral  SpO2: 91% 94% 98% 95%     General:  WF in visible distress  HEENT: dry membranes, no icterus  Cardiovascular: sinus tachy , no mrg   Respiratory: CTAB, no rales   Abdomen: diffuse tenderness to palpation   Skin: dry, warm   Musculoskeletal: no focal deficits   Psychiatric: is anxious , in pain   Neurologic: no focal deficits  Wt Readings from Last 3 Encounters:  04/22/17 122.9 kg (271 lb)  01/17/17 124.3 kg (274 lb)  11/19/16 126.1 kg (278 lb)    Labs on Admission:  Basic Metabolic Panel:  Recent Labs Lab 04/22/17 1505 04/22/17 1913  NA 121* 123*  K 2.8* 2.5*  CL 79* 78*  CO2 31* 31  GLUCOSE 146* 133*  BUN 14 19  CREATININE 1.15* 1.43*  CALCIUM 9.6 8.5*   Liver Function Tests:  Recent Labs Lab 04/22/17 1505 04/22/17 1913  AST 27 28  ALT 21 22  ALKPHOS 146* 115  BILITOT 1.6* 1.6*  PROT 6.8 7.6  ALBUMIN 3.9 3.4*    Recent Labs Lab 04/22/17 1505  04/22/17 1913  LIPASE 86* 57*  AMYLASE 140*  --    No results for input(s): AMMONIA in the last 168 hours. CBC:  Recent Labs Lab 04/22/17 1521 04/22/17 1913  WBC 41.9* 40.1*  NEUTROABS  --  34.1*  HGB 15.2 14.5  HCT 44.6 39.9  MCV 86.7 83.5  PLT  --  220   Cardiac Enzymes: No results for input(s): CKTOTAL, CKMB, CKMBINDEX, TROPONINI in the last 168 hours.  BNP (last 3 results) No results for input(s): BNP in the last 8760 hours.  ProBNP (last 3 results) No results for input(s): PROBNP in the last 8760 hours.  CBG: No results for input(s): GLUCAP in the last 168 hours.  Radiological Exams on Admission: Ct Abdomen Pelvis W Contrast  Result Date: 04/22/2017 CLINICAL DATA:  Epigastric abdominal pain.  Leukocytosis. EXAM: CT ABDOMEN AND PELVIS WITH CONTRAST TECHNIQUE: Multidetector CT imaging of the abdomen and pelvis was performed using the standard protocol following bolus administration of intravenous contrast. CONTRAST:  ISOVUE-300 IOPAMIDOL (ISOVUE-300) INJECTION 61% COMPARISON:  Abdominal radiographs from earlier today. FINDINGS: Lower chest: No significant pulmonary nodules or acute consolidative airspace disease. Hepatobiliary: Diffuse hepatic steatosis. No definite liver surface irregularity. No liver mass. Normal gallbladder with no radiopaque cholelithiasis. No biliary ductal dilatation. Pancreas: Diffuse pancreatic parenchymal thickening with prominent diffuse peripancreatic fat stranding and ill-defined fluid, compatible with acute pancreatitis. No definite regions of pancreatic parenchymal nonenhancement or pancreatic parenchymal gas to suggest necrotizing pancreatitis. No measurable peripancreatic fluid collections. No pancreatic mass or duct dilation. Spleen: Normal size spleen. Punctate scattered granulomatous splenic calcifications. No splenic mass. Adrenals/Urinary Tract: Normal adrenals. Hydronephrosis. Simple 1.9 cm lateral interpolar right renal cyst. Normal  bladder.  Stomach/Bowel: Grossly normal stomach. Normal caliber small bowel with no small bowel wall thickening. Normal appendix. Minimal sigmoid diverticulosis, with no large bowel wall thickening or pericolonic fat stranding. Vascular/Lymphatic: Atherosclerotic nonaneurysmal abdominal aorta. Patent portal, splenic, hepatic and renal veins. No pathologically enlarged lymph nodes in the abdomen or pelvis. Reproductive: Grossly normal uterus.  No adnexal mass. Other: No pneumoperitoneum, ascites or focal fluid collection. Small fat containing supraumbilical midline ventral abdominal hernia. Musculoskeletal: No aggressive appearing focal osseous lesions. Mild thoracolumbar spondylosis. IMPRESSION: 1. Prominent acute pancreatitis. No evidence of necrotizing pancreatitis or measurable peripancreatic fluid collections. 2. No radiopaque cholelithiasis.  No biliary ductal dilatation. 3. Diffuse hepatic steatosis. 4.  Aortic Atherosclerosis (ICD10-I70.0). 5. Small fat containing supraumbilical midline ventral abdominal hernia . Electronically Signed   By: Delbert PhenixJason A Poff M.D.   On: 04/22/2017 20:10   Dg Abd Acute W/chest  Result Date: 04/22/2017 CLINICAL DATA:  Abdominal pain and nausea; fever EXAM: DG ABDOMEN ACUTE W/ 1V CHEST COMPARISON:  CT abdomen and pelvis February 03, 2016. Chest radiograph August 31, 2013 FINDINGS: PA chest: There is no edema or consolidation. The heart size and pulmonary vascularity are normal. No adenopathy. There is aortic atherosclerosis. Supine and upright abdomen: There is no appreciable bowel dilatation. There are a few scattered air-fluid levels. There is moderate stool in the colon. There are small pelvic phleboliths. IMPRESSION: Scattered air-fluid levels. Question early ileus or enteritis. There is no appreciable bowel obstruction. No free air. No edema or consolidation.  There is aortic atherosclerosis. Aortic Atherosclerosis (ICD10-I70.0). Electronically Signed   By: Bretta BangWilliam  Woodruff  III M.D.   On: 04/22/2017 15:13         Assessment/Plan 1. Acute pancreatitis w/ septic picture of tachycardia, leukocytosis - NS at125cc/hr . S/p 3LNS bolus in ED. Will include potassium in IVF . Starting zosyn to cover her severe leukocytosis and pancreatitis. NPO at this time for bowel rest. Zofran prn nausea. Morphine for severe pain control.  2. HTN - hold home meds in picture of sepsis 3. Fibromyalgia - home meds 4. Hypokalemia - adding KCl to IVF . Tele . Recheck in 4-6 hours  5. Hypochloremic Hyponatremia - no PO intake in 2 days. Should improve w/ IVF . Recheck in 4-6 hours  6. Dry eye - eyedrops per home meds    Code Status: full Family Communication: mother at bedside  Disposition Plan/Anticipated LOS: to step down unit overnight given the severity of her electrolyte abnormalities and high risk of complication w/ WBC count of 40 in pancreatitis patient w/ septic picture . No dc for several days while she stabilizes   Time spent: 60 minutes  Alysia PennaScott Sheylin Scharnhorst, MD  Internal Medicine Pager [phone] Cell [phone] If 7PM-7AM, please contact night-coverage at [email or web address], password [email or web address] 04/22/2017, 9:02 PM

## 2017-04-22 NOTE — Progress Notes (Signed)
Subjective:    Patient ID: Gloria Carter, female    DOB: 02/16/1966, 51 y.o.   MRN: [id number]  04/22/2017  Abdominal Pain (x 5 days )   HPI This 51 y.o. female presents for evaluation o abdminal pain.  ONset Monday.  +fever Tmax not sure.  +chills/sweats.  No nausea or vomiting; no constipation or diarrhea.  Eating two crackers per day and water for the past three days.  Two bottles of water per day.  24 ounce water bottles.   First episode of pancreatitis in May 2017; missed 5 weeks of work.   Has had two smaller flares since the onset.   Severity 8/10.  Pain is very similar pain to last year with onset.  Severity 9/10.  No dysuria, frequency, hematuria, nocturia.     Review of Systems  Constitutional: Positive for chills, diaphoresis and fatigue. Negative for fever.  Eyes: Negative for visual disturbance.  Respiratory: Negative for cough and shortness of breath.   Cardiovascular: Negative for chest pain, palpitations and leg swelling.  Gastrointestinal: Positive for abdominal distention, abdominal pain and nausea. Negative for anal bleeding, blood in stool, constipation, diarrhea, rectal pain and vomiting.  Endocrine: Negative for cold intolerance, heat intolerance, polydipsia, polyphagia and polyuria.  Genitourinary: Negative for dysuria, flank pain, frequency, hematuria and urgency.  Neurological: Negative for dizziness, tremors, seizures, syncope, facial asymmetry, speech difficulty, weakness, light-headedness, numbness and headaches.    Past Medical History:  Diagnosis Date  . ADD (attention deficit disorder)   . Allergy    ENVIRONMENTAL  . Arthritis    knees  . Asthma   . Depression   . Difficulty sleeping   . Dysphagia   . Fibromyalgia   . Fibromyalgia   . GERD (gastroesophageal reflux disease)   . Hernia of abdominal wall   . Hyperlipidemia   . Hypertension   . IBS (irritable bowel syndrome)   . Migraine   . Numbness and tingling in hands   . Obesity   .  Swelling of both ankles   . Thyroid disease    Past Surgical History:  Procedure Laterality Date  . INSERTION OF MESH N/A 03/28/2014   Procedure: INSERTION OF MESH;  Surgeon: Velora Heckler, MD;  Location: [facility];  Service: General;  Laterality: N/A;  . TONSILECTOMY/ADENOIDECTOMY WITH MYRINGOTOMY    . VENTRAL HERNIA REPAIR N/A 03/28/2014   Procedure: LAPAROSCOPIC VENTRAL HERNIA REPAIR WITH MESH;  Surgeon: Velora Heckler, MD;  Location: [facility];  Service: General;  Laterality: N/A;  . WISDOM TOOTH EXTRACTION     Allergies  Allergen Reactions  . Soap     Any soaps with fragrance cause migraines  . Sulfa Antibiotics Nausea And Vomiting    Social History   Social History  . Marital status: Legally Separated    Spouse name: N/A  . Number of children: 0  . Years of education: N/A   Occupational History  . 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 Smoking status: Never Smoker  . Smokeless tobacco: Never Used  . Alcohol use No  . Drug use: No  . Sexual activity: Yes   Other Topics Concern  . Not on file   Social History Narrative   Married. Education: [employer].    Family History  Problem Relation Age of Onset  . COPD Mother   . Fibromyalgia Mother   . Heart disease Father   . Colon polyps Maternal Grandmother   . Diabetes Paternal Grandfather   .  Heart disease Paternal Grandfather   . Colon polyps Maternal Aunt   . Colon polyps Unknown        ? father  . Diabetes Unknown        MGGF, MGGM  . Diabetes Maternal Uncle   . Aneurysm Maternal Aunt   . Stroke Sister   . Kidney disease Maternal Aunt   . Fibromyalgia Sister   . Fibromyalgia Unknown        cousin  . Colon cancer Neg Hx        Objective:    BP 138/82   Pulse (!) 116   Temp 97.6 F (36.4 C) (Oral)   Resp 16   Ht 5' 2.6" (1.59 m)   Wt 271 lb (122.9 kg)   SpO2 95%   BMI 48.62 kg/m  Physical Exam  Constitutional: She appears well-developed and well-nourished. She appears ill. No  distress.  HENT:  Head: Normocephalic and atraumatic.  Right Ear: External ear normal.  Left Ear: External ear normal.  Nose: Nose normal.  Mouth/Throat: Oropharynx is clear and moist.  Eyes: Pupils are equal, round, and reactive to light. Conjunctivae and EOM are normal.  Neck: Normal range of motion. Neck supple. Carotid bruit is not present. No thyromegaly present.  Cardiovascular: Normal rate, regular rhythm, normal heart sounds and intact distal pulses.  Exam reveals no gallop and no friction rub.   No murmur heard. Pulmonary/Chest: Effort normal and breath sounds normal. She has no wheezes. She has no rales.  Abdominal: Soft. Bowel sounds are normal. She exhibits no distension and no mass. There is tenderness. There is no rebound and no guarding.  Lymphadenopathy:    She has no cervical adenopathy.  Neurological: She is alert. No cranial nerve deficit.  Skin: Skin is warm and dry. No rash noted. She is not diaphoretic. No erythema. No pallor.  Psychiatric: She has a normal mood and affect. Her behavior is normal.         Assessment & Plan:   1. Abdominal pain, diffuse   2. History of pancreatitis   3. Bandemia    -new onset abdominal pain with WBC of 41,000 and ill appearance.  To ED for admission and for CT abdomen/pelvis with STAT labs.  Will warrant admission. -prolonged face-to-face for 40 minutes with greater than 50% of time dedicated to counseling and coordination of care.   Orders Placed This Encounter  Procedures  . DG Abd Acute W/Chest    Standing Status:   Future    Number of Occurrences:   1    Standing Expiration Date:   04/22/2018    Order Specific Question:   Reason for exam:    Answer:   upper abdominal pain, fever, nausea for five days    Order Specific Question:   Is the patient pregnant?    Answer:   No    Order Specific Question:   Preferred imaging location?    Answer:   External  . CT Abdomen Pelvis W Contrast    Patient to wait until report  called    Standing Status:   Future    Standing Expiration Date:   07/23/2018    Order Specific Question:   If indicated for the ordered procedure, I authorize the administration of contrast media per Radiology protocol    Answer:   Yes    Order Specific Question:   Reason for Exam (SYMPTOM  OR DIAGNOSIS REQUIRED)    Answer:   diffuse abdominal pain, fever,  nausea    Order Specific Question:   Is patient pregnant?    Answer:   No    Comments:   menopausal    Order Specific Question:   Preferred imaging location?    Answer:   [facility]    Order Specific Question:   Call Results- Best Contact Number?    Answer:   [phone]    Order Specific Question:   Radiology Contrast Protocol - do NOT remove file path    Answer:   [email or web address]  . Comprehensive metabolic panel  . Lipase  . Amylase  . POCT CBC  . POCT urinalysis dipstick   No orders of the defined types were placed in this encounter.   No Follow-up on file.   Loreto Loescher Paulita FujitaMartin Casia Corti, M.D. [facility]  [place] [facility] [address] [place], [place]  [id number] [phone] phone [phone] fax

## 2017-04-22 NOTE — Progress Notes (Signed)
Pharmacy Antibiotic Note  Gloria GrovesBarbara Carter is a 51 y.o. female presents with severe abdominal pain, CT shows acute pancreatitis admitted on 04/22/2017 with pancreatitis.  Pharmacy has been consulted for zosyn dosing.  Plan: Zosyn 3.375g IV q8h (4 hour infusion).  F/u scr/cultures  Height: 5\' 2"  (157.5 cm) Weight: 274 lb 14.6 oz (124.7 kg) IBW/kg (Calculated) : 50.1  Temp (24hrs), Avg:98.3 F (36.8 C), Min:97.6 F (36.4 C), Max:99.1 F (37.3 C)   Recent Labs Lab 04/22/17 1505 04/22/17 1521 04/22/17 1808 04/22/17 1913  WBC  --  41.9*  --  40.1*  CREATININE 1.15*  --   --  1.43*  LATICACIDVEN  --   --  <0.30*  --     Estimated Creatinine Clearance: 57.4 mL/min (A) (by C-G formula based on SCr of 1.43 mg/dL (H)).    Allergies  Allergen Reactions  . Soap     Any soaps with fragrance cause migraines  . Sulfa Antibiotics Nausea And Vomiting    Antimicrobials this admission: 7/13 zosyn >>    >>   Dose adjustments this admission:   Microbiology results:  BCx:   UCx:    Sputum:    MRSA PCR:   Thank you for allowing pharmacy to be a part of this patient's care.  Lorenza EvangelistGreen, Gloria Newby R 04/22/2017 11:23 PM

## 2017-04-22 NOTE — ED Notes (Signed)
Nurse started antibiotics before realizing cultures had been ordered. PA made aware

## 2017-04-22 NOTE — ED Provider Notes (Signed)
[facility]-EMERGENCY DEPT Provider Note   CSN: [id number] Arrival date & time: 04/22/17  1620     History   Chief Complaint Chief Complaint  Patient presents with  . Pancreatitis  . Abnormal Lab    elevated WBC    HPI Gloria Carter is a 51 y.o. female.  HPI 51 year old Caucasian female past medical history significant for pancreatitis presents to the ER today after referral from primary care doctor for upper abdominal pain and leukocytosis. Patient states that she has developed epigastric abdominal pain that radiates to her back over the past 2 days and has progressively worsened. History of pancreatitis and states this feels similar. She has not tried any further symptoms at home. Nothing makes better or worse. She reports episode of nonbloody nonbilious emesis today. She reports nausea. Denies any diarrhea. Went to her primary care doctor where she had labs done that showed a leukocytosis of 41,000.  Pt denies any fever, chill, ha, vision changes, lightheadedness, dizziness, congestion, neck pain, cp, sob, cough, /d, urinary symptoms, change in bowel habits, melena, hematochezia, lower extremity paresthesias.  Past Medical History:  Diagnosis Date  . ADD (attention deficit disorder)   . Allergy    ENVIRONMENTAL  . Arthritis    knees  . Asthma   . Depression   . Difficulty sleeping   . Dysphagia   . Fibromyalgia   . Fibromyalgia   . GERD (gastroesophageal reflux disease)   . Hernia of abdominal wall   . Hyperlipidemia   . Hypertension   . IBS (irritable bowel syndrome)   . Migraine   . Numbness and tingling in hands   . Obesity   . Swelling of both ankles   . Thyroid disease     Patient Active Problem List   Diagnosis Date Noted  . Pancreatitis 04/22/2017  . Vitamin D deficiency 06/09/2015  . Degeneration of lumbar or lumbosacral intervertebral disc 05/22/2015  . Right lumbar radiculopathy 05/22/2015  . Prediabetes 09/08/2013  . Hypothyroidism 08/31/2013  .  Asthma, chronic 08/31/2013  . Upper airway cough syndrome 08/31/2013  . Carpal tunnel syndrome 06/01/2013  . Wrist tendonitis 06/01/2013  . Encounter for medication monitoring 12/01/2012  . HTN (hypertension) 08/13/2012  . BMI 45.0-49.9, adult (HCC) 08/13/2012  . Hyperlipidemia 08/13/2012  . IBS (irritable bowel syndrome) 08/13/2012  . ADD (attention deficit disorder) 02/07/2012  . GERD (gastroesophageal reflux disease) 02/07/2012  . Migraine 02/07/2012  . Dysphagia 02/07/2012  . Hernia of abdominal wall 02/07/2012  . RAD (reactive airway disease) 12/20/2011  . Fibromyalgia 12/20/2011  . Chronic pain 12/20/2011    Past Surgical History:  Procedure Laterality Date  . INSERTION OF MESH N/A 03/28/2014   Procedure: INSERTION OF MESH;  Surgeon: Velora Heckler, MD;  Location: [facility];  Service: General;  Laterality: N/A;  . TONSILECTOMY/ADENOIDECTOMY WITH MYRINGOTOMY    . VENTRAL HERNIA REPAIR N/A 03/28/2014   Procedure: LAPAROSCOPIC VENTRAL HERNIA REPAIR WITH MESH;  Surgeon: Velora Heckler, MD;  Location: [facility];  Service: General;  Laterality: N/A;  . WISDOM TOOTH EXTRACTION      OB History    No data available       Home Medications    Prior to Admission medications   Medication Sig Start Date End Date Taking? Authorizing Provider  albuterol (PROVENTIL HFA) 108 (90 BASE) MCG/ACT inhaler INHALE TWO PUFFS BY MOUTH EVERY SIX HOURS AS NEEDED Patient taking differently: Inhale 2 puffs into the lungs every 6 (six) hours as needed for wheezing or  shortness of breath. INHALE TWO PUFFS BY MOUTH EVERY SIX HOURS AS NEEDED 08/21/15  Yes Sherren Mocha, MD  amphetamine-dextroamphetamine (ADDERALL) 10 MG tablet Take 1 tablet (10 mg total) by mouth 2 (two) times daily. 01/17/17  Yes Sherren Mocha, MD  azelastine (OPTIVAR) 0.05 % ophthalmic solution PLACE TWO DRIPS INTO BOTH EYES TWICE DAILY Patient taking differently: PLACE TWO DRIPS INTO BOTH EYES TWICE DAILY FOR DRY EYES 09/04/16  Yes Sherren Mocha, MD    B Complex-C-Folic Acid (B-COMPLEX BALANCED PO) Take 1 tablet by mouth daily.    Yes [provider]  calcium carbonate (OS-CAL) 600 MG TABS Take 600 mg by mouth daily.   Yes [provider]  carisoprodol (SOMA) 350 MG tablet Take 1 tablet (350 mg total) by mouth 4 (four) times daily as needed for muscle spasms. 01/17/17  Yes Sherren Mocha, MD  cetirizine (ZYRTEC) 10 MG tablet Take 10 mg by mouth daily as needed for allergies.    Yes [provider]  cholecalciferol (VITAMIN D) 400 UNITS TABS Take 400 Units by mouth daily.   Yes [provider]  citalopram (CELEXA) 20 MG tablet TAKE 1 TABLET (20 MG TOTAL) BY MOUTH DAILY. 01/03/17  Yes Jeffery, Chelle, PA-C  dicyclomine (BENTYL) 20 MG tablet Take 1 tablet (20 mg total) by mouth 3 (three) times daily before meals. As needed for bowel cramping 06/24/16  Yes Sherren Mocha, MD  esomeprazole (NEXIUM) 40 MG capsule TAKE ONE CAPSULE BY MOUTH ONE TIME DAILY Patient taking differently: TAKE ONE CAPSULE BY MOUTH ONE TIME DAILY FOR INDIGESTION 03/01/17  Yes Sherren Mocha, MD  Fluticasone-Salmeterol (ADVAIR DISKUS) 500-50 MCG/DOSE AEPB Inhale 1 puff into the lungs 2 (two) times daily. 01/17/17  Yes Sherren Mocha, MD  montelukast (SINGULAIR) 10 MG tablet Take 1 tablet (10 mg total) by mouth every morning. 06/24/16  Yes Sherren Mocha, MD  Multiple Vitamin (MULTIVITAMIN) tablet Take 1 tablet by mouth daily.   Yes [provider]  ondansetron (ZOFRAN) 4 MG tablet Take 1 tablet (4 mg total) by mouth every 6 (six) hours. Patient taking differently: Take 4 mg by mouth every 8 (eight) hours as needed for nausea or vomiting.  06/24/16  Yes Sherren Mocha, MD  pregabalin (LYRICA) 150 MG capsule Take 1 capsule (150 mg total) by mouth 3 (three) times daily. Patient taking differently: Take 150 mg by mouth 2 (two) times daily.  10/18/16  Yes Sherren Mocha, MD  SUMAtriptan (IMITREX) 100 MG tablet Take 1 tablet (100 mg total) by mouth every 2 (two) hours as  needed for migraine. No more than 2 doses in 24 hours. 06/24/16  Yes Sherren Mocha, MD  SYNTHROID 75 MCG tablet TAKE 1 TABLET (75 MCG TOTAL) BY MOUTH DAILY BEFORE BREAKFAST. 01/28/17  Yes Sherren Mocha, MD  telmisartan-hydrochlorothiazide (MICARDIS HCT) 80-12.5 MG tablet Take 1 tablet by mouth daily. 06/24/16  Yes Sherren Mocha, MD  traMADol (ULTRAM) 50 MG tablet Take 1 tablet (50 mg total) by mouth 4 (four) times daily. 01/17/17  Yes Sherren Mocha, MD  vitamin C (ASCORBIC ACID) 500 MG tablet Take 1,000 mg by mouth daily.   Yes [provider]  amphetamine-dextroamphetamine (ADDERALL) 10 MG tablet Take 1 tablet (10 mg total) by mouth 2 (two) times daily with a meal. Patient not taking: Reported on 04/22/2017 01/17/17   Sherren Mocha, MD  amphetamine-dextroamphetamine (ADDERALL) 10 MG tablet Take 1 tablet (10 mg total) by  mouth 2 (two) times daily. Patient not taking: Reported on 04/22/2017 01/17/17   Sherren Mocha, MD  Polyethyl Glycol-Propyl Glycol (SYSTANE) 0.4-0.3 % SOLN Apply 1 drop to eye 3 (three) times daily as needed (dry eyes).    [provider]  ranitidine (ZANTAC) 150 MG tablet Take 150 mg by mouth 2 (two) times daily as needed for heartburn.    [provider]    Family History Family History  Problem Relation Age of Onset  . COPD Mother   . Fibromyalgia Mother   . Heart disease Father   . Colon polyps Maternal Grandmother   . Diabetes Paternal Grandfather   . Heart disease Paternal Grandfather   . Colon polyps Maternal Aunt   . Colon polyps Unknown        ? father  . Diabetes Unknown        MGGF, MGGM  . Diabetes Maternal Uncle   . Aneurysm Maternal Aunt   . Stroke Sister   . Kidney disease Maternal Aunt   . Fibromyalgia Sister   . Fibromyalgia Unknown        cousin  . Colon cancer Neg Hx     Social History Social History  Substance Use Topics  . Smoking status: Never Smoker  . Smokeless tobacco: Never Used  . Alcohol use No     Allergies   Soap and  Sulfa antibiotics   Review of Systems Review of Systems  Constitutional: Negative for chills and fever.  HENT: Negative for congestion.   Eyes: Negative for visual disturbance.  Respiratory: Negative for cough and shortness of breath.   Cardiovascular: Negative for chest pain.  Gastrointestinal: Positive for abdominal pain, nausea and vomiting. Negative for diarrhea.  Genitourinary: Negative for dysuria, flank pain, frequency, hematuria, urgency, vaginal bleeding and vaginal discharge.  Musculoskeletal: Negative for arthralgias and myalgias.  Skin: Negative for rash.  Neurological: Negative for dizziness, syncope, weakness, light-headedness, numbness and headaches.  Psychiatric/Behavioral: Negative for sleep disturbance. The patient is not nervous/anxious.      Physical Exam Updated Vital Signs BP (!) 129/57 (BP Location: Left Arm)   Pulse 97   Temp 98.5 F (36.9 C) (Axillary)   Resp 20   SpO2 94%   Physical Exam  Constitutional: She is oriented to person, place, and time. She appears well-developed and well-nourished.  Non-toxic appearance. She appears distressed (due to pain).  HENT:  Head: Normocephalic and atraumatic.  Nose: Nose normal.  Mouth/Throat: Oropharynx is clear and moist.  Eyes: Pupils are equal, round, and reactive to light. Conjunctivae are normal. Right eye exhibits no discharge. Left eye exhibits no discharge.  Neck: Normal range of motion. Neck supple.  Cardiovascular: Normal rate, regular rhythm, normal heart sounds and intact distal pulses.   Pulmonary/Chest: Effort normal and breath sounds normal. No respiratory distress. She exhibits no tenderness.  Abdominal: Soft. Bowel sounds are normal. She exhibits no pulsatile midline mass. There is tenderness in the epigastric area. There is no rigidity, no rebound, no guarding, no CVA tenderness, no tenderness at McBurney's point and negative Murphy's sign.  Musculoskeletal: Normal range of motion. She exhibits  no tenderness.  Lymphadenopathy:    She has no cervical adenopathy.  Neurological: She is alert and oriented to person, place, and time.  Skin: Skin is warm and dry. Capillary refill takes less than 2 seconds.  Psychiatric: Her behavior is normal. Judgment and thought content normal.  Nursing note and vitals reviewed.    ED Treatments / Results  Labs (all labs ordered are listed, but only abnormal results are displayed) Labs Reviewed  CBC WITH DIFFERENTIAL/PLATELET - Abnormal; Notable for the following:       Result Value   WBC 40.1 (*)    MCHC 36.3 (*)    Neutro Abs 34.1 (*)    Monocytes Absolute 4.0 (*)    All other components within normal limits  COMPREHENSIVE METABOLIC PANEL - Abnormal; Notable for the following:    Sodium 123 (*)    Potassium 2.5 (*)    Chloride 78 (*)    Glucose, Bld 133 (*)    Creatinine, Ser 1.43 (*)    Calcium 8.5 (*)    Albumin 3.4 (*)    Total Bilirubin 1.6 (*)    GFR calc non Af Amer 41 (*)    GFR calc Af Amer 47 (*)    All other components within normal limits  LACTATE DEHYDROGENASE - Abnormal; Notable for the following:    LDH 278 (*)    All other components within normal limits  LIPASE, BLOOD - Abnormal; Notable for the following:    Lipase 57 (*)    All other components within normal limits  I-STAT CG4 LACTIC ACID, ED - Abnormal; Notable for the following:    Lactic Acid, Venous <0.30 (*)    All other components within normal limits  CULTURE, BLOOD (ROUTINE X 2)  CULTURE, BLOOD (ROUTINE X 2)  CBC WITH DIFFERENTIAL/PLATELET    EKG  EKG Interpretation None       Radiology Ct Abdomen Pelvis W Contrast  Result Date: 04/22/2017 CLINICAL DATA:  Epigastric abdominal pain.  Leukocytosis. EXAM: CT ABDOMEN AND PELVIS WITH CONTRAST TECHNIQUE: Multidetector CT imaging of the abdomen and pelvis was performed using the standard protocol following bolus administration of intravenous contrast. CONTRAST:  100mL ISOVUE-300 IOPAMIDOL  (ISOVUE-300) INJECTION 61% COMPARISON:  Abdominal radiographs from earlier today. FINDINGS: Lower chest: No significant pulmonary nodules or acute consolidative airspace disease. Hepatobiliary: Diffuse hepatic steatosis. No definite liver surface irregularity. No liver mass. Normal gallbladder with no radiopaque cholelithiasis. No biliary ductal dilatation. Pancreas: Diffuse pancreatic parenchymal thickening with prominent diffuse peripancreatic fat stranding and ill-defined fluid, compatible with acute pancreatitis. No definite regions of pancreatic parenchymal nonenhancement or pancreatic parenchymal gas to suggest necrotizing pancreatitis. No measurable peripancreatic fluid collections. No pancreatic mass or duct dilation. Spleen: Normal size spleen. Punctate scattered granulomatous splenic calcifications. No splenic mass. Adrenals/Urinary Tract: Normal adrenals. Hydronephrosis. Simple 1.9 cm lateral interpolar right renal cyst. Normal bladder. Stomach/Bowel: Grossly normal stomach. Normal caliber small bowel with no small bowel wall thickening. Normal appendix. Minimal sigmoid diverticulosis, with no large bowel wall thickening or pericolonic fat stranding. Vascular/Lymphatic: Atherosclerotic nonaneurysmal abdominal aorta. Patent portal, splenic, hepatic and renal veins. No pathologically enlarged lymph nodes in the abdomen or pelvis. Reproductive: Grossly normal uterus.  No adnexal mass. Other: No pneumoperitoneum, ascites or focal fluid collection. Small fat containing supraumbilical midline ventral abdominal hernia. Musculoskeletal: No aggressive appearing focal osseous lesions. Mild thoracolumbar spondylosis. IMPRESSION: 1. Prominent acute pancreatitis. No evidence of necrotizing pancreatitis or measurable peripancreatic fluid collections. 2. No radiopaque cholelithiasis.  No biliary ductal dilatation. 3. Diffuse hepatic steatosis. 4.  Aortic Atherosclerosis (ICD10-I70.0). 5. Small fat containing  supraumbilical midline ventral abdominal hernia . Electronically Signed   By: Delbert PhenixJason A Poff M.D.   On: 04/22/2017 20:10   Dg Abd Acute W/chest  Result Date: 04/22/2017 CLINICAL DATA:  Abdominal pain and nausea; fever EXAM: DG ABDOMEN ACUTE W/ 1V CHEST COMPARISON:  CT  abdomen and pelvis February 03, 2016. Chest radiograph August 31, 2013 FINDINGS: PA chest: There is no edema or consolidation. The heart size and pulmonary vascularity are normal. No adenopathy. There is aortic atherosclerosis. Supine and upright abdomen: There is no appreciable bowel dilatation. There are a few scattered air-fluid levels. There is moderate stool in the colon. There are small pelvic phleboliths. IMPRESSION: Scattered air-fluid levels. Question early ileus or enteritis. There is no appreciable bowel obstruction. No free air. No edema or consolidation.  There is aortic atherosclerosis. Aortic Atherosclerosis (ICD10-I70.0). Electronically Signed   By: Bretta Bang III M.D.   On: 04/22/2017 15:13    Procedures Procedures (including critical care time)  Medications Ordered in ED Medications  iopamidol (ISOVUE-300) 61 % injection (  Hold 04/22/17 2029)  morphine 4 MG/ML injection 4 mg (4 mg Intravenous Given 04/22/17 1841)  metoCLOPramide (REGLAN) injection 10 mg (10 mg Intravenous Given 04/22/17 1841)  sodium chloride 0.9 % bolus 1,000 mL (0 mLs Intravenous Stopped 04/22/17 2159)  iopamidol (ISOVUE-300) 61 % injection 100 mL (100 mLs Intravenous Contrast Given 04/22/17 1945)  sodium chloride 0.9 % bolus 1,000 mL (0 mLs Intravenous Stopped 04/22/17 2159)  potassium chloride 10 mEq in 100 mL IVPB (0 mEq Intravenous Stopped 04/22/17 2120)  fentaNYL (SUBLIMAZE) injection 50 mcg (50 mcg Intravenous Given 04/22/17 2153)  ondansetron (ZOFRAN) injection 4 mg (4 mg Intravenous Given 04/22/17 2153)  piperacillin-tazobactam (ZOSYN) IVPB 3.375 g (0 g Intravenous Stopped 04/22/17 2214)     Initial Impression / Assessment and Plan / ED  Course  I have reviewed the triage vital signs and the nursing notes.  Pertinent labs & imaging results that were available during my care of the patient were reviewed by me and considered in my medical decision making (see chart for details).     Patient resents to the ED with complaints of epigastric abdominal pain. Referred by PCP with elevated white blood cell count and history of pancreatitis. On exam patient doesn't epigastric abdominal pain. Vital signs are reassuring. No fever. She is not tachycardic or hypotensive. No signs of peritonitis on exam. She does have a leukocytosis of 41,000. Lipase is normal. LDH is 278. Patient with hypokalemia of 2.5. This was replaced with IV potassium. She also has a hyponatremia of 123. Mild elevated creatinine of 1.4. Patient given fluids. Pain was improved with medication. Nausea was improved with Reglan. CT scan does show acute pancreatitis. Patients Ranson criteria is 1. Overall patient is nontoxic appearing. Doesn't appear to be in some acute distress due to pain.  Discussed with Dr. Link Snuffer who agrees to admission and we'll place admission orders. Patient is currently hemodynamically stable in no acute distress at this time. He was up-to-date on plan of care and are agreeable to admission. Patient was discussed with Dr. Freida Busman who is agreeable to the above plan.  Final Clinical Impressions(s) / ED Diagnoses   Final diagnoses:  Acute pancreatitis without infection or necrosis, unspecified pancreatitis type  Hypokalemia  Hyponatremia    New Prescriptions Current Discharge Medication List       Wallace Keller 04/22/17 2240    Lorre Nick, MD 04/22/17 2351

## 2017-04-23 DIAGNOSIS — K859 Acute pancreatitis without necrosis or infection, unspecified: Secondary | ICD-10-CM

## 2017-04-23 DIAGNOSIS — E876 Hypokalemia: Secondary | ICD-10-CM

## 2017-04-23 DIAGNOSIS — E871 Hypo-osmolality and hyponatremia: Secondary | ICD-10-CM

## 2017-04-23 LAB — BLOOD CULTURE ID PANEL (REFLEXED)
Acinetobacter baumannii: NOT DETECTED
Candida albicans: NOT DETECTED
Candida glabrata: NOT DETECTED
Candida krusei: NOT DETECTED
Candida parapsilosis: NOT DETECTED
Candida tropicalis: NOT DETECTED
ENTEROBACTER CLOACAE COMPLEX: NOT DETECTED
ENTEROBACTERIACEAE SPECIES: NOT DETECTED
Enterococcus species: NOT DETECTED
Escherichia coli: NOT DETECTED
HAEMOPHILUS INFLUENZAE: NOT DETECTED
KLEBSIELLA OXYTOCA: NOT DETECTED
KLEBSIELLA PNEUMONIAE: NOT DETECTED
Listeria monocytogenes: NOT DETECTED
NEISSERIA MENINGITIDIS: NOT DETECTED
Proteus species: NOT DETECTED
Pseudomonas aeruginosa: NOT DETECTED
STAPHYLOCOCCUS AUREUS BCID: NOT DETECTED
STAPHYLOCOCCUS SPECIES: NOT DETECTED
STREPTOCOCCUS AGALACTIAE: NOT DETECTED
STREPTOCOCCUS PNEUMONIAE: NOT DETECTED
STREPTOCOCCUS SPECIES: NOT DETECTED
Serratia marcescens: NOT DETECTED
Streptococcus pyogenes: NOT DETECTED

## 2017-04-23 LAB — CBC
HCT: 36.1 % (ref 36.0–46.0)
HEMATOCRIT: 34.3 % — AB (ref 36.0–46.0)
HEMOGLOBIN: 12.9 g/dL (ref 12.0–15.0)
HEMOGLOBIN: 12.2 g/dL (ref 12.0–15.0)
MCH: 29.7 pg (ref 26.0–34.0)
MCH: 29.9 pg (ref 26.0–34.0)
MCHC: 35.7 g/dL (ref 30.0–36.0)
MCHC: 35.6 g/dL (ref 30.0–36.0)
MCV: 83.2 fL (ref 78.0–100.0)
MCV: 84.1 fL (ref 78.0–100.0)
PLATELETS: 215 10*3/uL (ref 150–400)
Platelets: 205 10*3/uL (ref 150–400)
RBC: 4.34 MIL/uL (ref 3.87–5.11)
RBC: 4.08 MIL/uL (ref 3.87–5.11)
RDW: 12.8 % (ref 11.5–15.5)
RDW: 13 % (ref 11.5–15.5)
WBC: 36.2 10*3/uL — ABNORMAL HIGH (ref 4.0–10.5)
WBC: 27.3 10*3/uL — AB (ref 4.0–10.5)

## 2017-04-23 LAB — BASIC METABOLIC PANEL
ANION GAP: 11 (ref 5–15)
BUN: 13 mg/dL (ref 6–20)
CO2: 30 mmol/L (ref 22–32)
Calcium: 8 mg/dL — ABNORMAL LOW (ref 8.9–10.3)
Chloride: 86 mmol/L — ABNORMAL LOW (ref 101–111)
Creatinine, Ser: 0.78 mg/dL (ref 0.44–1.00)
GLUCOSE: 124 mg/dL — AB (ref 65–99)
POTASSIUM: 3.1 mmol/L — AB (ref 3.5–5.1)
Sodium: 127 mmol/L — ABNORMAL LOW (ref 135–145)

## 2017-04-23 LAB — COMPREHENSIVE METABOLIC PANEL
ALK PHOS: 108 U/L (ref 38–126)
ALT: 23 U/L (ref 14–54)
ANION GAP: 12 (ref 5–15)
AST: 28 U/L (ref 15–41)
Albumin: 3.1 g/dL — ABNORMAL LOW (ref 3.5–5.0)
BILIRUBIN TOTAL: 1.8 mg/dL — AB (ref 0.3–1.2)
BUN: 18 mg/dL (ref 6–20)
CALCIUM: 7.9 mg/dL — AB (ref 8.9–10.3)
CO2: 29 mmol/L (ref 22–32)
Chloride: 81 mmol/L — ABNORMAL LOW (ref 101–111)
Creatinine, Ser: 0.96 mg/dL (ref 0.44–1.00)
Glucose, Bld: 130 mg/dL — ABNORMAL HIGH (ref 65–99)
Potassium: 2.4 mmol/L — CL (ref 3.5–5.1)
Sodium: 122 mmol/L — ABNORMAL LOW (ref 135–145)
TOTAL PROTEIN: 6.7 g/dL (ref 6.5–8.1)

## 2017-04-23 LAB — MRSA PCR SCREENING: MRSA by PCR: NEGATIVE

## 2017-04-23 LAB — MAGNESIUM: MAGNESIUM: 1.6 mg/dL — AB (ref 1.7–2.4)

## 2017-04-23 LAB — HIV ANTIBODY (ROUTINE TESTING W REFLEX): HIV Screen 4th Generation wRfx: NONREACTIVE

## 2017-04-23 MED ORDER — POTASSIUM CHLORIDE CRYS ER 20 MEQ PO TBCR
40.0000 meq | EXTENDED_RELEASE_TABLET | Freq: Once | ORAL | Status: AC
  Administered 2017-04-23: 40 meq via ORAL
  Filled 2017-04-23: qty 2

## 2017-04-23 MED ORDER — HYDRALAZINE HCL 20 MG/ML IJ SOLN
10.0000 mg | INTRAMUSCULAR | Status: DC | PRN
  Filled 2017-04-23: qty 0.5

## 2017-04-23 MED ORDER — MAGNESIUM SULFATE IN D5W 1-5 GM/100ML-% IV SOLN
1.0000 g | Freq: Once | INTRAVENOUS | Status: AC
  Administered 2017-04-23: 1 g via INTRAVENOUS
  Filled 2017-04-23: qty 100

## 2017-04-23 MED ORDER — VANCOMYCIN HCL 10 G IV SOLR
2000.0000 mg | Freq: Once | INTRAVENOUS | Status: AC
  Administered 2017-04-23: 2000 mg via INTRAVENOUS
  Filled 2017-04-23: qty 2000

## 2017-04-23 MED ORDER — POTASSIUM CHLORIDE CRYS ER 20 MEQ PO TBCR
30.0000 meq | EXTENDED_RELEASE_TABLET | Freq: Two times a day (BID) | ORAL | Status: DC
  Administered 2017-04-23 (×3): 30 meq via ORAL
  Filled 2017-04-23 (×3): qty 1

## 2017-04-23 MED ORDER — MAGNESIUM SULFATE 2 GM/50ML IV SOLN
2.0000 g | Freq: Once | INTRAVENOUS | Status: AC
Start: 2017-04-23 — End: 2017-04-23
  Administered 2017-04-23: 2 g via INTRAVENOUS

## 2017-04-23 MED ORDER — VANCOMYCIN HCL 10 G IV SOLR
1250.0000 mg | Freq: Two times a day (BID) | INTRAVENOUS | Status: DC
  Administered 2017-04-24 – 2017-04-26 (×5): 1250 mg via INTRAVENOUS
  Filled 2017-04-23 (×6): qty 1250

## 2017-04-23 NOTE — Progress Notes (Signed)
K pad applied to pt's back. Pt ask to have it turned up as high as possible. Turned k pad up once but not to high and educated pt on burning the skin if temp too high.

## 2017-04-23 NOTE — Progress Notes (Signed)
CRITICAL VALUE ALERT  Critical Value:  K+ 2.4  Date & Time Notied:  04/23/17 0255  Provider Notified: Traid  Orders Received/Actions taken: K+ replacement PO

## 2017-04-23 NOTE — Progress Notes (Signed)
Encouraged pt to get up and sit in chair but she states her abdomin hurts too much at this time.

## 2017-04-23 NOTE — Progress Notes (Signed)
PROGRESS NOTE                                                                                                                                                                                                             Patient Demographics:    Gloria Carter, is a 51 y.o. female, DOB - [birth year], XLK:[id number]  Admit date - 04/22/2017   Admitting Physician Alysia Penna, MD  Outpatient Primary MD for the patient is Sherren Mocha, MD  LOS - 1  Outpatient Specialists: none  Chief Complaint  Patient presents with  . Pancreatitis  . Abnormal Lab    elevated WBC       Brief Narrative   51 year old obese female with history of pancreatitis of unclear etiology, ADD, fibromyalgia on multiple medications, hypertension , hyperlipidemia, hypothyroidism and GERD who presented with 3 day history of severe epigastric pain radiating to the back for the past 3 days and progressively worsened. She has not been able to eat or drink because of that. Reports that symptoms were similar to her prior pancreatitis. No relieving factors. She also had an episode of nonbilious vomiting on the day of admission. Denies any fever, chills, headache, dizziness, shortness of breath, chest pain, dysuria or diarrhea. She went to her PCP where labs were done showing wbc of 41,000. She was then sent to the ED. She was tachycardic and tachypneic in the ED. Blood work repeated showed WBC of 40,000, chemistry showed sodium of 123, potassium of 2.5, chloride of 78, acute kidney injury with creatinine of 1.43, normal lactic acid. LFTs were normal. Lipase was only 57.. CT of the abdomen showed acute enteritis without necrotizing, abscess or fluid collection. No biliary duct dilatation or gallstones. Patient admitted to stepdown unit for SIRS due to acute pancreatitis    Subjective:   Patient reports feeling nauseous. Still having epigastric pain but slightly  better since admission. No further vomiting.  Assessment  & Plan :   Principal problem SIRS with acute on chronic pancreatitis. Etiology unclear. Denies any new medications or recent illness. CT without pancreatic abscess, necrosis or fluid. No gallstones or biliary dilatation. She had a normal HIDA scan in 2017 when she had acute pancreatitis. Continue step down monitoring. Continue aggressive IV hydration. Control with when necessary IV morphine every 2 hours as needed. Supportive care with antiemetics.  Serial abdominal exam. Will start her on clear liquids later this evening if pain better controlled. Given her recurrent symptoms we'll start her on Creon.  Leukocytosis Suspect this is likely due to severe acute pancreatitis with SIRS. Recheck WBC this afternoon and if improved and blood cultures negative, can discontinue antibiotics.  Severe hyponatremia Secondary to severe dehydration and HCTZ use. Medication discontinued. Monitor labs closely with IV fluids.  hypokalemia and hypomagnesemia Replenish aggressively with by mouth and IV. Recheck this afternoon. Has some sinus arrhythmia on telemetry.  Chronic fibromyalgia Resume home medications  Depression Continue Celexa.  Hypothyroidism Continue Synthroid  Morbid obesity (BMI of 50) Needs counseling on diet and excised to loose weight.    Code Status : Full code  Family Communication  : Father at bedside  Disposition Plan  : Home once better  Barriers For Discharge : Active symptoms  Consults  :  None  Procedures  : CT abdomen  DVT Prophylaxis  :  Lovenox -   Lab Results  Component Value Date   PLT 215 04/23/2017    Antibiotics  :    Anti-infectives    Start     Dose/Rate Route Frequency Ordered Stop   04/23/17 0600  piperacillin-tazobactam (ZOSYN) IVPB 3.375 g     3.375 g 12.5 mL/hr over 240 Minutes Intravenous Every 8 hours 04/22/17 2320     04/22/17 2115  piperacillin-tazobactam (ZOSYN) IVPB 3.375 g       3.375 g 100 mL/hr over 30 Minutes Intravenous  Once 04/22/17 2106 04/22/17 2223        Objective:   Vitals:   04/23/17 0600 04/23/17 0700 04/23/17 0800 04/23/17 1000  BP: (!) 149/76 116/63 (!) 170/77 139/71  Pulse: 95 (!) 51 (!) 112 (!) 34  Resp: 19 15 16 20   Temp:      TempSrc:      SpO2: 91% 94% 97% 91%  Weight:      Height:        Wt Readings from Last 3 Encounters:  04/23/17 124.7 kg (274 lb 14.6 oz)  04/22/17 122.9 kg (271 lb)  01/17/17 124.3 kg (274 lb)     Intake/Output Summary (Last 24 hours) at 04/23/17 1039 Last data filed at 04/23/17 0914  Gross per 24 hour  Intake          3545.84 ml  Output                0 ml  Net          3545.84 ml     Physical Exam  ONG:EXBMW obese female not in distress HEENT: no pallor, dry mucosa, supple neck Chest: clear b/l, no added sounds CVS: N S1&S2, no murmurs, rubs or gallop GI: soft, distended, bowel sounds present, epigastric tenderness Musculoskeletal: warm, no edema     Data Review:    CBC  Recent Labs Lab 04/22/17 1521 04/22/17 1913 04/23/17 0137  WBC 41.9* 40.1* 36.2*  HGB 15.2 14.5 12.9  HCT 44.6 39.9 36.1  PLT  --  220 215  MCV 86.7 83.5 83.2  MCH 29.6 30.3 29.7  MCHC 34.1 36.3* 35.7  RDW  --  12.9 12.8  LYMPHSABS  --  2.0  --   MONOABS  --  4.0*  --   EOSABS  --  0.0  --   BASOSABS  --  0.0  --     Chemistries   Recent Labs Lab 04/22/17 1505 04/22/17 1913 04/23/17 0137 04/23/17 0206  NA 121* 123* 122*  --   K 2.8* 2.5* 2.4*  --   CL 79* 78* 81*  --   CO2 31* 31 29  --   GLUCOSE 146* 133* 130*  --   BUN 14 19 18   --   CREATININE 1.15* 1.43* 0.96  --   CALCIUM 9.6 8.5* 7.9*  --   MG  --   --   --  1.6*  AST 27 28 28   --   ALT 21 22 23   --   ALKPHOS 146* 115 108  --   BILITOT 1.6* 1.6* 1.8*  --    ------------------------------------------------------------------------------------------------------------------ No results for input(s): CHOL, HDL, LDLCALC, TRIG, CHOLHDL,  LDLDIRECT in the last 72 hours.  Lab Results  Component Value Date   HGBA1C 5.6 10/18/2016   ------------------------------------------------------------------------------------------------------------------ No results for input(s): TSH, T4TOTAL, T3FREE, THYROIDAB in the last 72 hours.  Invalid input(s): FREET3 ------------------------------------------------------------------------------------------------------------------ No results for input(s): VITAMINB12, FOLATE, FERRITIN, TIBC, IRON, RETICCTPCT in the last 72 hours.  Coagulation profile No results for input(s): INR, PROTIME in the last 168 hours.  No results for input(s): DDIMER in the last 72 hours.  Cardiac Enzymes No results for input(s): CKMB, TROPONINI, MYOGLOBIN in the last 168 hours.  Invalid input(s): CK ------------------------------------------------------------------------------------------------------------------ No results found for: BNP  Inpatient Medications  Scheduled Meds: . carisoprodol  350 mg Oral QID  . citalopram  20 mg Oral Daily  . dicyclomine  20 mg Oral TID AC  . docusate sodium  100 mg Oral BID  . enoxaparin (LOVENOX) injection  60 mg Subcutaneous QHS  . ketotifen  2 drop Both Eyes BID  . levothyroxine  75 mcg Oral QAC breakfast  . mometasone-formoterol  2 puff Inhalation BID  . montelukast  10 mg Oral q morning - 10a  . pantoprazole  40 mg Oral Daily  . potassium chloride  30 mEq Oral BID  . pregabalin  150 mg Oral BID  . sodium chloride flush  3 mL Intravenous Q12H   Continuous Infusions: . 0.9 % NaCl with KCl 40 mEq / L Stopped (04/23/17 0914)  . piperacillin-tazobactam (ZOSYN)  IV Stopped (04/23/17 0909)   PRN Meds:.albuterol, hydrALAZINE, morphine injection, ondansetron **OR** ondansetron (ZOFRAN) IV, polyethylene glycol, polyvinyl alcohol, traMADol  Micro Results Recent Results (from the past 240 hour(s))  Culture, blood (Routine X 2) w Reflex to ID Panel     Status: None  (Preliminary result)   Collection Time: 04/22/17 10:48 PM  Result Value Ref Range Status   Specimen Description BLOOD LEFT ANTECUBITAL  Final   Special Requests   Final    BOTTLES DRAWN AEROBIC AND ANAEROBIC Blood Culture results may not be optimal due to an excessive volume of blood received in culture bottles Performed at [facility], [address] [place], [place] [address]    Culture PENDING  Incomplete   Report Status PENDING  Incomplete  Culture, blood (Routine X 2) w Reflex to ID Panel     Status: None (Preliminary result)   Collection Time: 04/22/17 10:48 PM  Result Value Ref Range Status   Specimen Description BLOOD BLOOD RIGHT HAND  Final   Special Requests   Final    IN PEDIATRIC BOTTLE Blood Culture adequate volume Performed at [facility], [address] [place], [place] [address]    Culture PENDING  Incomplete   Report Status PENDING  Incomplete  MRSA PCR Screening     Status: None   Collection Time:  04/22/17 10:50 PM  Result Value Ref Range Status   MRSA by PCR NEGATIVE NEGATIVE Final    Comment:        The GeneXpert MRSA Assay (FDA approved for NASAL specimens only), is one component of a comprehensive MRSA colonization surveillance program. It is not intended to diagnose MRSA infection nor to guide or monitor treatment for MRSA infections.     Radiology Reports Ct Abdomen Pelvis W Contrast  Result Date: 04/22/2017 CLINICAL DATA:  Epigastric abdominal pain.  Leukocytosis. EXAM: CT ABDOMEN AND PELVIS WITH CONTRAST TECHNIQUE: Multidetector CT imaging of the abdomen and pelvis was performed using the standard protocol following bolus administration of intravenous contrast. CONTRAST:  100mL ISOVUE-300 IOPAMIDOL (ISOVUE-300) INJECTION 61% COMPARISON:  Abdominal radiographs from earlier today. FINDINGS: Lower chest: No significant pulmonary nodules or acute consolidative airspace disease. Hepatobiliary: Diffuse hepatic steatosis. No definite liver  surface irregularity. No liver mass. Normal gallbladder with no radiopaque cholelithiasis. No biliary ductal dilatation. Pancreas: Diffuse pancreatic parenchymal thickening with prominent diffuse peripancreatic fat stranding and ill-defined fluid, compatible with acute pancreatitis. No definite regions of pancreatic parenchymal nonenhancement or pancreatic parenchymal gas to suggest necrotizing pancreatitis. No measurable peripancreatic fluid collections. No pancreatic mass or duct dilation. Spleen: Normal size spleen. Punctate scattered granulomatous splenic calcifications. No splenic mass. Adrenals/Urinary Tract: Normal adrenals. Hydronephrosis. Simple 1.9 cm lateral interpolar right renal cyst. Normal bladder. Stomach/Bowel: Grossly normal stomach. Normal caliber small bowel with no small bowel wall thickening. Normal appendix. Minimal sigmoid diverticulosis, with no large bowel wall thickening or pericolonic fat stranding. Vascular/Lymphatic: Atherosclerotic nonaneurysmal abdominal aorta. Patent portal, splenic, hepatic and renal veins. No pathologically enlarged lymph nodes in the abdomen or pelvis. Reproductive: Grossly normal uterus.  No adnexal mass. Other: No pneumoperitoneum, ascites or focal fluid collection. Small fat containing supraumbilical midline ventral abdominal hernia. Musculoskeletal: No aggressive appearing focal osseous lesions. Mild thoracolumbar spondylosis. IMPRESSION: 1. Prominent acute pancreatitis. No evidence of necrotizing pancreatitis or measurable peripancreatic fluid collections. 2. No radiopaque cholelithiasis.  No biliary ductal dilatation. 3. Diffuse hepatic steatosis. 4.  Aortic Atherosclerosis (ICD10-I70.0). 5. Small fat containing supraumbilical midline ventral abdominal hernia . Electronically Signed   By: Delbert PhenixJason A Poff M.D.   On: 04/22/2017 20:10   Dg Abd Acute W/chest  Result Date: 04/22/2017 CLINICAL DATA:  Abdominal pain and nausea; fever EXAM: DG ABDOMEN ACUTE W/ 1V  CHEST COMPARISON:  CT abdomen and pelvis February 03, 2016. Chest radiograph August 31, 2013 FINDINGS: PA chest: There is no edema or consolidation. The heart size and pulmonary vascularity are normal. No adenopathy. There is aortic atherosclerosis. Supine and upright abdomen: There is no appreciable bowel dilatation. There are a few scattered air-fluid levels. There is moderate stool in the colon. There are small pelvic phleboliths. IMPRESSION: Scattered air-fluid levels. Question early ileus or enteritis. There is no appreciable bowel obstruction. No free air. No edema or consolidation.  There is aortic atherosclerosis. Aortic Atherosclerosis (ICD10-I70.0). Electronically Signed   By: Bretta BangWilliam  Woodruff III M.D.   On: 04/22/2017 15:13    Time Spent in minutes  35   Eddie NorthHUNGEL, Krayton Wortley M.D on 04/23/2017 at 10:39 AM  Between 7am to 7pm - Pager - [phone]  After 7pm go to [email or web address] - password [email or web address]  [facility] -  Office  [phone]

## 2017-04-23 NOTE — Progress Notes (Signed)
PHARMACY - PHYSICIAN COMMUNICATION CRITICAL VALUE ALERT - BLOOD CULTURE IDENTIFICATION (BCID)  Results for orders placed or performed during the hospital encounter of 04/22/17  Blood Culture ID Panel (Reflexed) (Collected: 04/22/2017 10:48 PM)  Result Value Ref Range   Enterococcus species NOT DETECTED NOT DETECTED   Listeria monocytogenes NOT DETECTED NOT DETECTED   Staphylococcus species NOT DETECTED NOT DETECTED   Staphylococcus aureus NOT DETECTED NOT DETECTED   Streptococcus species NOT DETECTED NOT DETECTED   Streptococcus agalactiae NOT DETECTED NOT DETECTED   Streptococcus pneumoniae NOT DETECTED NOT DETECTED   Streptococcus pyogenes NOT DETECTED NOT DETECTED   Acinetobacter baumannii NOT DETECTED NOT DETECTED   Enterobacteriaceae species NOT DETECTED NOT DETECTED   Enterobacter cloacae complex NOT DETECTED NOT DETECTED   Escherichia coli NOT DETECTED NOT DETECTED   Klebsiella oxytoca NOT DETECTED NOT DETECTED   Klebsiella pneumoniae NOT DETECTED NOT DETECTED   Proteus species NOT DETECTED NOT DETECTED   Serratia marcescens NOT DETECTED NOT DETECTED   Haemophilus influenzae NOT DETECTED NOT DETECTED   Neisseria meningitidis NOT DETECTED NOT DETECTED   Pseudomonas aeruginosa NOT DETECTED NOT DETECTED   Candida albicans NOT DETECTED NOT DETECTED   Candida glabrata NOT DETECTED NOT DETECTED   Candida krusei NOT DETECTED NOT DETECTED   Candida parapsilosis NOT DETECTED NOT DETECTED   Candida tropicalis NOT DETECTED NOT DETECTED    Name of physician (or Provider) Contacted: Langston MaskerKaren Sofia, PA  Changes to prescribed antibiotics required: start vancomycin  Vancomycin 2000 mg IV x1, then vancomycin 1250 mg IV q12h  - Daily SCr as pt also is on Zosyn    Adalberto ColeNikola Juanna Pudlo, PharmD, BCPS Pager [phone] 04/23/2017 8:04 PM

## 2017-04-24 ENCOUNTER — Encounter (HOSPITAL_COMMUNITY): Payer: Self-pay

## 2017-04-24 DIAGNOSIS — A419 Sepsis, unspecified organism: Secondary | ICD-10-CM

## 2017-04-24 DIAGNOSIS — R7881 Bacteremia: Secondary | ICD-10-CM

## 2017-04-24 LAB — COMPREHENSIVE METABOLIC PANEL
ALK PHOS: 96 U/L (ref 38–126)
ALT: 21 U/L (ref 14–54)
AST: 21 U/L (ref 15–41)
Albumin: 2.5 g/dL — ABNORMAL LOW (ref 3.5–5.0)
Anion gap: 9 (ref 5–15)
BUN: 9 mg/dL (ref 6–20)
CALCIUM: 7.9 mg/dL — AB (ref 8.9–10.3)
CO2: 29 mmol/L (ref 22–32)
CREATININE: 0.64 mg/dL (ref 0.44–1.00)
Chloride: 91 mmol/L — ABNORMAL LOW (ref 101–111)
Glucose, Bld: 129 mg/dL — ABNORMAL HIGH (ref 65–99)
Potassium: 3.5 mmol/L (ref 3.5–5.1)
Sodium: 129 mmol/L — ABNORMAL LOW (ref 135–145)
Total Bilirubin: 1 mg/dL (ref 0.3–1.2)
Total Protein: 5.9 g/dL — ABNORMAL LOW (ref 6.5–8.1)

## 2017-04-24 LAB — CBC
HCT: 32.9 % — ABNORMAL LOW (ref 36.0–46.0)
Hemoglobin: 11.2 g/dL — ABNORMAL LOW (ref 12.0–15.0)
MCH: 28.9 pg (ref 26.0–34.0)
MCHC: 34 g/dL (ref 30.0–36.0)
MCV: 85 fL (ref 78.0–100.0)
PLATELETS: 196 10*3/uL (ref 150–400)
RBC: 3.87 MIL/uL (ref 3.87–5.11)
RDW: 13 % (ref 11.5–15.5)
WBC: 21.7 10*3/uL — ABNORMAL HIGH (ref 4.0–10.5)

## 2017-04-24 MED ORDER — MORPHINE SULFATE (PF) 4 MG/ML IV SOLN
2.0000 mg | INTRAVENOUS | Status: DC | PRN
  Administered 2017-04-24 – 2017-04-25 (×2): 2 mg via INTRAVENOUS
  Filled 2017-04-24 (×3): qty 1

## 2017-04-24 MED ORDER — POTASSIUM CHLORIDE CRYS ER 20 MEQ PO TBCR
40.0000 meq | EXTENDED_RELEASE_TABLET | Freq: Once | ORAL | Status: AC
  Administered 2017-04-24: 40 meq via ORAL
  Filled 2017-04-24: qty 2

## 2017-04-24 NOTE — Progress Notes (Signed)
PROGRESS NOTE                                                                                                                                                                                                             Patient Demographics:    Gloria Carter, is a 51 y.o. female, DOB - November 05, 1965, ZOX:[id number]  Admit date - 04/22/2017   Admitting Physician Alysia Penna, MD  Outpatient Primary MD for the patient is Sherren Mocha, MD  LOS - 2  Outpatient Specialists: none  Chief Complaint  Patient presents with  . Pancreatitis  . Abnormal Lab    elevated WBC       Brief Narrative   51 year old obese female with history of pancreatitis of unclear etiology, ADD, fibromyalgia on multiple medications, hypertension , hyperlipidemia, hypothyroidism and GERD who presented with 3 day history of severe epigastric pain radiating to the back for the past 3 days and progressively worsened. She has not been able to eat or drink because of that. Reports that symptoms were similar to her prior pancreatitis. No relieving factors. She also had an episode of nonbilious vomiting on the day of admission. Denies any fever, chills, headache, dizziness, shortness of breath, chest pain, dysuria or diarrhea. She went to her PCP where labs were done showing wbc of 41,000. She was then sent to the ED. She was tachycardic and tachypneic in the ED. Blood work repeated showed WBC of 40,000, chemistry showed sodium of 123, potassium of 2.5, chloride of 78, acute kidney injury with creatinine of 1.43, normal lactic acid. LFTs were normal. Lipase was only 57.. CT of the abdomen showed acute enteritis without necrotizing, abscess or fluid collection. No biliary duct dilatation or gallstones. Patient admitted to stepdown unit for SIRS due to acute pancreatitis    Subjective:   No further nausea or vomiting or diarrhea. Abdominal pain slowly getting  better. Tolerated clear liquid. Blood culture from admission growing gram-positive rods. Added vancomycin.  Assessment  & Plan :   Principal problem Sepsis with acute on chronic pancreatitis. Etiology unclear. Denies any new medications or recent illness. CT without pancreatic abscess, necrosis or fluid. No gallstones or biliary dilatation. She had a normal HIDA scan in 2017 when she had acute pancreatitis. Sepsis resolving. Continue IV hydration, IV morphine when necessary for pain, supportive care with antiemetics  and serial abdominal exam. Advance diet to full liquid.  Transferred to telemetry.  Sepsis with gram-positive bacteremia No clear source. Follow final culture and sensitivity. Was already on empiric Zosyn, added vancomycin.  Severe hyponatremia Secondary to severe dehydration and HCTZ use. Improving with IV fluids. HCTZ discontinued.  hypokalemia and hypomagnesemia Secondary to GI loss. Replenished aggressively.  Chronic fibromyalgia  New home medications  Depression Continue Celexa.  Hypothyroidism Continue Synthroid  Morbid obesity (BMI of 50) Needs counseling on diet and excised to loose weight.    Code Status : Full code  Family Communication  : Father at bedside  Disposition Plan  : Home once better, transferred to telemetry.  Barriers For Discharge : Active symptoms  Consults  :  None  Procedures  : CT abdomen  DVT Prophylaxis  :  Lovenox -   Lab Results  Component Value Date   PLT 196 04/24/2017    Antibiotics  :    Anti-infectives    Start     Dose/Rate Route Frequency Ordered Stop   04/24/17 1000  vancomycin (VANCOCIN) 1,250 mg in sodium chloride 0.9 % 250 mL IVPB     1,250 mg 166.7 mL/hr over 90 Minutes Intravenous Every 12 hours 04/23/17 2003     04/23/17 2200  vancomycin (VANCOCIN) 2,000 mg in sodium chloride 0.9 % 500 mL IVPB     2,000 mg 250 mL/hr over 120 Minutes Intravenous  Once 04/23/17 2003 04/24/17 0037   04/23/17 0600   piperacillin-tazobactam (ZOSYN) IVPB 3.375 g     3.375 g 12.5 mL/hr over 240 Minutes Intravenous Every 8 hours 04/22/17 2320     04/22/17 2115  piperacillin-tazobactam (ZOSYN) IVPB 3.375 g     3.375 g 100 mL/hr over 30 Minutes Intravenous  Once 04/22/17 2106 04/22/17 2223        Objective:   Vitals:   04/24/17 0726 04/24/17 0800 04/24/17 0833 04/24/17 1000  BP:  (!) 118/46  127/68  Pulse:  (!) 106  (!) 105  Resp:  12  18  Temp: 98.9 F (37.2 C)     TempSrc: Axillary     SpO2:  93% 97% 91%  Weight:      Height:        Wt Readings from Last 3 Encounters:  04/23/17 124.7 kg (274 lb 14.6 oz)  04/22/17 122.9 kg (271 lb)  01/17/17 124.3 kg (274 lb)     Intake/Output Summary (Last 24 hours) at 04/24/17 1031 Last data filed at 04/24/17 1000  Gross per 24 hour  Intake          3067.92 ml  Output             1600 ml  Net          1467.92 ml     Physical Exam Gen.: Morbidly obese female not in distress   HEENT: Moist mucosa, supple neck Chest: Clear to auscultation CVS: S1 and S2 tachycardic, no murmurs rubs or gallop  GI: Soft, nondistended, bowel sounds present, epigastric tenderness (improved from yesterday) Musculoskeletal: Warm, no edema      Data Review:    CBC  Recent Labs Lab 04/22/17 1521 04/22/17 1913 04/23/17 0137 04/23/17 1542 04/24/17 0336  WBC 41.9* 40.1* 36.2* 27.3* 21.7*  HGB 15.2 14.5 12.9 12.2 11.2*  HCT 44.6 39.9 36.1 34.3* 32.9*  PLT  --  220 215 205 196  MCV 86.7 83.5 83.2 84.1 85.0  MCH 29.6 30.3 29.7 29.9 28.9  MCHC 34.1 36.3*  35.7 35.6 34.0  RDW  --  12.9 12.8 13.0 13.0  LYMPHSABS  --  2.0  --   --   --   MONOABS  --  4.0*  --   --   --   EOSABS  --  0.0  --   --   --   BASOSABS  --  0.0  --   --   --     Chemistries   Recent Labs Lab 04/22/17 1505 04/22/17 1913 04/23/17 0137 04/23/17 0206 04/23/17 1542 04/24/17 0336  NA 121* 123* 122*  --  127* 129*  K 2.8* 2.5* 2.4*  --  3.1* 3.5  CL 79* 78* 81*  --  86* 91*  CO2  31* 31 29  --  30 29  GLUCOSE 146* 133* 130*  --  124* 129*  BUN 14 19 18   --  13 9  CREATININE 1.15* 1.43* 0.96  --  0.78 0.64  CALCIUM 9.6 8.5* 7.9*  --  8.0* 7.9*  MG  --   --   --  1.6*  --   --   AST 27 28 28   --   --  21  ALT 21 22 23   --   --  21  ALKPHOS 146* 115 108  --   --  96  BILITOT 1.6* 1.6* 1.8*  --   --  1.0   ------------------------------------------------------------------------------------------------------------------ No results for input(s): CHOL, HDL, LDLCALC, TRIG, CHOLHDL, LDLDIRECT in the last 72 hours.  Lab Results  Component Value Date   HGBA1C 5.6 10/18/2016   ------------------------------------------------------------------------------------------------------------------ No results for input(s): TSH, T4TOTAL, T3FREE, THYROIDAB in the last 72 hours.  Invalid input(s): FREET3 ------------------------------------------------------------------------------------------------------------------ No results for input(s): VITAMINB12, FOLATE, FERRITIN, TIBC, IRON, RETICCTPCT in the last 72 hours.  Coagulation profile No results for input(s): INR, PROTIME in the last 168 hours.  No results for input(s): DDIMER in the last 72 hours.  Cardiac Enzymes No results for input(s): CKMB, TROPONINI, MYOGLOBIN in the last 168 hours.  Invalid input(s): CK ------------------------------------------------------------------------------------------------------------------ No results found for: BNP  Inpatient Medications  Scheduled Meds: . carisoprodol  350 mg Oral QID  . citalopram  20 mg Oral Daily  . dicyclomine  20 mg Oral TID AC  . docusate sodium  100 mg Oral BID  . enoxaparin (LOVENOX) injection  60 mg Subcutaneous QHS  . ketotifen  2 drop Both Eyes BID  . levothyroxine  75 mcg Oral QAC breakfast  . mometasone-formoterol  2 puff Inhalation BID  . montelukast  10 mg Oral q morning - 10a  . pantoprazole  40 mg Oral Daily  . pregabalin  150 mg Oral BID  .  sodium chloride flush  3 mL Intravenous Q12H   Continuous Infusions: . 0.9 % NaCl with KCl 40 mEq / L 125 mL/hr at 04/24/17 1000  . piperacillin-tazobactam (ZOSYN)  IV Stopped (04/24/17 1029)  . vancomycin Stopped (04/24/17 1037)   PRN Meds:.albuterol, hydrALAZINE, morphine injection, ondansetron **OR** ondansetron (ZOFRAN) IV, polyethylene glycol, polyvinyl alcohol, traMADol  Micro Results Recent Results (from the past 240 hour(s))  Culture, blood (Routine X 2) w Reflex to ID Panel     Status: None (Preliminary result)   Collection Time: 04/22/17 10:48 PM  Result Value Ref Range Status   Specimen Description BLOOD LEFT ANTECUBITAL  Final   Special Requests   Final    BOTTLES DRAWN AEROBIC AND ANAEROBIC Blood Culture results may not be optimal due to an excessive volume of blood  received in culture bottles Performed at [facility], [address] [place], [place] [address]    Culture PENDING  Incomplete   Report Status PENDING  Incomplete  Culture, blood (Routine X 2) w Reflex to ID Panel     Status: None (Preliminary result)   Collection Time: 04/22/17 10:48 PM  Result Value Ref Range Status   Specimen Description BLOOD BLOOD RIGHT HAND  Final   Special Requests   Final    IN PEDIATRIC BOTTLE Blood Culture adequate volume Performed at [facility], [address] [place], [place] [address]    Culture  Setup Time   Final    GRAM POSITIVE RODS IN PEDIATRIC BOTTLE CRITICAL RESULT CALLED TO, READ BACK BY AND VERIFIED WITH: Haze Boyden, PHARMD 1938 04/23/2017 T. TYSOR    Culture GRAM POSITIVE RODS  Final   Report Status PENDING  Incomplete  Blood Culture ID Panel (Reflexed)     Status: None   Collection Time: 04/22/17 10:48 PM  Result Value Ref Range Status   Enterococcus species NOT DETECTED NOT DETECTED Final   Listeria monocytogenes NOT DETECTED NOT DETECTED Final   Staphylococcus species NOT DETECTED NOT DETECTED Final   Staphylococcus aureus NOT DETECTED NOT  DETECTED Final   Streptococcus species NOT DETECTED NOT DETECTED Final   Streptococcus agalactiae NOT DETECTED NOT DETECTED Final   Streptococcus pneumoniae NOT DETECTED NOT DETECTED Final   Streptococcus pyogenes NOT DETECTED NOT DETECTED Final   Acinetobacter baumannii NOT DETECTED NOT DETECTED Final   Enterobacteriaceae species NOT DETECTED NOT DETECTED Final   Enterobacter cloacae complex NOT DETECTED NOT DETECTED Final   Escherichia coli NOT DETECTED NOT DETECTED Final   Klebsiella oxytoca NOT DETECTED NOT DETECTED Final   Klebsiella pneumoniae NOT DETECTED NOT DETECTED Final   Proteus species NOT DETECTED NOT DETECTED Final   Serratia marcescens NOT DETECTED NOT DETECTED Final   Haemophilus influenzae NOT DETECTED NOT DETECTED Final   Neisseria meningitidis NOT DETECTED NOT DETECTED Final   Pseudomonas aeruginosa NOT DETECTED NOT DETECTED Final   Candida albicans NOT DETECTED NOT DETECTED Final   Candida glabrata NOT DETECTED NOT DETECTED Final   Candida krusei NOT DETECTED NOT DETECTED Final   Candida parapsilosis NOT DETECTED NOT DETECTED Final   Candida tropicalis NOT DETECTED NOT DETECTED Final  MRSA PCR Screening     Status: None   Collection Time: 04/22/17 10:50 PM  Result Value Ref Range Status   MRSA by PCR NEGATIVE NEGATIVE Final    Comment:        The GeneXpert MRSA Assay (FDA approved for NASAL specimens only), is one component of a comprehensive MRSA colonization surveillance program. It is not intended to diagnose MRSA infection nor to guide or monitor treatment for MRSA infections.     Radiology Reports Ct Abdomen Pelvis W Contrast  Result Date: 04/22/2017 CLINICAL DATA:  Epigastric abdominal pain.  Leukocytosis. EXAM: CT ABDOMEN AND PELVIS WITH CONTRAST TECHNIQUE: Multidetector CT imaging of the abdomen and pelvis was performed using the standard protocol following bolus administration of intravenous contrast. CONTRAST:  ISOVUE-300 IOPAMIDOL  (ISOVUE-300) INJECTION 61% COMPARISON:  Abdominal radiographs from earlier today. FINDINGS: Lower chest: No significant pulmonary nodules or acute consolidative airspace disease. Hepatobiliary: Diffuse hepatic steatosis. No definite liver surface irregularity. No liver mass. Normal gallbladder with no radiopaque cholelithiasis. No biliary ductal dilatation. Pancreas: Diffuse pancreatic parenchymal thickening with prominent diffuse peripancreatic fat stranding and ill-defined fluid, compatible with acute pancreatitis. No definite regions of  pancreatic parenchymal nonenhancement or pancreatic parenchymal gas to suggest necrotizing pancreatitis. No measurable peripancreatic fluid collections. No pancreatic mass or duct dilation. Spleen: Normal size spleen. Punctate scattered granulomatous splenic calcifications. No splenic mass. Adrenals/Urinary Tract: Normal adrenals. Hydronephrosis. Simple 1.9 cm lateral interpolar right renal cyst. Normal bladder. Stomach/Bowel: Grossly normal stomach. Normal caliber small bowel with no small bowel wall thickening. Normal appendix. Minimal sigmoid diverticulosis, with no large bowel wall thickening or pericolonic fat stranding. Vascular/Lymphatic: Atherosclerotic nonaneurysmal abdominal aorta. Patent portal, splenic, hepatic and renal veins. No pathologically enlarged lymph nodes in the abdomen or pelvis. Reproductive: Grossly normal uterus.  No adnexal mass. Other: No pneumoperitoneum, ascites or focal fluid collection. Small fat containing supraumbilical midline ventral abdominal hernia. Musculoskeletal: No aggressive appearing focal osseous lesions. Mild thoracolumbar spondylosis. IMPRESSION: 1. Prominent acute pancreatitis. No evidence of necrotizing pancreatitis or measurable peripancreatic fluid collections. 2. No radiopaque cholelithiasis.  No biliary ductal dilatation. 3. Diffuse hepatic steatosis. 4.  Aortic Atherosclerosis (ICD10-I70.0). 5. Small fat containing  supraumbilical midline ventral abdominal hernia . Electronically Signed   By: Delbert Phenix M.D.   On: 04/22/2017 20:10   Dg Abd Acute W/chest  Result Date: 04/22/2017 CLINICAL DATA:  Abdominal pain and nausea; fever EXAM: DG ABDOMEN ACUTE W/ 1V CHEST COMPARISON:  CT abdomen and pelvis February 03, 2016. Chest radiograph August 31, 2013 FINDINGS: PA chest: There is no edema or consolidation. The heart size and pulmonary vascularity are normal. No adenopathy. There is aortic atherosclerosis. Supine and upright abdomen: There is no appreciable bowel dilatation. There are a few scattered air-fluid levels. There is moderate stool in the colon. There are small pelvic phleboliths. IMPRESSION: Scattered air-fluid levels. Question early ileus or enteritis. There is no appreciable bowel obstruction. No free air. No edema or consolidation.  There is aortic atherosclerosis. Aortic Atherosclerosis (ICD10-I70.0). Electronically Signed   By: Bretta Bang III M.D.   On: 04/22/2017 15:13    Time Spent in minutes  35   Eddie North M.D on 04/24/2017 at 10:31 AM  Between 7am to 7pm - Pager - [phone]  After 7pm go to [email or web address] - password [facility]  [facility] -  Office  [phone]

## 2017-04-25 ENCOUNTER — Ambulatory Visit (HOSPITAL_COMMUNITY): Admission: RE | Admit: 2017-04-25 | Payer: 59 | Source: Ambulatory Visit

## 2017-04-25 LAB — CBC
HEMATOCRIT: 30.7 % — AB (ref 36.0–46.0)
Hemoglobin: 10.3 g/dL — ABNORMAL LOW (ref 12.0–15.0)
MCH: 28.9 pg (ref 26.0–34.0)
MCHC: 33.6 g/dL (ref 30.0–36.0)
MCV: 86.2 fL (ref 78.0–100.0)
Platelets: 186 10*3/uL (ref 150–400)
RBC: 3.56 MIL/uL — ABNORMAL LOW (ref 3.87–5.11)
RDW: 13.4 % (ref 11.5–15.5)
WBC: 17.1 10*3/uL — ABNORMAL HIGH (ref 4.0–10.5)

## 2017-04-25 LAB — BASIC METABOLIC PANEL
ANION GAP: 5 (ref 5–15)
BUN: 7 mg/dL (ref 6–20)
CHLORIDE: 99 mmol/L — AB (ref 101–111)
CO2: 29 mmol/L (ref 22–32)
Calcium: 7.9 mg/dL — ABNORMAL LOW (ref 8.9–10.3)
Creatinine, Ser: 0.7 mg/dL (ref 0.44–1.00)
GFR calc Af Amer: >60 mL/min (ref >60)
GFR calc non Af Amer: >60 mL/min (ref >60)
GLUCOSE: 110 mg/dL — AB (ref 65–99)
POTASSIUM: 4.5 mmol/L (ref 3.5–5.1)
Sodium: 133 mmol/L — ABNORMAL LOW (ref 135–145)

## 2017-04-25 LAB — CULTURE, BLOOD (ROUTINE X 2): SPECIAL REQUESTS: ADEQUATE

## 2017-04-25 NOTE — Progress Notes (Signed)
Taking over care of patient agree with previous RN assessment. Denies any needs at this time. Will continue to monitor.  

## 2017-04-25 NOTE — Progress Notes (Signed)
Pharmacy Antibiotic Note  Gloria Carter is a 51 y.o. female admitted on 7/13 with severe abdominal pain, CT shows acute pancreatitis.  Subsequent blood cultures grew gram positive rods.  Pharmacy has been consulted for Zosyn and Vancomycin dosing.  Today, 04/25/2017: WBC remain elevated, 17.1, but improving Afebrile SCr 0.7, stable with CrCl > 100 ml/min  Plan:  Continue Zosyn 3.375g IV Q8H infused over 4hrs.   Continue Vancomycin 1250 mg IV q12h.  Daily SCr d/t increased risk for nephrotoxicity while on Vanc/Zosyn combination therapy  Measure Vanc trough at steady state.  Follow up renal fxn, culture results, and clinical course.   Height: 5\' 2"  (157.5 cm) Weight: 279 lb 12.2 oz (126.9 kg) IBW/kg (Calculated) : 50.1  Temp (24hrs), Avg:98.5 F (36.9 C), Min:98.3 F (36.8 C), Max:98.7 F (37.1 C)   Recent Labs Lab 04/22/17 1808 04/22/17 1913 04/23/17 0137 04/23/17 1542 04/24/17 0336 04/25/17 0445  WBC  --  40.1* 36.2* 27.3* 21.7* 17.1*  CREATININE  --  1.43* 0.96 0.78 0.64 0.70  LATICACIDVEN <0.30*  --   --   --   --   --     Estimated Creatinine Clearance: 103.7 mL/min (by C-G formula based on SCr of 0.7 mg/dL).    Allergies  Allergen Reactions  . Soap     Any soaps with fragrance cause migraines  . Sulfa Antibiotics Nausea And Vomiting    Antimicrobials this admission: 7/13 zosyn >>  7/14 Vanc >>   Dose adjustments this admission: 7/17 0900 VT = _____  Microbiology results: 7/13 BCx: 1/2 Bacillus sp, 1/2 NGTD (Blood Culture results may not be optimal due to an excessive volume of blood received in culture bottles) 7/14  BCID did not identify 7/13 MRSA PCR: neg 7/16 BCx:  sent  Thank you for allowing pharmacy to be a part of this patient's care.  Lynann Beaverhristine Mckenzie Bove PharmD, BCPS Pager [phone] 04/25/2017 2:07 PM

## 2017-04-25 NOTE — Progress Notes (Signed)
PROGRESS NOTE                                                                                                                                                                                                             Patient Demographics:    Gloria Carter, is a 51 y.o. female, DOB - 27-Mar-1966, WUJ:[id number]  Admit date - 04/22/2017   Admitting Physician Alysia PennaScott Holwerda, MD  Outpatient Primary MD for the patient is Sherren MochaShaw, Eva N, MD  LOS - 3  Outpatient Specialists: none  Chief Complaint  Patient presents with  . Pancreatitis  . Abnormal Lab    elevated WBC       Brief Narrative   51 year old obese female with history of pancreatitis of unclear etiology, ADD, fibromyalgia on multiple medications, hypertension , hyperlipidemia, hypothyroidism and GERD who presented with 3 day history of severe epigastric pain radiating to the back for the past 3 days and progressively worsened. She has not been able to eat or drink because of that. Reports that symptoms were similar to her prior pancreatitis. No relieving factors. She also had an episode of nonbilious vomiting on the day of admission. Denies any fever, chills, headache, dizziness, shortness of breath, chest pain, dysuria or diarrhea. She went to her PCP where labs were done showing wbc of 41,000. She was then sent to the ED. She was tachycardic and tachypneic in the ED. Blood work repeated showed WBC of 40,000, chemistry showed sodium of 123, potassium of 2.5, chloride of 78, acute kidney injury with creatinine of 1.43, normal lactic acid. LFTs were normal. Lipase was only 57.. CT of the abdomen showed acute enteritis without necrotizing, abscess or fluid collection. No biliary duct dilatation or gallstones. Patient admitted to stepdown unit for SIRS due to acute pancreatitis    Subjective:   Patient feels her abdominal pain to be minimally improved from yesterday. No  nausea or vomiting. No further diarrhea.  Assessment  & Plan :   Principal problem Sepsis with acute on chronic pancreatitis. Blood culture 1/2 growing bacillus. Denies any new medications or recent illness. CT without pancreatic abscess, necrosis or fluid. No gallstones or biliary dilatation. She had a normal HIDA scan in 2017 when she had acute pancreatitis. Sepsis resolving. Continue IV morphine when necessary for pain, supportive care with antiemetics and serial abdominal exam. DC  fluids. Empiric antibiotics.  Advance diet to soft.    Sepsis with gram-positive bacteremia No clear source. 1/2 culture from admission growing bacillus. Discussed with Dr. Ninetta Lights who recommended repeating blood culture and following up as source is unclear and not sure if this is a true infection. Continue empiric vancomycin and Zosyn for now..  Severe hyponatremia Secondary to severe dehydration and HCTZ use. Improving with IV fluids. HCTZ discontinued.  hypokalemia and hypomagnesemia Secondary to GI loss. Replenished and improved  Chronic fibromyalgia Home medications  Depression Continue Celexa.  Hypothyroidism Continue Synthroid  Morbid obesity (BMI of 50) Needs counseling on diet and excised to loose weight.    Code Status : Full code  Family Communication  : Father at bedside  Disposition Plan  : Home possibly in the next 24-48 hours if symptoms improved, tolerating advanced diet and final cultures and sensitivity obtained.  Barriers For Discharge : Active symptoms  Consults  :  None  Procedures  : CT abdomen  DVT Prophylaxis  :  Lovenox -   Lab Results  Component Value Date   PLT 186 04/25/2017    Antibiotics  :    Anti-infectives    Start     Dose/Rate Route Frequency Ordered Stop   04/24/17 1000  vancomycin (VANCOCIN) 1,250 mg in sodium chloride 0.9 % 250 mL IVPB     1,250 mg 166.7 mL/hr over 90 Minutes Intravenous Every 12 hours 04/23/17 2003     04/23/17 2200   vancomycin (VANCOCIN) 2,000 mg in sodium chloride 0.9 % 500 mL IVPB     2,000 mg 250 mL/hr over 120 Minutes Intravenous  Once 04/23/17 2003 04/24/17 0037   04/23/17 0600  piperacillin-tazobactam (ZOSYN) IVPB 3.375 g     3.375 g 12.5 mL/hr over 240 Minutes Intravenous Every 8 hours 04/22/17 2320     04/22/17 2115  piperacillin-tazobactam (ZOSYN) IVPB 3.375 g     3.375 g 100 mL/hr over 30 Minutes Intravenous  Once 04/22/17 2106 04/22/17 2223        Objective:   Vitals:   04/24/17 1115 04/24/17 2117 04/25/17 0501 04/25/17 0903  BP: 128/63 (!) 127/56 110/69   Pulse: (!) 112 (!) 104 (!) 102   Resp: 18 18 18    Temp: 98.7 F (37.1 C) 98.6 F (37 C) 98.3 F (36.8 C)   TempSrc: Oral Oral Oral   SpO2: 95% 97% 95% 97%  Weight:   126.9 kg (279 lb 12.2 oz)   Height:        Wt Readings from Last 3 Encounters:  04/25/17 126.9 kg (279 lb 12.2 oz)  04/22/17 122.9 kg (271 lb)  01/17/17 124.3 kg (274 lb)     Intake/Output Summary (Last 24 hours) at 04/25/17 1157 Last data filed at 04/25/17 0600  Gross per 24 hour  Intake             2900 ml  Output              800 ml  Net             2100 ml     Physical Exam Gen.: Moderately obese female not in distress HEENT: Moist mucosa, supple neck Chest: Clear bilaterally CVS: Normal S1 and S2, no murmurs GI: Soft, bowel sounds present, nondistended, epigastric tenderness (unchanged from yesterday) Musculoskeletal: Warm, no edema      Data Review:    CBC  Recent Labs Lab 04/22/17 1913 04/23/17 0137 04/23/17 1542 04/24/17 0336 04/25/17 0445  WBC 40.1* 36.2* 27.3* 21.7* 17.1*  HGB 14.5 12.9 12.2 11.2* 10.3*  HCT 39.9 36.1 34.3* 32.9* 30.7*  PLT 220 215 205 196 186  MCV 83.5 83.2 84.1 85.0 86.2  MCH 30.3 29.7 29.9 28.9 28.9  MCHC 36.3* 35.7 35.6 34.0 33.6  RDW 12.9 12.8 13.0 13.0 13.4  LYMPHSABS 2.0  --   --   --   --   MONOABS 4.0*  --   --   --   --   EOSABS 0.0  --   --   --   --   BASOSABS 0.0  --   --   --   --      Chemistries   Recent Labs Lab 04/22/17 1505 04/22/17 1913 04/23/17 0137 04/23/17 0206 04/23/17 1542 04/24/17 0336 04/25/17 0445  NA 121* 123* 122*  --  127* 129* 133*  K 2.8* 2.5* 2.4*  --  3.1* 3.5 4.5  CL 79* 78* 81*  --  86* 91* 99*  CO2 31* 31 29  --  30 29 29   GLUCOSE 146* 133* 130*  --  124* 129* 110*  BUN 14 19 18   --  13 9 7   CREATININE 1.15* 1.43* 0.96  --  0.78 0.64 0.70  CALCIUM 9.6 8.5* 7.9*  --  8.0* 7.9* 7.9*  MG  --   --   --  1.6*  --   --   --   AST 27 28 28   --   --  21  --   ALT 21 22 23   --   --  21  --   ALKPHOS 146* 115 108  --   --  96  --   BILITOT 1.6* 1.6* 1.8*  --   --  1.0  --    ------------------------------------------------------------------------------------------------------------------ No results for input(s): CHOL, HDL, LDLCALC, TRIG, CHOLHDL, LDLDIRECT in the last 72 hours.  Lab Results  Component Value Date   HGBA1C 5.6 10/18/2016   ------------------------------------------------------------------------------------------------------------------ No results for input(s): TSH, T4TOTAL, T3FREE, THYROIDAB in the last 72 hours.  Invalid input(s): FREET3 ------------------------------------------------------------------------------------------------------------------ No results for input(s): VITAMINB12, FOLATE, FERRITIN, TIBC, IRON, RETICCTPCT in the last 72 hours.  Coagulation profile No results for input(s): INR, PROTIME in the last 168 hours.  No results for input(s): DDIMER in the last 72 hours.  Cardiac Enzymes No results for input(s): CKMB, TROPONINI, MYOGLOBIN in the last 168 hours.  Invalid input(s): CK ------------------------------------------------------------------------------------------------------------------ No results found for: BNP  Inpatient Medications  Scheduled Meds: . carisoprodol  350 mg Oral QID  . citalopram  20 mg Oral Daily  . dicyclomine  20 mg Oral TID AC  . docusate sodium  100 mg Oral BID   . enoxaparin (LOVENOX) injection  60 mg Subcutaneous QHS  . ketotifen  2 drop Both Eyes BID  . levothyroxine  75 mcg Oral QAC breakfast  . mometasone-formoterol  2 puff Inhalation BID  . montelukast  10 mg Oral q morning - 10a  . pantoprazole  40 mg Oral Daily  . pregabalin  150 mg Oral BID  . sodium chloride flush  3 mL Intravenous Q12H   Continuous Infusions: . piperacillin-tazobactam (ZOSYN)  IV Stopped (04/25/17 0942)  . vancomycin 1,250 mg (04/25/17 1022)   PRN Meds:.albuterol, hydrALAZINE, morphine injection, ondansetron **OR** ondansetron (ZOFRAN) IV, polyethylene glycol, polyvinyl alcohol, traMADol  Micro Results Recent Results (from the past 240 hour(s))  Culture, blood (Routine X 2) w Reflex to ID Panel     Status: None (  Preliminary result)   Collection Time: 04/22/17 10:48 PM  Result Value Ref Range Status   Specimen Description BLOOD LEFT ANTECUBITAL  Final   Special Requests   Final    BOTTLES DRAWN AEROBIC AND ANAEROBIC Blood Culture results may not be optimal due to an excessive volume of blood received in culture bottles   Culture   Final    NO GROWTH 1 DAY Performed at [facility], [address], [place], [place] [address]    Report Status PENDING  Incomplete  Culture, blood (Routine X 2) w Reflex to ID Panel     Status: Abnormal   Collection Time: 04/22/17 10:48 PM  Result Value Ref Range Status   Specimen Description BLOOD BLOOD RIGHT HAND  Final   Special Requests IN PEDIATRIC BOTTLE Blood Culture adequate volume  Final   Culture  Setup Time   Final    GRAM POSITIVE RODS IN PEDIATRIC BOTTLE CRITICAL RESULT CALLED TO, READ BACK BY AND VERIFIED WITH: Haze Boyden, PHARMD 1938 04/23/2017 T. TYSOR    Culture (A)  Final    BACILLUS SPECIES Standardized susceptibility testing for this organism is not available. Performed at [facility], [address], [place], [place] [address]    Report Status 04/25/2017 FINAL  Final  Blood Culture ID Panel  (Reflexed)     Status: None   Collection Time: 04/22/17 10:48 PM  Result Value Ref Range Status   Enterococcus species NOT DETECTED NOT DETECTED Final   Listeria monocytogenes NOT DETECTED NOT DETECTED Final   Staphylococcus species NOT DETECTED NOT DETECTED Final   Staphylococcus aureus NOT DETECTED NOT DETECTED Final   Streptococcus species NOT DETECTED NOT DETECTED Final   Streptococcus agalactiae NOT DETECTED NOT DETECTED Final   Streptococcus pneumoniae NOT DETECTED NOT DETECTED Final   Streptococcus pyogenes NOT DETECTED NOT DETECTED Final   Acinetobacter baumannii NOT DETECTED NOT DETECTED Final   Enterobacteriaceae species NOT DETECTED NOT DETECTED Final   Enterobacter cloacae complex NOT DETECTED NOT DETECTED Final   Escherichia coli NOT DETECTED NOT DETECTED Final   Klebsiella oxytoca NOT DETECTED NOT DETECTED Final   Klebsiella pneumoniae NOT DETECTED NOT DETECTED Final   Proteus species NOT DETECTED NOT DETECTED Final   Serratia marcescens NOT DETECTED NOT DETECTED Final   Haemophilus influenzae NOT DETECTED NOT DETECTED Final   Neisseria meningitidis NOT DETECTED NOT DETECTED Final   Pseudomonas aeruginosa NOT DETECTED NOT DETECTED Final   Candida albicans NOT DETECTED NOT DETECTED Final   Candida glabrata NOT DETECTED NOT DETECTED Final   Candida krusei NOT DETECTED NOT DETECTED Final   Candida parapsilosis NOT DETECTED NOT DETECTED Final   Candida tropicalis NOT DETECTED NOT DETECTED Final  MRSA PCR Screening     Status: None   Collection Time: 04/22/17 10:50 PM  Result Value Ref Range Status   MRSA by PCR NEGATIVE NEGATIVE Final    Comment:        The GeneXpert MRSA Assay (FDA approved for NASAL specimens only), is one component of a comprehensive MRSA colonization surveillance program. It is not intended to diagnose MRSA infection nor to guide or monitor treatment for MRSA infections.     Radiology Reports Ct Abdomen Pelvis W Contrast  Result Date:  04/22/2017 CLINICAL DATA:  Epigastric abdominal pain.  Leukocytosis. EXAM: CT ABDOMEN AND PELVIS WITH CONTRAST TECHNIQUE: Multidetector CT imaging of the abdomen and pelvis was performed using the standard protocol following bolus administration of intravenous contrast. CONTRAST:  ISOVUE-300  IOPAMIDOL (ISOVUE-300) INJECTION 61% COMPARISON:  Abdominal radiographs from earlier today. FINDINGS: Lower chest: No significant pulmonary nodules or acute consolidative airspace disease. Hepatobiliary: Diffuse hepatic steatosis. No definite liver surface irregularity. No liver mass. Normal gallbladder with no radiopaque cholelithiasis. No biliary ductal dilatation. Pancreas: Diffuse pancreatic parenchymal thickening with prominent diffuse peripancreatic fat stranding and ill-defined fluid, compatible with acute pancreatitis. No definite regions of pancreatic parenchymal nonenhancement or pancreatic parenchymal gas to suggest necrotizing pancreatitis. No measurable peripancreatic fluid collections. No pancreatic mass or duct dilation. Spleen: Normal size spleen. Punctate scattered granulomatous splenic calcifications. No splenic mass. Adrenals/Urinary Tract: Normal adrenals. Hydronephrosis. Simple 1.9 cm lateral interpolar right renal cyst. Normal bladder. Stomach/Bowel: Grossly normal stomach. Normal caliber small bowel with no small bowel wall thickening. Normal appendix. Minimal sigmoid diverticulosis, with no large bowel wall thickening or pericolonic fat stranding. Vascular/Lymphatic: Atherosclerotic nonaneurysmal abdominal aorta. Patent portal, splenic, hepatic and renal veins. No pathologically enlarged lymph nodes in the abdomen or pelvis. Reproductive: Grossly normal uterus.  No adnexal mass. Other: No pneumoperitoneum, ascites or focal fluid collection. Small fat containing supraumbilical midline ventral abdominal hernia. Musculoskeletal: No aggressive appearing focal osseous lesions. Mild thoracolumbar  spondylosis. IMPRESSION: 1. Prominent acute pancreatitis. No evidence of necrotizing pancreatitis or measurable peripancreatic fluid collections. 2. No radiopaque cholelithiasis.  No biliary ductal dilatation. 3. Diffuse hepatic steatosis. 4.  Aortic Atherosclerosis (ICD10-I70.0). 5. Small fat containing supraumbilical midline ventral abdominal hernia . Electronically Signed   By: Delbert Phenix M.D.   On: 04/22/2017 20:10   Dg Abd Acute W/chest  Result Date: 04/22/2017 CLINICAL DATA:  Abdominal pain and nausea; fever EXAM: DG ABDOMEN ACUTE W/ 1V CHEST COMPARISON:  CT abdomen and pelvis February 03, 2016. Chest radiograph August 31, 2013 FINDINGS: PA chest: There is no edema or consolidation. The heart size and pulmonary vascularity are normal. No adenopathy. There is aortic atherosclerosis. Supine and upright abdomen: There is no appreciable bowel dilatation. There are a few scattered air-fluid levels. There is moderate stool in the colon. There are small pelvic phleboliths. IMPRESSION: Scattered air-fluid levels. Question early ileus or enteritis. There is no appreciable bowel obstruction. No free air. No edema or consolidation.  There is aortic atherosclerosis. Aortic Atherosclerosis (ICD10-I70.0). Electronically Signed   By: Bretta Bang III M.D.   On: 04/22/2017 15:13    Time Spent in minutes  25   Eddie North M.D on 04/25/2017 at 11:57 AM  Between 7am to 7pm - Pager - [phone]  After 7pm go to [email or web address] - password [facility]  [facility] -  Office  [phone]

## 2017-04-25 NOTE — Care Management Note (Signed)
Case Management Note  Patient Details  Name: Gloria GrovesBarbara Rapp-Austin MRN: [id number] Date of Birth: Dec 30, 1965  Subjective/Objective:  51 y/o f admitted w/Pacreatitis. From home.                  Action/Plan:d/c home.   Expected Discharge Date:                  Expected Discharge Plan:  Home/Self Care  In-House Referral:     Discharge planning Services  CM Consult  Post Acute Care Choice:    Choice offered to:     DME Arranged:    DME Agency:     HH Arranged:    HH Agency:     Status of Service:  In process, will continue to follow  If discussed at Long Length of Stay Meetings, dates discussed:    Additional Comments:  Lanier ClamMahabir, Yovanny Coats, RN 04/25/2017, 2:30 PM

## 2017-04-26 DIAGNOSIS — M797 Fibromyalgia: Secondary | ICD-10-CM

## 2017-04-26 DIAGNOSIS — A419 Sepsis, unspecified organism: Secondary | ICD-10-CM | POA: Diagnosis present

## 2017-04-26 DIAGNOSIS — K859 Acute pancreatitis without necrosis or infection, unspecified: Secondary | ICD-10-CM | POA: Diagnosis present

## 2017-04-26 DIAGNOSIS — E871 Hypo-osmolality and hyponatremia: Secondary | ICD-10-CM | POA: Diagnosis present

## 2017-04-26 DIAGNOSIS — Z6841 Body Mass Index (BMI) 40.0 and over, adult: Secondary | ICD-10-CM

## 2017-04-26 DIAGNOSIS — E876 Hypokalemia: Secondary | ICD-10-CM | POA: Diagnosis present

## 2017-04-26 DIAGNOSIS — G8929 Other chronic pain: Secondary | ICD-10-CM

## 2017-04-26 DIAGNOSIS — R6520 Severe sepsis without septic shock: Secondary | ICD-10-CM

## 2017-04-26 DIAGNOSIS — I10 Essential (primary) hypertension: Secondary | ICD-10-CM

## 2017-04-26 LAB — CBC
HCT: 32.8 % — ABNORMAL LOW (ref 36.0–46.0)
Hemoglobin: 10.9 g/dL — ABNORMAL LOW (ref 12.0–15.0)
MCH: 29 pg (ref 26.0–34.0)
MCHC: 33.2 g/dL (ref 30.0–36.0)
MCV: 87.2 fL (ref 78.0–100.0)
PLATELETS: 228 10*3/uL (ref 150–400)
RBC: 3.76 MIL/uL — AB (ref 3.87–5.11)
RDW: 13.6 % (ref 11.5–15.5)
WBC: 16.9 10*3/uL — AB (ref 4.0–10.5)

## 2017-04-26 LAB — CREATININE, SERUM
CREATININE: 0.7 mg/dL (ref 0.44–1.00)
GFR calc Af Amer: >60 mL/min (ref >60)

## 2017-04-26 LAB — VANCOMYCIN, TROUGH: VANCOMYCIN TR: 15 ug/mL (ref 15–20)

## 2017-04-26 MED ORDER — AMOXICILLIN-POT CLAVULANATE 875-125 MG PO TABS: 1.0000 | ORAL_TABLET | Freq: Two times a day (BID) | ORAL | 0 refills | Status: AC

## 2017-04-26 MED ORDER — TELMISARTAN 80 MG PO TABS: 80.0000 mg | ORAL_TABLET | Freq: Every day | ORAL | 0 refills | Status: DC

## 2017-04-26 MED ORDER — PANCRELIPASE (LIP-PROT-AMYL) 12000-38000 UNITS PO CPEP: 24000.0000 [IU] | ORAL_CAPSULE | Freq: Three times a day (TID) | ORAL | 0 refills | Status: DC

## 2017-04-26 NOTE — Discharge Summary (Signed)
Physician Discharge Summary  Gloria Carter RUE:[id number] DOB: May 09, 1966 DOA: 04/22/2017  PCP: Sherren Mocha, MD  Admit date: 04/22/2017 Discharge date: 04/26/2017  Admitted From: home Disposition:  home  Recommendations for Outpatient Follow-up:  1. Follow up with PCP in 1-2 weeks. Follow WBC during outpatient visit. 2. Patient will complete antibiotic course on 7/24.   Home Health:none Equipment/Devices:none  Discharge Condition:fair CODE STATUS: full code Diet recommendation: regular    Discharge Diagnoses:  Principal Problems:   Severe sepsis (HCC)  Active problem  Acute on chronic enteritis without infection or necrosis   Fibromyalgia   Chronic pain   HTN (hypertension)   BMI 45.0-49.9, adult (HCC)   IBS (irritable bowel syndrome)   Degeneration of lumbar or lumbosacral intervertebral disc   Hypokalemia   Hyponatremia   Positive blood culture  Brief narrative/history of present illness 51 year old obese female with history of pancreatitis of unclear etiology, ADD, fibromyalgia on multiple medications, hypertension , hyperlipidemia, hypothyroidism and GERD who presented with 3 day history of severe epigastric pain radiating to the back for the past 3 days and progressively worsened. She has not been able to eat or drink because of that. Reports that symptoms were similar to her prior pancreatitis. No relieving factors. She also had an episode of nonbilious vomiting on the day of admission. Denies any fever, chills, headache, dizziness, shortness of breath, chest pain, dysuria or diarrhea. She went to her PCP where labs were done showing wbc of 41,000. She was then sent to the ED. She was tachycardic and tachypneic in the ED. Blood work repeated showed WBC of 40,000, chemistry showed sodium of 123, potassium of 2.5, chloride of 78, acute kidney injury with creatinine of 1.43, normal lactic acid. LFTs were normal. Lipase was only 57.. CT of the abdomen showed acute  enteritis without necrotizing, abscess or fluid collection. No biliary duct dilatation or gallstones. Patient admitted to stepdown unit for sepsis due to acute pancreatitis.  Hospital course  Principal problem  acute on chronic pancreatitis. Sepsis resolved. Leukocytosis improved (16 K today), remains afebrile. Denies any new medications or recent illness. CT without pancreatic abscess, necrosis or fluid. No gallstones or biliary dilatation. She had a normal HIDA scan in 2017 when she had acute pancreatitis.  -Abdominal pain symptoms improved, no nausea, vomiting or diarrhea. Tolerating advanced diet. -Added Creon given her recurrent pancreatitis symptoms.    Sepsis with gram-positive bacteremia No clear source. 1/2 culture from admission growing bacillus. Discussed with Dr. Ninetta Lights who recommended repeating blood culture and following up as source is unclear and not sure if this is a true infection. Placed on empiric vancomycin and Zosyn. Repeat blood culture negative. However given picture of sepsis on presentation will treat her empirically for10 days. Discharged on oral Augmentin for 7 more days.  Severe hyponatremia Secondary to severe dehydration and HCTZ use. Improving with IV fluids. HCTZ discontinued.   hypokalemia and hypomagnesemia Secondary to GI loss. Replenished and improved  Chronic fibromyalgia Continue home pain medications.  Depression Continue Celexa.  Hypothyroidism Continue Synthroid  Morbid obesity (BMI of 50) Needs counseling on diet and excised to loose weight.  Essential hypertension Blood pressure stable. Resume telmisartan. Will discontinue HCTZ given severe hyponatremia.   Family Communication  : None at bedside . discussed with father during hospital stay  Disposition Plan  : Home   Consults  :  None  Procedures  : CT abdomen     Discharge Instructions   Allergies as of 04/26/2017  Reactions   Soap    Any soaps with  fragrance cause migraines   Sulfa Antibiotics Nausea And Vomiting      Medication List    STOP taking these medications   telmisartan-hydrochlorothiazide 80-12.5 MG tablet Commonly known as:  MICARDIS HCT     TAKE these medications   albuterol 108 (90 Base) MCG/ACT inhaler Commonly known as:  PROVENTIL HFA INHALE TWO PUFFS BY MOUTH EVERY SIX HOURS AS NEEDED What changed:  how much to take  how to take this  when to take this  reasons to take this  additional instructions   amoxicillin-clavulanate 875-125 MG tablet Commonly known as:  AUGMENTIN Take 1 tablet by mouth 2 (two) times daily.   amphetamine-dextroamphetamine 10 MG tablet Commonly known as:  ADDERALL Take 1 tablet (10 mg total) by mouth 2 (two) times daily. What changed:  Another medication with the same name was removed. Continue taking this medication, and follow the directions you see here.   azelastine 0.05 % ophthalmic solution Commonly known as:  OPTIVAR PLACE TWO DRIPS INTO BOTH EYES TWICE DAILY What changed:  See the new instructions.   B-COMPLEX BALANCED PO Take 1 tablet by mouth daily.   calcium carbonate 600 MG Tabs tablet Commonly known as:  OS-CAL Take 600 mg by mouth daily.   carisoprodol 350 MG tablet Commonly known as:  SOMA Take 1 tablet (350 mg total) by mouth 4 (four) times daily as needed for muscle spasms.   cetirizine 10 MG tablet Commonly known as:  ZYRTEC Take 10 mg by mouth daily as needed for allergies.   cholecalciferol 400 units Tabs tablet Commonly known as:  VITAMIN D Take 400 Units by mouth daily.   citalopram 20 MG tablet Commonly known as:  CELEXA TAKE 1 TABLET (20 MG TOTAL) BY MOUTH DAILY.   dicyclomine 20 MG tablet Commonly known as:  BENTYL Take 1 tablet (20 mg total) by mouth 3 (three) times daily before meals. As needed for bowel cramping   esomeprazole 40 MG capsule Commonly known as:  NEXIUM TAKE ONE CAPSULE BY MOUTH ONE TIME DAILY What changed:   See the new instructions.   Fluticasone-Salmeterol 500-50 MCG/DOSE Aepb Commonly known as:  ADVAIR DISKUS Inhale 1 puff into the lungs 2 (two) times daily.   lipase/protease/amylase 14782 units Cpep capsule Commonly known as:  CREON Take 2 capsules (24,000 Units total) by mouth 3 (three) times daily with meals.   montelukast 10 MG tablet Commonly known as:  SINGULAIR Take 1 tablet (10 mg total) by mouth every morning.   multivitamin tablet Take 1 tablet by mouth daily.   ondansetron 4 MG tablet Commonly known as:  ZOFRAN Take 1 tablet (4 mg total) by mouth every 6 (six) hours. What changed:  when to take this  reasons to take this   pregabalin 150 MG capsule Commonly known as:  LYRICA Take 1 capsule (150 mg total) by mouth 3 (three) times daily. What changed:  when to take this   ranitidine 150 MG tablet Commonly known as:  ZANTAC Take 150 mg by mouth 2 (two) times daily as needed for heartburn.   SUMAtriptan 100 MG tablet Commonly known as:  IMITREX Take 1 tablet (100 mg total) by mouth every 2 (two) hours as needed for migraine. No more than 2 doses in 24 hours.   SYNTHROID 75 MCG tablet Generic drug:  levothyroxine TAKE 1 TABLET (75 MCG TOTAL) BY MOUTH DAILY BEFORE BREAKFAST.   SYSTANE 0.4-0.3 % Soln  Generic drug:  Polyethyl Glycol-Propyl Glycol Apply 1 drop to eye 3 (three) times daily as needed (dry eyes).   telmisartan 80 MG tablet Commonly known as:  MICARDIS Take 1 tablet (80 mg total) by mouth daily.   traMADol 50 MG tablet Commonly known as:  ULTRAM Take 1 tablet (50 mg total) by mouth 4 (four) times daily.   vitamin C 500 MG tablet Commonly known as:  ASCORBIC ACID Take 1,000 mg by mouth daily.      Follow-up Information    Sherren Mocha, MD. Schedule an appointment as soon as possible for a visit in 1 week(s).   Specialty:  Family Medicine Contact information: [address] [place] [address] [phone]          Allergies   Allergen Reactions  . Soap     Any soaps with fragrance cause migraines  . Sulfa Antibiotics Nausea And Vomiting      Procedures/Studies: Ct Abdomen Pelvis W Contrast  Result Date: 04/22/2017 CLINICAL DATA:  Epigastric abdominal pain.  Leukocytosis. EXAM: CT ABDOMEN AND PELVIS WITH CONTRAST TECHNIQUE: Multidetector CT imaging of the abdomen and pelvis was performed using the standard protocol following bolus administration of intravenous contrast. CONTRAST:  ISOVUE-300 IOPAMIDOL (ISOVUE-300) INJECTION 61% COMPARISON:  Abdominal radiographs from earlier today. FINDINGS: Lower chest: No significant pulmonary nodules or acute consolidative airspace disease. Hepatobiliary: Diffuse hepatic steatosis. No definite liver surface irregularity. No liver mass. Normal gallbladder with no radiopaque cholelithiasis. No biliary ductal dilatation. Pancreas: Diffuse pancreatic parenchymal thickening with prominent diffuse peripancreatic fat stranding and ill-defined fluid, compatible with acute pancreatitis. No definite regions of pancreatic parenchymal nonenhancement or pancreatic parenchymal gas to suggest necrotizing pancreatitis. No measurable peripancreatic fluid collections. No pancreatic mass or duct dilation. Spleen: Normal size spleen. Punctate scattered granulomatous splenic calcifications. No splenic mass. Adrenals/Urinary Tract: Normal adrenals. Hydronephrosis. Simple 1.9 cm lateral interpolar right renal cyst. Normal bladder. Stomach/Bowel: Grossly normal stomach. Normal caliber small bowel with no small bowel wall thickening. Normal appendix. Minimal sigmoid diverticulosis, with no large bowel wall thickening or pericolonic fat stranding. Vascular/Lymphatic: Atherosclerotic nonaneurysmal abdominal aorta. Patent portal, splenic, hepatic and renal veins. No pathologically enlarged lymph nodes in the abdomen or pelvis. Reproductive: Grossly normal uterus.  No adnexal mass. Other: No pneumoperitoneum,  ascites or focal fluid collection. Small fat containing supraumbilical midline ventral abdominal hernia. Musculoskeletal: No aggressive appearing focal osseous lesions. Mild thoracolumbar spondylosis. IMPRESSION: 1. Prominent acute pancreatitis. No evidence of necrotizing pancreatitis or measurable peripancreatic fluid collections. 2. No radiopaque cholelithiasis.  No biliary ductal dilatation. 3. Diffuse hepatic steatosis. 4.  Aortic Atherosclerosis (ICD10-I70.0). 5. Small fat containing supraumbilical midline ventral abdominal hernia . Electronically Signed   By: Delbert Phenix M.D.   On: 04/22/2017 20:10   Dg Abd Acute W/chest  Result Date: 04/22/2017 CLINICAL DATA:  Abdominal pain and nausea; fever EXAM: DG ABDOMEN ACUTE W/ 1V CHEST COMPARISON:  CT abdomen and pelvis February 03, 2016. Chest radiograph August 31, 2013 FINDINGS: PA chest: There is no edema or consolidation. The heart size and pulmonary vascularity are normal. No adenopathy. There is aortic atherosclerosis. Supine and upright abdomen: There is no appreciable bowel dilatation. There are a few scattered air-fluid levels. There is moderate stool in the colon. There are small pelvic phleboliths. IMPRESSION: Scattered air-fluid levels. Question early ileus or enteritis. There is no appreciable bowel obstruction. No free air. No edema or consolidation.  There is aortic atherosclerosis. Aortic Atherosclerosis (ICD10-I70.0). Electronically Signed   By: Bretta Bang  III M.D.   On: 04/22/2017 15:13       Subjective: Abdominal pain improved. Tolerating advanced diet.  Discharge Exam: Vitals:   04/26/17 0511 04/26/17 1347  BP: 135/60 (!) 102/53  Pulse: 85 93  Resp: 18 18  Temp: 98.7 F (37.1 C) 98.8 F (37.1 C)   Vitals:   04/25/17 2200 04/26/17 0511 04/26/17 0805 04/26/17 1347  BP: 114/60 135/60  (!) 102/53  Pulse: 96 85  93  Resp: 16 18  18   Temp: 99.4 F (37.4 C) 98.7 F (37.1 C)  98.8 F (37.1 C)  TempSrc: Oral Oral  Oral   SpO2: 96% 97% 94% 98%  Weight:  128.8 kg (284 lb)    Height:        General: Middle aged morbidly obese female not in distress HEENT: Moist mucosa, supple neck Chest: Clear to auscultation bilaterally CVS: Normal S1 and S2, no murmurs GI: Soft, mild epigastric tenderness, nondistended, bowel sounds present Musculoskeletal: Warm, no edema    The results of significant diagnostics from this hospitalization (including imaging, microbiology, ancillary and laboratory) are listed below for reference.     Microbiology: Recent Results (from the past 240 hour(s))  Culture, blood (Routine X 2) w Reflex to ID Panel     Status: None (Preliminary result)   Collection Time: 04/22/17 10:48 PM  Result Value Ref Range Status   Specimen Description BLOOD LEFT ANTECUBITAL  Final   Special Requests   Final    BOTTLES DRAWN AEROBIC AND ANAEROBIC Blood Culture results may not be optimal due to an excessive volume of blood received in culture bottles   Culture   Final    NO GROWTH 3 DAYS Performed at [facility], [address], [place], [place] [address]    Report Status PENDING  Incomplete  Culture, blood (Routine X 2) w Reflex to ID Panel     Status: Abnormal   Collection Time: 04/22/17 10:48 PM  Result Value Ref Range Status   Specimen Description BLOOD BLOOD RIGHT HAND  Final   Special Requests IN PEDIATRIC BOTTLE Blood Culture adequate volume  Final   Culture  Setup Time   Final    GRAM POSITIVE RODS IN PEDIATRIC BOTTLE CRITICAL RESULT CALLED TO, READ BACK BY AND VERIFIED WITH: Haze Boyden, PHARMD 1938 04/23/2017 T. TYSOR    Culture (A)  Final    BACILLUS SPECIES Standardized susceptibility testing for this organism is not available. Performed at [facility], [address] [place], [place] [address]    Report Status 04/25/2017 FINAL  Final  Blood Culture ID Panel (Reflexed)     Status: None   Collection Time: 04/22/17 10:48 PM  Result Value Ref Range Status    Enterococcus species NOT DETECTED NOT DETECTED Final   Listeria monocytogenes NOT DETECTED NOT DETECTED Final   Staphylococcus species NOT DETECTED NOT DETECTED Final   Staphylococcus aureus NOT DETECTED NOT DETECTED Final   Streptococcus species NOT DETECTED NOT DETECTED Final   Streptococcus agalactiae NOT DETECTED NOT DETECTED Final   Streptococcus pneumoniae NOT DETECTED NOT DETECTED Final   Streptococcus pyogenes NOT DETECTED NOT DETECTED Final   Acinetobacter baumannii NOT DETECTED NOT DETECTED Final   Enterobacteriaceae species NOT DETECTED NOT DETECTED Final   Enterobacter cloacae complex NOT DETECTED NOT DETECTED Final   Escherichia coli NOT DETECTED NOT DETECTED Final   Klebsiella oxytoca NOT DETECTED NOT DETECTED Final   Klebsiella pneumoniae NOT DETECTED NOT DETECTED Final   Proteus species  NOT DETECTED NOT DETECTED Final   Serratia marcescens NOT DETECTED NOT DETECTED Final   Haemophilus influenzae NOT DETECTED NOT DETECTED Final   Neisseria meningitidis NOT DETECTED NOT DETECTED Final   Pseudomonas aeruginosa NOT DETECTED NOT DETECTED Final   Candida albicans NOT DETECTED NOT DETECTED Final   Candida glabrata NOT DETECTED NOT DETECTED Final   Candida krusei NOT DETECTED NOT DETECTED Final   Candida parapsilosis NOT DETECTED NOT DETECTED Final   Candida tropicalis NOT DETECTED NOT DETECTED Final  MRSA PCR Screening     Status: None   Collection Time: 04/22/17 10:50 PM  Result Value Ref Range Status   MRSA by PCR NEGATIVE NEGATIVE Final    Comment:        The GeneXpert MRSA Assay (FDA approved for NASAL specimens only), is one component of a comprehensive MRSA colonization surveillance program. It is not intended to diagnose MRSA infection nor to guide or monitor treatment for MRSA infections.   Culture, blood (routine x 2)     Status: None (Preliminary result)   Collection Time: 04/25/17 10:30 AM  Result Value Ref Range Status   Specimen Description BLOOD LEFT  ANTECUBITAL  Final   Special Requests AEROBIC BOTTLE ONLY Blood Culture adequate volume  Final   Culture   Final    NO GROWTH 1 DAY Performed at [facility], [address] [place], [place] [address]    Report Status PENDING  Incomplete  Culture, blood (routine x 2)     Status: None (Preliminary result)   Collection Time: 04/25/17 10:30 AM  Result Value Ref Range Status   Specimen Description BLOOD LEFT HAND  Final   Special Requests AEROBIC BOTTLE ONLY Blood Culture adequate volume  Final   Culture   Final    NO GROWTH 1 DAY Performed at [facility], [address] [place], [place] [address]    Report Status PENDING  Incomplete     Labs: BNP (last 3 results) No results for input(s): BNP in the last 8760 hours. Basic Metabolic Panel:  Recent Labs Lab 04/22/17 1913 04/23/17 0137 04/23/17 0206 04/23/17 1542 04/24/17 0336 04/25/17 0445 04/26/17 0458  NA 123* 122*  --  127* 129* 133*  --   K 2.5* 2.4*  --  3.1* 3.5 4.5  --   CL 78* 81*  --  86* 91* 99*  --   CO2 31 29  --  30 29 29   --   GLUCOSE 133* 130*  --  124* 129* 110*  --   BUN 19 18  --  13 9 7   --   CREATININE 1.43* 0.96  --  0.78 0.64 0.70 0.70  CALCIUM 8.5* 7.9*  --  8.0* 7.9* 7.9*  --   MG  --   --  1.6*  --   --   --   --    Liver Function Tests:  Recent Labs Lab 04/22/17 1505 04/22/17 1913 04/23/17 0137 04/24/17 0336  AST 27 28 28 21   ALT 21 22 23 21   ALKPHOS 146* 115 108 96  BILITOT 1.6* 1.6* 1.8* 1.0  PROT 6.8 7.6 6.7 5.9*  ALBUMIN 3.9 3.4* 3.1* 2.5*    Recent Labs Lab 04/22/17 1505 04/22/17 1913  LIPASE 86* 57*  AMYLASE 140*  --    No results for input(s): AMMONIA in the last 168 hours. CBC:  Recent Labs Lab 04/22/17 1913 04/23/17 0137 04/23/17 1542 04/24/17 0336 04/25/17 0445 04/26/17 0458  WBC  40.1* 36.2* 27.3* 21.7* 17.1* 16.9*  NEUTROABS 34.1*  --   --   --   --   --   HGB 14.5 12.9 12.2 11.2* 10.3* 10.9*  HCT 39.9 36.1 34.3* 32.9* 30.7* 32.8*  MCV 83.5  83.2 84.1 85.0 86.2 87.2  PLT 220 215 205 196 186 228   Cardiac Enzymes: No results for input(s): CKTOTAL, CKMB, CKMBINDEX, TROPONINI in the last 168 hours. BNP: Invalid input(s): POCBNP CBG: No results for input(s): GLUCAP in the last 168 hours. D-Dimer No results for input(s): DDIMER in the last 72 hours. Hgb A1c No results for input(s): HGBA1C in the last 72 hours. Lipid Profile No results for input(s): CHOL, HDL, LDLCALC, TRIG, CHOLHDL, LDLDIRECT in the last 72 hours. Thyroid function studies No results for input(s): TSH, T4TOTAL, T3FREE, THYROIDAB in the last 72 hours.  Invalid input(s): FREET3 Anemia work up No results for input(s): VITAMINB12, FOLATE, FERRITIN, TIBC, IRON, RETICCTPCT in the last 72 hours. Urinalysis    Component Value Date/Time   COLORURINE YELLOW 02/03/2016 1632   APPEARANCEUR CLEAR 02/03/2016 1632   LABSPEC 1.005 02/03/2016 1632   PHURINE 6.5 02/03/2016 1632   GLUCOSEU NEGATIVE 02/03/2016 1632   HGBUR SMALL (A) 02/03/2016 1632   BILIRUBINUR moderate (A) 04/22/2017 1513   BILIRUBINUR neg 08/23/2014 1726   KETONESUR small (15) (A) 04/22/2017 1513   KETONESUR NEGATIVE 02/03/2016 1632   PROTEINUR >=300 (A) 04/22/2017 1513   PROTEINUR NEGATIVE 02/03/2016 1632   UROBILINOGEN 1.0 04/22/2017 1513   NITRITE Negative 04/22/2017 1513   NITRITE NEGATIVE 02/03/2016 1632   LEUKOCYTESUR Large (3+) (A) 04/22/2017 1513   Sepsis Labs Invalid input(s): PROCALCITONIN,  WBC,  LACTICIDVEN Microbiology Recent Results (from the past 240 hour(s))  Culture, blood (Routine X 2) w Reflex to ID Panel     Status: None (Preliminary result)   Collection Time: 04/22/17 10:48 PM  Result Value Ref Range Status   Specimen Description BLOOD LEFT ANTECUBITAL  Final   Special Requests   Final    BOTTLES DRAWN AEROBIC AND ANAEROBIC Blood Culture results may not be optimal due to an excessive volume of blood received in culture bottles   Culture   Final    NO GROWTH 3  DAYS Performed at [facility], [address] [place], [place] [address]    Report Status PENDING  Incomplete  Culture, blood (Routine X 2) w Reflex to ID Panel     Status: Abnormal   Collection Time: 04/22/17 10:48 PM  Result Value Ref Range Status   Specimen Description BLOOD BLOOD RIGHT HAND  Final   Special Requests IN PEDIATRIC BOTTLE Blood Culture adequate volume  Final   Culture  Setup Time   Final    GRAM POSITIVE RODS IN PEDIATRIC BOTTLE CRITICAL RESULT CALLED TO, READ BACK BY AND VERIFIED WITH: Haze Boyden, PHARMD 1938 04/23/2017 T. TYSOR    Culture (A)  Final    BACILLUS SPECIES Standardized susceptibility testing for this organism is not available. Performed at [facility], [address] [place], [place] [address]    Report Status 04/25/2017 FINAL  Final  Blood Culture ID Panel (Reflexed)     Status: None   Collection Time: 04/22/17 10:48 PM  Result Value Ref Range Status   Enterococcus species NOT DETECTED NOT DETECTED Final   Listeria monocytogenes NOT DETECTED NOT DETECTED Final   Staphylococcus species NOT DETECTED NOT DETECTED Final   Staphylococcus aureus NOT DETECTED NOT DETECTED Final   Streptococcus  species NOT DETECTED NOT DETECTED Final   Streptococcus agalactiae NOT DETECTED NOT DETECTED Final   Streptococcus pneumoniae NOT DETECTED NOT DETECTED Final   Streptococcus pyogenes NOT DETECTED NOT DETECTED Final   Acinetobacter baumannii NOT DETECTED NOT DETECTED Final   Enterobacteriaceae species NOT DETECTED NOT DETECTED Final   Enterobacter cloacae complex NOT DETECTED NOT DETECTED Final   Escherichia coli NOT DETECTED NOT DETECTED Final   Klebsiella oxytoca NOT DETECTED NOT DETECTED Final   Klebsiella pneumoniae NOT DETECTED NOT DETECTED Final   Proteus species NOT DETECTED NOT DETECTED Final   Serratia marcescens NOT DETECTED NOT DETECTED Final   Haemophilus influenzae NOT DETECTED NOT DETECTED Final   Neisseria meningitidis NOT  DETECTED NOT DETECTED Final   Pseudomonas aeruginosa NOT DETECTED NOT DETECTED Final   Candida albicans NOT DETECTED NOT DETECTED Final   Candida glabrata NOT DETECTED NOT DETECTED Final   Candida krusei NOT DETECTED NOT DETECTED Final   Candida parapsilosis NOT DETECTED NOT DETECTED Final   Candida tropicalis NOT DETECTED NOT DETECTED Final  MRSA PCR Screening     Status: None   Collection Time: 04/22/17 10:50 PM  Result Value Ref Range Status   MRSA by PCR NEGATIVE NEGATIVE Final    Comment:        The GeneXpert MRSA Assay (FDA approved for NASAL specimens only), is one component of a comprehensive MRSA colonization surveillance program. It is not intended to diagnose MRSA infection nor to guide or monitor treatment for MRSA infections.   Culture, blood (routine x 2)     Status: None (Preliminary result)   Collection Time: 04/25/17 10:30 AM  Result Value Ref Range Status   Specimen Description BLOOD LEFT ANTECUBITAL  Final   Special Requests AEROBIC BOTTLE ONLY Blood Culture adequate volume  Final   Culture   Final    NO GROWTH 1 DAY Performed at [facility], [address] [place], [place] [id number]    Report Status PENDING  Incomplete  Culture, blood (routine x 2)     Status: None (Preliminary result)   Collection Time: 04/25/17 10:30 AM  Result Value Ref Range Status   Specimen Description BLOOD LEFT HAND  Final   Special Requests AEROBIC BOTTLE ONLY Blood Culture adequate volume  Final   Culture   Final    NO GROWTH 1 DAY Performed at [facility], [address] [place], [place] [id number]    Report Status PENDING  Incomplete     Time coordinating discharge: Over 30 minutes  SIGNED:   Eddie NorthHUNGEL, Ciearra Rufo, MD  [facility] 04/26/2017, 5:02 PM Pager   If 7PM-7AM, please contact night-coverage [email or web address] Password [email or web address]

## 2017-04-26 NOTE — Discharge Instructions (Signed)
Acute Pancreatitis Acute pancreatitis is a condition in which the pancreas suddenly gets irritated and swollen (has inflammation). The pancreas is a large gland behind the stomach. It makes enzymes that help to digest food. The pancreas also makes hormones that help to control your blood sugar. Acute pancreatitis happens when the enzymes attack the pancreas and damage it. Most attacks last a couple of days and can cause serious problems. Follow these instructions at home: Eating and drinking  Follow instructions from your doctor about diet. You may need to: ? Avoid alcohol. ? Limit how much fat is in your diet.  Eat small meals often. Avoid eating big meals.  Drink enough fluid to keep your pee (urine) clear or pale yellow.  Do not drink alcohol if it caused your condition. General instructions  Take over-the-counter and prescription medicines only as told by your doctor.  Do not use any tobacco products. These include cigarettes, chewing tobacco, and e-cigarettes. If you need help quitting, ask your doctor.  Get plenty of rest.  If directed, check your blood sugar at home as told by your doctor.  Keep all follow-up visits as told by your doctor. This is important. Contact a doctor if:  You do not get better as quickly as expected.  You have new symptoms.  Your symptoms get worse.  You have lasting pain or weakness.  You continue to feel sick to your stomach (nauseous).  You get better and then you have another pain attack.  You have a fever. Get help right away if:  You cannot eat or keep fluids down.  Your pain becomes very bad.  Your skin or the white part of your eyes turns yellow (jaundice).  You throw up (vomit).  You feel dizzy or you pass out (faint).  Your blood sugar is high (over 300 mg/dL). This information is not intended to replace advice given to you by your health care provider. Make sure you discuss any questions you have with your health care  provider. Document Released: 03/15/2008 Document Revised: 03/04/2016 Document Reviewed: 07/01/2015 Elsevier Interactive Patient Education  2018 Elsevier Inc.  

## 2017-04-26 NOTE — Progress Notes (Signed)
Pharmacy Antibiotic Note  Gloria GrovesBarbara Carter is a 51 y.o. female admitted on 7/13 with severe abdominal pain, CT shows acute pancreatitis.  Subsequent blood cultures grew gram positive rods.  Pharmacy has been consulted for Zosyn and Vancomycin dosing.  Today, 04/26/2017: Day #4 zosyn, Day #3 vancomycin  Vanco trough = 15 mcg/mL WBC elevated, stable from 7/16 Afebrile SCr 0.7, stable with CrCl > 100 ml/min  Plan:  Continue Zosyn 3.375g IV Q8H infused over 4hrs.   Continue Vancomycin 1250 mg IV q12h as vancomycin trough acceptable (goal trough 10-15 mg/mL)  Daily SCr d/t increased risk for nephrotoxicity while on Vanc/Zosyn combination therapy  Consider repeat vancomycin trough Friday if remains on vancomycin to r/o accumulation of drug  Follow up renal fxn, culture results, and clinical course.  Follow-up ability to narrow antibiotics (ie. Stop vancomycin) with repeat blood cultures and narrow zosyn therapy   Height: 5\' 2"  (157.5 cm) Weight: 284 lb (128.8 kg) IBW/kg (Calculated) : 50.1  Temp (24hrs), Avg:98.9 F (37.2 C), Min:98.7 F (37.1 C), Max:99.4 F (37.4 C)   Recent Labs Lab 04/22/17 1808  04/23/17 0137 04/23/17 1542 04/24/17 0336 04/25/17 0445 04/26/17 0458 04/26/17 0923  WBC  --   < > 36.2* 27.3* 21.7* 17.1* 16.9*  --   CREATININE  --   < > 0.96 0.78 0.64 0.70 0.70  --   LATICACIDVEN <0.30*  --   --   --   --   --   --   --   VANCOTROUGH  --   --   --   --   --   --   --  15  < > = values in this interval not displayed.  Estimated Creatinine Clearance: 104.8 mL/min (by C-G formula based on SCr of 0.7 mg/dL).    Allergies  Allergen Reactions  . Soap     Any soaps with fragrance cause migraines  . Sulfa Antibiotics Nausea And Vomiting    Antimicrobials this admission: 7/13 zosyn >>  7/14 Vanc >>   Dose adjustments this admission: 7/17 0900 VT = 15 mcg/mL on 1250mg  q12h (prior to 5th dose)  Microbiology results: 7/13 BCx: 1/2 Bacillus sp, 1/2  NGTD (Blood Culture results may not be optimal due to an excessive volume of blood received in culture bottles) 7/14  BCID did not identify 7/13 MRSA PCR: neg 7/16 BCx:  sent  Thank you for allowing pharmacy to be a part of this patient's care.  Juliette Alcideustin Arneda Sappington, PharmD, BCPS.   Pager: [phone] 04/26/2017 11:50 AM

## 2017-04-28 LAB — CULTURE, BLOOD (ROUTINE X 2): Culture: NO GROWTH

## 2017-04-29 ENCOUNTER — Ambulatory Visit (INDEPENDENT_AMBULATORY_CARE_PROVIDER_SITE_OTHER): Payer: 59 | Admitting: Family Medicine

## 2017-04-29 ENCOUNTER — Encounter: Payer: Self-pay | Admitting: Physician Assistant

## 2017-04-29 VITALS — BP 117/76 | HR 84 | Temp 98.0°F | Resp 18 | Ht 62.99 in | Wt 267.6 lb

## 2017-04-29 DIAGNOSIS — K529 Noninfective gastroenteritis and colitis, unspecified: Secondary | ICD-10-CM | POA: Diagnosis not present

## 2017-04-29 DIAGNOSIS — A419 Sepsis, unspecified organism: Secondary | ICD-10-CM | POA: Diagnosis not present

## 2017-04-29 DIAGNOSIS — Z8719 Personal history of other diseases of the digestive system: Secondary | ICD-10-CM

## 2017-04-29 DIAGNOSIS — R6520 Severe sepsis without septic shock: Secondary | ICD-10-CM

## 2017-04-29 LAB — POCT URINALYSIS DIP (MANUAL ENTRY)
Bilirubin, UA: NEGATIVE
Glucose, UA: NEGATIVE mg/dL
Ketones, POC UA: NEGATIVE mg/dL
Nitrite, UA: NEGATIVE
Protein Ur, POC: NEGATIVE mg/dL
Spec Grav, UA: 1.01
Urobilinogen, UA: 0.2 U/dL
pH, UA: 7

## 2017-04-29 LAB — POCT CBC
GRANULOCYTE PERCENT: 75.8 % (ref 37–80)
HCT, POC: 36.4 % — AB (ref 37.7–47.9)
HEMOGLOBIN: 12.1 g/dL — AB (ref 12.2–16.2)
Lymph, poc: 1.8 (ref 0.6–3.4)
MCH: 29 pg (ref 27–31.2)
MCHC: 33.3 g/dL (ref 31.8–35.4)
MCV: 87 fL (ref 80–97)
MID (CBC): 1 — AB (ref 0–0.9)
MPV: 8.2 fL (ref 0–99.8)
PLATELET COUNT, POC: 320 10*3/uL (ref 142–424)
POC Granulocyte: 8.7 — AB (ref 2–6.9)
POC LYMPH PERCENT: 15.3 %L (ref 10–50)
POC MID %: 8.9 %M (ref 0–12)
RBC: 4.19 M/uL (ref 4.04–5.48)
RDW, POC: 13.4 %
WBC: 11.5 10*3/uL — AB (ref 4.6–10.2)

## 2017-04-29 NOTE — Progress Notes (Signed)
Subjective:    Patient ID: Gloria Carter, female    DOB: Sep 03, 1966, 51 y.o.   MRN: [id number]  04/29/2017  Hospitalization Follow-up and Abdominal Pain (X 2 weeks- lower back pain)   HPI This 51 y.o. female presents for hospital follow-up for acute pancreatitis versus enteritis. Amylase and Lipase minimally elevated yet CT abdomen confirmed acute pancreatitis.   No improvement yet now worsening.  Symptoms did improve during admission.  Acutely worse abdominal pain this morning; a little off today.  Severity 7/10.  Pain 6/10 yesterday.  Discharge day, pain was 6/10.   No fever yet sweats.  Flushing.  No nausea; no vomiting.  +diarrhea; having loose stools; had Colace the last day of admission.   No constipation.  No bloody stools. Good po intake since discharge: Crackers, two  Pieces of bread, baked potato, chicken pot pie for lunch yesterday.  Drinking water; drinking 2 bottles of water per day; sometimes 3 bottles of water.  Cranberry juice yesterday.  [place] ice also.  Has taken two days of Augmentin since hospital discharge; none today.  Pulse 108 during admission; blood pressure low for patient during admission.  Usually runs diastolic 85.    Admission WBC count of 41,000.  Discharge WBC 16,000.  Diagnosed with sepsis during admission; treated with antibiotic therapy and discharged on Augmentin.   Has not returned to work. Too weak and continues to have ongoing pain.  Sodium and potassium both very low upon admission; much improved an normal at discharge.  Blood cx x 1 positive during admission; repeat blood cultures obtained per ID recommendations.    BP Readings from Last 3 Encounters:  04/29/17 117/76  04/26/17 (!) 102/53  04/22/17 138/82   Wt Readings from Last 3 Encounters:  04/29/17 267 lb 9.6 oz (121.4 kg)  04/26/17 284 lb (128.8 kg)  04/22/17 271 lb (122.9 kg)   Immunization History  Administered Date(s) Administered  . Influenza Split 07/13/2013, 08/08/2014  .  Influenza-Unspecified 07/11/2016  . Pneumococcal Polysaccharide-23 08/10/2008  . Tdap 12/07/2009    Review of Systems  Constitutional: Positive for diaphoresis and fatigue. Negative for chills and fever.  Eyes: Negative for visual disturbance.  Respiratory: Negative for cough and shortness of breath.   Cardiovascular: Negative for chest pain, palpitations and leg swelling.  Gastrointestinal: Positive for abdominal pain and nausea. Negative for abdominal distention, anal bleeding, blood in stool, constipation, diarrhea, rectal pain and vomiting.  Endocrine: Negative for cold intolerance, heat intolerance, polydipsia, polyphagia and polyuria.  Neurological: Negative for dizziness, tremors, seizures, syncope, facial asymmetry, speech difficulty, weakness, light-headedness, numbness and headaches.    Past Medical History:  Diagnosis Date  . ADD (attention deficit disorder)   . Allergy    ENVIRONMENTAL  . Arthritis    knees  . Asthma   . Depression   . Difficulty sleeping   . Dysphagia   . Fibromyalgia   . Fibromyalgia   . GERD (gastroesophageal reflux disease)   . Hernia of abdominal wall   . Hyperlipidemia   . Hypertension   . IBS (irritable bowel syndrome)   . Migraine   . Numbness and tingling in hands   . Obesity   . Swelling of both ankles   . Thyroid disease    Past Surgical History:  Procedure Laterality Date  . INSERTION OF MESH N/A 03/28/2014   Procedure: INSERTION OF MESH;  Surgeon: Velora Heckler, MD;  Location: [facility];  Service: General;  Laterality: N/A;  . TONSILECTOMY/ADENOIDECTOMY WITH MYRINGOTOMY    .  VENTRAL HERNIA REPAIR N/A 03/28/2014   Procedure: LAPAROSCOPIC VENTRAL HERNIA REPAIR WITH MESH;  Surgeon: Velora Hecklerodd M Gerkin, MD;  Location: [facility];  Service: General;  Laterality: N/A;  . WISDOM TOOTH EXTRACTION     Allergies  Allergen Reactions  . Soap     Any soaps with fragrance cause migraines  . Sulfa Antibiotics Nausea And Vomiting   Current Outpatient  Prescriptions  Medication Sig Dispense Refill  . albuterol (PROVENTIL HFA) 108 (90 BASE) MCG/ACT inhaler INHALE TWO PUFFS BY MOUTH EVERY SIX HOURS AS NEEDED (Patient taking differently: Inhale 2 puffs into the lungs every 6 (six) hours as needed for wheezing or shortness of breath. INHALE TWO PUFFS BY MOUTH EVERY SIX HOURS AS NEEDED) 18 Inhaler 11  . amoxicillin-clavulanate (AUGMENTIN) 875-125 MG tablet Take 1 tablet by mouth 2 (two) times daily. 14 tablet 0  . amphetamine-dextroamphetamine (ADDERALL) 10 MG tablet Take 1 tablet (10 mg total) by mouth 2 (two) times daily. 60 tablet 0  . azelastine (OPTIVAR) 0.05 % ophthalmic solution PLACE TWO DRIPS INTO BOTH EYES TWICE DAILY (Patient taking differently: PLACE TWO DRIPS INTO BOTH EYES TWICE DAILY FOR DRY EYES) 6 mL 5  . B Complex-C-Folic Acid (B-COMPLEX BALANCED PO) Take 1 tablet by mouth daily.     . calcium carbonate (OS-CAL) 600 MG TABS Take 600 mg by mouth daily.    . carisoprodol (SOMA) 350 MG tablet Take 1 tablet (350 mg total) by mouth 4 (four) times daily as needed for muscle spasms. 120 tablet 2  . cetirizine (ZYRTEC) 10 MG tablet Take 10 mg by mouth daily as needed for allergies.     . cholecalciferol (VITAMIN D) 400 UNITS TABS Take 400 Units by mouth daily.    . citalopram (CELEXA) 20 MG tablet TAKE 1 TABLET (20 MG TOTAL) BY MOUTH DAILY. 90 tablet 1  . dicyclomine (BENTYL) 20 MG tablet Take 1 tablet (20 mg total) by mouth 3 (three) times daily before meals. As needed for bowel cramping 90 tablet 0  . esomeprazole (NEXIUM) 40 MG capsule TAKE ONE CAPSULE BY MOUTH ONE TIME DAILY (Patient taking differently: TAKE ONE CAPSULE BY MOUTH ONE TIME DAILY FOR INDIGESTION) 90 capsule 0  . Fluticasone-Salmeterol (ADVAIR DISKUS) 500-50 MCG/DOSE AEPB Inhale 1 puff into the lungs 2 (two) times daily. 60 each 3  . lipase/protease/amylase (CREON) 12000 units CPEP capsule Take 2 capsules (24,000 Units total) by mouth 3 (three) times daily with meals. 90  capsule 0  . montelukast (SINGULAIR) 10 MG tablet Take 1 tablet (10 mg total) by mouth every morning. 90 tablet 3  . Multiple Vitamin (MULTIVITAMIN) tablet Take 1 tablet by mouth daily.    . ondansetron (ZOFRAN) 4 MG tablet Take 1 tablet (4 mg total) by mouth every 6 (six) hours. (Patient taking differently: Take 4 mg by mouth every 8 (eight) hours as needed for nausea or vomiting. ) 60 tablet 1  . Polyethyl Glycol-Propyl Glycol (SYSTANE) 0.4-0.3 % SOLN Apply 1 drop to eye 3 (three) times daily as needed (dry eyes).    . pregabalin (LYRICA) 150 MG capsule Take 1 capsule (150 mg total) by mouth 3 (three) times daily. (Patient taking differently: Take 150 mg by mouth 2 (two) times daily. ) 270 capsule 1  . ranitidine (ZANTAC) 150 MG tablet Take 150 mg by mouth 2 (two) times daily as needed for heartburn.    . SUMAtriptan (IMITREX) 100 MG tablet Take 1 tablet (100 mg total) by mouth every 2 (two) hours as  needed for migraine. No more than 2 doses in 24 hours. 8 tablet 3  . SYNTHROID 75 MCG tablet TAKE 1 TABLET (75 MCG TOTAL) BY MOUTH DAILY BEFORE BREAKFAST. 90 tablet 0  . telmisartan (MICARDIS) 80 MG tablet Take 1 tablet (80 mg total) by mouth daily. 30 tablet 0  . traMADol (ULTRAM) 50 MG tablet Take 1 tablet (50 mg total) by mouth 4 (four) times daily. 120 tablet 2  . vitamin C (ASCORBIC ACID) 500 MG tablet Take 1,000 mg by mouth daily.     No current facility-administered medications for this visit.    Social History   Social History  . Marital status: Legally Separated    Spouse name: N/A  . Number of children: 0  . Years of education: N/A   Occupational History  . 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 Smoking status: Never Smoker  . Smokeless tobacco: Never Used  . Alcohol use No  . Drug use: No  . Sexual activity: Yes   Other Topics Concern  . Not on file   Social History Narrative   Married. Education: [employer].    Family History  Problem Relation Age  of Onset  . COPD Mother   . Fibromyalgia Mother   . Heart disease Father   . Colon polyps Maternal Grandmother   . Diabetes Paternal Grandfather   . Heart disease Paternal Grandfather   . Colon polyps Maternal Aunt   . Colon polyps Unknown        ? father  . Diabetes Unknown        MGGF, MGGM  . Diabetes Maternal Uncle   . Aneurysm Maternal Aunt   . Stroke Sister   . Kidney disease Maternal Aunt   . Fibromyalgia Sister   . Fibromyalgia Unknown        cousin  . Colon cancer Neg Hx        Objective:    BP 117/76 (BP Location: Left Arm, Patient Position: Sitting, Cuff Size: Large)   Pulse 84   Temp 98 F (36.7 C) (Oral)   Resp 18   Ht 5' 2.99" (1.6 m)   Wt 267 lb 9.6 oz (121.4 kg)   SpO2 97%   BMI 47.42 kg/m  Physical Exam  Constitutional: She is oriented to person, place, and time. She appears well-developed and well-nourished. She appears ill. No distress.  HENT:  Head: Normocephalic and atraumatic.  Nose: Nose normal.  Mouth/Throat: Oropharynx is clear and moist.  Eyes: Pupils are equal, round, and reactive to light. Conjunctivae and EOM are normal.  Neck: Normal range of motion. Neck supple. Carotid bruit is not present. No thyromegaly present.  Cardiovascular: Normal rate, regular rhythm, normal heart sounds and intact distal pulses.  Exam reveals no gallop and no friction rub.   No murmur heard. Pulmonary/Chest: Effort normal and breath sounds normal. She has no wheezes. She has no rales.  Abdominal: Soft. Bowel sounds are normal. She exhibits no distension and no mass. There is generalized tenderness. There is no rebound, no guarding and no CVA tenderness. No hernia.  Lymphadenopathy:    She has no cervical adenopathy.  Neurological: She is alert and oriented to person, place, and time. No cranial nerve deficit.  Skin: Skin is warm and dry. No rash noted. She is not diaphoretic. No erythema. No pallor.  Psychiatric: She has a normal mood and affect. Her behavior  is normal.   Results for orders placed or performed  in visit on 04/29/17  POCT CBC  Result Value Ref Range   WBC 11.5 (A) 4.6 - 10.2 K/uL   Lymph, poc 1.8 0.6 - 3.4   POC LYMPH PERCENT 15.3 10 - 50 %L   MID (cbc) 1.0 (A) 0 - 0.9   POC MID % 8.9 0 - 12 %M   POC Granulocyte 8.7 (A) 2 - 6.9   Granulocyte percent 75.8 37 - 80 %G   RBC 4.19 4.04 - 5.48 M/uL   Hemoglobin 12.1 (A) 12.2 - 16.2 g/dL   HCT, POC 16.1 (A) 09.6 - 47.9 %   MCV 87.0 80 - 97 fL   MCH, POC 29.0 27 - 31.2 pg   MCHC 33.3 31.8 - 35.4 g/dL   RDW, POC 04.5 %   Platelet Count, POC 320 142 - 424 K/uL   MPV 8.2 0 - 99.8 fL  POCT urinalysis dipstick  Result Value Ref Range   Color, UA yellow yellow   Clarity, UA clear clear   Glucose, UA negative negative mg/dL   Bilirubin, UA negative negative   Ketones, POC UA negative negative mg/dL   Spec Grav, UA 4.098 1.191 - 1.025   Blood, UA small (A) negative   pH, UA 7.0 5.0 - 8.0   Protein Ur, POC negative negative mg/dL   Urobilinogen, UA 0.2 0.2 or 1.0 E.U./dL   Nitrite, UA Negative Negative   Leukocytes, UA Trace (A) Negative       Assessment & Plan:   1. Severe sepsis (HCC)   2. Enteritis   3. History of pancreatitis    -much improved from hospitalization with normalizing WBC count yet suffering with ongoing abdominal pain with worsening today; obtain Lipase,Amylase, CMET.   -Recommend decreasing to liquid diet for the next 48 hours; also encourage aggressive hydration with Gatorade or Powerade.  Recommend cranberry juice.   -work note provided. -RTC to see Dr. Clelia Croft in one week and sooner if worse -good support from parents who are caring for patient at home. -admission records reviewed in detail during visit. -repeat blood cx negative to date.  Obtain CMET.  Orders Placed This Encounter  Procedures  . Comprehensive metabolic panel  . Amylase  . Lipase  . POCT CBC  . POCT urinalysis dipstick   No orders of the defined types were placed in this  encounter.   No Follow-up on file.   Zakyra Kukuk Paulita Fujita, M.D. Primary Care at [facility] previously [facility] [address] [place], [place]  [address] [phone] phone [phone] fax

## 2017-04-29 NOTE — Patient Instructions (Signed)
We recommend that you schedule a mammogram for breast cancer screening. Typically, you do not need a referral to do this. Please contact a local imaging center to schedule your mammogram.  [facility] - [phone]  *ask for the Radiology Department [facility]) - [phone] or [phone]  [facility] - [phone] [facility] - [phone] [facility] - [phone]  *ask for the Radiology Department [facility] - [phone]  *ask for the Radiology Department [facility] - [phone]  *ask for the Mammography Department [facility] - [phone]     IF you received an x-ray today, you will receive an invoice from [facility]. Please contact [facility] at [phone] with questions or concerns regarding your invoice.   IF you received labwork today, you will receive an invoice from [facility]. Please contact [facility] at [phone] with questions or concerns regarding your invoice.   Our billing staff will not be able to assist you with questions regarding bills from these companies.  You will be contacted with the lab results as soon as they are available. The fastest way to get your results is to activate your My Chart account. Instructions are located on the last page of this paperwork. If you have not heard from us regarding the results in 2 weeks, please contact this office.      

## 2017-04-30 LAB — CULTURE, BLOOD (ROUTINE X 2)
CULTURE: NO GROWTH
Culture: NO GROWTH
SPECIAL REQUESTS: ADEQUATE
SPECIAL REQUESTS: ADEQUATE

## 2017-04-30 LAB — COMPREHENSIVE METABOLIC PANEL
A/G RATIO: 1.1 — AB (ref 1.2–2.2)
ALT: 31 IU/L (ref 0–32)
AST: 26 IU/L (ref 0–40)
Albumin: 3.3 g/dL — ABNORMAL LOW (ref 3.5–5.5)
Alkaline Phosphatase: 115 IU/L (ref 39–117)
BILIRUBIN TOTAL: 0.2 mg/dL (ref 0.0–1.2)
BUN/Creatinine Ratio: 12 (ref 9–23)
BUN: 8 mg/dL (ref 6–24)
CHLORIDE: 99 mmol/L (ref 96–106)
CO2: 27 mmol/L (ref 20–29)
Calcium: 9.2 mg/dL (ref 8.7–10.2)
Creatinine, Ser: 0.66 mg/dL (ref 0.57–1.00)
GFR calc Af Amer: 117 mL/min/{1.73_m2} (ref >59)
GFR calc non Af Amer: 101 mL/min/{1.73_m2} (ref >59)
Globulin, Total: 3 g/dL (ref 1.5–4.5)
Glucose: 113 mg/dL — ABNORMAL HIGH (ref 65–99)
POTASSIUM: 4.5 mmol/L (ref 3.5–5.2)
Sodium: 142 mmol/L (ref 134–144)
Total Protein: 6.3 g/dL (ref 6.0–8.5)

## 2017-04-30 LAB — AMYLASE: Amylase: 60 U/L (ref 31–124)

## 2017-04-30 LAB — LIPASE: LIPASE: 82 U/L — AB (ref 14–72)

## 2017-05-04 ENCOUNTER — Ambulatory Visit (INDEPENDENT_AMBULATORY_CARE_PROVIDER_SITE_OTHER): Payer: 59 | Admitting: Family Medicine

## 2017-05-04 ENCOUNTER — Encounter: Payer: Self-pay | Admitting: Family Medicine

## 2017-05-04 VITALS — BP 108/71 | HR 95 | Temp 97.8°F | Resp 17 | Ht 62.0 in | Wt 261.0 lb

## 2017-05-04 DIAGNOSIS — K8500 Idiopathic acute pancreatitis without necrosis or infection: Secondary | ICD-10-CM

## 2017-05-04 LAB — POCT CBC
GRANULOCYTE PERCENT: 66.6 % (ref 37–80)
HEMATOCRIT: 41.2 % (ref 37.7–47.9)
HEMOGLOBIN: 13.7 g/dL (ref 12.2–16.2)
Lymph, poc: 2.6 (ref 0.6–3.4)
MCH, POC: 28.8 pg (ref 27–31.2)
MCHC: 33.3 g/dL (ref 31.8–35.4)
MCV: 86.5 fL (ref 80–97)
MID (cbc): 0.4 (ref 0–0.9)
MPV: 8.4 fL (ref 0–99.8)
PLATELET COUNT, POC: 406 10*3/uL (ref 142–424)
POC GRANULOCYTE: 5.9 (ref 2–6.9)
POC LYMPH PERCENT: 29 %L (ref 10–50)
POC MID %: 4.4 %M (ref 0–12)
RBC: 4.76 M/uL (ref 4.04–5.48)
RDW, POC: 13.4 %
WBC: 8.9 10*3/uL (ref 4.6–10.2)

## 2017-05-04 MED ORDER — HYDROCODONE-ACETAMINOPHEN 5-325 MG PO TABS: 1.0000 | ORAL_TABLET | ORAL | 0 refills | Status: DC | PRN

## 2017-05-04 NOTE — Progress Notes (Signed)
By signing my name below, I, Mesha Guinyard, attest that this documentation has been prepared under the direction and in the presence of Norberto Sorenson, MD.  Electronically Signed: Arvilla Market, Medical Scribe. 05/04/17. 3:05 PM.  Subjective:    Patient ID: Gloria Carter, female    DOB: Dec 14, 1965, 51 y.o.   MRN: [id number]  HPI Chief Complaint  Patient presents with  . Follow-up    HPI Comments: Gloria Carter is a 51 y.o. female who presents to Primary Care at [facility] for hospital follow-up. She was hospitalized from 7/13-17/18 for severe sepsis, secondary to acute chronic pancreatitis. Her WBC count decreased from 41 to 16 throughout her hospitalization and sxs dramatically improved. She was treated with empiric vancomycin and zosyn while in the hospital. The repeat blood cultures were negative. She was discharge with 7 day course of augmentin. So last dose yesterday. She was started on creon as was her second episode of acute idiopathic pancreatitis last being April 2017. GI is Dr. Christella Hartigan at [facility]. CT did not show etiology and HIDA scan was nl in 2017. HCTZ was held during her hospitalization due to dehydration hyponatremia, magnesium and potasium were low as well and replace appropriately. She was continued on her telmisartan. She was seen by Dr. Katrinka Blazing in her follow-up 5 days prior and had noted her sxs had begun to reoccur that day. Her WBC count had continued to decrease to 11.5, however; lipase had bumped from 57 up to 82, so pt was advised to resume a liquid only diet x48 hours with aggressive hydration with close follow-up. Amylase had decreased from 140 to 60, potassium and sodium were nl with slightly low albumin, but metabolic profile and liver otherwise nl.  Pt has been out of work since 04/19/17, and would like to go back in 2 weeks. Pt currently feels tired with occasional nausea, and her stomach is fine "as long as she doesn't eat anything terrible". Pt felt better last  night, but it hurts to sit up for so long. Pt ate scrambled eggs (no oil and salt) and toast today. Pt discontinued creon since she was getting diarrhea while on it, and she noticed bright red rectal bleeding from wiping so much. Pt has 2-3 bowel movements a day and at least 1 of them is diarrhea. She noticed eating a baked potato the day before helps keep her stool dense. Pt has been eating mashed potatoes, stuffing, [place], and sugar cones since discharge. Pt has had small flares after eating a candy bar or spicy foods but it resolved on it's own. Pt doesn't know any food restrictions for her pancreatitis. Pt had a couple of salads 2 weeks before she got sick and she didn't know that was a trigger. Pt has been taking ultram, soma, and lyrica. Pt finished augmentin yesterday and she has 1 more day of monostat. Her urine is "okay" (not dark), and she can lay for hours without having to get up. The only time it's dark is when she lays for hours without drinking water. Pt has had a little cough but notes she hasn't been using her inhaler. Denies emesis, and chest pain.   Wt Readings from Last 3 Encounters:  05/04/17 261 lb (118.4 kg)  04/29/17 267 lb 9.6 oz (121.4 kg)  04/26/17 284 lb (128.8 kg)  Body mass index is 47.74 kg/m.   Patient Active Problem List   Diagnosis Date Noted  . Acute pancreatitis without infection or necrosis 04/26/2017  . Hypokalemia 04/26/2017  .  Hyponatremia 04/26/2017  . Severe sepsis (HCC) 04/26/2017  . Vitamin D deficiency 06/09/2015  . Degeneration of lumbar or lumbosacral intervertebral disc 05/22/2015  . Right lumbar radiculopathy 05/22/2015  . Prediabetes 09/08/2013  . Hypothyroidism 08/31/2013  . Asthma, chronic 08/31/2013  . Upper airway cough syndrome 08/31/2013  . Carpal tunnel syndrome 06/01/2013  . Wrist tendonitis 06/01/2013  . Encounter for medication monitoring 12/01/2012  . HTN (hypertension) 08/13/2012  . BMI 45.0-49.9, adult (HCC) 08/13/2012  .  Hyperlipidemia 08/13/2012  . IBS (irritable bowel syndrome) 08/13/2012  . ADD (attention deficit disorder) 02/07/2012  . GERD (gastroesophageal reflux disease) 02/07/2012  . Migraine 02/07/2012  . Dysphagia 02/07/2012  . Hernia of abdominal wall 02/07/2012  . RAD (reactive airway disease) 12/20/2011  . Fibromyalgia 12/20/2011  . Chronic pain 12/20/2011   Past Medical History:  Diagnosis Date  . ADD (attention deficit disorder)   . Allergy    ENVIRONMENTAL  . Arthritis    knees  . Asthma   . Depression   . Difficulty sleeping   . Dysphagia   . Fibromyalgia   . Fibromyalgia   . GERD (gastroesophageal reflux disease)   . Hernia of abdominal wall   . Hyperlipidemia   . Hypertension   . IBS (irritable bowel syndrome)   . Migraine   . Numbness and tingling in hands   . Obesity   . Swelling of both ankles   . Thyroid disease    Past Surgical History:  Procedure Laterality Date  . INSERTION OF MESH N/A 03/28/2014   Procedure: INSERTION OF MESH;  Surgeon: Velora Heckler, MD;  Location: [facility];  Service: General;  Laterality: N/A;  . TONSILECTOMY/ADENOIDECTOMY WITH MYRINGOTOMY    . VENTRAL HERNIA REPAIR N/A 03/28/2014   Procedure: LAPAROSCOPIC VENTRAL HERNIA REPAIR WITH MESH;  Surgeon: Velora Heckler, MD;  Location: [facility];  Service: General;  Laterality: N/A;  . WISDOM TOOTH EXTRACTION     Allergies  Allergen Reactions  . Soap     Any soaps with fragrance cause migraines  . Sulfa Antibiotics Nausea And Vomiting   Prior to Admission medications   Medication Sig Start Date End Date Taking? Authorizing Provider  albuterol (PROVENTIL HFA) 108 (90 BASE) MCG/ACT inhaler INHALE TWO PUFFS BY MOUTH EVERY SIX HOURS AS NEEDED Patient taking differently: Inhale 2 puffs into the lungs every 6 (six) hours as needed for wheezing or shortness of breath. INHALE TWO PUFFS BY MOUTH EVERY SIX HOURS AS NEEDED 08/21/15  Yes Sherren Mocha, MD  amphetamine-dextroamphetamine (ADDERALL) 10 MG tablet  Take 1 tablet (10 mg total) by mouth 2 (two) times daily. 01/17/17  Yes Sherren Mocha, MD  azelastine (OPTIVAR) 0.05 % ophthalmic solution PLACE TWO DRIPS INTO BOTH EYES TWICE DAILY Patient taking differently: PLACE TWO DRIPS INTO BOTH EYES TWICE DAILY FOR DRY EYES 09/04/16  Yes Sherren Mocha, MD  B Complex-C-Folic Acid (B-COMPLEX BALANCED PO) Take 1 tablet by mouth daily.    Yes [provider]  calcium carbonate (OS-CAL) 600 MG TABS Take 600 mg by mouth daily.   Yes [provider]  carisoprodol (SOMA) 350 MG tablet Take 1 tablet (350 mg total) by mouth 4 (four) times daily as needed for muscle spasms. 01/17/17  Yes Sherren Mocha, MD  cetirizine (ZYRTEC) 10 MG tablet Take 10 mg by mouth daily as needed for allergies.    Yes [provider]  cholecalciferol (VITAMIN D) 400 UNITS TABS Take 400 Units by mouth daily.  Yes [provider]  citalopram (CELEXA) 20 MG tablet TAKE 1 TABLET (20 MG TOTAL) BY MOUTH DAILY. 01/03/17  Yes Jeffery, Chelle, PA-C  dicyclomine (BENTYL) 20 MG tablet Take 1 tablet (20 mg total) by mouth 3 (three) times daily before meals. As needed for bowel cramping 06/24/16  Yes Sherren MochaShaw, Jiali Linney N, MD  esomeprazole (NEXIUM) 40 MG capsule TAKE ONE CAPSULE BY MOUTH ONE TIME DAILY Patient taking differently: TAKE ONE CAPSULE BY MOUTH ONE TIME DAILY FOR INDIGESTION 03/01/17  Yes Sherren MochaShaw, Javares Kaufhold N, MD  Fluticasone-Salmeterol (ADVAIR DISKUS) 500-50 MCG/DOSE AEPB Inhale 1 puff into the lungs 2 (two) times daily. 01/17/17  Yes Sherren MochaShaw, Hulda Reddix N, MD  montelukast (SINGULAIR) 10 MG tablet Take 1 tablet (10 mg total) by mouth every morning. 06/24/16  Yes Sherren MochaShaw, Myrtle Barnhard N, MD  Multiple Vitamin (MULTIVITAMIN) tablet Take 1 tablet by mouth daily.   Yes [provider]  ondansetron (ZOFRAN) 4 MG tablet Take 1 tablet (4 mg total) by mouth every 6 (six) hours. Patient taking differently: Take 4 mg by mouth every 8 (eight) hours as needed for nausea or vomiting.  06/24/16  Yes Sherren MochaShaw, Gerald Honea N, MD    Polyethyl Glycol-Propyl Glycol (SYSTANE) 0.4-0.3 % SOLN Apply 1 drop to eye 3 (three) times daily as needed (dry eyes).   Yes [provider]  pregabalin (LYRICA) 150 MG capsule Take 1 capsule (150 mg total) by mouth 3 (three) times daily. Patient taking differently: Take 150 mg by mouth 2 (two) times daily.  10/18/16  Yes Sherren MochaShaw, Mersades Barbaro N, MD  ranitidine (ZANTAC) 150 MG tablet Take 150 mg by mouth 2 (two) times daily as needed for heartburn.   Yes [provider]  SUMAtriptan (IMITREX) 100 MG tablet Take 1 tablet (100 mg total) by mouth every 2 (two) hours as needed for migraine. No more than 2 doses in 24 hours. 06/24/16  Yes Sherren MochaShaw, Terrika Zuver N, MD  SYNTHROID 75 MCG tablet TAKE 1 TABLET (75 MCG TOTAL) BY MOUTH DAILY BEFORE BREAKFAST. 01/28/17  Yes Sherren MochaShaw, Kimani Hovis N, MD  telmisartan (MICARDIS) 80 MG tablet Take 1 tablet (80 mg total) by mouth daily. 04/26/17  Yes Dhungel, Nishant, MD  traMADol (ULTRAM) 50 MG tablet Take 1 tablet (50 mg total) by mouth 4 (four) times daily. 01/17/17  Yes Sherren MochaShaw, Kimia Finan N, MD  vitamin C (ASCORBIC ACID) 500 MG tablet Take 1,000 mg by mouth daily.   Yes [provider]   Social History   Social History  . Marital status: Legally Separated    Spouse name: N/A  . Number of children: 0  . Years of education: N/A   Occupational History  . [place] [employer]   Social History Main Topics  . Smoking status: Never Smoker  . Smokeless tobacco: Never Used  . Alcohol use No  . Drug use: No  . Sexual activity: Yes   Other Topics Concern  . Not on file   Social History Narrative   Married. Education: [employer].    Review of Systems  Constitutional: Positive for fatigue.  Cardiovascular: Negative for chest pain.  Gastrointestinal: Positive for abdominal pain, anal bleeding and nausea. Negative for blood in stool and vomiting.   Objective:  Physical Exam  Constitutional: She appears well-developed and well-nourished. No distress.  HENT:  Head:  Normocephalic and atraumatic.  Eyes: Conjunctivae are normal.  Neck: Neck supple.  Cardiovascular: Normal rate, regular rhythm and normal heart sounds.  Exam reveals no gallop and no friction rub.   No  murmur heard. Pulmonary/Chest: Effort normal and breath sounds normal. No respiratory distress. She has no wheezes. She has no rales.  Abdominal: Soft. Bowel sounds are normal. She exhibits no distension. There is no tenderness.  Neurological: She is alert.  Skin: Skin is warm and dry.  Psychiatric: She has a normal mood and affect. Her behavior is normal.  Nursing note and vitals reviewed.  Vitals:   05/04/17 1458  BP: 108/71  Pulse: 95  Resp: 17  Temp: 97.8 F (36.6 C)  TempSrc: Oral  SpO2: 98%  Weight: 261 lb (118.4 kg)  Height: 5\' 2"  (1.575 m)  Body mass index is 47.74 kg/m.   Wt Readings from Last 3 Encounters:  05/04/17 261 lb (118.4 kg)  04/29/17 267 lb 9.6 oz (121.4 kg)  04/26/17 284 lb (128.8 kg)    Results for orders placed or performed in visit on 05/04/17  POCT CBC  Result Value Ref Range   WBC 8.9 4.6 - 10.2 K/uL   Lymph, poc 2.6 0.6 - 3.4   POC LYMPH PERCENT 29.0 10 - 50 %L   MID (cbc) 0.4 0 - 0.9   POC MID % 4.4 0 - 12 %M   POC Granulocyte 5.9 2 - 6.9   Granulocyte percent 66.6 37 - 80 %G   RBC 4.76 4.04 - 5.48 M/uL   Hemoglobin 13.7 12.2 - 16.2 g/dL   HCT, POC 13.041.2 86.537.7 - 47.9 %   MCV 86.5 80 - 97 fL   MCH, POC 28.8 27 - 31.2 pg   MCHC 33.3 31.8 - 35.4 g/dL   RDW, POC 78.413.4 %   Platelet Count, POC 406 142 - 424 K/uL   MPV 8.4 0 - 99.8 fL   Assessment & Plan:   1. Idiopathic acute pancreatitis without infection or necrosis     Orders Placed This Encounter  Procedures  . Comprehensive metabolic panel  . Lipase  . Ambulatory referral to Gastroenterology    Referral Priority:   Routine    Referral Type:   Consultation    Referral Reason:   Specialty Services Required    Referred to Provider:   Rachael FeeJacobs, Daniel P, MD    Number of Visits  Requested:   1  . Amb ref to Medical Nutrition Therapy-MNT    Referral Priority:   Routine    Referral Type:   Consultation    Referral Reason:   Specialty Services Required    Requested Specialty:   Nutrition    Number of Visits Requested:   1  . POCT CBC    Meds ordered this encounter  Medications  . HYDROcodone-acetaminophen (NORCO/VICODIN) 5-325 MG tablet    Sig: Take 1-2 tablets by mouth every 4 (four) hours as needed for moderate pain.    Dispense:  60 tablet    Refill:  0    I personally performed the services described in this documentation, which was scribed in my presence. The recorded information has been reviewed and considered, and addended by me as needed.   Norberto SorensonEva Benno Brensinger, M.D.  [facility]  [facility] [address] [place], [place] [address] [phone] phone [phone] fax  05/06/17 11:50 PM

## 2017-05-04 NOTE — Patient Instructions (Addendum)
IF you received an x-ray today, you will receive an invoice from [facility]. Please contact [facility] at [phone] with questions or concerns regarding your invoice.   IF you received labwork today, you will receive an invoice from [facility]. Please contact [facility] at [phone] with questions or concerns regarding your invoice.   Our billing staff will not be able to assist you with questions regarding bills from these companies.  You will be contacted with the lab results as soon as they are available. The fastest way to get your results is to activate your My Chart account. Instructions are located on the last page of this paperwork. If you have not heard from us regarding the results in 2 weeks, please contact this office.      Chronic Pancreatitis Chronic pancreatitis is long-lasting inflammation and scarring of the pancreas. The pancreas is a gland that is located behind the stomach. It produces enzymes that help to digest food. The pancreas also releases the hormones glucagon and insulin, which help to regulate blood sugar. Damage to the pancreas may affect digestion, cause pain in the upper abdomen and back, and cause diabetes. Inflammation can also irritate other abdominal organs near the pancreas. At the very beginning, pancreatitis may be sudden (acute). If acute pancreatitis is not caught in time or treated effectively, or if you have several or prolonged episodes of acute pancreatitis, then the condition can turn into chronic pancreatitis. What are the causes? The most common cause of this condition is alcohol abuse. Other causes include:  High levels of triglycerides in the blood (hypertriglyceridemia).  Gallstones or other conditions that can block the tube that drains the pancreas (pancreatic duct).  Pancreatic cancer.  Cystic fibrosis.  Too much calcium in the blood (hypercalcemia), which may be caused by an overactive parathyroid gland  (hyperparathyroidism).  Certain medicines.  Injury to the pancreas.  Infection.  Autoimmune pancreatitis. This is when the body's disease-fighting (immune) system attacks the pancreas.  Genes that are passed along from parent to child (inherited).  In some cases, the cause may not be known. What increases the risk? This condition is more likely to develop in:  Men.  People who are 5840-53 years old.  What are the signs or symptoms? Symptoms of this condition may include:  Abdominal pain. Pain may also be felt in the upper back and may get worse after eating.  Nausea and vomiting.  Fever.  Weight loss.  A change in the color and consistency of bowel movements, such as diarrhea.  How is this diagnosed? This condition is diagnosed based on your symptoms, your medical history, and a physical exam. You may have tests, such as:  Blood tests.  Stool samples.  Biopsy of the pancreas. This is the removal of a small amount of pancreas tissue to be tested in a lab.  Imaging studies, such as: ? X-rays. ? CT scan. ? MRI. ? Ultrasound.  How is this treated? The goal of treatment is to help relieve symptoms and to prevent complications from occurring. Treatment focuses on:  Resting the pancreas. You may need to stop eating and drinking for a few days while in the hospital to give your pancreas time to recover. During this time, you will be given IV fluids to keep you hydrated.  Controlling pain. You may be given pain medicines by mouth (orally) or as injections.  Improving digestion. You may be given: ? Medicines to help balance your enzymes. ? Vitamin supplements. ?  A specific diet to follow. If you are given a diet, you may work with a specialist (dietitian).  Preventing diabetes. You may need insulin injections.  You may have surgery to:  Clear the pancreatic ducts of any blockages, such as gallstones.  Remove any fluid or damaged tissue from the  pancreas.  Follow these instructions at home:  Take over-the-counter and prescription medicines only as told by your health care provider. This includes any vitamin supplements.  Do not drive or operate heavy machinery while taking prescription pain medicines.  Drink enough fluid to keep your urine clear or pale yellow.  Do not drink alcohol. If you need help quitting, ask your health care provider.  Do not use any tobacco products, such as cigarettes, chewing tobacco, and e-cigarettes. If you need help quitting, ask your health care provider.  Follow a diet as told by your health care provider or dietitian, if this applies. This may include: ? Limiting how much fat you eat. ? Eating smaller meals more often. ? Avoiding caffeine.  Keep all follow-up visits as told by your health care provider. This is important. Contact a health care provider if:  You have pain that does not get better with medicine.  You have a fever. Get help right away if:  Your pain suddenly gets worse.  You have sudden abdominal swelling.  You start to vomit often or you vomit blood.  You have diarrhea that does not go away.  You have blood in your stool. This information is not intended to replace advice given to you by your health care provider. Make sure you discuss any questions you have with your health care provider. Document Released: 10/24/2015 Document Revised: 03/04/2016 Document Reviewed: 09/04/2014 Elsevier Interactive Patient Education  2018 Elsevier Inc.  Low-Fat Diet for Pancreatitis or Gallbladder Conditions A low-fat diet can be helpful if you have pancreatitis or a gallbladder condition. With these conditions, your pancreas and gallbladder have trouble digesting fats. A healthy eating plan with less fat will help rest your pancreas and gallbladder and reduce your symptoms. What do I need to know about this diet?  Eat a low-fat diet. ? Reduce your fat intake to less than 20-30% of  your total daily calories. This is less than 50-60 g of fat per day. ? Remember that you need some fat in your diet. Ask your dietician what your daily goal should be. ? Choose nonfat and low-fat healthy foods. Look for the words "nonfat," "low fat," or "fat free." ? As a guide, look on the label and choose foods with less than 3 g of fat per serving. Eat only one serving.  Avoid alcohol.  Do not smoke. If you need help quitting, talk with your health care provider.  Eat small frequent meals instead of three large heavy meals. What foods can I eat? Grains Include healthy grains and starches such as potatoes, wheat bread, fiber-rich cereal, and brown rice. Choose whole grain options whenever possible. In adults, whole grains should account for 45-65% of your daily calories. Fruits and Vegetables Eat plenty of fruits and vegetables. Fresh fruits and vegetables add fiber to your diet. Meats and Other Protein Sources Eat lean meat such as chicken and pork. Trim any fat off of meat before cooking it. Eggs, fish, and beans are other sources of protein. In adults, these foods should account for 10-35% of your daily calories. Dairy Choose low-fat milk and dairy options. Dairy includes fat and protein, as well as calcium. Fats  and Oils Limit high-fat foods such as fried foods, sweets, baked goods, sugary drinks. Other Creamy sauces and condiments, such as mayonnaise, can add extra fat. Think about whether or not you need to use them, or use smaller amounts or low fat options. What foods are not recommended?  High fat foods, such as: ? [employer]. ? Ice cream. ? [place] toast. ? Sweet rolls. ? Pizza. ? Cheese bread. ? Foods covered with batter, butter, creamy sauces, or cheese. ? Fried foods. ? Sugary drinks and desserts.  Foods that cause gas or bloating This information is not intended to replace advice given to you by your health care provider. Make sure you discuss any questions you  have with your health care provider. Document Released: 10/02/2013 Document Revised: 03/04/2016 Document Reviewed: 09/10/2013 Elsevier Interactive Patient Education  2017 [employer].

## 2017-05-05 LAB — COMPREHENSIVE METABOLIC PANEL
A/G RATIO: 1.2 (ref 1.2–2.2)
ALK PHOS: 109 IU/L (ref 39–117)
ALT: 21 IU/L (ref 0–32)
AST: 20 IU/L (ref 0–40)
Albumin: 4 g/dL (ref 3.5–5.5)
BILIRUBIN TOTAL: 0.3 mg/dL (ref 0.0–1.2)
BUN/Creatinine Ratio: 19 (ref 9–23)
BUN: 15 mg/dL (ref 6–24)
CALCIUM: 9.7 mg/dL (ref 8.7–10.2)
CO2: 23 mmol/L (ref 20–29)
Chloride: 99 mmol/L (ref 96–106)
Creatinine, Ser: 0.78 mg/dL (ref 0.57–1.00)
GFR calc Af Amer: 100 mL/min/{1.73_m2} (ref >59)
GFR, EST NON AFRICAN AMERICAN: 87 mL/min/{1.73_m2} (ref >59)
GLOBULIN, TOTAL: 3.3 g/dL (ref 1.5–4.5)
Glucose: 134 mg/dL — ABNORMAL HIGH (ref 65–99)
POTASSIUM: 4.4 mmol/L (ref 3.5–5.2)
SODIUM: 138 mmol/L (ref 134–144)
Total Protein: 7.3 g/dL (ref 6.0–8.5)

## 2017-05-05 LAB — LIPASE: Lipase: 78 U/L — ABNORMAL HIGH (ref 14–72)

## 2017-05-06 ENCOUNTER — Telehealth: Payer: Self-pay | Admitting: Family Medicine

## 2017-05-06 NOTE — Telephone Encounter (Signed)
[facility] be [facility] for pt CT Abdomen Pelvis W/ Contrast. The [facility] but the pt no showed and it was then sent to [facility]. They are unable to schedule without the location being routed to them. Please place new order and route [facility] as the preferred imaging location. Thanks!

## 2017-05-07 NOTE — Telephone Encounter (Signed)
[facility]; please advise [facility] to cancel request.

## 2017-05-09 NOTE — Telephone Encounter (Signed)
[facility] advised

## 2017-05-16 ENCOUNTER — Other Ambulatory Visit: Payer: Self-pay | Admitting: Family Medicine

## 2017-05-16 ENCOUNTER — Ambulatory Visit (INDEPENDENT_AMBULATORY_CARE_PROVIDER_SITE_OTHER): Payer: 59 | Admitting: Physician Assistant

## 2017-05-16 ENCOUNTER — Encounter: Payer: Self-pay | Admitting: Physician Assistant

## 2017-05-16 VITALS — BP 139/86 | HR 109 | Temp 97.5°F | Resp 18 | Ht 62.0 in | Wt 264.0 lb

## 2017-05-16 DIAGNOSIS — D72825 Bandemia: Secondary | ICD-10-CM

## 2017-05-16 DIAGNOSIS — K8500 Idiopathic acute pancreatitis without necrosis or infection: Secondary | ICD-10-CM | POA: Diagnosis not present

## 2017-05-16 MED ORDER — OXYCODONE HCL 5 MG PO TABS: 5.0000 mg | ORAL_TABLET | ORAL | 0 refills | Status: DC | PRN

## 2017-05-16 NOTE — Progress Notes (Signed)
PRIMARY CARE AT [facility] [address], [place] [address] [phone]  Date:  05/16/2017   Name:  Gloria Carter   DOB:  September 25, 1966   MRN:  [id number]  PCP:  Sherren Mocha, MD    History of Present Illness:  Gloria Carter is a 51 y.o. female patient who presents to PCP with  Chief Complaint  Patient presents with  . Follow-up    pancreatitis     --patient is here for follow up of pancreatitis. --patient 5 weeks ago with 4 day hospitalization due to acute pancreatitis, and sepsis.  Follow up 3 days after.  Following trending lower pancreatitis, and wbc.  Patient reports that the last 2 days were more painful then her prior improvement.  Abdominal pain, and nausea.  She states it is more prominent at the upper abdomen and generalized.  Her normal BM.  No fever.  No emesis.    Patient Active Problem List   Diagnosis Date Noted  . Acute pancreatitis without infection or necrosis 04/26/2017  . Hypokalemia 04/26/2017  . Hyponatremia 04/26/2017  . Severe sepsis (HCC) 04/26/2017  . Vitamin D deficiency 06/09/2015  . Degeneration of lumbar or lumbosacral intervertebral disc 05/22/2015  . Right lumbar radiculopathy 05/22/2015  . Prediabetes 09/08/2013  . Hypothyroidism 08/31/2013  . Asthma, chronic 08/31/2013  . Upper airway cough syndrome 08/31/2013  . Carpal tunnel syndrome 06/01/2013  . Wrist tendonitis 06/01/2013  . Encounter for medication monitoring 12/01/2012  . HTN (hypertension) 08/13/2012  . BMI 45.0-49.9, adult (HCC) 08/13/2012  . Hyperlipidemia 08/13/2012  . IBS (irritable bowel syndrome) 08/13/2012  . ADD (attention deficit disorder) 02/07/2012  . Migraine 02/07/2012  . Dysphagia 02/07/2012  . Hernia of abdominal wall 02/07/2012  . RAD (reactive airway disease) 12/20/2011  . Fibromyalgia 12/20/2011  . Chronic pain 12/20/2011    Past Medical History:  Diagnosis Date  . ADD (attention deficit disorder)   . Allergy    ENVIRONMENTAL  . Arthritis    knees  . Asthma   . Depression   . Difficulty sleeping   . Dysphagia   . Fibromyalgia   . Fibromyalgia   . GERD (gastroesophageal reflux disease)   . Hernia of abdominal wall   . Hyperlipidemia   . Hypertension   . IBS (irritable bowel syndrome)   . Migraine   . Numbness and tingling in hands   . Obesity   . Swelling of both ankles   . Thyroid disease     Past Surgical History:  Procedure Laterality Date  . INSERTION OF MESH N/A 03/28/2014   Procedure: INSERTION OF MESH;  Surgeon: Velora Heckler, MD;  Location: [facility];  Service: General;  Laterality: N/A;  . TONSILECTOMY/ADENOIDECTOMY WITH MYRINGOTOMY    . VENTRAL HERNIA REPAIR N/A 03/28/2014   Procedure: LAPAROSCOPIC VENTRAL HERNIA REPAIR WITH MESH;  Surgeon: Velora Heckler, MD;  Location: [facility];  Service: General;  Laterality: N/A;  . WISDOM TOOTH EXTRACTION      Social History  Substance Use Topics  . Smoking status: Never Smoker  . Smokeless tobacco: Never Used  . Alcohol use No    Family History  Problem Relation Age of Onset  . COPD Mother   . Fibromyalgia Mother   . Heart disease Father   . Colon polyps Maternal Grandmother   . Diabetes Paternal Grandfather   . Heart disease Paternal Grandfather   . Colon polyps Maternal Aunt   . Colon polyps Unknown        ?  father  . Diabetes Unknown        MGGF, MGGM  . Diabetes Maternal Uncle   . Aneurysm Maternal Aunt   . Stroke Sister   . Kidney disease Maternal Aunt   . Fibromyalgia Sister   . Fibromyalgia Unknown        cousin  . Colon cancer Neg Hx     Allergies  Allergen Reactions  . Soap     Any soaps with fragrance cause migraines  . Sulfa Antibiotics Nausea And Vomiting    Medication list has been reviewed and updated.  Current Outpatient Prescriptions on File Prior to Visit  Medication Sig Dispense Refill  . albuterol (PROVENTIL HFA) 108 (90 BASE) MCG/ACT inhaler INHALE TWO PUFFS BY MOUTH EVERY SIX HOURS AS NEEDED (Patient taking differently:  Inhale 2 puffs into the lungs every 6 (six) hours as needed for wheezing or shortness of breath. INHALE TWO PUFFS BY MOUTH EVERY SIX HOURS AS NEEDED) 18 Inhaler 11  . amphetamine-dextroamphetamine (ADDERALL) 10 MG tablet Take 1 tablet (10 mg total) by mouth 2 (two) times daily. 60 tablet 0  . azelastine (OPTIVAR) 0.05 % ophthalmic solution PLACE TWO DRIPS INTO BOTH EYES TWICE DAILY (Patient taking differently: PLACE TWO DRIPS INTO BOTH EYES TWICE DAILY FOR DRY EYES) 6 mL 5  . B Complex-C-Folic Acid (B-COMPLEX BALANCED PO) Take 1 tablet by mouth daily.     . calcium carbonate (OS-CAL) 600 MG TABS Take 600 mg by mouth daily.    . carisoprodol (SOMA) 350 MG tablet Take 1 tablet (350 mg total) by mouth 4 (four) times daily as needed for muscle spasms. 120 tablet 2  . cetirizine (ZYRTEC) 10 MG tablet Take 10 mg by mouth daily as needed for allergies.     . cholecalciferol (VITAMIN D) 400 UNITS TABS Take 400 Units by mouth daily.    . citalopram (CELEXA) 20 MG tablet TAKE 1 TABLET (20 MG TOTAL) BY MOUTH DAILY. 90 tablet 1  . dicyclomine (BENTYL) 20 MG tablet Take 1 tablet (20 mg total) by mouth 3 (three) times daily before meals. As needed for bowel cramping 90 tablet 0  . esomeprazole (NEXIUM) 40 MG capsule TAKE ONE CAPSULE BY MOUTH ONE TIME DAILY (Patient taking differently: TAKE ONE CAPSULE BY MOUTH ONE TIME DAILY FOR INDIGESTION) 90 capsule 0  . Fluticasone-Salmeterol (ADVAIR DISKUS) 500-50 MCG/DOSE AEPB Inhale 1 puff into the lungs 2 (two) times daily. 60 each 3  . HYDROcodone-acetaminophen (NORCO/VICODIN) 5-325 MG tablet Take 1-2 tablets by mouth every 4 (four) hours as needed for moderate pain. 60 tablet 0  . montelukast (SINGULAIR) 10 MG tablet Take 1 tablet (10 mg total) by mouth every morning. 90 tablet 3  . Multiple Vitamin (MULTIVITAMIN) tablet Take 1 tablet by mouth daily.    . ondansetron (ZOFRAN) 4 MG tablet Take 1 tablet (4 mg total) by mouth every 6 (six) hours. (Patient taking differently:  Take 4 mg by mouth every 8 (eight) hours as needed for nausea or vomiting. ) 60 tablet 1  . Polyethyl Glycol-Propyl Glycol (SYSTANE) 0.4-0.3 % SOLN Apply 1 drop to eye 3 (three) times daily as needed (dry eyes).    . pregabalin (LYRICA) 150 MG capsule Take 1 capsule (150 mg total) by mouth 3 (three) times daily. (Patient taking differently: Take 150 mg by mouth 2 (two) times daily. ) 270 capsule 1  . ranitidine (ZANTAC) 150 MG tablet Take 150 mg by mouth 2 (two) times daily as needed for heartburn.    Marland Kitchen  SUMAtriptan (IMITREX) 100 MG tablet Take 1 tablet (100 mg total) by mouth every 2 (two) hours as needed for migraine. No more than 2 doses in 24 hours. 8 tablet 3  . SYNTHROID 75 MCG tablet TAKE 1 TABLET (75 MCG TOTAL) BY MOUTH DAILY BEFORE BREAKFAST. 90 tablet 0  . telmisartan (MICARDIS) 80 MG tablet Take 1 tablet (80 mg total) by mouth daily. 30 tablet 0  . vitamin C (ASCORBIC ACID) 500 MG tablet Take 1,000 mg by mouth daily.    . [DISCONTINUED] hyoscyamine (CYSTOSPAZ) 0.15 MG tablet Take 0.15 mg by mouth every 4 (four) hours as needed.     No current facility-administered medications on file prior to visit.     ROS ROS otherwise unremarkable unless listed above.  Physical Examination: BP 139/86   Pulse (!) 109   Temp (!) 97.5 F (36.4 C) (Oral)   Resp 18   Ht 5\' 2"  (1.575 m)   Wt 264 lb (119.7 kg)   BMI 48.29 kg/m  Ideal Body Weight: Weight in (lb) to have BMI = 25: 136.4  Physical Exam  Constitutional: She is oriented to person, place, and time. She appears well-developed and well-nourished. No distress.  HENT:  Head: Normocephalic and atraumatic.  Right Ear: External ear normal.  Left Ear: External ear normal.  Eyes: Pupils are equal, round, and reactive to light. Conjunctivae and EOM are normal.  Cardiovascular: Normal rate.   Pulmonary/Chest: Effort normal. No respiratory distress.  Abdominal: Soft. Normal appearance. There is generalized tenderness.  Neurological: She is  alert and oriented to person, place, and time.  Skin: She is not diaphoretic.  Psychiatric: She has a normal mood and affect. Her behavior is normal.    Wt Readings from Last 3 Encounters:  05/16/17 264 lb (119.7 kg)  05/04/17 261 lb (118.4 kg)  04/29/17 267 lb 9.6 oz (121.4 kg)     Assessment and Plan: Gloria Carter is a 51 y.o. female who is here today for follow up and abdominal Will recheck lipase.  Advised to return for lipid panel.  Advised of lower triglyceride diet, and possibility of culprit.   Idiopathic acute pancreatitis without infection or necrosis - Plan: CBC, Lipase, Lipid panel  Bandemia - Plan: CBC, Lipase, Lipid panel  Trena PlattStephanie English, PA-C [facility] 8/12/20185:38 PM

## 2017-05-16 NOTE — Patient Instructions (Addendum)
Please return for your lipid panel tomorrow.  Make sure you are fasting.   We will try the oxycodone.  Stop the hydrocodone. Continue drinking the water.  You may try not eating the rest of the day to tomorrow, to see if this may relieve abdominal pain.    IF you received an x-ray today, you will receive an [facility] from [facility]. Please contact [facility] at [phone] with questions or concerns regarding your invoice.   IF you received labwork today, you will receive an invoice from [facility]. Please contact [facility] at [phone] with questions or concerns regarding your invoice.   Our billing staff will not be able to assist you with questions regarding bills from these companies.  You will be contacted with the lab results as soon as they are available. The fastest way to get your results is to activate your My Chart account. Instructions are located on the last page of this paperwork. If you have not heard from us regarding the results in 2 weeks, please contact this office.

## 2017-05-17 ENCOUNTER — Ambulatory Visit: Payer: 59 | Admitting: Physician Assistant

## 2017-05-17 DIAGNOSIS — D72825 Bandemia: Secondary | ICD-10-CM

## 2017-05-17 DIAGNOSIS — R1084 Generalized abdominal pain: Secondary | ICD-10-CM | POA: Diagnosis not present

## 2017-05-17 DIAGNOSIS — K8500 Idiopathic acute pancreatitis without necrosis or infection: Secondary | ICD-10-CM

## 2017-05-17 LAB — CBC
Hematocrit: 35.6 % (ref 34.0–46.6)
Hemoglobin: 11.9 g/dL (ref 11.1–15.9)
MCH: 28.7 pg (ref 26.6–33.0)
MCHC: 33.4 g/dL (ref 31.5–35.7)
MCV: 86 fL (ref 79–97)
PLATELETS: 193 10*3/uL (ref 150–379)
RBC: 4.15 x10E6/uL (ref 3.77–5.28)
RDW: 13.6 % (ref 12.3–15.4)
WBC: 5.6 10*3/uL (ref 3.4–10.8)

## 2017-05-17 LAB — LIPASE: LIPASE: 31 U/L (ref 14–72)

## 2017-05-17 NOTE — Progress Notes (Signed)
I did not personally examine this patient.  

## 2017-05-18 ENCOUNTER — Encounter: Payer: Self-pay | Admitting: Family Medicine

## 2017-05-18 ENCOUNTER — Ambulatory Visit (INDEPENDENT_AMBULATORY_CARE_PROVIDER_SITE_OTHER): Payer: 59 | Admitting: Family Medicine

## 2017-05-18 VITALS — BP 135/80 | HR 64 | Temp 98.4°F | Resp 18 | Ht 62.0 in | Wt 264.0 lb

## 2017-05-18 DIAGNOSIS — K8500 Idiopathic acute pancreatitis without necrosis or infection: Secondary | ICD-10-CM

## 2017-05-18 DIAGNOSIS — F988 Other specified behavioral and emotional disorders with onset usually occurring in childhood and adolescence: Secondary | ICD-10-CM

## 2017-05-18 DIAGNOSIS — G43809 Other migraine, not intractable, without status migrainosus: Secondary | ICD-10-CM

## 2017-05-18 DIAGNOSIS — K589 Irritable bowel syndrome without diarrhea: Secondary | ICD-10-CM | POA: Diagnosis not present

## 2017-05-18 DIAGNOSIS — M797 Fibromyalgia: Secondary | ICD-10-CM

## 2017-05-18 DIAGNOSIS — R531 Weakness: Secondary | ICD-10-CM

## 2017-05-18 DIAGNOSIS — E8881 Metabolic syndrome: Secondary | ICD-10-CM | POA: Diagnosis not present

## 2017-05-18 DIAGNOSIS — Z6841 Body Mass Index (BMI) 40.0 and over, adult: Secondary | ICD-10-CM

## 2017-05-18 DIAGNOSIS — E785 Hyperlipidemia, unspecified: Secondary | ICD-10-CM

## 2017-05-18 DIAGNOSIS — R7303 Prediabetes: Secondary | ICD-10-CM | POA: Diagnosis not present

## 2017-05-18 DIAGNOSIS — G894 Chronic pain syndrome: Secondary | ICD-10-CM

## 2017-05-18 LAB — LIPID PANEL
Chol/HDL Ratio: 5.7 ratio — ABNORMAL HIGH (ref 0.0–4.4)
Cholesterol, Total: 216 mg/dL — ABNORMAL HIGH (ref 100–199)
HDL: 38 mg/dL — ABNORMAL LOW (ref >39)
LDL Calculated: 127 mg/dL — ABNORMAL HIGH (ref 0–99)
Triglycerides: 253 mg/dL — ABNORMAL HIGH (ref 0–149)
VLDL CHOLESTEROL CAL: 51 mg/dL — AB (ref 5–40)

## 2017-05-18 MED ORDER — ESOMEPRAZOLE MAGNESIUM 40 MG PO CPDR: 40.0000 mg | DELAYED_RELEASE_CAPSULE | Freq: Every day | ORAL | 0 refills | Status: DC

## 2017-05-18 MED ORDER — PREGABALIN 150 MG PO CAPS: 150.0000 mg | ORAL_CAPSULE | Freq: Two times a day (BID) | ORAL | 1 refills | Status: DC

## 2017-05-18 MED ORDER — AMPHETAMINE-DEXTROAMPHETAMINE 20 MG PO TABS: 20.0000 mg | ORAL_TABLET | Freq: Two times a day (BID) | ORAL | 0 refills | Status: DC

## 2017-05-18 MED ORDER — FLUTICASONE-SALMETEROL 500-50 MCG/DOSE IN AEPB: 1.0000 | INHALATION_SPRAY | Freq: Two times a day (BID) | RESPIRATORY_TRACT | 1 refills | Status: DC

## 2017-05-18 MED ORDER — LEVOTHYROXINE SODIUM 75 MCG PO TABS: 75.0000 ug | ORAL_TABLET | Freq: Every day | ORAL | 0 refills | Status: DC

## 2017-05-18 MED ORDER — SUMATRIPTAN SUCCINATE 100 MG PO TABS: 100.0000 mg | ORAL_TABLET | ORAL | 3 refills | Status: DC | PRN

## 2017-05-18 MED ORDER — CARISOPRODOL 350 MG PO TABS: 350.0000 mg | ORAL_TABLET | Freq: Four times a day (QID) | ORAL | 2 refills | Status: DC | PRN

## 2017-05-18 MED ORDER — TELMISARTAN 80 MG PO TABS: 80.0000 mg | ORAL_TABLET | Freq: Every day | ORAL | 0 refills | Status: DC

## 2017-05-18 NOTE — Progress Notes (Signed)
Subjective:    Patient ID: Gloria Carter, female    DOB: 10/02/1966, 51 y.o.   MRN: [id number] Chief Complaint  Patient presents with  . Pancretitis  . Follow-up    HPI Pt presents for f/u on pancreatitis for which she was hosp 7/13-7/17. She is here w/ her parents who are caring for her through this. She had blood work done 2 days ago at a recheck w/ my colleague and her cholesterol done yesterday afternoon while she had been fasting. She states that she feels better than she did 2 weeks ago and even better than she was 2 days ago. Pt suspects that she went array on her diet the day prior which caused symptoms of gradually improving abdominal pain x 3 weeks, nausea, mild constipation, flatulence, belching and fatigue. Reports that she had been eating a lot of frozen dinners including rice, chicken and a cheese sauce. States recently she has been eating scrambled egg sandwiches with toast. She has been taking Pancrelipase with most of her meals- initially reported that this was causing diarrhea so was not taking it but resumed at my encouragement 2 wk prior. Also started Oxycodone 5mg  <48 hrs ago (which constipation long preceeded) for pain control as hydrocodone was ineffective; has taken a few which she states allows her to nap for 0.5 hour and helps a little with pain, Nexium and Zofran.  She is not currently taking Bentyl and has stopped Tramadol, which was rx'ed chronically and taken sev times daily for L hip pain for many years, as this has potential to cause pancreatitis. Denies vomiting, dark colored urine, light-headedness, dizziness, sob.   Pt has an upcoming appointment with a [facility] (established with Dr. Christella Hartigan) on 8/14.   Requests refill of her chronic meds. She is taking them, inc the adderrall somewhat, still even though she is only managing to barely take care of herself during the day, not leaving the house or getting any physical/mental activity.  Past Medical  History:  Diagnosis Date  . ADD (attention deficit disorder)   . Allergy    ENVIRONMENTAL  . Arthritis    knees  . Asthma   . Depression   . Difficulty sleeping   . Dysphagia   . Fibromyalgia   . Fibromyalgia   . GERD (gastroesophageal reflux disease)   . Hernia of abdominal wall   . Hyperlipidemia   . Hypertension   . IBS (irritable bowel syndrome)   . Migraine   . Numbness and tingling in hands   . Obesity   . Swelling of both ankles   . Thyroid disease    Past Surgical History:  Procedure Laterality Date  . INSERTION OF MESH N/A 03/28/2014   Procedure: INSERTION OF MESH;  Surgeon: Velora Heckler, MD;  Location: [facility];  Service: General;  Laterality: N/A;  . TONSILECTOMY/ADENOIDECTOMY WITH MYRINGOTOMY    . VENTRAL HERNIA REPAIR N/A 03/28/2014   Procedure: LAPAROSCOPIC VENTRAL HERNIA REPAIR WITH MESH;  Surgeon: Velora Heckler, MD;  Location: [facility];  Service: General;  Laterality: N/A;  . WISDOM TOOTH EXTRACTION     Current Outpatient Prescriptions on File Prior to Visit  Medication Sig Dispense Refill  . albuterol (PROVENTIL HFA) 108 (90 BASE) MCG/ACT inhaler INHALE TWO PUFFS BY MOUTH EVERY SIX HOURS AS NEEDED (Patient taking differently: Inhale 2 puffs into the lungs every 6 (six) hours as needed for wheezing or shortness of breath. INHALE TWO PUFFS BY MOUTH EVERY SIX HOURS AS NEEDED)  18 Inhaler 11  . amphetamine-dextroamphetamine (ADDERALL) 10 MG tablet Take 1 tablet (10 mg total) by mouth 2 (two) times daily. 60 tablet 0  . azelastine (OPTIVAR) 0.05 % ophthalmic solution PLACE TWO DRIPS INTO BOTH EYES TWICE DAILY (Patient taking differently: PLACE TWO DRIPS INTO BOTH EYES TWICE DAILY FOR DRY EYES) 6 mL 5  . B Complex-C-Folic Acid (B-COMPLEX BALANCED PO) Take 1 tablet by mouth daily.     . calcium carbonate (OS-CAL) 600 MG TABS Take 600 mg by mouth daily.    . cetirizine (ZYRTEC) 10 MG tablet Take 10 mg by mouth daily as needed for allergies.     . cholecalciferol (VITAMIN  D) 400 UNITS TABS Take 400 Units by mouth daily.    . citalopram (CELEXA) 20 MG tablet TAKE 1 TABLET (20 MG TOTAL) BY MOUTH DAILY. 90 tablet 1  . dicyclomine (BENTYL) 20 MG tablet Take 1 tablet (20 mg total) by mouth 3 (three) times daily before meals. As needed for bowel cramping 90 tablet 0  . montelukast (SINGULAIR) 10 MG tablet Take 1 tablet (10 mg total) by mouth every morning. 90 tablet 3  . Multiple Vitamin (MULTIVITAMIN) tablet Take 1 tablet by mouth daily.    . ondansetron (ZOFRAN) 4 MG tablet Take 1 tablet (4 mg total) by mouth every 6 (six) hours. (Patient taking differently: Take 4 mg by mouth every 8 (eight) hours as needed for nausea or vomiting. ) 60 tablet 1  . oxyCODONE (OXY IR/ROXICODONE) 5 MG immediate release tablet Take 1 tablet (5 mg total) by mouth every 4 (four) hours as needed for severe pain. 30 tablet 0  . Polyethyl Glycol-Propyl Glycol (SYSTANE) 0.4-0.3 % SOLN Apply 1 drop to eye 3 (three) times daily as needed (dry eyes).    . ranitidine (ZANTAC) 150 MG tablet Take 150 mg by mouth 2 (two) times daily as needed for heartburn.    . vitamin C (ASCORBIC ACID) 500 MG tablet Take 1,000 mg by mouth daily.    . [DISCONTINUED] hyoscyamine (CYSTOSPAZ) 0.15 MG tablet Take 0.15 mg by mouth every 4 (four) hours as needed.     No current facility-administered medications on file prior to visit.    Allergies  Allergen Reactions  . Soap     Any soaps with fragrance cause migraines  . Sulfa Antibiotics Nausea And Vomiting   Family History  Problem Relation Age of Onset  . COPD Mother   . Fibromyalgia Mother   . Heart disease Father   . Colon polyps Maternal Grandmother   . Diabetes Paternal Grandfather   . Heart disease Paternal Grandfather   . Colon polyps Maternal Aunt   . Colon polyps Unknown        ? father  . Diabetes Unknown        MGGF, MGGM  . Diabetes Maternal Uncle   . Aneurysm Maternal Aunt   . Stroke Sister   . Kidney disease Maternal Aunt   . Fibromyalgia  Sister   . Fibromyalgia Unknown        cousin  . Colon cancer Neg Hx    Social History   Social History  . Marital status: Legally Separated    Spouse name: N/A  . Number of children: 0  . Years of education: N/A   Occupational History  . 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 Smoking status: Never Smoker  . Smokeless tobacco: Never Used  . Alcohol use No  . Drug  use: No  . Sexual activity: Yes   Other Topics Concern  . None   Social History Narrative   Married. Education: [employer].    [facility] 05/18/2017 05/16/2017 05/04/2017 04/29/2017 04/22/2017  Decreased Interest 0 0 0 0 0  Down, Depressed, Hopeless 0 0 0 0 0  PHQ - 2 Score 0 0 0 0 0    Review of Systems See hpi    Objective:   Physical Exam  Constitutional: She is oriented to person, place, and time. She appears well-developed and well-nourished. She appears lethargic. She appears ill. No distress.  Morbidly obese  HENT:  Head: Normocephalic and atraumatic.  Neck: Normal range of motion. Neck supple. No thyromegaly present.  Cardiovascular: Normal rate, regular rhythm, normal heart sounds and intact distal pulses.   Pulmonary/Chest: Effort normal and breath sounds normal. No respiratory distress.  Abdominal: Soft. Bowel sounds are increased. There is no hepatosplenomegaly (exam limited by body habitus). There is generalized tenderness. There is no rebound, no guarding and no CVA tenderness.  Musculoskeletal: She exhibits no edema.  Lymphadenopathy:    She has no cervical adenopathy.  Neurological: She is oriented to person, place, and time. She appears lethargic.  Skin: Skin is warm and dry. She is not diaphoretic. No erythema. There is pallor.  Psychiatric: Her speech is normal and behavior is normal. Her affect is blunt. Cognition and memory are normal. She exhibits a depressed mood.       BP 135/80   Pulse 64   Temp 98.4 F (36.9 C) (Oral)   Resp 18   Ht 5\' 2"   (1.575 m)   Wt 264 lb (119.7 kg)   SpO2 96%   BMI 48.29 kg/m   Results for orders placed or performed in visit on 05/17/17  Lipid panel  Result Value Ref Range   Cholesterol, Total 216 (H) 100 - 199 mg/dL   Triglycerides 147253 (H) 0 - 149 mg/dL   HDL 38 (L) >82>39 mg/dL   VLDL Cholesterol Cal 51 (H) 5 - 40 mg/dL   LDL Calculated 956127 (H) 0 - 99 mg/dL   Chol/HDL Ratio 5.7 (H) 0.0 - 4.4 ratio    Assessment & Plan:   1. Idiopathic acute pancreatitis without infection or necrosis  Concerned that pt is having pain out of proportion to her labs - still with significant pain on abd exam and weakness, unable to return to work so recommended we proceed with further eval with acute abd series or CT. However, pt would like to hold off and just keep plan to f/u with GI in 6d. Cont low fat diet and pancrealipase. Start miralax to avoid constipation. If any worsening of sxs at all -> needs CT asap. Pt has FMLA out through 8/18 which she still anticipates will be sufficient. Lab Results  Component Value Date   LIPASE 31 05/16/2017   LIPASE 78 (H) 05/04/2017   LIPASE 82 (H) 04/29/2017   LIPASE 57 (H) 04/22/2017   LIPASE 86 (H) 04/22/2017     2. Irritable bowel syndrome, unspecified type   3. Attention deficit disorder (ADD) without hyperactivity - pt is on adderall 10mg  bid - however, Epic was down and I did not have her med list, pt needed refill so I wrote her a paper rx but she told me she was on 20mg  - so has 1 mo of 20mg  bid #60. However, next month will, change pt back to 10mg  bid - she will need to call for  refill when do and pick up rx.  4. Prediabetes - check a1c w/ next labs, pt has increased her diet in carbs sig Lab Results  Component Value Date   HGBA1C 5.6 10/18/2016   HGBA1C 5.8 05/23/2015   HGBA1C 5.8 05/31/2014    5. Hyperlipidemia, unspecified hyperlipidemia type - not high enough to cause pancreatitis. Reviewed diagnosis of metabolic syndrome/pre-DM with high trig/low HDL pattern    6. BMI 45.0-49.9, adult (HCC) - weight loss during acute episode has stabilized  7. Weak - try a few wks of on iron supp qam as pt has not been getting in much meat or veggies - only a little chicken - largely white carbs. Increase diet in veggies and protein. Start miralax qd to counteract iron.  Refilled chronic meds. Recheck after GI visit - likely will want to recheck before RTW unless doing fabulous in which case f/u in 1-2 mos - will want a1c, tsh at that visit. PFTs recently??  Meds ordered this encounter  Medications  . DISCONTD: amphetamine-dextroamphetamine (ADDERALL) 20 MG tablet    Sig: Take 1 tablet (20 mg total) by mouth 2 (two) times daily.    Dispense:  60 tablet    Refill:  0  . carisoprodol (SOMA) 350 MG tablet    Sig: Take 1 tablet (350 mg total) by mouth 4 (four) times daily as needed for muscle spasms.    Dispense:  120 tablet    Refill:  2    Please fax to [facility] on [address] F: [phone]  . esomeprazole (NEXIUM) 40 MG capsule    Sig: Take 1 capsule (40 mg total) by mouth daily.    Dispense:  90 capsule    Refill:  0  . Fluticasone-Salmeterol (ADVAIR DISKUS) 500-50 MCG/DOSE AEPB    Sig: Inhale 1 puff into the lungs 2 (two) times daily.    Dispense:  60 each    Refill:  1  . pregabalin (LYRICA) 150 MG capsule    Sig: Take 1 capsule (150 mg total) by mouth 2 (two) times daily.    Dispense:  180 capsule    Refill:  1    Please fax to [facility] on [address] F: [phone]  . SUMAtriptan (IMITREX) 100 MG tablet    Sig: Take 1 tablet (100 mg total) by mouth every 2 (two) hours as needed for migraine. No more than 2 doses in 24 hours.    Dispense:  8 tablet    Refill:  3  . levothyroxine (SYNTHROID) 75 MCG tablet    Sig: Take 1 tablet (75 mcg total) by mouth daily before breakfast.    Dispense:  90 tablet    Refill:  0  . telmisartan (MICARDIS) 80 MG tablet    Sig: Take 1 tablet (80 mg total) by mouth daily.    Dispense:  90 tablet     Refill:  0     I personally performed the services described in this documentation, which was scribed in my presence. The recorded information has been reviewed and considered, and addended by me as needed.   Norberto Sorenson, M.D.  Primary Care at [facility] [address] [place], [place] [address] [phone] phone [phone] fax  05/18/17 7:56 PM

## 2017-05-18 NOTE — Patient Instructions (Signed)
     IF you received an x-ray today, you will receive an invoice from [facility]. Please contact [facility] at [phone] with questions or concerns regarding your invoice.   IF you received labwork today, you will receive an invoice from [facility]. Please contact [facility] at [phone] with questions or concerns regarding your invoice.   Our billing staff will not be able to assist you with questions regarding bills from these companies.  You will be contacted with the lab results as soon as they are available. The fastest way to get your results is to activate your My Chart account. Instructions are located on the last page of this paperwork. If you have not heard from us regarding the results in 2 weeks, please contact this office.     

## 2017-05-24 ENCOUNTER — Ambulatory Visit (INDEPENDENT_AMBULATORY_CARE_PROVIDER_SITE_OTHER): Payer: 59 | Admitting: Physician Assistant

## 2017-05-24 ENCOUNTER — Encounter: Payer: Self-pay | Admitting: Physician Assistant

## 2017-05-24 VITALS — BP 132/76 | HR 88 | Ht 62.0 in | Wt 267.6 lb

## 2017-05-24 DIAGNOSIS — R1013 Epigastric pain: Secondary | ICD-10-CM

## 2017-05-24 DIAGNOSIS — K8500 Idiopathic acute pancreatitis without necrosis or infection: Secondary | ICD-10-CM

## 2017-05-24 MED ORDER — PANCRELIPASE (LIP-PROT-AMYL) 36000-114000 UNITS PO CPEP: 36000.0000 [IU] | ORAL_CAPSULE | ORAL | 11 refills | Status: DC

## 2017-05-24 NOTE — Progress Notes (Signed)
I agree with the above note, plan 

## 2017-05-24 NOTE — Patient Instructions (Signed)
We have sent the following medications to your pharmacy for you to pick up at your convenience: Creon 36,000 units 4 capsules with each meal and then 1 with snacks  You have been scheduled for a CT scan of the abdomen and pelvis at [facility] ([address]---this is in the same building as Press photographer).   You are scheduled on 05/26/17 at 1 pm. You should arrive 15 minutes prior to your appointment time for registration. Please follow the written instructions below on the day of your exam:  WARNING: IF YOU ARE ALLERGIC TO IODINE/X-RAY DYE, PLEASE NOTIFY RADIOLOGY IMMEDIATELY AT [phone]! YOU WILL BE GIVEN A 13 HOUR PREMEDICATION PREP.  1) Do not eat anything after 9 am (4 hours prior to your test) 2) You have been given 2 bottles of oral contrast to drink. The solution may taste better if refrigerated, but do NOT add ice or any other liquid to this solution. Shake well before drinking.    Drink 1 bottle of contrast @ 12 pm (1 hour prior to your exam)  You may take any medications as prescribed with a small amount of water except for the following: Metformin, Glucophage, Glucovance, Avandamet, Riomet, Fortamet, Actoplus Met, Janumet, Glumetza or Metaglip. The above medications must be held the day of the exam AND 48 hours after the exam.  The purpose of you drinking the oral contrast is to aid in the visualization of your intestinal tract. The contrast solution may cause some diarrhea. Before your exam is started, you will be given a small amount of fluid to drink. Depending on your individual set of symptoms, you may also receive an intravenous injection of x-ray contrast/dye. Plan on being at [facility] for 30 minutes or longer, depending on the type of exam you are having performed.  This test typically takes 30-45 minutes to complete.  If you have any questions regarding your exam or if you need to reschedule, you may call the CT department at [phone]  between the hours of 8:00 am and 5:00 pm, Monday-Friday.  ________________________________________________________________________

## 2017-05-24 NOTE — Progress Notes (Signed)
Chief Complaint: Pancreatitis, Epigastric abdominal pain  HPI:  Gloria Carter is a 51 year old Caucasian female with a past medical history as listed below including prior episode of idiopathic pancreatitis, who was referred to me by Sherren MochaShaw, Eva N, MD for a complaint of recurrent pancreatitis .      Patient was initially seen in our office by Dr. Christella HartiganJacobs for epigastric pain with an abnormal CT at that time showing acute pancreatitis involving the head and uncinate process. Ultrasound and HIDA scan were checked which were normal.   Most recently patient was seen in the hospital from 04/22/17-04/26/17 for severe sepsis thought related to pancreatitis. At time of admission she had white count of 41,000, she was tachycardic and tachypnea. Her chemistry shows sodium of 123, potassium of 2.5, chloride of 78 and acute kidney injury with creatinine 1.43. Lipase was only 57. CT of the abdomen showed acute pancreatitis without necrotizing, abscess or fluid collection, biliary duct dilatation or gallstones. This resolved during her hospital stay and patient was tolerating advance diet at discharge. She was given Creon to help with recurrent pancreatitis symptoms. Patient was given Vancomycin and Zosyn empirically due to a gram-positive bacteremia, discharged on Augmentin for 7 days after.    Per chart review since time of discharge patient has been seen by her PCP on 3 separate occasions with continued epigastric abdominal pain. Lipase has been checked and was normal on 05/18/17. Patient has been changed to Oxycodone from tramadol as it was thought possibly this may have caused pancreatitis. Repeat CT was removed and recommended that she have continued with pain, but she chose not to have one at that time.   Today, the patient presents to clinic accompanied by multiple family members including her mother and mother in law as well as others in the lobby. She tells me today that she continues with a 7/10 epigastric pain  which radiates into her back and is somewhat improved if she lays down. She has been using oxycodone as recommended but this only helps with her pain some. Sometimes her pain still increases to a 9/10, "like last night". She has occasional nausea. Regardless of the Zofran that she has now scheduled TID. She has been staying on a low-fat diet but is able to tolerate mashed potatoes and grilled chicken without any abdominal pain afterwards. Her bowel movements have been normal recently. She has continued on her Creon 36,000 lipase units 2 with a meal 3 times a day.   Patient denies a fever, blood in her stool, melena, anorexia, vomiting, heartburn, reflux or symptoms that awaken her at night.  Past Medical History:  Diagnosis Date  . ADD (attention deficit disorder)   . Allergy    ENVIRONMENTAL  . Arthritis    knees  . Asthma   . Depression   . Difficulty sleeping   . Dysphagia   . Fibromyalgia   . GERD (gastroesophageal reflux disease)   . Hernia of abdominal wall   . Hyperlipidemia   . Hypertension   . IBS (irritable bowel syndrome)   . Migraine   . Numbness and tingling in hands   . Obesity   . Swelling of both ankles   . Thyroid disease     Past Surgical History:  Procedure Laterality Date  . INSERTION OF MESH N/A 03/28/2014   Procedure: INSERTION OF MESH;  Surgeon: Velora Hecklerodd M Gerkin, MD;  Location: [facility];  Service: General;  Laterality: N/A;  . TONSILECTOMY/ADENOIDECTOMY WITH MYRINGOTOMY    .  VENTRAL HERNIA REPAIR N/A 03/28/2014   Procedure: LAPAROSCOPIC VENTRAL HERNIA REPAIR WITH MESH;  Surgeon: Velora Heckler, MD;  Location: [facility];  Service: General;  Laterality: N/A;  . WISDOM TOOTH EXTRACTION      Current Outpatient Prescriptions  Medication Sig Dispense Refill  . albuterol (PROVENTIL HFA) 108 (90 BASE) MCG/ACT inhaler INHALE TWO PUFFS BY MOUTH EVERY SIX HOURS AS NEEDED (Patient taking differently: Inhale 2 puffs into the lungs every 6 (six) hours as needed for wheezing or  shortness of breath. INHALE TWO PUFFS BY MOUTH EVERY SIX HOURS AS NEEDED) 18 Inhaler 11  . amphetamine-dextroamphetamine (ADDERALL) 10 MG tablet Take 1 tablet (10 mg total) by mouth 2 (two) times daily. 60 tablet 0  . azelastine (OPTIVAR) 0.05 % ophthalmic solution PLACE TWO DRIPS INTO BOTH EYES TWICE DAILY (Patient taking differently: PLACE TWO DRIPS INTO BOTH EYES TWICE DAILY FOR DRY EYES) 6 mL 5  . B Complex-C-Folic Acid (B-COMPLEX BALANCED PO) Take 1 tablet by mouth daily.     . calcium carbonate (OS-CAL) 600 MG TABS Take 600 mg by mouth daily.    . carisoprodol (SOMA) 350 MG tablet Take 1 tablet (350 mg total) by mouth 4 (four) times daily as needed for muscle spasms. 120 tablet 2  . cetirizine (ZYRTEC) 10 MG tablet Take 10 mg by mouth daily as needed for allergies.     . cholecalciferol (VITAMIN D) 400 UNITS TABS Take 400 Units by mouth daily.    . citalopram (CELEXA) 20 MG tablet TAKE 1 TABLET (20 MG TOTAL) BY MOUTH DAILY. 90 tablet 1  . dicyclomine (BENTYL) 20 MG tablet Take 1 tablet (20 mg total) by mouth 3 (three) times daily before meals. As needed for bowel cramping 90 tablet 0  . esomeprazole (NEXIUM) 40 MG capsule Take 1 capsule (40 mg total) by mouth daily. 90 capsule 0  . Fluticasone-Salmeterol (ADVAIR DISKUS) 500-50 MCG/DOSE AEPB Inhale 1 puff into the lungs 2 (two) times daily. 60 each 1  . levothyroxine (SYNTHROID) 75 MCG tablet Take 1 tablet (75 mcg total) by mouth daily before breakfast. 90 tablet 0  . montelukast (SINGULAIR) 10 MG tablet Take 1 tablet (10 mg total) by mouth every morning. 90 tablet 3  . Multiple Vitamin (MULTIVITAMIN) tablet Take 1 tablet by mouth daily.    . ondansetron (ZOFRAN) 4 MG tablet Take 1 tablet (4 mg total) by mouth every 6 (six) hours. (Patient taking differently: Take 4 mg by mouth every 8 (eight) hours as needed for nausea or vomiting. ) 60 tablet 1  . oxyCODONE (OXY IR/ROXICODONE) 5 MG immediate release tablet Take 1 tablet (5 mg total) by mouth  every 4 (four) hours as needed for severe pain. 30 tablet 0  . Polyethyl Glycol-Propyl Glycol (SYSTANE) 0.4-0.3 % SOLN Apply 1 drop to eye 3 (three) times daily as needed (dry eyes).    . pregabalin (LYRICA) 150 MG capsule Take 1 capsule (150 mg total) by mouth 2 (two) times daily. 180 capsule 1  . ranitidine (ZANTAC) 150 MG tablet Take 150 mg by mouth 2 (two) times daily as needed for heartburn.    . SUMAtriptan (IMITREX) 100 MG tablet Take 1 tablet (100 mg total) by mouth every 2 (two) hours as needed for migraine. No more than 2 doses in 24 hours. 8 tablet 3  . telmisartan (MICARDIS) 80 MG tablet Take 1 tablet (80 mg total) by mouth daily. 90 tablet 0  . vitamin C (ASCORBIC ACID) 500 MG tablet Take  1,000 mg by mouth daily.    . lipase/protease/amylase (CREON) 36000 UNITS CPEP capsule Take 1 capsule (36,000 Units total) by mouth as directed. 4 capsules with a meal And 1 capsule with snacks 390 capsule 11   No current facility-administered medications for this visit.     Allergies as of 05/24/2017 - Review Complete 05/24/2017  Allergen Reaction Noted  . Soap  03/28/2014  . Sulfa antibiotics Nausea And Vomiting 12/18/2011    Family History  Problem Relation Age of Onset  . COPD Mother   . Fibromyalgia Mother   . Heart disease Father   . Colon polyps Father   . Stroke Sister   . Fibromyalgia Sister   . Colon polyps Maternal Grandmother   . Lung disease Paternal Grandmother   . Stroke Paternal Grandmother   . Diabetes Paternal Grandfather   . Heart disease Paternal Grandfather   . Diabetes Maternal Uncle   . Colon polyps Maternal Aunt   . Aortic aneurysm Maternal Aunt   . Cystic kidney disease Maternal Aunt        kidney cyst  . Kidney disease Maternal Aunt   . Fibromyalgia Unknown        cousin  . Colon cancer Neg Hx     Social History   Social History  . Marital status: Legally Separated    Spouse name: N/A  . Number of children: 0  . Years of education: N/A    Occupational History  . 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 Smoking status: Never Smoker  . Smokeless tobacco: Never Used  . Alcohol use No  . Drug use: No  . Sexual activity: Yes   Other Topics Concern  . Not on file   Social History Narrative   Married. Education: [employer].     Review of Systems:    Constitutional: No weight loss, fever or chills Skin: No rash  Cardiovascular: No chest pain Respiratory: No SOB  Gastrointestinal: See HPI and otherwise negative Genitourinary: No dysuria or change in urinary frequency Neurological: No headache, dizziness or syncope Musculoskeletal: Positive for fibromyalgia Hematologic: No bleeding  Psychiatric: Positive history of depression and anxiety   Physical Exam:  Vital signs: BP 132/76   Pulse 88   Ht 5\' 2"  (1.575 m)   Wt 267 lb 9.6 oz (121.4 kg)   BMI 48.94 kg/m   Constitutional:   Pleasant obese Caucasian female appears to be in mild distress, Well developed, Well nourished, alert and cooperative, lays on exam table during entire interview Head:  Normocephalic and atraumatic. Eyes:   PEERL, EOMI. No icterus. Conjunctiva pink. Ears:  Normal auditory acuity. Neck:  Supple Throat: Oral cavity and pharynx without inflammation, swelling or lesion.  Respiratory: Respirations even and unlabored. Lungs clear to auscultation bilaterally.   No wheezes, crackles, or rhonchi.  Cardiovascular: Normal S1, S2. No MRG. Regular rate and rhythm. No peripheral edema, cyanosis or pallor.  Gastrointestinal:  Soft, nondistended, moderate ttp in epigastrum No rebound or guarding. Normal bowel sounds. No appreciable masses or hepatomegaly. Rectal:  Not performed.  Msk:  Symmetrical without gross deformities. Without edema, no deformity or joint abnormality.  Neurologic:  Alert and  oriented x4;  grossly normal neurologically.  Skin:   Dry and intact without significant lesions or rashes. Psychiatric: Demonstrates good  judgement and reason without abnormal affect or behaviors.  RELEVANT LABS AND IMAGING: CBC    Component Value Date/Time   WBC 5.6 05/16/2017 1535  WBC 8.9 05/04/2017 1516   WBC 16.9 (H) 04/26/2017 0458   RBC 4.15 05/16/2017 1535   RBC 4.76 05/04/2017 1516   RBC 3.76 (L) 04/26/2017 0458   HGB 11.9 05/16/2017 1535   HCT 35.6 05/16/2017 1535   PLT 193 05/16/2017 1535   MCV 86 05/16/2017 1535   MCH 28.7 05/16/2017 1535   MCH 28.8 05/04/2017 1516   MCH 29.0 04/26/2017 0458   MCHC 33.4 05/16/2017 1535   MCHC 33.3 05/04/2017 1516   MCHC 33.2 04/26/2017 0458   RDW 13.6 05/16/2017 1535   LYMPHSABS 2.0 04/22/2017 1913   MONOABS 4.0 (H) 04/22/2017 1913   EOSABS 0.0 04/22/2017 1913   BASOSABS 0.0 04/22/2017 1913    CMP     Component Value Date/Time   NA 140 05/17/2017 1510   K 3.8 05/17/2017 1510   CL 100 05/17/2017 1510   CO2 23 05/17/2017 1510   GLUCOSE 118 (H) 05/17/2017 1510   GLUCOSE 110 (H) 04/25/2017 0445   BUN 11 05/17/2017 1510   CREATININE 0.66 05/17/2017 1510   CREATININE 0.65 02/28/2016 1233   CALCIUM 9.3 05/17/2017 1510   PROT 6.9 05/17/2017 1510   ALBUMIN 4.0 05/17/2017 1510   AST 23 05/17/2017 1510   ALT 19 05/17/2017 1510   ALKPHOS 121 (H) 05/17/2017 1510   BILITOT 0.4 05/17/2017 1510   GFRNONAA 101 05/17/2017 1510   GFRNONAA >89 08/23/2014 1707   GFRAA 117 05/17/2017 1510   GFRAA >89 08/23/2014 1707    CT ABDOMEN AND PELVIS WITH CONTRAST 04/22/17  TECHNIQUE: Multidetector CT imaging of the abdomen and pelvis was performed using the standard protocol following bolus administration of intravenous contrast.  CONTRAST:  ISOVUE-300 IOPAMIDOL (ISOVUE-300) INJECTION 61%  COMPARISON:  Abdominal radiographs from earlier today.  FINDINGS: Lower chest: No significant pulmonary nodules or acute consolidative airspace disease.  Hepatobiliary: Diffuse hepatic steatosis. No definite liver surface irregularity. No liver mass. Normal gallbladder  with no radiopaque cholelithiasis. No biliary ductal dilatation.  Pancreas: Diffuse pancreatic parenchymal thickening with prominent diffuse peripancreatic fat stranding and ill-defined fluid, compatible with acute pancreatitis. No definite regions of pancreatic parenchymal nonenhancement or pancreatic parenchymal gas to suggest necrotizing pancreatitis. No measurable peripancreatic fluid collections. No pancreatic mass or duct dilation.  Spleen: Normal size spleen. Punctate scattered granulomatous splenic calcifications. No splenic mass.  Adrenals/Urinary Tract: Normal adrenals. Hydronephrosis. Simple 1.9 cm lateral interpolar right renal cyst. Normal bladder.  Stomach/Bowel: Grossly normal stomach. Normal caliber small bowel with no small bowel wall thickening. Normal appendix. Minimal sigmoid diverticulosis, with no large bowel wall thickening or pericolonic fat stranding.  Vascular/Lymphatic: Atherosclerotic nonaneurysmal abdominal aorta. Patent portal, splenic, hepatic and renal veins. No pathologically enlarged lymph nodes in the abdomen or pelvis.  Reproductive: Grossly normal uterus.  No adnexal mass.  Other: No pneumoperitoneum, ascites or focal fluid collection. Small fat containing supraumbilical midline ventral abdominal hernia.  Musculoskeletal: No aggressive appearing focal osseous lesions. Mild thoracolumbar spondylosis.  IMPRESSION: 1. Prominent acute pancreatitis. No evidence of necrotizing pancreatitis or measurable peripancreatic fluid collections. 2. No radiopaque cholelithiasis.  No biliary ductal dilatation. 3. Diffuse hepatic steatosis. 4.  Aortic Atherosclerosis (ICD10-I70.0). 5. Small fat containing supraumbilical midline ventral abdominal hernia .   Electronically Signed   By: Delbert Phenix M.D.   On: 04/22/2017 20:10  Assessment: 1. Pancreatitis:h/o one episode in 2017, again now 04/2017, idiopathic, continues with abdominal pain 6-7  weeks later, will repeat CT at this time to compare and ensure that she is getting better  and r/o other causes 2. Epigastric abdominal pain  Plan: 1. Discussed with the patient that we are unsure of the source of her pancreatitis. At this time recommend she maintain a low-fat diet going forward. 2. Increased patient's Creon to 36,000 units 4 with a meal and 1 with a snack, provided a prescription for this 3. Discussed the patient that above will help her to avoid future attacks 4. Scheduled patient for a CT abdomen with contrast to compare to the last one to ensure that her pancreatitis is resolving and not getting worse and that she has not developed another process giving her ongoing pain 5. Would recommend the patient stop eating/ go on a clear liquid diet maintaining lots of fluids and hydration if she has further epigastric pain 6. Patient to follow in clinic with Dr. Christella Hartigan in one to 2 months or sooner if increase/worsening of symptoms.  Hyacinth Meeker, PA-C [facility] 05/24/2017, 2:46 PM  Cc: Sherren Mocha, MD

## 2017-05-26 ENCOUNTER — Ambulatory Visit (INDEPENDENT_AMBULATORY_CARE_PROVIDER_SITE_OTHER)
Admission: RE | Admit: 2017-05-26 | Discharge: 2017-05-26 | Disposition: A | Discharge status: Home / Self Care | Payer: 59 | Source: Ambulatory Visit | Attending: Physician Assistant | Admitting: Physician Assistant

## 2017-05-26 DIAGNOSIS — R1013 Epigastric pain: Secondary | ICD-10-CM

## 2017-05-26 DIAGNOSIS — K8500 Idiopathic acute pancreatitis without necrosis or infection: Secondary | ICD-10-CM | POA: Diagnosis not present

## 2017-05-26 MED ORDER — IOPAMIDOL (ISOVUE-300) INJECTION 61%
100.0000 mL | Freq: Once | INTRAVENOUS | Status: AC | PRN
  Administered 2017-05-26: 100 mL via INTRAVENOUS

## 2017-05-27 ENCOUNTER — Encounter: Payer: Self-pay | Admitting: Family Medicine

## 2017-06-01 ENCOUNTER — Encounter: Payer: Self-pay | Admitting: Family Medicine

## 2017-06-01 ENCOUNTER — Ambulatory Visit (INDEPENDENT_AMBULATORY_CARE_PROVIDER_SITE_OTHER): Payer: 59 | Admitting: Family Medicine

## 2017-06-01 VITALS — BP 124/86 | HR 115 | Temp 97.7°F | Resp 18 | Ht 62.0 in | Wt 263.6 lb

## 2017-06-01 DIAGNOSIS — M797 Fibromyalgia: Secondary | ICD-10-CM | POA: Diagnosis not present

## 2017-06-01 DIAGNOSIS — K8500 Idiopathic acute pancreatitis without necrosis or infection: Secondary | ICD-10-CM | POA: Diagnosis not present

## 2017-06-01 DIAGNOSIS — G894 Chronic pain syndrome: Secondary | ICD-10-CM | POA: Diagnosis not present

## 2017-06-01 MED ORDER — OXYCODONE HCL 5 MG PO TABS: 5.0000 mg | ORAL_TABLET | ORAL | 0 refills | Status: DC | PRN

## 2017-06-01 MED ORDER — PREGABALIN 150 MG PO CAPS: 150.0000 mg | ORAL_CAPSULE | Freq: Three times a day (TID) | ORAL | 0 refills | Status: DC

## 2017-06-01 MED ORDER — CARISOPRODOL 350 MG PO TABS: 350.0000 mg | ORAL_TABLET | Freq: Four times a day (QID) | ORAL | 2 refills | Status: DC | PRN

## 2017-06-01 MED ORDER — CITALOPRAM HYDROBROMIDE 40 MG PO TABS: 40.0000 mg | ORAL_TABLET | Freq: Every day | ORAL | 0 refills | Status: DC

## 2017-06-01 NOTE — Patient Instructions (Addendum)
     IF you received an x-ray today, you will receive an invoice from [facility]. Please contact [facility] at [phone] with questions or concerns regarding your invoice.   IF you received labwork today, you will receive an invoice from [facility]. Please contact [facility] at [phone] with questions or concerns regarding your invoice.   Our billing staff will not be able to assist you with questions regarding bills from these companies.  You will be contacted with the lab results as soon as they are available. The fastest way to get your results is to activate your My Chart account. Instructions are located on the last page of this paperwork. If you have not heard from [place] regarding the results in 2 weeks, please contact this office.     You can call the 24-hour [facility] HelpLine at [phone] or [phone] for immediate assistance. Among several different types of services, they offer an Intensive oupatient program for mood disorders - which is a group type setting Monday-Friday 9-noon.  You can schedule an assessment by calling the above numbers during which the costs for the program and insurance benefits will be reviewed.    No psychological or psychiatric services take physician referrals - they always want the patient to call. Some excellent private psychiatrists for individual counseling are:  [facility] at [facility] [address] [place], [place] [address] Phone: [phone]  [facility]  [address], [place], [place] [address]  [phone]  [facility] [address] [place], [place], [place] [address]  Phone: [phone]   [facility] [address], [place], [place] [address]  [phone]  [facility] [address] [place], [place] Phone: [phone]   [facility] - Arayla Sazama is really good [address], [place], [place] [address]  [phone]

## 2017-06-01 NOTE — Progress Notes (Addendum)
Subjective:   By signing my name below, I, [name], attest that this documentation has been prepared under the direction and in the presence of Norberto Sorenson, MD Electronically Signed: Charline Bills, ED Scribe 06/01/2017 at 4:26 PM.   Patient ID: Gloria Carter, female    DOB: May 06, 1966, 51 y.o.   MRN: [id number]  Chief Complaint  Patient presents with  . Pancreatitis  . Follow-up   HPI Gloria Carter is a 51 y.o. female who presents to Primary Care at [facility] for a follow-up on pancreatitis. Pt saw gastroenterologist PA-C Hyacinth Meeker who increased Creon to 36000 units/daily. Pt had another CT done on 05/26/17 which showed no cysts or abscess and improvement on previous pancreatitis. Also noted a small periumbilical hernia which contains fat only. Pt had mesh inserted on 03/30/14 by Dr. Gerrit Friends.    Today, pt states that she is doing a little better since last visit; states she has been sitting upright more than she was at last visit but abdominal pain is still slightly improved with lying down. She has not been nauseous often; has only taken Zofran twice in the past week and eaten a week crackers when she does feel nauseous. Pt's father takes her to the grocery store once a week which she is able to do for 1 hour with a grocery cart. She describes the experience as "painful and tiring". States she occasionally walks around her yard. [place] a driveway which is downhill but she is able to park beside her home which is on a flatter surface. She does not have any stairs in her home. Pt is not driving; states it's difficult to step up into her SUV. Currently taking Oxycodone bid, Lyrica bid, Soma tid which improves back pain and muscle tightness in shoulders. She is not a member of a gym. She has seen a marriage therapist, Dr. Dessie Coma, but did not return last year after pancreatitis diagnosis.   FMLA Pt states that she spends a lot of time sitting at the computer at  work. She is not allowed to work from home but is able to work half-days. Pt states that she has not used her FMLA in the past 14 months since pancreatitis diagnosis last year.   Past Medical History:  Diagnosis Date  . ADD (attention deficit disorder)   . Allergy    ENVIRONMENTAL  . Arthritis    knees  . Asthma   . Depression   . Difficulty sleeping   . Dysphagia   . Fibromyalgia   . GERD (gastroesophageal reflux disease)   . Hernia of abdominal wall   . Hyperlipidemia   . Hypertension   . IBS (irritable bowel syndrome)   . Migraine   . Numbness and tingling in hands   . Obesity   . Swelling of both ankles   . Thyroid disease    Current Outpatient Prescriptions on File Prior to Visit  Medication Sig Dispense Refill  . albuterol (PROVENTIL HFA) 108 (90 BASE) MCG/ACT inhaler INHALE TWO PUFFS BY MOUTH EVERY SIX HOURS AS NEEDED (Patient taking differently: Inhale 2 puffs into the lungs every 6 (six) hours as needed for wheezing or shortness of breath. INHALE TWO PUFFS BY MOUTH EVERY SIX HOURS AS NEEDED) 18 Inhaler 11  . amphetamine-dextroamphetamine (ADDERALL) 10 MG tablet Take 1 tablet (10 mg total) by mouth 2 (two) times daily. 60 tablet 0  . azelastine (OPTIVAR) 0.05 % ophthalmic solution PLACE TWO DRIPS INTO BOTH EYES TWICE  DAILY (Patient taking differently: PLACE TWO DRIPS INTO BOTH EYES TWICE DAILY FOR DRY EYES) 6 mL 5  . B Complex-C-Folic Acid (B-COMPLEX BALANCED PO) Take 1 tablet by mouth daily.     . calcium carbonate (OS-CAL) 600 MG TABS Take 600 mg by mouth daily.    . carisoprodol (SOMA) 350 MG tablet Take 1 tablet (350 mg total) by mouth 4 (four) times daily as needed for muscle spasms. 120 tablet 2  . cetirizine (ZYRTEC) 10 MG tablet Take 10 mg by mouth daily as needed for allergies.     . cholecalciferol (VITAMIN D) 400 UNITS TABS Take 400 Units by mouth daily.    . citalopram (CELEXA) 20 MG tablet TAKE 1 TABLET (20 MG TOTAL) BY MOUTH DAILY. 90 tablet 1  . dicyclomine  (BENTYL) 20 MG tablet Take 1 tablet (20 mg total) by mouth 3 (three) times daily before meals. As needed for bowel cramping 90 tablet 0  . esomeprazole (NEXIUM) 40 MG capsule Take 1 capsule (40 mg total) by mouth daily. 90 capsule 0  . Fluticasone-Salmeterol (ADVAIR DISKUS) 500-50 MCG/DOSE AEPB Inhale 1 puff into the lungs 2 (two) times daily. 60 each 1  . levothyroxine (SYNTHROID) 75 MCG tablet Take 1 tablet (75 mcg total) by mouth daily before breakfast. 90 tablet 0  . lipase/protease/amylase (CREON) 36000 UNITS CPEP capsule Take 1 capsule (36,000 Units total) by mouth as directed. 4 capsules with a meal And 1 capsule with snacks 390 capsule 11  . montelukast (SINGULAIR) 10 MG tablet Take 1 tablet (10 mg total) by mouth every morning. 90 tablet 3  . Multiple Vitamin (MULTIVITAMIN) tablet Take 1 tablet by mouth daily.    . ondansetron (ZOFRAN) 4 MG tablet Take 1 tablet (4 mg total) by mouth every 6 (six) hours. (Patient taking differently: Take 4 mg by mouth every 8 (eight) hours as needed for nausea or vomiting. ) 60 tablet 1  . oxyCODONE (OXY IR/ROXICODONE) 5 MG immediate release tablet Take 1 tablet (5 mg total) by mouth every 4 (four) hours as needed for severe pain. 30 tablet 0  . Polyethyl Glycol-Propyl Glycol (SYSTANE) 0.4-0.3 % SOLN Apply 1 drop to eye 3 (three) times daily as needed (dry eyes).    . pregabalin (LYRICA) 150 MG capsule Take 1 capsule (150 mg total) by mouth 2 (two) times daily. 180 capsule 1  . ranitidine (ZANTAC) 150 MG tablet Take 150 mg by mouth 2 (two) times daily as needed for heartburn.    . SUMAtriptan (IMITREX) 100 MG tablet Take 1 tablet (100 mg total) by mouth every 2 (two) hours as needed for migraine. No more than 2 doses in 24 hours. 8 tablet 3  . telmisartan (MICARDIS) 80 MG tablet Take 1 tablet (80 mg total) by mouth daily. 90 tablet 0  . vitamin C (ASCORBIC ACID) 500 MG tablet Take 1,000 mg by mouth daily.    . [DISCONTINUED] hyoscyamine (CYSTOSPAZ) 0.15 MG  tablet Take 0.15 mg by mouth every 4 (four) hours as needed.     No current facility-administered medications on file prior to visit.    Allergies  Allergen Reactions  . Soap     Any soaps with fragrance cause migraines  . Sulfa Antibiotics Nausea And Vomiting   Past Surgical History:  Procedure Laterality Date  . INSERTION OF MESH N/A 03/28/2014   Procedure: INSERTION OF MESH;  Surgeon: Velora Heckler, MD;  Location: [facility];  Service: General;  Laterality: N/A;  . TONSILECTOMY/ADENOIDECTOMY WITH  MYRINGOTOMY    . VENTRAL HERNIA REPAIR N/A 03/28/2014   Procedure: LAPAROSCOPIC VENTRAL HERNIA REPAIR WITH MESH;  Surgeon: Velora Heckler, MD;  Location: [facility];  Service: General;  Laterality: N/A;  . WISDOM TOOTH EXTRACTION     Family History  Problem Relation Age of Onset  . COPD Mother   . Fibromyalgia Mother   . Heart disease Father   . Colon polyps Father   . Stroke Sister   . Fibromyalgia Sister   . Colon polyps Maternal Grandmother   . Lung disease Paternal Grandmother   . Stroke Paternal Grandmother   . Diabetes Paternal Grandfather   . Heart disease Paternal Grandfather   . Diabetes Maternal Uncle   . Colon polyps Maternal Aunt   . Aortic aneurysm Maternal Aunt   . Cystic kidney disease Maternal Aunt        kidney cyst  . Kidney disease Maternal Aunt   . Fibromyalgia Unknown        cousin  . Colon cancer Neg Hx    Social History   Social History  . Marital status: Legally Separated    Spouse name: N/A  . Number of children: 0  . Years of education: N/A   Occupational History  . 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 Smoking status: Never Smoker  . Smokeless tobacco: Never Used  . Alcohol use No  . Drug use: No  . Sexual activity: Yes   Other Topics Concern  . None   Social History Narrative   Married. Education: [employer].    Depression screen [facility] 2/9 06/01/2017 05/18/2017 05/16/2017 05/04/2017 04/29/2017  Decreased Interest 0 0 0 0 0    Down, Depressed, Hopeless 0 0 0 0 0  PHQ - 2 Score 0 0 0 0 0    Review of Systems  Gastrointestinal: Positive for abdominal pain and nausea (occasional).  Musculoskeletal: Positive for arthralgias, back pain and myalgias.      Objective:   Physical Exam  Constitutional: She is oriented to person, place, and time. She appears well-developed and well-nourished. No distress.  HENT:  Head: Normocephalic and atraumatic.  Eyes: Conjunctivae and EOM are normal.  Neck: Neck supple. No tracheal deviation present.  Cardiovascular: Normal rate, regular rhythm and normal heart sounds.   Pulmonary/Chest: Effort normal and breath sounds normal. No respiratory distress.  Abdominal: Soft. Bowel sounds are normal. There is tenderness (diffuse). There is no rebound and no guarding. No hernia.  No palpable hernias. Hypersensitivity.  Musculoskeletal: Normal range of motion.  Neurological: She is alert and oriented to person, place, and time.  Skin: Skin is warm and dry.  Psychiatric: She has a normal mood and affect. Her behavior is normal.  Nursing note and vitals reviewed.  BP 124/86   Pulse (!) 115   Temp 97.7 F (36.5 C) (Oral)   Resp 18   Ht 5\' 2"  (1.575 m)   Wt 263 lb 9.6 oz (119.6 kg)   SpO2 93%   BMI 48.21 kg/m  Assessment & Plan:   1. Idiopathic acute pancreatitis without infection or necrosis   2. Fibromyalgia   3. Chronic pain syndrome    Pt still can't even sit up for more than a few minutes - spends most of her time lying flat due to abd pain from pancreatitis. Eating well on low fat diet and bowels improved with increased creon dose.  Goals of trying to get into her car - try once  a day - is currently homebound - father has to bring her to grocery once a week and occ doctor's visit but otherwise isolated. No avail for exercise at her house or around street/neighborhood but needs to try to find some way to gradually increase tolerance so she can go back to work in another  month (has only used 1 mo of her FMLA thus far.) Recheck in 2 wks. Increase citalopram form 20 to 40.  Meds ordered this encounter  Medications  . citalopram (CELEXA) 40 MG tablet    Sig: Take 1 tablet (40 mg total) by mouth daily.    Dispense:  90 tablet    Refill:  0  . pregabalin (LYRICA) 150 MG capsule    Sig: Take 1 capsule (150 mg total) by mouth 3 (three) times daily.    Dispense:  270 capsule    Refill:  0    Please fax to [facility] on [address] F: [phone]  . carisoprodol (SOMA) 350 MG tablet    Sig: Take 1 tablet (350 mg total) by mouth 4 (four) times daily as needed for muscle spasms.    Dispense:  120 tablet    Refill:  2    Please fax to [facility] on [address] F: [phone]  . oxyCODONE (OXY IR/ROXICODONE) 5 MG immediate release tablet    Sig: Take 1-2 tablets (5-10 mg total) by mouth every 4 (four) hours as needed for severe pain.    Dispense:  60 tablet    Refill:  0    I personally performed the services described in this documentation, which was scribed in my presence. The recorded information has been reviewed and considered, and addended by me as needed.   Norberto Sorenson, M.D.  Primary Care at [facility] [address] [place], [place] [address] [phone] phone [phone] fax  06/04/17 12:31 AM

## 2017-06-02 ENCOUNTER — Ambulatory Visit: Payer: Self-pay | Admitting: Family Medicine

## 2017-06-06 DIAGNOSIS — Z0271 Encounter for disability determination: Secondary | ICD-10-CM

## 2017-06-10 LAB — COMPREHENSIVE METABOLIC PANEL
A/G RATIO: 1.4 (ref 1.2–2.2)
ALT: 19 IU/L (ref 0–32)
AST: 23 IU/L (ref 0–40)
Albumin: 4 g/dL (ref 3.5–5.5)
Alkaline Phosphatase: 121 IU/L — ABNORMAL HIGH (ref 39–117)
BUN/Creatinine Ratio: 17 (ref 9–23)
BUN: 11 mg/dL (ref 6–24)
Bilirubin Total: 0.4 mg/dL (ref 0.0–1.2)
CALCIUM: 9.3 mg/dL (ref 8.7–10.2)
CHLORIDE: 100 mmol/L (ref 96–106)
CO2: 23 mmol/L (ref 20–29)
Creatinine, Ser: 0.66 mg/dL (ref 0.57–1.00)
GFR calc Af Amer: 117 mL/min/{1.73_m2} (ref >59)
GFR, EST NON AFRICAN AMERICAN: 101 mL/min/{1.73_m2} (ref >59)
Globulin, Total: 2.9 g/dL (ref 1.5–4.5)
Glucose: 118 mg/dL — ABNORMAL HIGH (ref 65–99)
POTASSIUM: 3.8 mmol/L (ref 3.5–5.2)
Sodium: 140 mmol/L (ref 134–144)
Total Protein: 6.9 g/dL (ref 6.0–8.5)

## 2017-06-10 LAB — SPECIMEN STATUS REPORT

## 2017-06-15 ENCOUNTER — Encounter: Payer: Self-pay | Admitting: Family Medicine

## 2017-06-15 ENCOUNTER — Telehealth: Payer: Self-pay | Admitting: Family Medicine

## 2017-06-15 ENCOUNTER — Ambulatory Visit (INDEPENDENT_AMBULATORY_CARE_PROVIDER_SITE_OTHER): Payer: 59 | Admitting: Family Medicine

## 2017-06-15 VITALS — BP 133/86 | HR 102 | Temp 98.4°F | Resp 18 | Ht 62.0 in | Wt 262.2 lb

## 2017-06-15 DIAGNOSIS — R739 Hyperglycemia, unspecified: Secondary | ICD-10-CM

## 2017-06-15 DIAGNOSIS — E039 Hypothyroidism, unspecified: Secondary | ICD-10-CM | POA: Diagnosis not present

## 2017-06-15 DIAGNOSIS — R5381 Other malaise: Secondary | ICD-10-CM | POA: Diagnosis not present

## 2017-06-15 DIAGNOSIS — M797 Fibromyalgia: Secondary | ICD-10-CM

## 2017-06-15 DIAGNOSIS — R1013 Epigastric pain: Secondary | ICD-10-CM | POA: Diagnosis not present

## 2017-06-15 DIAGNOSIS — K8500 Idiopathic acute pancreatitis without necrosis or infection: Secondary | ICD-10-CM

## 2017-06-15 DIAGNOSIS — Z6841 Body Mass Index (BMI) 40.0 and over, adult: Secondary | ICD-10-CM

## 2017-06-15 DIAGNOSIS — R3121 Asymptomatic microscopic hematuria: Secondary | ICD-10-CM

## 2017-06-15 LAB — POCT URINALYSIS DIP (MANUAL ENTRY)
BILIRUBIN UA: NEGATIVE
Glucose, UA: NEGATIVE mg/dL
Ketones, POC UA: NEGATIVE mg/dL
NITRITE UA: NEGATIVE
PH UA: 7 (ref 5.0–8.0)
PROTEIN UA: NEGATIVE mg/dL
Spec Grav, UA: 1.01 (ref 1.010–1.025)
UROBILINOGEN UA: 0.2 U/dL

## 2017-06-15 NOTE — Patient Instructions (Addendum)
IF you received an x-ray today, you will receive an invoice from [facility]. Please contact [facility] at [phone] with questions or concerns regarding your invoice.   IF you received labwork today, you will receive an invoice from [facility]. Please contact [facility] at [phone] with questions or concerns regarding your invoice.   Our billing staff will not be able to assist you with questions regarding bills from these companies.  You will be contacted with the lab results as soon as they are available. The fastest way to get your results is to activate your My Chart account. Instructions are located on the last page of this paperwork. If you have not heard from us regarding the results in 2 weeks, please contact this office.      Deconditioning Deconditioning refers to the changes in your body that occur during a period of inactivity. The changes happen in your heart, lungs, and muscles. They decrease your ability to be active, and they make you feel tired and weak. There are three stages of deconditioning:  Mild deconditioning. At this stage, you will notice a change in your ability to do your usual exercise activities, such as running, biking, or swimming.  Moderate deconditioning. At this stage, you will notice a change in your ability to do normal everyday activities, such as walking, grocery shopping, and doing chores.  Severe deconditioning. At this stage, you will notice a change in your ability to do minimal activity or normal self-care.  Deconditioning can occur after only a few days of inactivity. The longer the period of inactivity, the more severe the deconditioning will be, and the longer it will take to return to your previous level of functioning. What are the causes? Deconditioning is often caused by inactivity due to:  Illnesses, such as cancer, stroke, heart attack, fibromyalgia, and chronic fatigue syndrome.  Injuries, especially back  injuries, broken bones, and ligament and tendon injuries.  A long stay in the hospital.  Pregnancy, especially if long periods of bed rest are needed.  What increases the risk? This condition is more likely to develop in:  People who are hospitalized.  People on bed rest.  People who are obese.  People with poor nutrition.  Elderly adults.  People with injuries or illnesses that interfere with movement and activity.  What are the signs or symptoms? Symptoms of deconditioning include:  Weakness.  Tiredness.  Shortness of breath with minor exertion.  A faster-than-normal heartbeat. You may not notice this without taking your pulse.  Pain or discomfort with activity.  Decreased strength.  Decreased sense of balance.  Decreased endurance.  Difficulty doing your usual forms of exercise.  Difficulty doing activities of daily living, such as grocery shopping or chores.  Difficulty walking around the house and doing basic self-care, such as getting to the bathroom, preparing meals, or doing laundry.  How is this diagnosed? Deconditioning is diagnosed based on your medical history and a physical exam. During the physical exam, your health care provider will check for signs of deconditioning, such as:  Decreased size of muscles.  Decreased strength.  Trouble with balance.  Shortness of breath or abnormally increased heart rate after minor exertion.  How is this treated? Treatment for deconditioning usually involves following a structured exercise program in which activity is increased gradually. Your health care provider will determine which exercises are right for you. The exercise program will likely include aerobic exercise and strength training:  Aerobic exercise helps improve the functioning  of the heart and lungs as well as the muscles.  Strength training helps improve muscle size and strength.  Both of these types of exercise will improve your endurance.  You may be referred to a physical therapist who can create a safe strengthening program for you to follow. Follow these instructions at home:  Follow the exercise program that is recommended by your health care provider or physical therapist.  Do not increase your exercise any faster than directed.  Eat a healthy diet.  Do not use any products that contain nicotine or tobacco, such as cigarettes and e-cigarettes. If you need help quitting, ask your health care provider.  Take over-the-counter and prescription medicines only as told by your health care provider.  Keep all follow-up visits as told by your health care provider. This is important. Contact a health care provider if:  You are not able to carry out the prescribed exercise program.  You are becoming more and more fatigued and weak.  You become light-headed when rising to a sitting or standing position.  Your level of endurance decreases after it has improved. Get help right away if:  You have chest pain.  You are very short of breath.  You have any episodes of passing out. This information is not intended to replace advice given to you by your health care provider. Make sure you discuss any questions you have with your health care provider. Document Released: 02/11/2014 Document Revised: 04/16/2016 Document Reviewed: 12/27/2015 Elsevier Interactive Patient Education  2018 [employer].  How to Increase Your Level of Physical Activity Getting regular physical activity is important for your overall health and well-being. Most people do not get enough exercise. There are easy ways to increase your level of physical activity, even if you have not been very active in the past or you are just starting out. Why is physical activity important? Physical activity has many short-term and long-term health benefits. Regular exercise can:  Help you lose weight or maintain a healthy weight.  Strengthen your muscles and  bones.  Boost your mood and improve self-esteem.  Reduce your risk of certain long-term (chronic) diseases, like heart disease, cancer, and diabetes.  Help you stay capable of walking and moving around (mobile) as you age.  Prevent accidents, such as falls, as you age.  Increase life expectancy.  What are the benefits of being physically active on a regular basis? In addition to improving your physical health, being physically active on most days of the week can help you in ways that you may not expect. Benefits of regular physical activity may include:  Feeling good about your body.  Being able to move around more easily and for longer periods of time without getting tired (increased stamina).  Finding new sources of fun and enjoyment.  Meeting new people who share a common interest.  Being able to fight off illness better (enhanced immunity).  Being able to sleep better.  What can happen if I am not physically active on a regular basis? Not getting enough physical activity can lead to an unhealthy lifestyle and future health problems. This can increase your chances of:  Becoming overweight or obese.  Becoming sick.  Developing chronic illnesses, like heart disease or diabetes.  Having mental health problems, like depression or anxiety.  Having sleep problems.  Having trouble walking or getting yourself around (reduced mobility).  Injuring yourself in a fall as you get older.  What steps can I take to be more physically  active?  Check with your health care provider about how to get started. Ask your health care provider what activities are safe for you.  Start out slowly. Walking or doing some simple chair exercises is a good place to start, especially if you have not been active before or for a long time.  Try to find activities that you enjoy. You are more likely to commit to an exercise routine if it does not feel like a chore.  If you have bone or joint  problems, choose low-impact exercises, like walking or swimming.  Include physical activity in your everyday routine.  Invite friends or family members to exercise with you. This also will help you commit to your workout plan.  Set goals that you can work toward.  Aim for at least 150 minutes of moderate-intensity exercise each week. Examples of moderate-intensity exercise include walking or riding a bike. Where to find more information:  Centers for Disease Control and Prevention: JokeRule.co.uk  President's Council on [employer] www.http://smith-thompson.com/  ChooseMyPlate: MissedFlights.com.br Contact a health care provider if:  You have headaches, muscle aches, or joint pain.  You feel dizzy or light-headed while exercising.  You faint.  You have chest pain while exercising. Summary  Exercise benefits your mind and body at any age, even if you are just starting out.  If you have a chronic illness or have not been active for a while, check with your health care provider before increasing your physical activity.  Choose activities that are safe and enjoyable for you.Ask your health care provider what activities are safe for you.  Start slowly. Tell your health care provider if you have problems as you start to increase your activity level. This information is not intended to replace advice given to you by your health care provider. Make sure you discuss any questions you have with your health care provider. Document Released: 09/16/2016 Document Revised: 09/16/2016 Document Reviewed: 09/16/2016 Elsevier Interactive Patient Education  2018 [employer].  Sit-to-Stand Exercise The sit-to-stand exercise (also known as the chair stand or chair rise exercise) strengthens your lower body and helps you maintain or improve your mobility and independence. The goal is to do the sit-to-stand exercise without using your  hands. This will be easier as you become stronger. You should always talk with your health care provider before starting any exercise program, especially if you have had recent surgery. Do the exercise exactly as told by your health care provider and adjust it as directed. It is normal to feel mild stretching, pulling, tightness, or discomfort as you do this exercise, but you should stop right away if you feel sudden pain or your pain gets worse. Do not begin doing this exercise until told by your health care provider. What the sit-to-stand exercise does The sit-to-stand exercise helps to strengthen the muscles in your thighs and the muscles in the center of your body that give you stability (core muscles). This exercise is especially helpful if:  You have had knee or hip surgery.  You have trouble getting up from a chair, out of a car, or off the toilet.  How to do the sit-to-stand exercise 1. Sit toward the front edge of a sturdy chair without armrests. Your knees should be bent and your feet should be flat on the floor and shoulder-width apart. 2. Place your hands lightly on each side of the seat. Keep your back and neck as straight as possible, with your chest slightly forward. 3. Breathe  in slowly. Lean forward and slightly shift your weight to the front of your feet. 4. Breathe out as you slowly stand up. Use your hands as little as possible. 5. Stand and pause for a full breath in and out. 6. Breathe in as you sit down slowly. Tighten your core and abdominal muscles to control your lowering as much as possible. 7. Breathe out slowly. 8. Do this exercise 10-15 times. If needed, do it fewer times until you build up strength. 9. Rest for 1 minute, then do another set of 10-15 repetitions. To change the difficulty of the sit-to-stand exercise  If the exercise is too difficult, use a chair with sturdy armrests, and push off the armrests to help you come to the standing position. You can also  use the armrests to help slowly lower yourself back to sitting. As this gets easier, try to use your arms less. You can also place a firm cushion or pillow on the chair to make the surface higher.  If this exercise is too easy, do not use your arms to help raise or lower yourself. You can also wear a weighted vest, use hand weights, increase your repetitions, or try a lower chair. General tips  You may feel tired when starting an exercise routine. This is normal.  You may have muscle soreness that lasts a few days. This is normal. As you get stronger, you may not feel muscle soreness.  Use smooth, steady movements.  Do not  hold your breath during strength exercises. This can cause unsafe changes in your blood pressure.  Breathe in slowly through your nose, and breathe out slowly through your mouth. Summary  Strengthening your lower body is an important step to help you move safely and independently.  The sit-to-stand exercise helps strengthen the muscles in your thighs and core.  You should always talk with your health care provider before starting any exercise program, especially if you have had recent surgery. This information is not intended to replace advice given to you by your health care provider. Make sure you discuss any questions you have with your health care provider. Document Released: 11/18/2016 Document Revised: 11/18/2016 Document Reviewed: 11/18/2016 Elsevier Interactive Patient Education  2018 [employer].

## 2017-06-15 NOTE — Progress Notes (Addendum)
Subjective:  By signing my name below, I, Stann Oresung-Kai Tsai, attest that this documentation has been prepared under the direction and in the presence of Norberto SorensonEva Shaw, MD. Electronically Signed: Stann Oresung-Kai Tsai, Scribe. 06/15/2017 , 5:10 PM .  Patient was seen in Room 2 .   Patient ID: Gloria Carter, female    DOB: Sep 15, 1966, 51 y.o.   MRN: [id number] Chief Complaint  Patient presents with  . Pancreatitis  . Follow-up   HPI Gloria Carter is a 51 y.o. female who presents to [facility] at [facility] for follow up. She also presents with paperwork for disability. Patient was hospitalized on 7/13, and first day she missed work was 04/19/17.   Patient states she's been doing better in the last 2 weeks, but still feeling fatigue and lethargic, as well as having upper abdominal pain. She has trouble getting in her own car, as it's too low, but has been able to get into her parents' car. She takes oxycodone about once a day. Overall, her mood has been okay.   She reports eating and then laying down, as it's hard to sit up straight due to feeling uncomfortable. She believes it's not eating that causes the pain, but just when stomach is full, as it is the same if she drinks a lot of water. She mentions 1 bowel movement a day; denies constipation or hard stools. She denies urinary symptoms; nocturia x1 a night. She takes Zofran occasionally if she feels nauseated, about once every 3-4 days.   She is taking creon, 4 with each meal, averaging 2 meals a day. She's also taking creon with snacks. She was walking outside yesterday afternoon, on her patio with her cane, which was somewhat more difficult. She was able to walk around [employer] and [employer] 30 minutes each with her father 3 days ago. She's been trying to be active around her house with some chores, up to about 2-3 hours intermittently throughout the day.   Past Medical History:  Diagnosis Date  . ADD (attention deficit disorder)   . Allergy    ENVIRONMENTAL  . Arthritis    knees  . Asthma   . Depression   . Difficulty sleeping   . Dysphagia   . Fibromyalgia   . GERD (gastroesophageal reflux disease)   . Hernia of abdominal wall   . Hyperlipidemia   . Hypertension   . IBS (irritable bowel syndrome)   . Migraine   . Numbness and tingling in hands   . Obesity   . Swelling of both ankles   . Thyroid disease    Prior to Admission medications   Medication Sig Start Date End Date Taking? Authorizing Provider  albuterol (PROVENTIL HFA) 108 (90 BASE) MCG/ACT inhaler INHALE TWO PUFFS BY MOUTH EVERY SIX HOURS AS NEEDED Patient taking differently: Inhale 2 puffs into the lungs every 6 (six) hours as needed for wheezing or shortness of breath. INHALE TWO PUFFS BY MOUTH EVERY SIX HOURS AS NEEDED 08/21/15   Sherren MochaShaw, Eva N, MD  amphetamine-dextroamphetamine (ADDERALL) 10 MG tablet Take 1 tablet (10 mg total) by mouth 2 (two) times daily. 01/17/17   Sherren MochaShaw, Eva N, MD  azelastine (OPTIVAR) 0.05 % ophthalmic solution PLACE TWO DRIPS INTO BOTH EYES TWICE DAILY Patient taking differently: PLACE TWO DRIPS INTO BOTH EYES TWICE DAILY FOR DRY EYES 09/04/16   Sherren MochaShaw, Eva N, MD  B Complex-C-Folic Acid (B-COMPLEX BALANCED PO) Take 1 tablet by mouth daily.     [provider]  calcium  carbonate (OS-CAL) 600 MG TABS Take 600 mg by mouth daily.    [provider]  carisoprodol (SOMA) 350 MG tablet Take 1 tablet (350 mg total) by mouth 4 (four) times daily as needed for muscle spasms. 06/01/17   Sherren Mocha, MD  cetirizine (ZYRTEC) 10 MG tablet Take 10 mg by mouth daily as needed for allergies.     [provider]  cholecalciferol (VITAMIN D) 400 UNITS TABS Take 400 Units by mouth daily.    [provider]  citalopram (CELEXA) 40 MG tablet Take 1 tablet (40 mg total) by mouth daily. 06/01/17   Sherren Mocha, MD  dicyclomine (BENTYL) 20 MG tablet Take 1 tablet (20 mg total) by mouth 3 (three) times daily before meals. As needed for  bowel cramping 06/24/16   Sherren Mocha, MD  esomeprazole (NEXIUM) 40 MG capsule Take 1 capsule (40 mg total) by mouth daily. 05/18/17   Sherren Mocha, MD  Fluticasone-Salmeterol (ADVAIR DISKUS) 500-50 MCG/DOSE AEPB Inhale 1 puff into the lungs 2 (two) times daily. 05/18/17   Sherren Mocha, MD  levothyroxine (SYNTHROID) 75 MCG tablet Take 1 tablet (75 mcg total) by mouth daily before breakfast. 05/18/17   Sherren Mocha, MD  lipase/protease/amylase (CREON) 36000 UNITS CPEP capsule Take 1 capsule (36,000 Units total) by mouth as directed. 4 capsules with a meal And 1 capsule with snacks 05/24/17   Unk Lightning, PA  montelukast (SINGULAIR) 10 MG tablet Take 1 tablet (10 mg total) by mouth every morning. 06/24/16   Sherren Mocha, MD  Multiple Vitamin (MULTIVITAMIN) tablet Take 1 tablet by mouth daily.    [provider]  ondansetron (ZOFRAN) 4 MG tablet Take 1 tablet (4 mg total) by mouth every 6 (six) hours. Patient taking differently: Take 4 mg by mouth every 8 (eight) hours as needed for nausea or vomiting.  06/24/16   Sherren Mocha, MD  oxyCODONE (OXY IR/ROXICODONE) 5 MG immediate release tablet Take 1-2 tablets (5-10 mg total) by mouth every 4 (four) hours as needed for severe pain. 06/01/17   Sherren Mocha, MD  Polyethyl Glycol-Propyl Glycol (SYSTANE) 0.4-0.3 % SOLN Apply 1 drop to eye 3 (three) times daily as needed (dry eyes).    [provider]  pregabalin (LYRICA) 150 MG capsule Take 1 capsule (150 mg total) by mouth 3 (three) times daily. 06/01/17   Sherren Mocha, MD  ranitidine (ZANTAC) 150 MG tablet Take 150 mg by mouth 2 (two) times daily as needed for heartburn.    [provider]  SUMAtriptan (IMITREX) 100 MG tablet Take 1 tablet (100 mg total) by mouth every 2 (two) hours as needed for migraine. No more than 2 doses in 24 hours. 05/18/17   Sherren Mocha, MD  telmisartan (MICARDIS) 80 MG tablet Take 1 tablet (80 mg total) by mouth daily. 05/18/17   Sherren Mocha, MD  vitamin C (ASCORBIC  ACID) 500 MG tablet Take 1,000 mg by mouth daily.    [provider]   Allergies  Allergen Reactions  . Soap     Any soaps with fragrance cause migraines  . Sulfa Antibiotics Nausea And Vomiting   Past Surgical History:  Procedure Laterality Date  . INSERTION OF MESH N/A 03/28/2014   Procedure: INSERTION OF MESH;  Surgeon: Velora Heckler, MD;  Location: [facility];  Service: General;  Laterality: N/A;  . TONSILECTOMY/ADENOIDECTOMY WITH MYRINGOTOMY    . VENTRAL HERNIA REPAIR N/A 03/28/2014  Procedure: LAPAROSCOPIC VENTRAL HERNIA REPAIR WITH MESH;  Surgeon: Velora Heckler, MD;  Location: [facility];  Service: General;  Laterality: N/A;  . WISDOM TOOTH EXTRACTION     Family History  Problem Relation Age of Onset  . COPD Mother   . Fibromyalgia Mother   . Heart disease Father   . Colon polyps Father   . Stroke Sister   . Fibromyalgia Sister   . Colon polyps Maternal Grandmother   . Lung disease Paternal Grandmother   . Stroke Paternal Grandmother   . Diabetes Paternal Grandfather   . Heart disease Paternal Grandfather   . Diabetes Maternal Uncle   . Colon polyps Maternal Aunt   . Aortic aneurysm Maternal Aunt   . Cystic kidney disease Maternal Aunt        kidney cyst  . Kidney disease Maternal Aunt   . Fibromyalgia Unknown        cousin  . Colon cancer Neg Hx    Social History   Social History  . Marital status: Legally Separated    Spouse name: N/A  . Number of children: 0  . Years of education: N/A   Occupational History  . 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 Smoking status: Never Smoker  . Smokeless tobacco: Never Used  . Alcohol use No  . Drug use: No  . Sexual activity: Yes   Other Topics Concern  . None   Social History Narrative   Married. Education: [employer].    Depression screen [facility] 2/9 06/15/2017 06/01/2017 05/18/2017 05/16/2017 05/04/2017  Decreased Interest 0 0 0 0 0  Down, Depressed, Hopeless 0 0 0 0 0  PHQ - 2 Score 0 0 0 0 0     Review of Systems  Constitutional: Positive for activity change and fatigue. Negative for appetite change, chills, fever and unexpected weight change.  Respiratory: Negative for chest tightness and shortness of breath.   Cardiovascular: Negative for chest pain, palpitations and leg swelling.  Gastrointestinal: Positive for abdominal pain. Negative for blood in stool and constipation.  Genitourinary: Negative for dysuria, frequency and urgency.  Musculoskeletal: Positive for arthralgias, back pain, gait problem and myalgias. Negative for joint swelling.  Allergic/Immunologic: Negative for immunocompromised state.  Neurological: Positive for weakness. Negative for dizziness, syncope, light-headedness and headaches.  Psychiatric/Behavioral: Positive for dysphoric mood. Negative for agitation and confusion. The patient is not nervous/anxious.   Has abd pain with abducting/flexing arms over shoulders.    Objective:   Physical Exam  Constitutional: She is oriented to person, place, and time. She appears well-developed and well-nourished. No distress.  Laying on exam bed during entire time pt is in room.  Cannot sit up due to pain - only with assistance for as short as time as poss. Walker next to bed  HENT:  Head: Normocephalic and atraumatic.  Eyes: Pupils are equal, round, and reactive to light. EOM are normal.  Neck: Neck supple.  Cardiovascular: Regular rhythm and normal heart sounds.  Tachycardia present.   Pulmonary/Chest: Effort normal and breath sounds normal. No respiratory distress.  Abdominal: Soft. Bowel sounds are normal. She exhibits no distension, no ascites, no pulsatile midline mass and no mass. There is no hepatosplenomegaly (cannot truly assess secondary to body habitus). There is generalized tenderness (significantly worse in epigastrium and LUQ, no referred pain.). There is no rigidity, no rebound, no guarding and no CVA tenderness.  Pt wearing abdominal binder for back  pain - elastic wrap.  Musculoskeletal: Normal range of motion.  Neurological: She is alert and oriented to person, place, and time.  Skin: Skin is warm and dry.  Psychiatric: Her speech is normal. She exhibits a depressed mood.  tearful  Nursing note and vitals reviewed.   BP 133/86   Pulse (!) 102   Temp 98.4 F (36.9 C) (Oral)   Resp 18   Ht 5\' 2"  (1.575 m)   Wt 262 lb 3.2 oz (118.9 kg)   SpO2 93%   BMI 47.96 kg/m      Assessment & Plan:   1. Idiopathic acute pancreatitis without infection or necrosis - still with sig pain limiting all activity. Taking creon. F/u with GI is 10/23 Dr. Christella Hartigan. Last labs and CT nml.  2. Abdominal pain, epigastric   3. Physical deconditioning - Disability forms for pt's ins completed during OV.  Pt has used 2/3 of her allotted FMLA time for the year and is still having such sig sxs - 2 mos out from the flair of acute (now on chronic) pancreatitis that she cannot sit up after eating for sev hr, cannot be upright for >30 min at a time, and is still homebound - cannot get into her car.  She can get into her parents car WITH ASSISTANCE so clearly no way she can return to work - the only activity she can tolerate is a once weekly 1 hr trip to the grocery store where her dad helps her transfer, drives, and lifts most things for her. Her only "exericise" she was able to tolerate in the past 2 wks was walking the outside perimeter of her house once with a walker. For these reasons, discussed w/ pt that I am very concerned that pt will not physically be able to tolerate a return to work in another month when her 12 wks of FMLA runs out. First day out of work was 7/10 so 12 wks of FMLA will expire on 07/12/2017. However, pt states she is hopeful as she feels like she has had much more sig improvement in sxs than I have appreciated by her appearance and report in the office (always lying on the bed, can't sit in the chair, needs assistance for position change, brought  in by her mother). Pt agrees to recheck in 10d as I am hopeful she might be able to RTW part-time - either half days or a few days a week for several week initially to gradually build back her strength while she still has some FMLA time avail to do this.  4. Hypothyroidism, unspecified type - cont levothyroxine - refill x 6 mos when requested in Nov  5. Elevated blood sugar - a1c nml - preDM resolved!!!!!   6.      Asymptomatic microscopic hematuria - confirm w/ micro at next OV. H/o? 7.      Morbid obesity - lost 12 lbs during illness - encouraged to be sure not to gain it back - will help is she can resume physical activity. 8.       Fibromyalgia - perhaps causing worse than expected sxs which is consistent w/ all pt's prior episodes of pancreatitis over past yr, none of which were near as severe as this.  Orders Placed This Encounter  Procedures  . Comprehensive metabolic panel  . Lipase  . CBC with Differential/Platelet  . TSH  . Hemoglobin A1c  . POCT urinalysis dipstick   Over 40 min spent in face-to-face evaluation of and consultation with patient and  coordination of care.  Over 50% of this time was spent counseling this patient regarding physical deconditioning, depression from stress of chronic illness and homebound isolation, importance of pushing herself through pain into recovery, interplay of fibromyalgia likely worsening and prolonging the expected recovery considering nml objective data.  I personally performed the services described in this documentation, which was scribed in my presence. The recorded information has been reviewed and considered, and addended by me as needed.   Norberto Sorenson, M.D.  Primary Care at [facility] [address] [place], [place] [address] [phone] phone [phone] fax  06/19/17 12:41 AM  Results for orders placed or performed in visit on 06/15/17  Comprehensive metabolic panel  Result Value Ref Range   Glucose 93 65 - 99 mg/dL     BUN 10 6 - 24 mg/dL   Creatinine, Ser 5.28 (L) 0.57 - 1.00 mg/dL   GFR calc non Af Amer 107 >59 mL/min/1.73   GFR calc Af Amer 123 >59 mL/min/1.73   BUN/Creatinine Ratio 18 9 - 23   Sodium 141 134 - 144 mmol/L   Potassium 3.2 (L) 3.5 - 5.2 mmol/L   Chloride 98 96 - 106 mmol/L   CO2 31 (H) 20 - 29 mmol/L   Calcium 9.0 8.7 - 10.2 mg/dL   Total Protein 6.5 6.0 - 8.5 g/dL   Albumin 3.8 3.5 - 5.5 g/dL   Globulin, Total 2.7 1.5 - 4.5 g/dL   Albumin/Globulin Ratio 1.4 1.2 - 2.2   Bilirubin Total 0.4 0.0 - 1.2 mg/dL   Alkaline Phosphatase 98 39 - 117 IU/L   AST 17 0 - 40 IU/L   ALT 14 0 - 32 IU/L  Lipase  Result Value Ref Range   Lipase 14 14 - 72 U/L  CBC with Differential/Platelet  Result Value Ref Range   WBC 7.4 3.4 - 10.8 x10E3/uL   RBC 4.34 3.77 - 5.28 x10E6/uL   Hemoglobin 12.8 11.1 - 15.9 g/dL   Hematocrit 41.3 24.4 - 46.6 %   MCV 88 79 - 97 fL   MCH 29.5 26.6 - 33.0 pg   MCHC 33.5 31.5 - 35.7 g/dL   RDW 01.0 27.2 - 53.6 %   Platelets 216 150 - 379 x10E3/uL   Neutrophils 60 Not Estab. %   Lymphs 29 Not Estab. %   Monocytes 8 Not Estab. %   Eos 2 Not Estab. %   Basos 1 Not Estab. %   Neutrophils Absolute 4.5 1.4 - 7.0 x10E3/uL   Lymphocytes Absolute 2.1 0.7 - 3.1 x10E3/uL   Monocytes Absolute 0.6 0.1 - 0.9 x10E3/uL   EOS (ABSOLUTE) 0.2 0.0 - 0.4 x10E3/uL   Basophils Absolute 0.0 0.0 - 0.2 x10E3/uL   Immature Granulocytes 0 Not Estab. %   Immature Grans (Abs) 0.0 0.0 - 0.1 x10E3/uL  TSH  Result Value Ref Range   TSH 0.805 0.450 - 4.500 uIU/mL  Hemoglobin A1c  Result Value Ref Range   Hgb A1c MFr Bld 5.6 4.8 - 5.6 %   Est. average glucose Bld gHb Est-mCnc 114 mg/dL  POCT urinalysis dipstick  Result Value Ref Range   Color, UA yellow yellow   Clarity, UA clear clear   Glucose, UA negative negative mg/dL   Bilirubin, UA negative negative   Ketones, POC UA negative negative mg/dL   Spec Grav, UA 6.440 3.474 - 1.025   Blood, UA small (A) negative   pH, UA 7.0  5.0 - 8.0   Protein  Ur, POC negative negative mg/dL   Urobilinogen, UA 0.2 0.2 or 1.0 E.U./dL   Nitrite, UA Negative Negative   Leukocytes, UA Small (1+) (A) Negative

## 2017-06-15 NOTE — Telephone Encounter (Signed)
[place] needs attending provider statement filled out by Dr Clelia CroftShaw for this patients condition that is keeping her out of work. I was not sure how to complete this so I did not fill anything in. I will place the forms in your box on 06/15/17 please return to the FMLA/Disability box at the 102 checkout desk within 5-7 business days. Thank you!

## 2017-06-16 LAB — COMPREHENSIVE METABOLIC PANEL
A/G RATIO: 1.4 (ref 1.2–2.2)
ALT: 14 IU/L (ref 0–32)
AST: 17 IU/L (ref 0–40)
Albumin: 3.8 g/dL (ref 3.5–5.5)
Alkaline Phosphatase: 98 IU/L (ref 39–117)
BUN / CREAT RATIO: 18 (ref 9–23)
BUN: 10 mg/dL (ref 6–24)
Bilirubin Total: 0.4 mg/dL (ref 0.0–1.2)
CALCIUM: 9 mg/dL (ref 8.7–10.2)
CO2: 31 mmol/L — ABNORMAL HIGH (ref 20–29)
CREATININE: 0.56 mg/dL — AB (ref 0.57–1.00)
Chloride: 98 mmol/L (ref 96–106)
GFR, EST AFRICAN AMERICAN: 123 mL/min/{1.73_m2} (ref >59)
GFR, EST NON AFRICAN AMERICAN: 107 mL/min/{1.73_m2} (ref >59)
GLOBULIN, TOTAL: 2.7 g/dL (ref 1.5–4.5)
Glucose: 93 mg/dL (ref 65–99)
POTASSIUM: 3.2 mmol/L — AB (ref 3.5–5.2)
SODIUM: 141 mmol/L (ref 134–144)
TOTAL PROTEIN: 6.5 g/dL (ref 6.0–8.5)

## 2017-06-16 LAB — CBC WITH DIFFERENTIAL/PLATELET
Basophils Absolute: 0 10*3/uL (ref 0.0–0.2)
Basos: 1 %
EOS (ABSOLUTE): 0.2 10*3/uL (ref 0.0–0.4)
Eos: 2 %
HEMATOCRIT: 38.2 % (ref 34.0–46.6)
HEMOGLOBIN: 12.8 g/dL (ref 11.1–15.9)
Immature Grans (Abs): 0 10*3/uL (ref 0.0–0.1)
Immature Granulocytes: 0 %
LYMPHS ABS: 2.1 10*3/uL (ref 0.7–3.1)
Lymphs: 29 %
MCH: 29.5 pg (ref 26.6–33.0)
MCHC: 33.5 g/dL (ref 31.5–35.7)
MCV: 88 fL (ref 79–97)
MONOCYTES: 8 %
Monocytes Absolute: 0.6 10*3/uL (ref 0.1–0.9)
NEUTROS ABS: 4.5 10*3/uL (ref 1.4–7.0)
Neutrophils: 60 %
Platelets: 216 10*3/uL (ref 150–379)
RBC: 4.34 x10E6/uL (ref 3.77–5.28)
RDW: 13.9 % (ref 12.3–15.4)
WBC: 7.4 10*3/uL (ref 3.4–10.8)

## 2017-06-16 LAB — LIPASE: Lipase: 14 U/L (ref 14–72)

## 2017-06-16 LAB — TSH: TSH: 0.805 u[IU]/mL (ref 0.450–4.500)

## 2017-06-16 LAB — HEMOGLOBIN A1C
Est. average glucose Bld gHb Est-mCnc: 114 mg/dL
HEMOGLOBIN A1C: 5.6 % (ref 4.8–5.6)

## 2017-06-23 ENCOUNTER — Telehealth: Payer: Self-pay | Admitting: Family Medicine

## 2017-06-23 NOTE — Telephone Encounter (Signed)
Perfect. Thanks.

## 2017-06-23 NOTE — Telephone Encounter (Signed)
Today is the 6th business days have these forms been completed as of yet?  Please let me know thank you!

## 2017-06-23 NOTE — Telephone Encounter (Signed)
[facility] sent fax stating they were unable to contact the pt concerning referral after several attempts. They did state pt's insurance is not in network with them for nutrition, but were unable to discuss this with the pt. I have switched the referral over to [facility] and will see if they are able to contact the pt to schedule. Thanks!

## 2017-06-25 ENCOUNTER — Other Ambulatory Visit: Payer: Self-pay | Admitting: Family Medicine

## 2017-06-25 ENCOUNTER — Encounter: Payer: Self-pay | Admitting: Family Medicine

## 2017-06-25 NOTE — Telephone Encounter (Signed)
hctz was discontinued 2/2 pancreatitis - potential etiology. Cont on telmisartan alone.

## 2017-06-27 ENCOUNTER — Encounter: Payer: Self-pay | Admitting: Family Medicine

## 2017-06-27 ENCOUNTER — Ambulatory Visit (INDEPENDENT_AMBULATORY_CARE_PROVIDER_SITE_OTHER): Payer: 59 | Admitting: Family Medicine

## 2017-06-27 VITALS — BP 119/78 | HR 92 | Temp 98.0°F | Resp 18 | Ht 62.0 in | Wt 267.8 lb

## 2017-06-27 DIAGNOSIS — R1013 Epigastric pain: Secondary | ICD-10-CM | POA: Diagnosis not present

## 2017-06-27 DIAGNOSIS — R5381 Other malaise: Secondary | ICD-10-CM | POA: Diagnosis not present

## 2017-06-27 DIAGNOSIS — Z79899 Other long term (current) drug therapy: Secondary | ICD-10-CM

## 2017-06-27 DIAGNOSIS — M797 Fibromyalgia: Secondary | ICD-10-CM | POA: Diagnosis not present

## 2017-06-27 DIAGNOSIS — R600 Localized edema: Secondary | ICD-10-CM | POA: Diagnosis not present

## 2017-06-27 DIAGNOSIS — K8500 Idiopathic acute pancreatitis without necrosis or infection: Secondary | ICD-10-CM | POA: Diagnosis not present

## 2017-06-27 DIAGNOSIS — G894 Chronic pain syndrome: Secondary | ICD-10-CM | POA: Diagnosis not present

## 2017-06-27 NOTE — Progress Notes (Signed)
Subjective:    Patient ID: Gloria Carter, female    DOB: May 23, 1966, 51 y.o.   MRN: [id number] Chief Complaint  Patient presents with  . Pancreatitis  . Follow-up    HPI   Is able to stay upright for a few hours throughout the day. Can get into her car now. Is able to get around the grocery store and some of the big box stores while walking.   Has gained 5.5 lbs in the past 12 d.  Had lasagna last night with only a zantac and no other increase in abd pain.   Feels like she is getting better, the abdominal pain is still there some but this past week has seen some dramatic improvement.  Normal GI/GU. No f/c.   She is taking the oxycodone as needed - makes her very sleepy. Once every 3 days for fibromyalgia pain.  Doesn't have an appointment with Dr. Christella Hartigan.  Past Medical History:  Diagnosis Date  . ADD (attention deficit disorder)   . Allergy    ENVIRONMENTAL  . Arthritis    knees  . Asthma   . Depression   . Difficulty sleeping   . Dysphagia   . Fibromyalgia   . GERD (gastroesophageal reflux disease)   . Hernia of abdominal wall   . Hyperlipidemia   . Hypertension   . IBS (irritable bowel syndrome)   . Migraine   . Numbness and tingling in hands   . Obesity   . Swelling of both ankles   . Thyroid disease    Past Surgical History:  Procedure Laterality Date  . INSERTION OF MESH N/A 03/28/2014   Procedure: INSERTION OF MESH;  Surgeon: Velora Heckler, MD;  Location: [facility];  Service: General;  Laterality: N/A;  . TONSILECTOMY/ADENOIDECTOMY WITH MYRINGOTOMY    . VENTRAL HERNIA REPAIR N/A 03/28/2014   Procedure: LAPAROSCOPIC VENTRAL HERNIA REPAIR WITH MESH;  Surgeon: Velora Heckler, MD;  Location: [facility];  Service: General;  Laterality: N/A;  . WISDOM TOOTH EXTRACTION     Current Outpatient Prescriptions on File Prior to Visit  Medication Sig Dispense Refill  . albuterol (PROVENTIL HFA) 108 (90 BASE) MCG/ACT inhaler INHALE TWO PUFFS BY MOUTH EVERY SIX HOURS AS  NEEDED (Patient taking differently: Inhale 2 puffs into the lungs every 6 (six) hours as needed for wheezing or shortness of breath. INHALE TWO PUFFS BY MOUTH EVERY SIX HOURS AS NEEDED) 18 Inhaler 11  . amphetamine-dextroamphetamine (ADDERALL) 10 MG tablet Take 1 tablet (10 mg total) by mouth 2 (two) times daily. 60 tablet 0  . azelastine (OPTIVAR) 0.05 % ophthalmic solution PLACE TWO DRIPS INTO BOTH EYES TWICE DAILY (Patient taking differently: PLACE TWO DRIPS INTO BOTH EYES TWICE DAILY FOR DRY EYES) 6 mL 5  . B Complex-C-Folic Acid (B-COMPLEX BALANCED PO) Take 1 tablet by mouth daily.     . calcium carbonate (OS-CAL) 600 MG TABS Take 600 mg by mouth daily.    . carisoprodol (SOMA) 350 MG tablet Take 1 tablet (350 mg total) by mouth 4 (four) times daily as needed for muscle spasms. 120 tablet 2  . cetirizine (ZYRTEC) 10 MG tablet Take 10 mg by mouth daily as needed for allergies.     . cholecalciferol (VITAMIN D) 400 UNITS TABS Take 400 Units by mouth daily.    . citalopram (CELEXA) 40 MG tablet Take 1 tablet (40 mg total) by mouth daily. 90 tablet 0  . dicyclomine (BENTYL) 20 MG tablet Take 1 tablet (20  mg total) by mouth 3 (three) times daily before meals. As needed for bowel cramping 90 tablet 0  . esomeprazole (NEXIUM) 40 MG capsule Take 1 capsule (40 mg total) by mouth daily. 90 capsule 0  . Fluticasone-Salmeterol (ADVAIR DISKUS) 500-50 MCG/DOSE AEPB Inhale 1 puff into the lungs 2 (two) times daily. 60 each 1  . levothyroxine (SYNTHROID) 75 MCG tablet Take 1 tablet (75 mcg total) by mouth daily before breakfast. 90 tablet 0  . lipase/protease/amylase (CREON) 36000 UNITS CPEP capsule Take 1 capsule (36,000 Units total) by mouth as directed. 4 capsules with a meal And 1 capsule with snacks 390 capsule 11  . montelukast (SINGULAIR) 10 MG tablet Take 1 tablet (10 mg total) by mouth every morning. 90 tablet 3  . Multiple Vitamin (MULTIVITAMIN) tablet Take 1 tablet by mouth daily.    . ondansetron  (ZOFRAN) 4 MG tablet Take 1 tablet (4 mg total) by mouth every 6 (six) hours. (Patient taking differently: Take 4 mg by mouth every 8 (eight) hours as needed for nausea or vomiting. ) 60 tablet 1  . oxyCODONE (OXY IR/ROXICODONE) 5 MG immediate release tablet Take 1-2 tablets (5-10 mg total) by mouth every 4 (four) hours as needed for severe pain. 60 tablet 0  . Polyethyl Glycol-Propyl Glycol (SYSTANE) 0.4-0.3 % SOLN Apply 1 drop to eye 3 (three) times daily as needed (dry eyes).    . pregabalin (LYRICA) 150 MG capsule Take 1 capsule (150 mg total) by mouth 3 (three) times daily. 270 capsule 0  . ranitidine (ZANTAC) 150 MG tablet Take 150 mg by mouth 2 (two) times daily as needed for heartburn.    . SUMAtriptan (IMITREX) 100 MG tablet Take 1 tablet (100 mg total) by mouth every 2 (two) hours as needed for migraine. No more than 2 doses in 24 hours. 8 tablet 3  . telmisartan (MICARDIS) 80 MG tablet Take 1 tablet (80 mg total) by mouth daily. 90 tablet 0  . vitamin C (ASCORBIC ACID) 500 MG tablet Take 1,000 mg by mouth daily.    . [DISCONTINUED] hyoscyamine (CYSTOSPAZ) 0.15 MG tablet Take 0.15 mg by mouth every 4 (four) hours as needed.     No current facility-administered medications on file prior to visit.    Allergies  Allergen Reactions  . Soap     Any soaps with fragrance cause migraines  . Sulfa Antibiotics Nausea And Vomiting   Family History  Problem Relation Age of Onset  . COPD Mother   . Fibromyalgia Mother   . Heart disease Father   . Colon polyps Father   . Stroke Sister   . Fibromyalgia Sister   . Colon polyps Maternal Grandmother   . Lung disease Paternal Grandmother   . Stroke Paternal Grandmother   . Diabetes Paternal Grandfather   . Heart disease Paternal Grandfather   . Diabetes Maternal Uncle   . Colon polyps Maternal Aunt   . Aortic aneurysm Maternal Aunt   . Cystic kidney disease Maternal Aunt        kidney cyst  . Kidney disease Maternal Aunt   . Fibromyalgia  Unknown        cousin  . Colon cancer Neg Hx    Social History   Social History  . Marital status: Legally Separated    Spouse name: N/A  . Number of children: 0  . Years of education: N/A   Occupational History  . 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  Smoking status: Never Smoker  . Smokeless tobacco: Never Used  . Alcohol use No  . Drug use: No  . Sexual activity: Yes   Other Topics Concern  . None   Social History Narrative   Married. Education: [employer].    Depression screen [facility] 2/9 06/27/2017 06/15/2017 06/01/2017 05/18/2017 05/16/2017  Decreased Interest 0 0 0 0 0  Down, Depressed, Hopeless 0 0 0 0 0  PHQ - 2 Score 0 0 0 0 0     Review of Systems See hpi    Objective:   Physical Exam  Constitutional: She is oriented to person, place, and time. She appears well-developed and well-nourished. No distress.  HENT:  Head: Normocephalic and atraumatic.  Right Ear: External ear normal.  Left Ear: External ear normal.  Eyes: Conjunctivae are normal. No scleral icterus.  Neck: Normal range of motion. Neck supple. No thyromegaly present.  Cardiovascular: Normal rate, regular rhythm, normal heart sounds and intact distal pulses.   Pulmonary/Chest: Effort normal and breath sounds normal. No respiratory distress.  Abdominal: Soft. Bowel sounds are normal. She exhibits no distension and no mass. There is tenderness. There is no rebound and no guarding.  Musculoskeletal: She exhibits no edema.  Lymphadenopathy:    She has no cervical adenopathy.  Neurological: She is alert and oriented to person, place, and time.  Skin: Skin is warm and dry. She is not diaphoretic. No erythema.  Psychiatric: She has a normal mood and affect. Her behavior is normal.      BP 119/78   Pulse 92   Temp 98 F (36.7 C) (Oral)   Resp 18   Ht  (1.575 m)   Wt 267 lb 12.8 oz (121.5 kg)   SpO2 94%   BMI 48.98 kg/m   Assessment & Plan:  Christella Hartigan email about safety  of tramadol. Was also on hctz.  1. Idiopathic acute pancreatitis without infection or necrosis   2. Polypharmacy   3. Pedal edema   4. Chronic pain syndrome   5. Fibromyalgia   6. Physical deconditioning   7. Abdominal pain, epigastric     Greater than 50% of the 25 minute visit was spent in counseling/coordination of care regarding interventions for edema, methods to regain physical activity and ready self physically for return towork.  Norberto Sorenson, M.D.  Primary Care at [facility] [address] [place], [place] [address] [phone] phone [phone] fax  06/30/17 5:49 AM

## 2017-06-27 NOTE — Patient Instructions (Addendum)
     IF you received an x-ray today, you will receive an invoice from [facility]. Please contact [facility] at [phone] with questions or concerns regarding your invoice.   IF you received labwork today, you will receive an invoice from [facility]. Please contact [facility] at [phone] with questions or concerns regarding your invoice.   Our billing staff will not be able to assist you with questions regarding bills from these companies.  You will be contacted with the lab results as soon as they are available. The fastest way to get your results is to activate your My Chart account. Instructions are located on the last page of this paperwork. If you have not heard from [place] regarding the results in 2 weeks, please contact this office.     Use the tramadol sparingly and try to pay attention to your abdominal pain the next day or two.    Look for extra-large compression stockings on [employer].

## 2017-06-27 NOTE — Telephone Encounter (Signed)
Patient has appointment today at 2:40pm--can we complete these forms during the visit and make sure the patient and FML[employer] get a copy of the forms.  Thank you!

## 2017-06-27 NOTE — Telephone Encounter (Signed)
Completed and returned

## 2017-06-29 NOTE — Telephone Encounter (Signed)
Paperwork scanned and faxed to [facility] as well as sent to patient through [employer]

## 2017-07-20 ENCOUNTER — Other Ambulatory Visit: Payer: Self-pay | Admitting: Physician Assistant

## 2017-07-20 NOTE — Telephone Encounter (Signed)
Please advice. Thanks

## 2017-07-21 ENCOUNTER — Other Ambulatory Visit: Payer: Self-pay | Admitting: Family Medicine

## 2017-07-22 NOTE — Telephone Encounter (Signed)
was changed to  citalopram on 06/01/17 -  dose d/c'd.

## 2017-07-22 NOTE — Telephone Encounter (Signed)
Please advise 

## 2017-07-24 ENCOUNTER — Other Ambulatory Visit: Payer: Self-pay | Admitting: Physician Assistant

## 2017-07-24 ENCOUNTER — Other Ambulatory Visit: Payer: Self-pay | Admitting: Family Medicine

## 2017-07-24 DIAGNOSIS — M797 Fibromyalgia: Secondary | ICD-10-CM

## 2017-07-24 DIAGNOSIS — G894 Chronic pain syndrome: Secondary | ICD-10-CM

## 2017-07-25 NOTE — Telephone Encounter (Signed)
Please advise 

## 2017-07-26 ENCOUNTER — Other Ambulatory Visit: Payer: Self-pay | Admitting: Physician Assistant

## 2017-07-26 NOTE — Telephone Encounter (Signed)
Pt was given a paper rx for this for a 3 month supply at her visit on 8/22 (#120 with 2 refills) so should not need refill until the end of November.  Please check w/ pt and see why the early refill request. Did she check to see if her pharmacy had the 8/22 rx waiting on file for her before she put in a refill request??

## 2017-07-27 ENCOUNTER — Other Ambulatory Visit: Payer: Self-pay | Admitting: Family Medicine

## 2017-07-27 NOTE — Telephone Encounter (Signed)
IC patient LMOVM cell Advised Dr. Alver FisherShaw's message. Asked pt to have pharmacy send to us if there was a question on the refill.

## 2017-07-27 NOTE — Telephone Encounter (Signed)
Please advise 

## 2017-07-29 NOTE — Telephone Encounter (Signed)
Please advise 

## 2017-08-01 NOTE — Telephone Encounter (Signed)
Pt was change to 40mg  on 8/22 and a 3 mo supply was given at that time.

## 2017-08-02 ENCOUNTER — Ambulatory Visit (INDEPENDENT_AMBULATORY_CARE_PROVIDER_SITE_OTHER): Payer: 59 | Admitting: Gastroenterology

## 2017-08-02 ENCOUNTER — Encounter: Payer: Self-pay | Admitting: Gastroenterology

## 2017-08-02 ENCOUNTER — Other Ambulatory Visit: Payer: 59

## 2017-08-02 VITALS — BP 118/86 | HR 100 | Ht 62.6 in | Wt 263.2 lb

## 2017-08-02 DIAGNOSIS — K859 Acute pancreatitis without necrosis or infection, unspecified: Secondary | ICD-10-CM | POA: Diagnosis not present

## 2017-08-02 NOTE — Progress Notes (Signed)
HPI: This is a very pleasant 51 year old woman who is here with her mother today.  I last saw her about a year ago however she was here in the office about 2 months ago and saw Victorino Dike  Chief complaint is recurrent acute pancreatitis  ROS: complete GI ROS as described in HPI, all other review negative.  CT scan April 2017 showed "changes consistent with acute pancreatitis involving the head and uncinate process" MRI May 2017 showed normal pancreas.  Ultrasound may 2017 showed normal gallbladder without stones.  There were also no stones on her MRI.  HIDA scan May 2017 was normal.  Normal gallbladder ejection fraction.  July 2018 CT scan abdomen and pelvis showed "prominent acute pancreatitis".  Follow-up CT scan August 2018 showed "interval improvement in previously noted pancreatitis".  Her LFTs do not elevate during the episodes and lipase barely elevates.  Eating chocolate aggrivates her abd:  Overall weight in past 6 months, lost 8-10 pounds.  Constitutional:  No unintentional weight loss   Past Medical History:  Diagnosis Date  . ADD (attention deficit disorder)   . Allergy    ENVIRONMENTAL  . Arthritis    knees  . Asthma   . Depression   . Difficulty sleeping   . Dysphagia   . Fibromyalgia   . GERD (gastroesophageal reflux disease)   . Hernia of abdominal wall   . Hyperlipidemia   . Hypertension   . IBS (irritable bowel syndrome)   . Migraine   . Numbness and tingling in hands   . Obesity   . Swelling of both ankles   . Thyroid disease     Past Surgical History:  Procedure Laterality Date  . INSERTION OF MESH N/A 03/28/2014   Procedure: INSERTION OF MESH;  Surgeon: Velora Heckler, MD;  Location: [facility];  Service: General;  Laterality: N/A;  . TONSILECTOMY/ADENOIDECTOMY WITH MYRINGOTOMY    . VENTRAL HERNIA REPAIR N/A 03/28/2014   Procedure: LAPAROSCOPIC VENTRAL HERNIA REPAIR WITH MESH;  Surgeon: Velora Heckler, MD;  Location: [facility];  Service: General;  Laterality:  N/A;  . WISDOM TOOTH EXTRACTION      Current Outpatient Prescriptions  Medication Sig Dispense Refill  . albuterol (PROVENTIL HFA;VENTOLIN HFA) 108 (90 Base) MCG/ACT inhaler INHALE TWO PUFFS BY MOUTH EVERY SIX HOURS AS NEEDED**30 DAYS SUPPLY** 18 Inhaler 0  . amoxicillin (AMOXIL) 875 MG tablet Take 1 tablet by mouth every 12 (twelve) hours.  0  . amphetamine-dextroamphetamine (ADDERALL) 10 MG tablet Take 1 tablet (10 mg total) by mouth 2 (two) times daily. 60 tablet 0  . azelastine (OPTIVAR) 0.05 % ophthalmic solution PLACE TWO DRIPS INTO BOTH EYES TWICE DAILY (Patient taking differently: PLACE TWO DRIPS INTO BOTH EYES TWICE DAILY FOR DRY EYES) 6 mL 5  . B Complex-C-Folic Acid (B-COMPLEX BALANCED PO) Take 1 tablet by mouth daily.     . calcium carbonate (OS-CAL) 600 MG TABS Take 600 mg by mouth daily.    . carisoprodol (SOMA) 350 MG tablet Take 1 tablet (350 mg total) by mouth 4 (four) times daily as needed for muscle spasms. 120 tablet 2  . cetirizine (ZYRTEC) 10 MG tablet Take 10 mg by mouth daily as needed for allergies.     . cholecalciferol (VITAMIN D) 400 UNITS TABS Take 400 Units by mouth daily.    . citalopram (CELEXA) 40 MG tablet Take 1 tablet (40 mg total) by mouth daily. 90 tablet 0  . dicyclomine (BENTYL) 20 MG tablet Take 1 tablet (  20 mg total) by mouth 3 (three) times daily before meals. As needed for bowel cramping 90 tablet 0  . esomeprazole (NEXIUM) 40 MG capsule Take 1 capsule (40 mg total) by mouth daily. 90 capsule 0  . Fluticasone-Salmeterol (ADVAIR DISKUS) 500-50 MCG/DOSE AEPB Inhale 1 puff into the lungs 2 (two) times daily. 60 each 1  . levothyroxine (SYNTHROID) 75 MCG tablet Take 1 tablet (75 mcg total) by mouth daily before breakfast. 90 tablet 0  . lipase/protease/amylase (CREON) 36000 UNITS CPEP capsule Take 1 capsule (36,000 Units total) by mouth as directed. 4 capsules with a meal And 1 capsule with snacks 390 capsule 11  . montelukast (SINGULAIR) 10 MG tablet TAKE  1 TABLET BY MOUTH EVERY MORNING 90 tablet 0  . Multiple Vitamin (MULTIVITAMIN) tablet Take 1 tablet by mouth daily.    . ondansetron (ZOFRAN) 4 MG tablet Take 1 tablet (4 mg total) by mouth every 6 (six) hours. (Patient taking differently: Take 4 mg by mouth every 8 (eight) hours as needed for nausea or vomiting. ) 60 tablet 1  . oxyCODONE (OXY IR/ROXICODONE) 5 MG immediate release tablet Take 1-2 tablets (5-10 mg total) by mouth every 4 (four) hours as needed for severe pain. 60 tablet 0  . Polyethyl Glycol-Propyl Glycol (SYSTANE) 0.4-0.3 % SOLN Apply 1 drop to eye 3 (three) times daily as needed (dry eyes).    . pregabalin (LYRICA) 150 MG capsule Take 1 capsule (150 mg total) by mouth 3 (three) times daily. 270 capsule 0  . ranitidine (ZANTAC) 150 MG tablet Take 150 mg by mouth 2 (two) times daily as needed for heartburn.    . SUMAtriptan (IMITREX) 100 MG tablet Take 1 tablet (100 mg total) by mouth every 2 (two) hours as needed for migraine. No more than 2 doses in 24 hours. 8 tablet 3  . telmisartan (MICARDIS) 80 MG tablet Take 1 tablet (80 mg total) by mouth daily. 90 tablet 0  . vitamin C (ASCORBIC ACID) 500 MG tablet Take 1,000 mg by mouth daily.     No current facility-administered medications for this visit.     Allergies as of 08/02/2017 - Review Complete 08/02/2017  Allergen Reaction Noted  . Soap  03/28/2014  . Sulfa antibiotics Nausea And Vomiting 12/18/2011    Family History  Problem Relation Age of Onset  . COPD Mother   . Fibromyalgia Mother   . Heart disease Father   . Colon polyps Father   . Stroke Sister   . Fibromyalgia Sister   . Colon polyps Maternal Grandmother   . Lung disease Paternal Grandmother   . Stroke Paternal Grandmother   . Diabetes Paternal Grandfather   . Heart disease Paternal Grandfather   . Diabetes Maternal Uncle   . Colon polyps Maternal Aunt   . Aortic aneurysm Maternal Aunt   . Cystic kidney disease Maternal Aunt        kidney cyst  .  Kidney disease Maternal Aunt   . Fibromyalgia Unknown        cousin  . Colon cancer Neg Hx     Social History   Social History  . Marital status: Legally Separated    Spouse name: N/A  . Number of children: 0  . Years of education: N/A   Occupational History  . 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 Smoking status: Never Smoker  . Smokeless tobacco: Never Used  . Alcohol use No  . Drug use: No  .  Sexual activity: Yes   Other Topics Concern  . Not on file   Social History Narrative   Married. Education: [employer].      Physical Exam: BP 118/86 (BP Location: Right Wrist, Patient Position: Sitting, Cuff Size: Normal)   Pulse 100   Ht 5' 2.6" (1.59 m) Comment: height measured without shoes  Wt 263 lb 4 oz (119.4 kg)   BMI 47.23 kg/m  Constitutional: generally well-appearing Psychiatric: alert and oriented x3 Abdomen: soft, nontender, nondistended, no obvious ascites, no peritoneal signs, normal bowel sounds No peripheral edema noted in lower extremities  Assessment and plan: 51 y.o. female with recurrent acute pancreatitis  Unclear etiology.  She is not an alcohol drinker.  She does not have gallstones in her gallbladder by ultrasound or by MRI.  I did explain to her that sometimes gallstones can be so small, really more like granules of sand, that they are very difficult to pick up on imaging.   if we knew that were the case here, she would be recommended to have her gallbladder removed.  I discussed this with her a bit but she is not at all interested in a gallbladder surgery right now.  Her primary care physician wondered if tramadol may be playing a role with pancreatitis.  I have never personally seen that and on my review of drug index I do not see pancreas problems listed as a potential adverse drug effect.  I think it is fine that she restart her tramadol and I will let her primary care physician know that.  We have not checked for autoimmune  pancreatitis and I will send labs for IgG4 level today  She also knows to call here if she has any concern that she might have pancreatitis again.  Please see the "Patient Instructions" section for addition details about the plan.  Rob Buntinganiel Zuriah Bordas, MD [facility] 08/02/2017, 10:18 AM

## 2017-08-02 NOTE — Patient Instructions (Addendum)
You will have labs checked today in the basement lab.  (IgG 4).  Call here if you think you have acute pancreatitis.  OK to restart your tramadol, I will let Dr. Clelia CroftShaw know.  Normal BMI (Body Mass Index- based on height and weight) is between 19 and 25. Your BMI today is Body mass index is 47.23 kg/m. Marland Kitchen. Please consider follow up  regarding your BMI with your Primary Care Provider.

## 2017-08-04 LAB — IGG 4: IgG, Subclass 4: 26 mg/dL (ref 2–96)

## 2017-08-08 ENCOUNTER — Encounter: Payer: 59 | Attending: Family Medicine | Admitting: Dietician

## 2017-08-08 ENCOUNTER — Encounter: Payer: Self-pay | Admitting: Dietician

## 2017-08-08 DIAGNOSIS — Z6841 Body Mass Index (BMI) 40.0 and over, adult: Secondary | ICD-10-CM | POA: Diagnosis not present

## 2017-08-08 DIAGNOSIS — K8500 Idiopathic acute pancreatitis without necrosis or infection: Secondary | ICD-10-CM

## 2017-08-08 DIAGNOSIS — Z713 Dietary counseling and surveillance: Secondary | ICD-10-CM | POA: Diagnosis not present

## 2017-08-08 NOTE — Patient Instructions (Signed)
Limit fat to 40 grams per day. Be a label reading. (portion size and fat grams) Before eating out, read labels of menu items to make better choices. Consider more variety. Consider ways to increase your vegetable and fruit intake.  Great job on the changes that you have made!

## 2017-08-08 NOTE — Progress Notes (Signed)
  Medical Nutrition Therapy:  Appt start time: 1700 end time:  1745.   Assessment:  Primary concerns today: Patient is here today with her mother.  She was referred for idiopathic acute pancreatitis and would like to learn more about eating healthy.  She states that she is a picky eater.  Hx includes ADD, depression, fibromyalgia, dysphagia (difficulty swallowing large pills and occasional aspiration of liquids), HTN, hyperlipidemia, IBS, hypothyroidism, GERD. A1C 5.6% 06/15/17 and was in the prediabetes range a couple of years ago.  Medications include Creon (4 with meals and 1 with a snack).  Weight hx:   Weight today 263 lbs. She has lost 8-10 lbs in the past 6 months and has lost 60 lbs since April 2017.  Patient lives alone.  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 She no longer drinks soda or eats fast foods.  Preferred Learning Style:   No preference indicated   Learning Readiness:   Ready  Change in progress   DIETARY INTAKE:  Usual eating pattern includes 3 meals and 3 snacks per day.  Avoided foods include onions, tomatoes (allergy-rash), bananas (allergic-rash but OK when spread out), chocolate (allergy- rash, eats occasionally).  Wheat bread (agrivates IBS), butter, mayo  24-hr recall:   B ( AM): egg beaters without fat on 2 slices dry white toast OR occasional cereal and fat free milk   Snk ( AM): granola bar  L ( PM): [place]aw[place]key lunch meat on bun, cheez-its, occasional fruit (canned peaches or mandarin oranges Snk ( PM): smart food popcorn OR [place]key sticks D ( PM): chicken, mashed potatoes or rice, vegetables OR lean cuisine frozen meal OR baked potato with cheese, side salad with Lite dressing Snk ( PM): [place]key stick and reduced fat cheese Beverages: water, hot tea unsweetened, fairlife milk (fat free)  Usual physical activity: Limited with walker.  Goal is 2500 steps and she gets a little more than this daily.   Progress Towards  Goal(s):  In progress.   Nutritional Diagnosis:  NB-1.1 Food and nutrition-related knowledge deficit As related to low fat diet guidelines for pancreatitis.  As evidenced by diet hx and patient report.    Intervention:  Nutrition education regarding low fat guidelines for pancreatitis.  Label reading discussed.  Limit fat to 40 grams per day. Be a label reading. (portion size and fat grams) Before eating out, read labels of menu items to make better choices. Consider more variety. Consider ways to increase your vegetable and fruit intake.  Great job on the changes that you have made!  Teaching Method Utilized:  Visual Auditory Hands on  Handouts given during visit include:  Pancreatitis nutrition therapy from [facility]  Label reading  Barriers to learning/adherence to lifestyle change: none  Demonstrated degree of understanding via:  Teach Back   Monitoring/Evaluation:  Dietary intake, exercise, label reading, and body weight prn.

## 2017-08-21 ENCOUNTER — Other Ambulatory Visit: Payer: Self-pay | Admitting: Family Medicine

## 2017-09-17 ENCOUNTER — Other Ambulatory Visit: Payer: Self-pay | Admitting: Family Medicine

## 2017-09-19 ENCOUNTER — Other Ambulatory Visit: Payer: Self-pay | Admitting: Family Medicine

## 2017-09-20 NOTE — Telephone Encounter (Signed)
Sent in 1 mo supply - rec OV for any additional refills.

## 2017-09-20 NOTE — Telephone Encounter (Signed)
Tramadol was d/c'd as pancreatitis listed as a side effect but GI doctor though this was unlikely to be cause. Dr. Rob Buntinganiel Jacobs stated: "I actually could not find any mention of pancreatitis on my drug ADR index. I suspect if it is potential reaction it is pretty uncommon. I think it is okay to try her back on tramadol at whatever dose is she normally needs "  He also noted that if pt has another episode of pancreatitis - she should be considered for a cholecystectomy as could be due to "microlithiasis" - stones to small to be seen on any imaging.  Today I have utilized the  Controlled Substance Registry's online query to confirm compliance regarding the patient's controlled medications. My review reveals appropriate prescription fills and that I am the sole provider of these medications. Rechecks will occur regularly and the patient is aware of our use of the system.  Pt has filled lyrica 150 tid #90 written 8/22 on 8/27, 10/14, 11/19 Filled soma 350 qid #120 on 8/25 (from a 4/9 rx) and 10/29 from a 8/22 rx Filled tramadol 50 qid #120 from 4/9 rx on 9/15 Filled oxycodone 5mg  #60 written 8/22 on 8/25 Filled adderall 20 bid #60 written 8/25 on 8/25.

## 2017-09-26 ENCOUNTER — Other Ambulatory Visit: Payer: Self-pay | Admitting: Family Medicine

## 2017-09-26 NOTE — Telephone Encounter (Signed)
How many refills can give?

## 2017-10-01 ENCOUNTER — Other Ambulatory Visit: Payer: Self-pay | Admitting: Family Medicine

## 2017-10-01 DIAGNOSIS — M797 Fibromyalgia: Secondary | ICD-10-CM

## 2017-10-04 NOTE — Telephone Encounter (Signed)
Needs OV for any additional refills.  [facility] drug database reviewed -  Lyrica written 06/01/17 for #90/mo filled 8/27, 10/14, 11/19 Has also had soma #120/mo filled 8/25 (written 4/9) and 10/29, 12/8 (written 8/22) Filled tramadol 50mg  #120 written 4/9 filled 9/15 and 12/11 filled same day.

## 2017-10-23 ENCOUNTER — Other Ambulatory Visit: Payer: Self-pay | Admitting: Family Medicine

## 2017-10-24 ENCOUNTER — Other Ambulatory Visit: Payer: Self-pay | Admitting: Family Medicine

## 2017-11-19 ENCOUNTER — Other Ambulatory Visit: Payer: Self-pay

## 2017-11-19 ENCOUNTER — Ambulatory Visit: Payer: 59 | Admitting: Family Medicine

## 2017-11-19 ENCOUNTER — Encounter: Payer: Self-pay | Admitting: Family Medicine

## 2017-11-19 VITALS — BP 135/83 | HR 90 | Temp 98.6°F | Resp 18 | Ht 62.0 in | Wt 267.0 lb

## 2017-11-19 DIAGNOSIS — G43809 Other migraine, not intractable, without status migrainosus: Secondary | ICD-10-CM | POA: Diagnosis not present

## 2017-11-19 DIAGNOSIS — E039 Hypothyroidism, unspecified: Secondary | ICD-10-CM | POA: Diagnosis not present

## 2017-11-19 DIAGNOSIS — Z5181 Encounter for therapeutic drug level monitoring: Secondary | ICD-10-CM | POA: Diagnosis not present

## 2017-11-19 DIAGNOSIS — G894 Chronic pain syndrome: Secondary | ICD-10-CM

## 2017-11-19 DIAGNOSIS — Z113 Encounter for screening for infections with a predominantly sexual mode of transmission: Secondary | ICD-10-CM

## 2017-11-19 DIAGNOSIS — Z1231 Encounter for screening mammogram for malignant neoplasm of breast: Secondary | ICD-10-CM

## 2017-11-19 DIAGNOSIS — K861 Other chronic pancreatitis: Secondary | ICD-10-CM | POA: Diagnosis not present

## 2017-11-19 DIAGNOSIS — Z1211 Encounter for screening for malignant neoplasm of colon: Secondary | ICD-10-CM | POA: Diagnosis not present

## 2017-11-19 DIAGNOSIS — R7303 Prediabetes: Secondary | ICD-10-CM

## 2017-11-19 DIAGNOSIS — M797 Fibromyalgia: Secondary | ICD-10-CM

## 2017-11-19 DIAGNOSIS — Z1239 Encounter for other screening for malignant neoplasm of breast: Secondary | ICD-10-CM

## 2017-11-19 DIAGNOSIS — IMO0002 Reserved for concepts with insufficient information to code with codable children: Secondary | ICD-10-CM

## 2017-11-19 DIAGNOSIS — F988 Other specified behavioral and emotional disorders with onset usually occurring in childhood and adolescence: Secondary | ICD-10-CM | POA: Diagnosis not present

## 2017-11-19 DIAGNOSIS — K859 Acute pancreatitis without necrosis or infection, unspecified: Secondary | ICD-10-CM

## 2017-11-19 MED ORDER — AMPHETAMINE-DEXTROAMPHET ER 20 MG PO CP24: 20.0000 mg | ORAL_CAPSULE | ORAL | 0 refills | Status: DC

## 2017-11-19 MED ORDER — PREGABALIN 150 MG PO CAPS: 150.0000 mg | ORAL_CAPSULE | Freq: Three times a day (TID) | ORAL | 2 refills | Status: DC

## 2017-11-19 MED ORDER — CARISOPRODOL 350 MG PO TABS: 350.0000 mg | ORAL_TABLET | Freq: Four times a day (QID) | ORAL | 2 refills | Status: DC | PRN

## 2017-11-19 MED ORDER — AZELASTINE HCL 0.05 % OP SOLN: OPHTHALMIC | 5 refills | Status: DC

## 2017-11-19 MED ORDER — TELMISARTAN 80 MG PO TABS: 80.0000 mg | ORAL_TABLET | Freq: Every day | ORAL | 1 refills | Status: DC

## 2017-11-19 MED ORDER — ALBUTEROL SULFATE HFA 108 (90 BASE) MCG/ACT IN AERS: 2.0000 | INHALATION_SPRAY | RESPIRATORY_TRACT | 11 refills | Status: DC | PRN

## 2017-11-19 MED ORDER — ESOMEPRAZOLE MAGNESIUM 40 MG PO CPDR: DELAYED_RELEASE_CAPSULE | ORAL | 0 refills | Status: DC

## 2017-11-19 MED ORDER — MONTELUKAST SODIUM 10 MG PO TABS: 10.0000 mg | ORAL_TABLET | Freq: Every morning | ORAL | 3 refills | Status: DC

## 2017-11-19 MED ORDER — SUMATRIPTAN SUCCINATE 100 MG PO TABS: 100.0000 mg | ORAL_TABLET | ORAL | 3 refills | Status: DC | PRN

## 2017-11-19 MED ORDER — CITALOPRAM HYDROBROMIDE 40 MG PO TABS: 40.0000 mg | ORAL_TABLET | Freq: Every day | ORAL | 1 refills | Status: DC

## 2017-11-19 MED ORDER — FLUTICASONE-SALMETEROL 500-50 MCG/DOSE IN AEPB: INHALATION_SPRAY | RESPIRATORY_TRACT | 5 refills | Status: DC

## 2017-11-19 MED ORDER — TRAMADOL HCL 50 MG PO TABS: ORAL_TABLET | ORAL | 2 refills | Status: DC

## 2017-11-19 NOTE — Patient Instructions (Signed)
     IF you received an x-ray today, you will receive an invoice from [facility]. Please contact [facility] at [phone] with questions or concerns regarding your invoice.   IF you received labwork today, you will receive an invoice from [facility]. Please contact [facility] at [phone] with questions or concerns regarding your invoice.   Our billing staff will not be able to assist you with questions regarding bills from these companies.  You will be contacted with the lab results as soon as they are available. The fastest way to get your results is to activate your My Chart account. Instructions are located on the last page of this paperwork. If you have not heard from us regarding the results in 2 weeks, please contact this office.     
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
## 2017-11-20 LAB — HCV INTERPRETATION

## 2017-11-20 LAB — COMPREHENSIVE METABOLIC PANEL
A/G RATIO: 1.5 (ref 1.2–2.2)
ALBUMIN: 4.1 g/dL (ref 3.5–5.5)
ALK PHOS: 110 IU/L (ref 39–117)
ALT: 19 IU/L (ref 0–32)
AST: 16 IU/L (ref 0–40)
BILIRUBIN TOTAL: 0.4 mg/dL (ref 0.0–1.2)
BUN / CREAT RATIO: 24 — AB (ref 9–23)
BUN: 13 mg/dL (ref 6–24)
CHLORIDE: 104 mmol/L (ref 96–106)
CO2: 24 mmol/L (ref 20–29)
Calcium: 9.2 mg/dL (ref 8.7–10.2)
Creatinine, Ser: 0.55 mg/dL — ABNORMAL LOW (ref 0.57–1.00)
GFR calc Af Amer: 124 mL/min/{1.73_m2} (ref >59)
GFR calc non Af Amer: 107 mL/min/{1.73_m2} (ref >59)
Globulin, Total: 2.7 g/dL (ref 1.5–4.5)
Glucose: 85 mg/dL (ref 65–99)
POTASSIUM: 4.1 mmol/L (ref 3.5–5.2)
Sodium: 142 mmol/L (ref 134–144)
TOTAL PROTEIN: 6.8 g/dL (ref 6.0–8.5)

## 2017-11-20 LAB — HEMOGLOBIN A1C
Est. average glucose Bld gHb Est-mCnc: 114 mg/dL
Hgb A1c MFr Bld: 5.6 % (ref 4.8–5.6)

## 2017-11-20 LAB — HCV AB W/RFLX TO VERIFICATION: HCV Ab: <0.1 s/co ratio (ref 0.0–0.9)

## 2017-11-20 LAB — LIPASE: Lipase: 13 U/L — ABNORMAL LOW (ref 14–72)

## 2017-11-20 LAB — TSH: TSH: 0.853 u[IU]/mL (ref 0.450–4.500)

## 2017-11-20 IMAGING — CT CT ABDOMEN W/ CM
2 of 5 series · 15 of 46 positions shown, 17 images · IV contrast (iopamidol)
Comparison: 04/22/17

CLINICAL DATA: History of pancreatitis. Abdominal pain/epigastric
pain.

EXAM:
CT ABDOMEN WITH CONTRAST
TECHNIQUE: Multidetector CT imaging of the abdomen was performed using the
standard protocol following bolus administration of intravenous
contrast.
CONTRAST:  100mL 8LR1HL-AFF IOPAMIDOL (8LR1HL-AFF) INJECTION 61%

[Series 2: abdomen w 5.0 i40f 2 · axial · 0.81mm/px · z∈[-355,-135]mm · 12 of 53 slices shown, 14 images]
[im 5/53  soft-tissue]
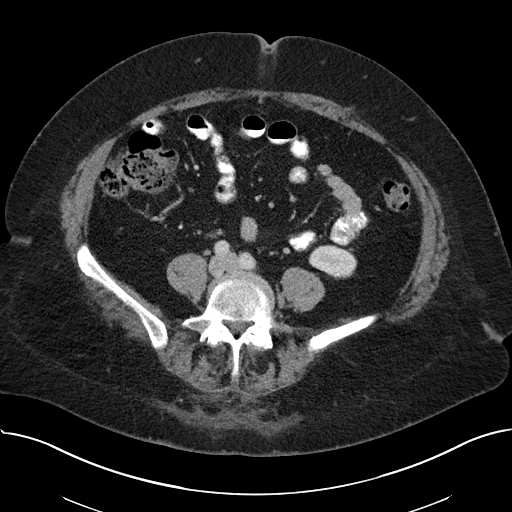
[im 5/53  bone]
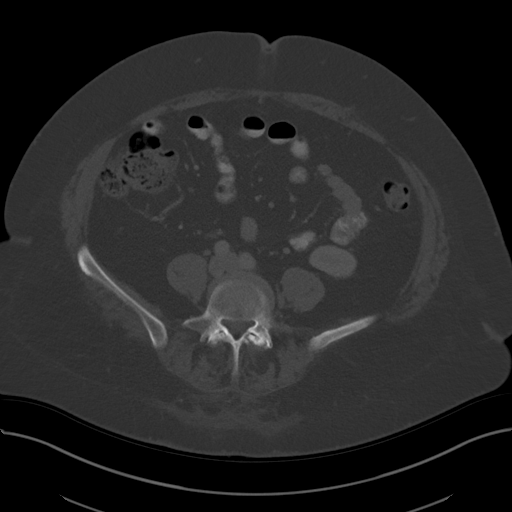
[im 9/53  soft-tissue]
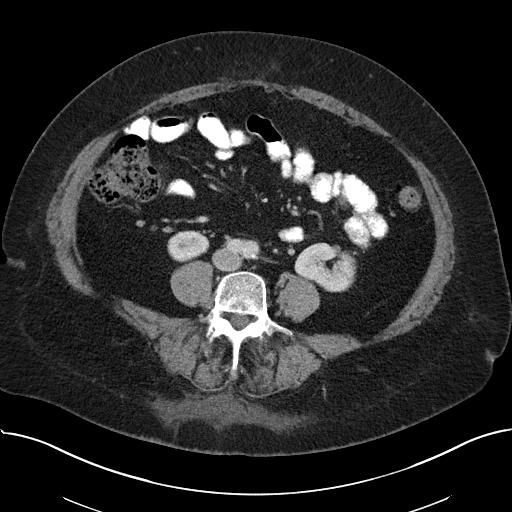
[im 13/53  soft-tissue]
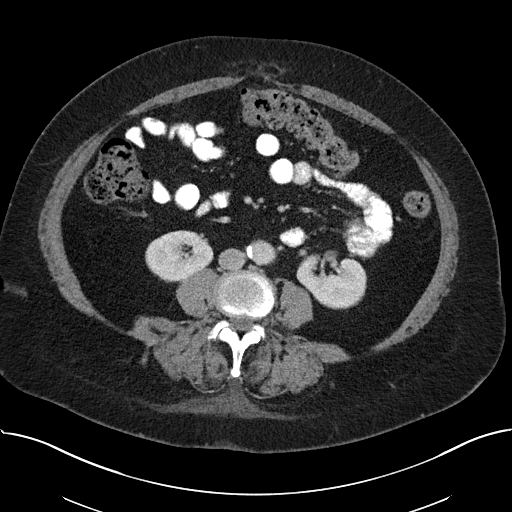
[im 17/53  soft-tissue]
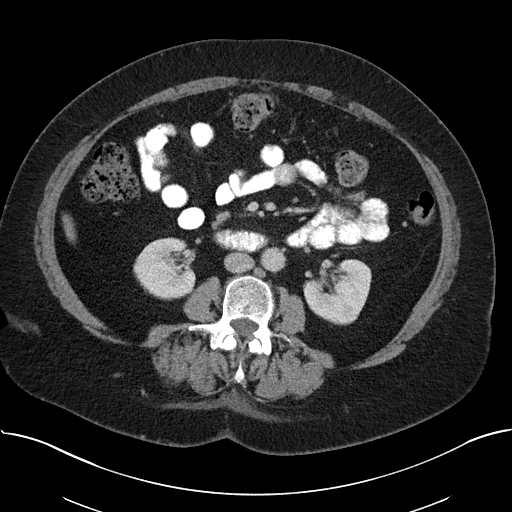
[im 21/53  soft-tissue]
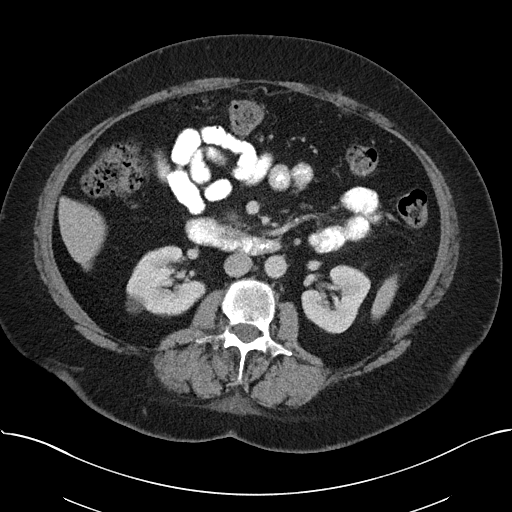
[im 25/53  soft-tissue]
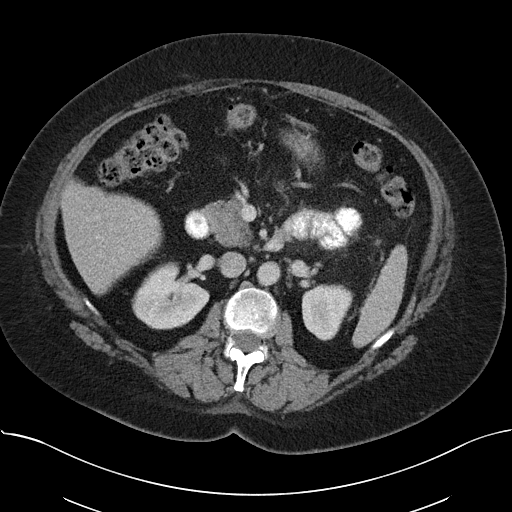
[im 29/53  soft-tissue]
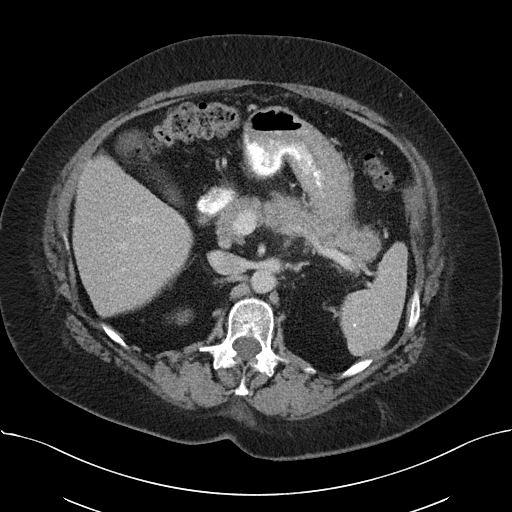
[im 33/53  soft-tissue]
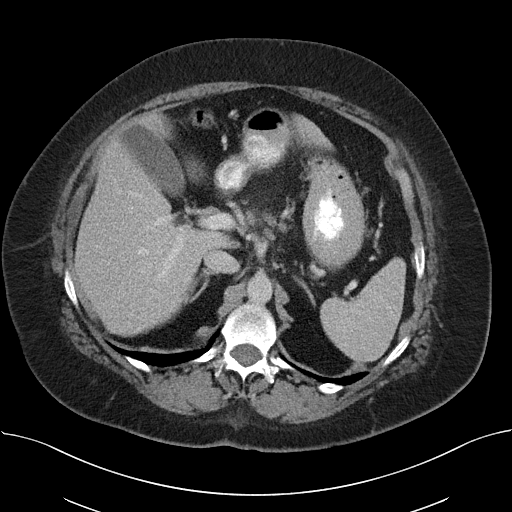
[im 37/53  soft-tissue]
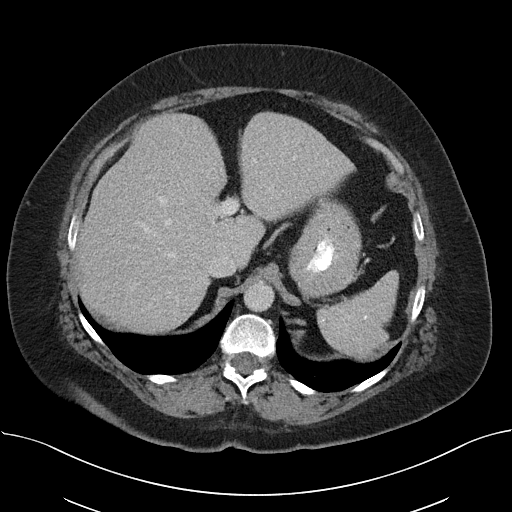
[im 37/53  bone]
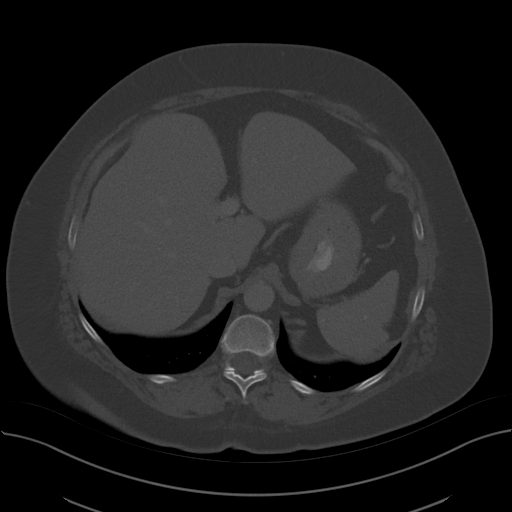
[im 41/53  soft-tissue]
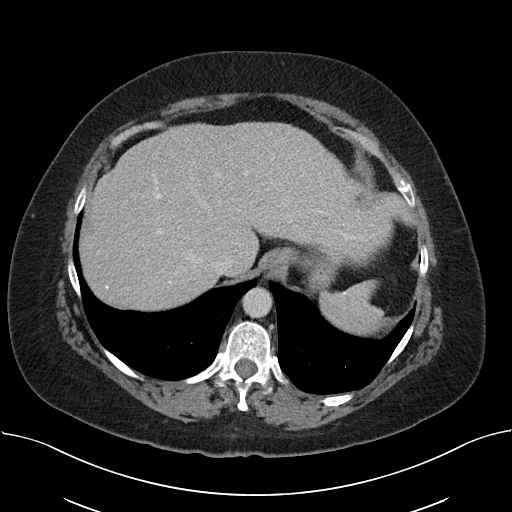
[im 45/53  soft-tissue]
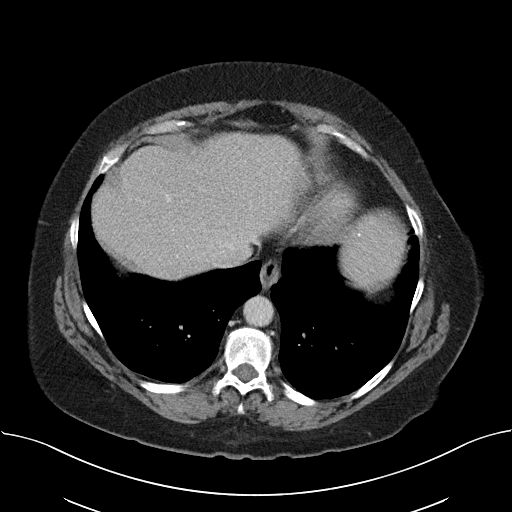
[im 49/53  soft-tissue]
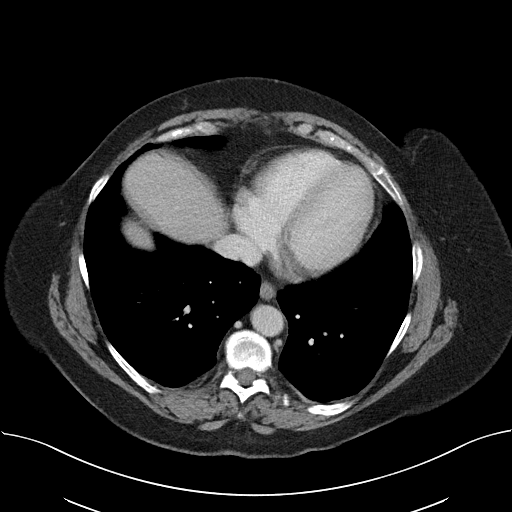

[Series 5: coronal · coronal · 0.53mm/px · 3 of 101 slices shown]
[im 34/101  soft-tissue]
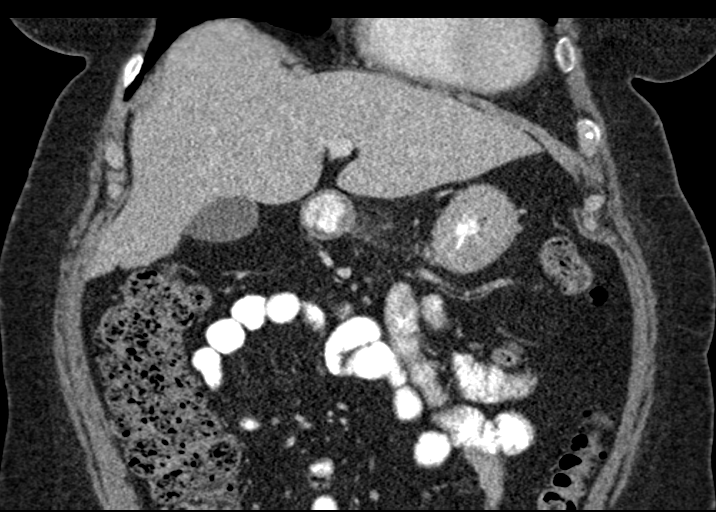
[im 45/101  soft-tissue]
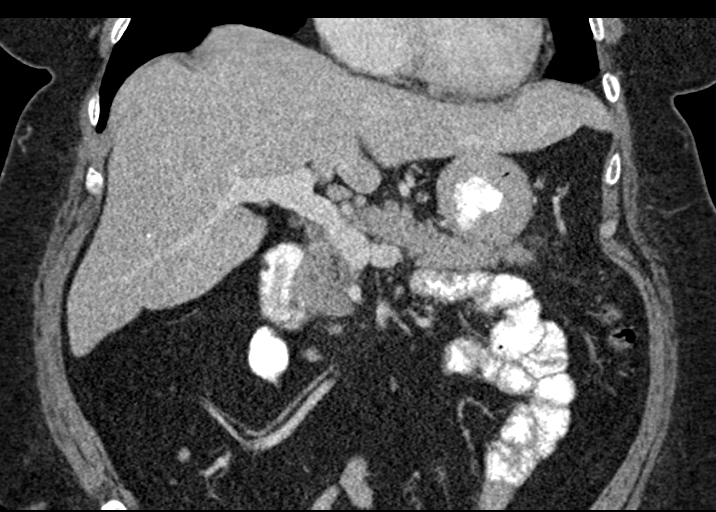
[im 56/101  soft-tissue]
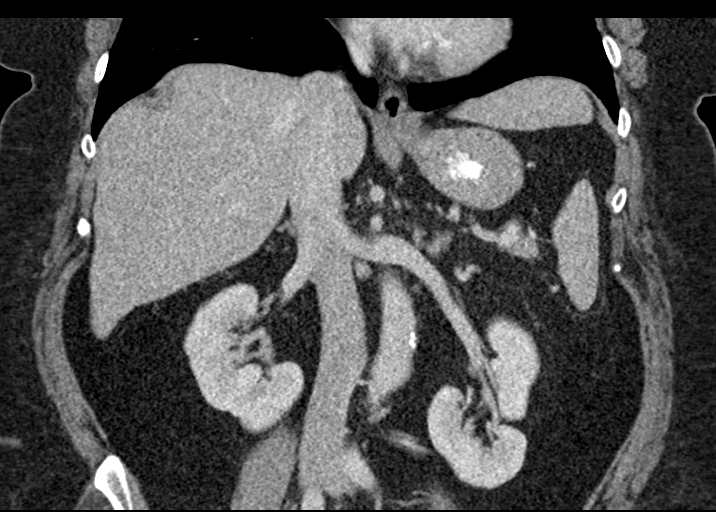

[15 of 46 positions shown; findings below may reference images not displayed]

FINDINGS: Lower chest: The lung bases appear clear.

Hepatobiliary: No focal liver abnormality identified. The
gallbladder appears normal. No biliary dilatation.

Pancreas: Interval decrease in pancreatic edema and inflammation. No
fluid collections associated with the pancreas identified. No mass
or pancreatic ductal dilatation.

Spleen: Calcified granulomas identified within the spleen.

Adrenals/Urinary Tract: Normal adrenal glands. Right kidney cyst
measures 2 cm. Left kidney appears normal.

Stomach/Bowel: Stomach is within normal limits. No evidence of bowel
wall thickening, distention, or inflammatory changes.

Vascular/Lymphatic: Aortic atherosclerosis. No enlarged abdominal or
pelvic lymph nodes.

Other: There is a supraumbilical hernia which contains fat only,
image number 42 of series 2. Small periumbilical hernia also noted
which contains fat only.

Musculoskeletal: No acute or significant osseous findings.
IMPRESSION: 1. Interval improvement in previously noted pancreatitis. No
complicating features identified. No mass or pseudocyst identified.
2.  Aortic Atherosclerosis ([id number]).

## 2017-11-20 MED ORDER — LEVOTHYROXINE SODIUM 75 MCG PO TABS: ORAL_TABLET | ORAL | 3 refills | Status: DC

## 2017-11-21 MED ORDER — AMPHETAMINE-DEXTROAMPHET ER 20 MG PO CP24: 20.0000 mg | ORAL_CAPSULE | ORAL | 0 refills | Status: DC

## 2017-11-25 LAB — TOXASSURE SELECT 13 (MW), URINE

## 2017-12-19 ENCOUNTER — Encounter: Payer: Self-pay | Admitting: Family Medicine

## 2018-01-18 ENCOUNTER — Encounter: Payer: Self-pay | Admitting: Physician Assistant

## 2018-03-05 ENCOUNTER — Other Ambulatory Visit: Payer: Self-pay | Admitting: Family Medicine

## 2018-03-05 DIAGNOSIS — M797 Fibromyalgia: Secondary | ICD-10-CM

## 2018-03-06 ENCOUNTER — Encounter: Payer: Self-pay | Admitting: Family Medicine

## 2018-03-23 ENCOUNTER — Other Ambulatory Visit: Payer: Self-pay | Admitting: Family Medicine

## 2018-04-11 ENCOUNTER — Ambulatory Visit: Payer: 59 | Admitting: Family Medicine

## 2018-04-11 ENCOUNTER — Other Ambulatory Visit: Payer: Self-pay

## 2018-04-11 ENCOUNTER — Encounter: Payer: Self-pay | Admitting: Family Medicine

## 2018-04-11 DIAGNOSIS — G894 Chronic pain syndrome: Secondary | ICD-10-CM | POA: Diagnosis not present

## 2018-04-11 DIAGNOSIS — M797 Fibromyalgia: Secondary | ICD-10-CM

## 2018-04-11 MED ORDER — PANCRELIPASE (LIP-PROT-AMYL) 36000-114000 UNITS PO CPEP: 36000.0000 [IU] | ORAL_CAPSULE | ORAL | 11 refills | Status: DC

## 2018-04-11 MED ORDER — CITALOPRAM HYDROBROMIDE 20 MG PO TABS: 20.0000 mg | ORAL_TABLET | Freq: Every day | ORAL | 1 refills | Status: DC

## 2018-04-11 MED ORDER — AMPHETAMINE-DEXTROAMPHETAMINE 10 MG PO TABS: 10.0000 mg | ORAL_TABLET | Freq: Every day | ORAL | 0 refills | Status: DC

## 2018-04-11 MED ORDER — PREGABALIN 150 MG PO CAPS: ORAL_CAPSULE | ORAL | 2 refills | Status: DC

## 2018-04-11 MED ORDER — AMPHETAMINE-DEXTROAMPHETAMINE 10 MG PO TABS: 10.0000 mg | ORAL_TABLET | Freq: Two times a day (BID) | ORAL | 0 refills | Status: DC

## 2018-04-11 MED ORDER — TELMISARTAN-HCTZ 80-12.5 MG PO TABS: 1.0000 | ORAL_TABLET | Freq: Every day | ORAL | 1 refills | Status: DC

## 2018-04-11 MED ORDER — TRAMADOL HCL 50 MG PO TABS: ORAL_TABLET | ORAL | 2 refills | Status: DC

## 2018-04-11 MED ORDER — BUDESONIDE-FORMOTEROL FUMARATE 160-4.5 MCG/ACT IN AERO: 2.0000 | INHALATION_SPRAY | Freq: Two times a day (BID) | RESPIRATORY_TRACT | 12 refills | Status: DC

## 2018-04-11 MED ORDER — CARISOPRODOL 350 MG PO TABS: 350.0000 mg | ORAL_TABLET | Freq: Four times a day (QID) | ORAL | 2 refills | Status: DC | PRN

## 2018-04-11 MED ORDER — ESOMEPRAZOLE MAGNESIUM 40 MG PO CPDR: DELAYED_RELEASE_CAPSULE | ORAL | 0 refills | Status: DC

## 2018-04-11 NOTE — Progress Notes (Signed)
Subjective:  By signing my name below, I, Gloria Carter, attest that this documentation has been prepared under the direction and in the presence of Gloria Sorenson, MD. Electronically Signed: Stann Carter, Scribe. 04/11/2018 , 6:45 PM .  Patient was seen in Room 2 .   Patient ID: Gloria Carter, female    DOB: Nov 26, 1965, 52 y.o.   MRN: [id number] Chief Complaint  Patient presents with  . wants to discuss adderall, citalopram and telmisartan dosage   HPI Gloria Carter is a 52 y.o. female who presents to Primary Care at [facility] for medication discussion. She's been dealing fibromyalgia flare for the past 5 months.   HTN Patient was wondering if she can switch back to Telmisartan because she's retaining fluid in her lower extremities. On 04/16/17, she was switched from Telmisartan to HCTZ, and then she was taken off of HCTZ at the hospital due to low sodium and potassium. She denies personal history of gout. She checks her BP outside of office, and it runs 120s/70s.   ADD She ran out of her Adderall for the past 3 weeks. She wants to switch back to Adderall IR tablets instead of Adderall XR because "she likes them better." She was started on Adderall XR 20 mg. She was previously on Adderall IR 10 mg bid. Adderall scripts filled today (7/2), 7/31, and 8/30.   Anxiety/depression She tried taking Celexa 40 mg, but thought it was too much for her, so she's been breaking down to half tablet 20 mg.   Asthma She's still using Advair 500-50 mcg 1 puff bid, as well as albuterol 2-3 times a day. She's also using Zyrtec. She hasn't been using her azelastine eye drops as much, not everyday.   Pancreatitis and heartburn She's doing well on Creon. She's also taking Nexium for heartburn.   Migraine headaches She's been doing well with her migraines by avoiding triggers.   Chronic pain She takes Lyrica and Tramadol for pain, especially in the afternoons after sitting down for an extended period  of time at work.   Past Medical History:  Diagnosis Date  . ADD (attention deficit disorder)   . Allergy    ENVIRONMENTAL  . Arthritis    knees  . Asthma   . Depression   . Difficulty sleeping   . Dysphagia   . Fibromyalgia   . GERD (gastroesophageal reflux disease)   . Hernia of abdominal wall   . Hyperlipidemia   . Hypertension   . IBS (irritable bowel syndrome)   . Migraine   . Numbness and tingling in hands   . Obesity   . Swelling of both ankles   . Thyroid disease    Past Surgical History:  Procedure Laterality Date  . INSERTION OF MESH N/A 03/28/2014   Procedure: INSERTION OF MESH;  Surgeon: Velora Heckler, MD;  Location: [facility];  Service: General;  Laterality: N/A;  . TONSILECTOMY/ADENOIDECTOMY WITH MYRINGOTOMY    . VENTRAL HERNIA REPAIR N/A 03/28/2014   Procedure: LAPAROSCOPIC VENTRAL HERNIA REPAIR WITH MESH;  Surgeon: Velora Heckler, MD;  Location: [facility];  Service: General;  Laterality: N/A;  . WISDOM TOOTH EXTRACTION     Prior to Admission medications   Medication Sig Start Date End Date Taking? Authorizing Provider  albuterol (PROVENTIL HFA;VENTOLIN HFA) 108 (90 Base) MCG/ACT inhaler Inhale 2 puffs into the lungs every 4 (four) hours as needed for wheezing or shortness of breath. 11/19/17  Yes Sherren Mocha, MD  amphetamine-dextroamphetamine (ADDERALL XR) 20  MG 24 hr capsule Take 1 capsule (20 mg total) by mouth every morning. 12/17/17  Yes Sherren Mocha, MD  amphetamine-dextroamphetamine (ADDERALL XR) 20 MG 24 hr capsule Take 1 capsule (20 mg total) by mouth every morning. 01/14/18  Yes Sherren Mocha, MD  azelastine (OPTIVAR) 0.05 % ophthalmic solution PLACE TWO DRIPS INTO BOTH EYES TWICE DAILY FOR DRY EYES 11/19/17  Yes Sherren Mocha, MD  b complex vitamins tablet Take 1 tablet by mouth daily.   Yes [provider]  Biotin 1000 MCG CHEW Chew 5,000 mcg by mouth.   Yes [provider]  Calcium-Magnesium-Zinc [phone] MG TABS Take by mouth.   Yes [provider]  carisoprodol (SOMA) 350 MG tablet Take 1 tablet (350 mg total) by mouth 4 (four) times daily as needed for muscle spasms. 11/19/17  Yes Sherren Mocha, MD  cetirizine (ZYRTEC) 10 MG tablet Take 10 mg by mouth daily as needed for allergies.    Yes [provider]  cholecalciferol (VITAMIN D) 400 UNITS TABS Take 400 Units by mouth daily.   Yes [provider]  citalopram (CELEXA) 40 MG tablet Take 1 tablet (40 mg total) by mouth daily. 11/19/17  Yes Sherren Mocha, MD  esomeprazole (NEXIUM) 40 MG capsule TAKE 1 CAPSULE BY MOUTH EVERY DAY 11/19/17  Yes Sherren Mocha, MD  Fluticasone-Salmeterol (ADVAIR DISKUS) 500-50 MCG/DOSE AEPB TAKE 1 PUFF BY MOUTH TWICE A DAY 11/19/17  Yes Sherren Mocha, MD  levothyroxine (SYNTHROID) 75 MCG tablet TAKE 1 TABLET (75 MCG TOTAL) BY MOUTH DAILY BEFORE BREAKFAST. 11/20/17  Yes Sherren Mocha, MD  lipase/protease/amylase (CREON) 36000 UNITS CPEP capsule Take 1 capsule (36,000 Units total) by mouth as directed. 4 capsules with a meal And 1 capsule with snacks 05/24/17  Yes Lemmon, Gloria Baldy, PA  LYRICA 150 MG capsule TAKE 1 CAPSULE (150 MG TOTAL) BY MOUTH 3 (THREE) TIMES DAILY. 03/10/18  Yes Sherren Mocha, MD  montelukast (SINGULAIR) 10 MG tablet Take 1 tablet (10 mg total) by mouth every morning. 11/19/17  Yes Sherren Mocha, MD  Multiple Vitamin (MULTIVITAMIN) tablet Take 1 tablet by mouth daily.   Yes [provider]  ranitidine (ZANTAC) 150 MG tablet Take 150 mg by mouth 2 (two) times daily as needed for heartburn.   Yes [provider]  SUMAtriptan (IMITREX) 100 MG tablet Take 1 tablet (100 mg total) by mouth every 2 (two) hours as needed for migraine. No more than 2 doses in 24 hours. 11/19/17  Yes Sherren Mocha, MD  telmisartan (MICARDIS) 80 MG tablet Take 1 tablet (80 mg total) by mouth daily. 11/19/17  Yes Sherren Mocha, MD  traMADol Gloria Carter) 50 MG tablet TAKE 1 TABLET 4 TIMES A DAY 11/19/17  Yes Sherren Mocha, MD  vitamin C (ASCORBIC ACID) 500 MG  tablet Take 1,000 mg by mouth daily.   Yes [provider]  vitamin E 400 UNIT capsule Take 400 Units by mouth daily.   Yes [provider]  hyoscyamine (CYSTOSPAZ) 0.15 MG tablet Take 0.15 mg by mouth every 4 (four) hours as needed.  08/13/12  [provider]   Allergies  Allergen Reactions  . Soap     Any soaps with fragrance cause migraines  . Sulfa Antibiotics Nausea And Vomiting   Family History  Problem Relation Age of Onset  . COPD Mother   . Fibromyalgia Mother   . Heart disease Father   . Colon polyps  Father   . Stroke Sister   . Fibromyalgia Sister   . Colon polyps Maternal Grandmother   . Lung disease Paternal Grandmother   . Stroke Paternal Grandmother   . Diabetes Paternal Grandfather   . Heart disease Paternal Grandfather   . Diabetes Maternal Uncle   . Colon polyps Maternal Aunt   . Aortic aneurysm Maternal Aunt   . Cystic kidney disease Maternal Aunt        kidney cyst  . Kidney disease Maternal Aunt   . Fibromyalgia Unknown        cousin  . Colon cancer Neg Hx    Social History   Socioeconomic History  . Marital status: Divorced    Spouse name: Not on file  . Number of children: 0  . Years of education: Not on file  . Highest education level: Not on file  Occupational History  . Occupation: Hospital doctor: [employer]  Social Needs  . Financial resource strain: Not on file  . Food insecurity:    Worry: Not on file    Inability: Not on file  . Transportation needs:    Medical: Not on file    Non-medical: Not on file  Tobacco Use  . Smoking status: Never Smoker  . Smokeless tobacco: Never Used  Substance and Sexual Activity  . Alcohol use: No    Alcohol/week: 0.0 oz  . Drug use: No  . Sexual activity: Yes  Lifestyle  . Physical activity:    Days per week: Not on file    Minutes per session: Not on file  . Stress: Not on file  Relationships  . Social connections:    Talks on phone: Not on file     Gets together: Not on file    Attends religious service: Not on file    Active member of club or organization: Not on file    Attends meetings of clubs or organizations: Not on file    Relationship status: Not on file  Other Topics Concern  . Not on file  Social History Narrative   Married. Education: [employer].    Depression screen [facility] 2/9 04/11/2018 11/19/2017 11/19/2017 08/08/2017 06/27/2017  Decreased Interest 0 0 0 0 0  Down, Depressed, Hopeless 0 0 0 0 0  PHQ - 2 Score 0 0 0 0 0    Review of Systems  Constitutional: Negative for chills, fatigue, fever and unexpected weight change.  Respiratory: Negative for cough.   Gastrointestinal: Negative for constipation, diarrhea, nausea and vomiting.  Skin: Negative for rash and wound.  Neurological: Negative for dizziness, weakness and headaches.       Objective:   Physical Exam  Constitutional: She is oriented to person, place, and time. She appears well-developed and well-nourished. No distress.  HENT:  Head: Normocephalic and atraumatic.  Eyes: Pupils are equal, round, and reactive to light. EOM are normal.  Neck: Neck supple.  Cardiovascular: Normal rate.  Pulmonary/Chest: Effort normal. No respiratory distress.  Musculoskeletal: Normal range of motion.  Neurological: She is alert and oriented to person, place, and time.  Skin: Skin is warm and dry.  Psychiatric: She has a normal mood and affect. Her behavior is normal.  Nursing note and vitals reviewed.   BP 129/84 (BP Location: Right Arm, Patient Position: Sitting, Cuff Size: Large)   Pulse 100   Temp 97.8 F (36.6 C) (Oral)   Resp 16   Ht 5\' 2"  (1.575 m)   Wt 275 lb  6.4 oz (124.9 kg)   SpO2 95%   BMI 50.37 kg/m      Assessment & Plan:   1. Fibromyalgia   2. Chronic pain syndrome     No orders of the defined types were placed in this encounter.   Meds ordered this encounter  Medications  . telmisartan-hydrochlorothiazide (MICARDIS HCT) 80-12.5 MG tablet     Sig: Take 1 tablet by mouth daily.    Dispense:  90 tablet    Refill:  1  . amphetamine-dextroamphetamine (ADDERALL) 10 MG tablet    Sig: Take 1 tablet (10 mg total) by mouth daily with breakfast.    Dispense:  60 tablet    Refill:  0  . amphetamine-dextroamphetamine (ADDERALL) 10 MG tablet    Sig: Take 1 tablet (10 mg total) by mouth 2 (two) times daily with a meal.    Dispense:  60 tablet    Refill:  0  . amphetamine-dextroamphetamine (ADDERALL) 10 MG tablet    Sig: Take 1 tablet (10 mg total) by mouth 2 (two) times daily with a meal.    Dispense:  60 tablet    Refill:  0  . citalopram (CELEXA) 20 MG tablet    Sig: Take 1 tablet (20 mg total) by mouth daily.    Dispense:  90 tablet    Refill:  1    Patient does not need med currently. Please hold on file for when she calls. Please d/c prior rx for 40mg  dose.  . carisoprodol (SOMA) 350 MG tablet    Sig: Take 1 tablet (350 mg total) by mouth 4 (four) times daily as needed for muscle spasms.    Dispense:  120 tablet    Refill:  2    Do not fill more frequently than every 29 days  . budesonide-formoterol (SYMBICORT) 160-4.5 MCG/ACT inhaler    Sig: Inhale 2 puffs into the lungs 2 (two) times daily.    Dispense:  1 Inhaler    Refill:  12  . pregabalin (LYRICA) 150 MG capsule    Sig: TAKE 1 CAPSULE (150 MG TOTAL) BY MOUTH 3 (THREE) TIMES DAILY.    Dispense:  90 capsule    Refill:  2    Please cancel prior rx on file for this med - want to align with other med refills.  Marland Kitchen. esomeprazole (NEXIUM) 40 MG capsule    Sig: TAKE 1 CAPSULE BY MOUTH EVERY DAY    Dispense:  90 capsule    Refill:  0  . DISCONTD: lipase/protease/amylase (CREON) 36000 UNITS CPEP capsule    Sig: Take 1 capsule (36,000 Units total) by mouth as directed. 4 capsules with a meal And 1 capsule with snacks    Dispense:  390 capsule    Refill:  11  . traMADol (ULTRAM) 50 MG tablet    Sig: TAKE 1 TABLET 4 TIMES A DAY    Dispense:  120 tablet    Refill:  2    Do not  fill more frequently than every 29 days   I personally performed the services described in this documentation, which was scribed in my presence. The recorded information has been reviewed and considered, and addended by me as needed.   Gloria SorensonEva Shaw, M.D.  Primary Care at [facility]  [place] [address] [address] [place], [place] [address] [phone] phone [phone] fax  07/28/18 9:04 AM

## 2018-04-11 NOTE — Patient Instructions (Addendum)
IF you received an x-ray today, you will receive an invoice from [facility]. Please contact [facility] at [phone] with questions or concerns regarding your invoice.   IF you received labwork today, you will receive an invoice from [facility]. Please contact [facility] at [phone] with questions or concerns regarding your invoice.   Our billing staff will not be able to assist you with questions regarding bills from these companies.  You will be contacted with the lab results as soon as they are available. The fastest way to get your results is to activate your My Chart account. Instructions are located on the last page of this paperwork. If you have not heard from us regarding the results in 2 weeks, please contact this office.        IF you received an x-ray today, you will receive an invoice from [facility]. Please contact [facility] at [phone] with questions or concerns regarding your invoice.   IF you received labwork today, you will receive an invoice from [facility]. Please contact [facility] at [phone] with questions or concerns regarding your invoice.   Our billing staff will not be able to assist you with questions regarding bills from these companies.  You will be contacted with the lab results as soon as they are available. The fastest way to get your results is to activate your My Chart account. Instructions are located on the last page of this paperwork. If you have not heard from us regarding the results in 2 weeks, please contact this office.        We recommend that you schedule a mammogram for breast cancer screening. Typically, you do not need a referral to do this. Please contact a local imaging center to schedule your mammogram.  [facility] - [phone]  *ask for the Radiology Department [facility] - [phone] or [phone]  [facility] - [phone]  [phone] [facility] - [phone] [facility] - [phone]  *ask for the Radiology Department [facility] - [phone]  *ask for the Radiology Department [facility] - [phone]  *ask for the Mammography Department [facility] - [phone]) [phone]    Chronic Pain, Adult Chronic pain is a type of pain that lasts or keeps coming back (recurs) for at least six months. You may have chronic headaches, abdominal pain, or body pain. Chronic pain may be related to an illness, such as fibromyalgia or complex regional pain syndrome. Sometimes the cause of chronic pain is not known. Chronic pain can make it hard for you to do daily activities. If not treated, chronic pain can lead to other health problems, including anxiety and depression. Treatment depends on the cause and severity of your pain. You may need to work with a pain specialist to come up with a treatment plan. The plan may include medicine, counseling, and physical therapy. Many people benefit from a combination of two or more types of treatment to control their pain. Follow these instructions at home: Lifestyle  Consider keeping a pain diary to share with your health care providers.  Consider talking with a mental health care provider (psychologist) about how to cope with chronic pain.  Consider joining a chronic pain support group.  Try to control or lower your stress levels. Talk to your health care provider about strategies to do this. General instructions   Take over-the-counter and prescription medicines only as told by your health  care provider.  Follow your treatment plan as told by your health care provider. This may include: ? Gentle, regular exercise. ? Eating a healthy diet that includes foods such as vegetables, fruits, fish, and lean meats. ? Cognitive or behavioral therapy. ? Working with a Adult nurse. ? Meditation or yoga. ? Acupuncture or  massage therapy. ? Aroma, color, light, or sound therapy. ? Local electrical stimulation. ? Shots (injections) of numbing or pain-relieving medicines into the spine or the area of pain.  Check your pain level as told by your health care provider. Ask your health care provider if you should use a pain scale.  Learn as much as you can about how to manage your chronic pain. Ask your health care provider if an intensive pain rehabilitation program or a chronic pain specialist would be helpful.  Keep all follow-up visits as told by your health care provider. This is important. Contact a health care provider if:  Your pain gets worse.  You have new pain.  You have trouble sleeping.  You have trouble doing your normal activities.  Your pain is not controlled with treatment.  Your have side effects from pain medicine.  You feel weak. Get help right away if:  You lose feeling or have numbness in your body.  You lose control of bowel or bladder function.  Your pain suddenly gets much worse.  You develop shaking or chills.  You develop confusion.  You develop chest pain.  You have trouble breathing or shortness of breath.  You pass out.  You have thoughts about hurting yourself or others. This information is not intended to replace advice given to you by your health care provider. Make sure you discuss any questions you have with your health care provider. Document Released: 06/19/2002 Document Revised: 05/27/2016 Document Reviewed: 03/16/2016 Elsevier Interactive Patient Education  [employer].

## 2018-04-18 ENCOUNTER — Telehealth: Payer: Self-pay | Admitting: Family Medicine

## 2018-04-18 NOTE — Telephone Encounter (Signed)
Copied from CRM [id number]#[id number]. Topic: Quick Communication - See Telephone Encounter >> Apr 18, 2018  5:08 PM Lorrine KinMcGee, Yury Schaus B, NT wrote: CRM for notification. See Telephone encounter for: 04/18/18. Patient calling and states that [facility] [address] could not be filled because they are out of the medication and will not have it back in stock until end of next week (04/28/18). Would like the 3 month supply to be sent to [id number] [place]- [place], [place] - [address] CB#: [phone] or [phone]

## 2018-04-21 NOTE — Telephone Encounter (Signed)
Please see note below. 

## 2018-06-24 ENCOUNTER — Other Ambulatory Visit: Payer: Self-pay | Admitting: Physician Assistant

## 2018-08-31 ENCOUNTER — Telehealth: Payer: Self-pay | Admitting: Family Medicine

## 2018-08-31 NOTE — Telephone Encounter (Signed)
Patient called and stated she is trying to get an appointment with Dr Clelia CroftShaw to get a medication refill but she is booked out until the end of DEC so patient wants to know if she can get a refill sent in on her Adderall to last her until she can get an appointment.

## 2018-08-31 NOTE — Telephone Encounter (Signed)
pls see note 

## 2018-09-04 ENCOUNTER — Other Ambulatory Visit: Payer: Self-pay | Admitting: Family Medicine

## 2018-09-05 NOTE — Telephone Encounter (Signed)
Patient is requesting a refill of the following medications: Requested Prescriptions   Pending Prescriptions Disp Refills  . amphetamine-dextroamphetamine (ADDERALL) 10 MG tablet 60 tablet 0    Sig: Take 1 tablet (10 mg total) by mouth 2 (two) times daily with a meal.    Date of patient request: 09/04/2018 Last office visit: 04/11/2018 Date of last refill: 05/10/2018 Last refill amount: 60 Follow up time period per chart:

## 2018-09-12 ENCOUNTER — Other Ambulatory Visit: Payer: Self-pay | Admitting: Family Medicine

## 2018-09-12 MED ORDER — AMPHETAMINE-DEXTROAMPHETAMINE 10 MG PO TABS
10.0000 mg | ORAL_TABLET | Freq: Two times a day (BID) | ORAL | 0 refills | Status: DC
Start: 2018-09-12 — End: 2018-09-26

## 2018-09-12 NOTE — Telephone Encounter (Signed)
Today I have utilized the [place] Controlled Substance Registry's online query to confirm compliance regarding the patient's controlled medications. My review reveals appropriate prescription fills and that I am the sole provider of these medications. Rechecks will occur regularly and the patient is aware of our use of the system.  Last refilled #30 adderall 10 on 11/5 written 7/2.  Prior to that 9/1 written 7/2.  Pt has appt sched for 12/17.

## 2018-09-13 NOTE — Telephone Encounter (Signed)
Requested Prescriptions  Pending Prescriptions Disp Refills  . azelastine (OPTIVAR) 0.05 % ophthalmic solution [Pharmacy Med Name: AZELASTINE HCL 0.05% DROPS] 6 mL 3    Sig: PLACE TWO DROPS INTO BOTH EYES TWICE DAILY FOR DRY EYES     Ophthalmology:  Antiallergy Passed - 09/12/2018  2:35 PM      Passed - Valid encounter within last 12 months    Recent Outpatient Visits          5 months ago Fibromyalgia   [facility] at [place] [facility] [facility], Levell JulyEva N, MD   9 months ago Recurrent pancreatitis [facility](HCC)   [facility] at [facility] [facility], Levell JulyEva N, MD   1 year ago Idiopathic acute pancreatitis without infection or necrosis   [facility] at [facility], Levell JulyEva N, MD   1 year ago Idiopathic acute pancreatitis without infection or necrosis   [facility] at [facility], Levell JulyEva N, MD   1 year ago Idiopathic acute pancreatitis without infection or necrosis   [facility], Levell JulyEva N, MD      Future Appointments            In 1 week Sherren MochaShaw, Eva N, MD [facility] at [place]         . esomeprazole (NEXIUM) 40 MG capsule [Pharmacy Med Name: ESOMEPRAZOLE MAG DR 40 MG CAP] 30 capsule 2    Sig: TAKE 1 CAPSULE BY MOUTH EVERY DAY     Gastroenterology: Proton Pump Inhibitors Passed - 09/12/2018  2:35 PM      Passed - Valid encounter within last 12 months    Recent Outpatient Visits          5 months ago Fibromyalgia   [facility] at [facility], Levell JulyEva N, MD   9 months ago Recurrent pancreatitis [facility](HCC)   [facility] at [place] [facility], Levell JulyEva N, MD   1 year ago Idiopathic acute pancreatitis without infection or necrosis   [facility], Levell JulyEva N, MD   1 year ago Idiopathic acute pancreatitis without infection or necrosis   [facility], Levell JulyEva N, MD   1 year ago Idiopathic acute pancreatitis without infection or necrosis   [facility], Levell JulyEva N, MD      Future Appointments            In 1 week Sherren MochaShaw, Eva N, MD [facility] [place]

## 2018-09-13 NOTE — Telephone Encounter (Signed)
Please advise 

## 2018-09-18 ENCOUNTER — Encounter: Payer: Self-pay | Admitting: Family Medicine

## 2018-09-18 NOTE — Telephone Encounter (Signed)
Pt notified about this on MyChart

## 2018-09-18 NOTE — Telephone Encounter (Signed)
On 12/3 a 15d supply was sent in which should get her to her appt that was in 14d on 12/17.  She will get additional refills at that appointment.

## 2018-09-18 NOTE — Telephone Encounter (Signed)
On 12/3 a 15d supply was sent in which should get her to her appt that was in 14d on 12/17.  She will get additional refills at that appointment. 

## 2018-09-26 ENCOUNTER — Ambulatory Visit (INDEPENDENT_AMBULATORY_CARE_PROVIDER_SITE_OTHER): Payer: 59

## 2018-09-26 ENCOUNTER — Encounter: Payer: Self-pay | Admitting: Family Medicine

## 2018-09-26 ENCOUNTER — Ambulatory Visit: Payer: 59 | Admitting: Family Medicine

## 2018-09-26 ENCOUNTER — Other Ambulatory Visit: Payer: Self-pay

## 2018-09-26 VITALS — BP 132/89 | HR 103 | Temp 98.0°F | Resp 16 | Ht 62.8 in | Wt 281.0 lb

## 2018-09-26 DIAGNOSIS — E039 Hypothyroidism, unspecified: Secondary | ICD-10-CM | POA: Diagnosis not present

## 2018-09-26 DIAGNOSIS — R7303 Prediabetes: Secondary | ICD-10-CM | POA: Diagnosis not present

## 2018-09-26 DIAGNOSIS — E559 Vitamin D deficiency, unspecified: Secondary | ICD-10-CM | POA: Diagnosis not present

## 2018-09-26 DIAGNOSIS — R05 Cough: Secondary | ICD-10-CM

## 2018-09-26 DIAGNOSIS — K861 Other chronic pancreatitis: Secondary | ICD-10-CM | POA: Diagnosis not present

## 2018-09-26 DIAGNOSIS — J45909 Unspecified asthma, uncomplicated: Secondary | ICD-10-CM

## 2018-09-26 DIAGNOSIS — J0141 Acute recurrent pansinusitis: Secondary | ICD-10-CM

## 2018-09-26 DIAGNOSIS — R059 Cough, unspecified: Secondary | ICD-10-CM

## 2018-09-26 DIAGNOSIS — Z9109 Other allergy status, other than to drugs and biological substances: Secondary | ICD-10-CM

## 2018-09-26 DIAGNOSIS — I10 Essential (primary) hypertension: Secondary | ICD-10-CM

## 2018-09-26 DIAGNOSIS — Z1212 Encounter for screening for malignant neoplasm of rectum: Secondary | ICD-10-CM

## 2018-09-26 DIAGNOSIS — Z1231 Encounter for screening mammogram for malignant neoplasm of breast: Secondary | ICD-10-CM

## 2018-09-26 DIAGNOSIS — M797 Fibromyalgia: Secondary | ICD-10-CM

## 2018-09-26 DIAGNOSIS — Z1211 Encounter for screening for malignant neoplasm of colon: Secondary | ICD-10-CM

## 2018-09-26 LAB — POCT URINALYSIS DIP (MANUAL ENTRY)
Bilirubin, UA: NEGATIVE
Glucose, UA: NEGATIVE mg/dL
Ketones, POC UA: NEGATIVE mg/dL
Leukocytes, UA: NEGATIVE
Nitrite, UA: NEGATIVE
Protein Ur, POC: NEGATIVE mg/dL
Spec Grav, UA: 1.015 (ref 1.010–1.025)
Urobilinogen, UA: 0.2 E.U./dL
pH, UA: 7 (ref 5.0–8.0)

## 2018-09-26 LAB — POCT CBC
GRANULOCYTE PERCENT: 68.3 % (ref 37–80)
HCT, POC: 37.5 % (ref 29–41)
Hemoglobin: 12.6 g/dL (ref 11–14.6)
Lymph, poc: 2 (ref 0.6–3.4)
MCH, POC: 30.3 pg (ref 27–31.2)
MCHC: 33.7 g/dL (ref 31.8–35.4)
MCV: 89.9 fL (ref 76–111)
MID (cbc): 0.4 (ref 0–0.9)
MPV: 9 fL (ref 0–99.8)
POC Granulocyte: 5.1 (ref 2–6.9)
POC LYMPH PERCENT: 26.5 %L (ref 10–50)
POC MID %: 5.2 %M (ref 0–12)
Platelet Count, POC: 199 10*3/uL (ref 142–424)
RBC: 4.17 M/uL (ref 4.04–5.48)
RDW, POC: 13.1 %
WBC: 7.4 10*3/uL (ref 4.6–10.2)

## 2018-09-26 MED ORDER — TELMISARTAN-HCTZ 80-25 MG PO TABS: 1.0000 | ORAL_TABLET | Freq: Every day | ORAL | 3 refills | Status: DC

## 2018-09-26 MED ORDER — TRAMADOL HCL 50 MG PO TABS: ORAL_TABLET | ORAL | 2 refills | Status: DC

## 2018-09-26 MED ORDER — BENZONATATE 200 MG PO CAPS: 200.0000 mg | ORAL_CAPSULE | Freq: Three times a day (TID) | ORAL | 0 refills | Status: DC | PRN

## 2018-09-26 MED ORDER — IPRATROPIUM BROMIDE 0.03 % NA SOLN: 2.0000 | Freq: Four times a day (QID) | NASAL | 0 refills | Status: DC | PRN

## 2018-09-26 MED ORDER — PREDNISONE 20 MG PO TABS: ORAL_TABLET | ORAL | 0 refills | Status: DC

## 2018-09-26 MED ORDER — AMPHETAMINE-DEXTROAMPHETAMINE 10 MG PO TABS: 10.0000 mg | ORAL_TABLET | Freq: Two times a day (BID) | ORAL | 0 refills | Status: DC

## 2018-09-26 MED ORDER — MONTELUKAST SODIUM 10 MG PO TABS: 10.0000 mg | ORAL_TABLET | Freq: Every morning | ORAL | 3 refills | Status: DC

## 2018-09-26 MED ORDER — AMPHETAMINE-DEXTROAMPHETAMINE 10 MG PO TABS: 10.0000 mg | ORAL_TABLET | Freq: Every day | ORAL | 0 refills | Status: DC

## 2018-09-26 MED ORDER — LEVOTHYROXINE SODIUM 75 MCG PO TABS: 75.0000 ug | ORAL_TABLET | Freq: Every day | ORAL | 3 refills | Status: DC

## 2018-09-26 MED ORDER — PREGABALIN 150 MG PO CAPS: ORAL_CAPSULE | ORAL | 2 refills | Status: DC

## 2018-09-26 MED ORDER — AZITHROMYCIN 250 MG PO TABS: ORAL_TABLET | ORAL | 0 refills | Status: DC

## 2018-09-26 NOTE — Patient Instructions (Addendum)
You need to schedule your mammogram.  [facility] at [phone] to schedule.  Please make an appointment with your gynecologist for your routine Pap smear and have them send me a copy of the results so we can get this into your chart before your next office visit in 3 months.  I have sent a prescription for a Z-Pak antibiotic and a course of prednisone to your pharmacy as well as a cough medicine.  Continue the Symbicort and using your inhaler as frequently as possible as well as the Singulair.  I have referred you to an allergy and asthma doctor as your symptoms are uncontrolled on your current medication regimen and so you would benefit from a specialist to help us fine-tune things to get you breathing better on a more regular basis and help avoid future heart and lung problems from uncontrolled asthma and allergies.       If you have lab work done today you will be contacted with your lab results within the next 2 weeks.  If you have not heard from us then please contact us. The fastest way to get your results is to register for My Chart.   IF you received an x-ray today, you will receive an invoice from [facility]. Please contact [facility] at [phone] with questions or concerns regarding your invoice.   IF you received labwork today, you will receive an invoice from [facility]. Please contact [facility] at [phone] with questions or concerns regarding your invoice.   Our billing staff will not be able to assist you with questions regarding bills from these companies.  You will be contacted with the lab results as soon as they are available. The fastest way to get your results is to activate your My Chart account. Instructions are located on the last page of this paperwork. If you have not heard from us regarding the results in 2 weeks, please contact this office.

## 2018-09-26 NOTE — Progress Notes (Signed)
Subjective:    Patient: Gloria Carter  DOB: 1966-02-10; 52 y.o.   MRN: [id number]  Chief Complaint  Patient presents with  . Medication Management  . Sinus Problem    congestion,drainage, and cough x 3 weeks     HPI Has a flair of pancreatitis on 11/9 which lasted 3-4d and she thinks it was triggered by high fat frozen Stoffers casserole which she had eaten for 4d in a row.   Using lyrica 150 - tries to take 3x/d but sometimes misses the evening dose.   Is tired a lot as she gets home from work and doesn't move.  Tries to schedule time to do things.  She spends most of her Saturday sleeping and then awake overnight.  She is taking albuterol 1-2x/day and has been coughing and congestion for the past 3 weeks. Was needing 4x/day over the summer.  Takes 2 puffs symbicort twice a day regularly as well. She does have 3 cats for over the past year and has a cat allergy  Taking creon with every meal - 2 before a high fat snack.    Taking 2 of the vitamin D but not sure of dose  Rarely using otc zantac - she will switch to pepcid.  Only 4 migraines in the past 6 mos  Occasionally checking BP at home - 110-140/75-85.  Feeling for 3 weeks with fever, chills, occasional blood in mucous, with some sinus pain, pressure in temples, fontal sinus, and in ears.  Also having right maxillary pain from right tooth that needs root canal.  Is coughing up yellowish-greenish sputum.    Medical History Past Medical History:  Diagnosis Date  . ADD (attention deficit disorder)   . Allergy    ENVIRONMENTAL  . Arthritis    knees  . Asthma   . Depression   . Difficulty sleeping   . Dysphagia   . Fibromyalgia   . GERD (gastroesophageal reflux disease)   . Hernia of abdominal wall   . Hyperlipidemia   . Hypertension   . IBS (irritable bowel syndrome)   . Migraine   . Numbness and tingling in hands   . Obesity   . Swelling of both ankles   . Thyroid disease    Past Surgical  History:  Procedure Laterality Date  . INSERTION OF MESH N/A 03/28/2014   Procedure: INSERTION OF MESH;  Surgeon: Velora Heckler, MD;  Location: [facility];  Service: General;  Laterality: N/A;  . TONSILECTOMY/ADENOIDECTOMY WITH MYRINGOTOMY    . VENTRAL HERNIA REPAIR N/A 03/28/2014   Procedure: LAPAROSCOPIC VENTRAL HERNIA REPAIR WITH MESH;  Surgeon: Velora Heckler, MD;  Location: [facility];  Service: General;  Laterality: N/A;  . WISDOM TOOTH EXTRACTION     Current Outpatient Medications on File Prior to Visit  Medication Sig Dispense Refill  . albuterol (PROVENTIL HFA;VENTOLIN HFA) 108 (90 Base) MCG/ACT inhaler Inhale 2 puffs into the lungs every 4 (four) hours as needed for wheezing or shortness of breath. 18 Inhaler 11  . amphetamine-dextroamphetamine (ADDERALL) 10 MG tablet Take 1 tablet (10 mg total) by mouth daily with breakfast. 60 tablet 0  . amphetamine-dextroamphetamine (ADDERALL) 10 MG tablet Take 1 tablet (10 mg total) by mouth 2 (two) times daily with a meal. 60 tablet 0  . amphetamine-dextroamphetamine (ADDERALL) 10 MG tablet Take 1 tablet (10 mg total) by mouth 2 (two) times daily with a meal. 30 tablet 0  . azelastine (OPTIVAR) 0.05 % ophthalmic solution PLACE TWO DROPS  INTO BOTH EYES TWICE DAILY FOR DRY EYES 6 mL 3  . b complex vitamins tablet Take 1 tablet by mouth daily.    . Biotin 1000 MCG CHEW Chew 5,000 mcg by mouth.    . budesonide-formoterol (SYMBICORT) 160-4.5 MCG/ACT inhaler Inhale 2 puffs into the lungs 2 (two) times daily. 1 Inhaler 12  . Calcium-Magnesium-Zinc 167-83-8 MG TABS Take by mouth.    . carisoprodol (SOMA) 350 MG tablet Take 1 tablet (350 mg total) by mouth 4 (four) times daily as needed for muscle spasms. 120 tablet 2  . cetirizine (ZYRTEC) 10 MG tablet Take 10 mg by mouth daily as needed for allergies.     . cholecalciferol (VITAMIN D) 400 UNITS TABS Take 400 Units by mouth daily.    . citalopram (CELEXA) 20 MG tablet Take 1 tablet (20 mg total) by mouth daily.  90 tablet 1  . CREON 36000 units CPEP capsule TAKE 1 CAPSULES BY MOUTH AS DIRECTED 4 CAPS WITH A MEAL AND 1 CAP WITH SNACKS 390 capsule 11  . esomeprazole (NEXIUM) 40 MG capsule TAKE 1 CAPSULE BY MOUTH EVERY DAY 90 capsule 0  . levothyroxine (SYNTHROID) 75 MCG tablet TAKE 1 TABLET (75 MCG TOTAL) BY MOUTH DAILY BEFORE BREAKFAST. 90 tablet 3  . montelukast (SINGULAIR) 10 MG tablet Take 1 tablet (10 mg total) by mouth every morning. 90 tablet 3  . Multiple Vitamin (MULTIVITAMIN) tablet Take 1 tablet by mouth daily.    . pregabalin (LYRICA) 150 MG capsule TAKE 1 CAPSULE (150 MG TOTAL) BY MOUTH 3 (THREE) TIMES DAILY. 90 capsule 2  . ranitidine (ZANTAC) 150 MG tablet Take 150 mg by mouth 2 (two) times daily as needed for heartburn.    . SUMAtriptan (IMITREX) 100 MG tablet Take 1 tablet (100 mg total) by mouth every 2 (two) hours as needed for migraine. No more than 2 doses in 24 hours. 8 tablet 3  . telmisartan-hydrochlorothiazide (MICARDIS HCT) 80-12.5 MG tablet Take 1 tablet by mouth daily. 90 tablet 1  . traMADol (ULTRAM) 50 MG tablet TAKE 1 TABLET 4 TIMES A DAY 120 tablet 2  . vitamin C (ASCORBIC ACID) 500 MG tablet Take 1,000 mg by mouth daily.    . vitamin E 400 UNIT capsule Take 400 Units by mouth daily.    . [DISCONTINUED] hyoscyamine (CYSTOSPAZ) 0.15 MG tablet Take 0.15 mg by mouth every 4 (four) hours as needed.     No current facility-administered medications on file prior to visit.    Allergies  Allergen Reactions  . Soap     Any soaps with fragrance cause migraines  . Sulfa Antibiotics Nausea And Vomiting   Family History  Problem Relation Age of Onset  . COPD Mother   . Fibromyalgia Mother   . Heart disease Father   . Colon polyps Father   . Stroke Sister   . Fibromyalgia Sister   . Colon polyps Maternal Grandmother   . Lung disease Paternal Grandmother   . Stroke Paternal Grandmother   . Diabetes Paternal Grandfather   . Heart disease Paternal Grandfather   . Diabetes  Maternal Uncle   . Colon polyps Maternal Aunt   . Aortic aneurysm Maternal Aunt   . Cystic kidney disease Maternal Aunt        kidney cyst  . Kidney disease Maternal Aunt   . Fibromyalgia Other        cousin  . Colon cancer Neg Hx    Social History   Socioeconomic History  .  Marital status: Divorced    Spouse name: Not on file  . Number of children: 0  . Years of education: Not on file  . Highest education level: Not on file  Occupational History  . Occupation: Hospital doctor: [employer]  Social Needs  . Financial resource strain: Not on file  . Food insecurity:    Worry: Not on file    Inability: Not on file  . Transportation needs:    Medical: Not on file    Non-medical: Not on file  Tobacco Use  . Smoking status: Never Smoker  . Smokeless tobacco: Never Used  Substance and Sexual Activity  . Alcohol use: No    Alcohol/week: 0.0 standard drinks  . Drug use: No  . Sexual activity: Yes  Lifestyle  . Physical activity:    Days per week: Not on file    Minutes per session: Not on file  . Stress: Not on file  Relationships  . Social connections:    Talks on phone: Not on file    Gets together: Not on file    Attends religious service: Not on file    Active member of club or organization: Not on file    Attends meetings of clubs or organizations: Not on file    Relationship status: Not on file  Other Topics Concern  . Not on file  Social History Narrative   Married. Education: [employer].    Depression screen [facility] 2/9 09/26/2018 04/11/2018 11/19/2017 11/19/2017 08/08/2017  Decreased Interest 0 0 0 0 0  Down, Depressed, Hopeless 0 0 0 0 0  PHQ - 2 Score 0 0 0 0 0    ROS As noted in HPI  Objective:  BP (!) 143/83   Pulse (!) 103   Temp 98 F (36.7 C) (Oral)   Resp 16   Ht 5' 2.8" (1.595 m)   Wt 281 lb (127.5 kg)   SpO2 98%   BMI 50.10 kg/m  Physical Exam Constitutional:      General: She is not in acute distress.    Appearance: She is  well-developed. She is not diaphoretic.     Comments: Morbidly obese, using rolling walker  HENT:     Head: Normocephalic and atraumatic.     Right Ear: External ear normal.     Left Ear: External ear normal.  Eyes:     General: No scleral icterus.    Conjunctiva/sclera: Conjunctivae normal.  Neck:     Musculoskeletal: Normal range of motion and neck supple.     Thyroid: No thyromegaly.  Cardiovascular:     Rate and Rhythm: Normal rate and regular rhythm.     Heart sounds: Normal heart sounds.  Pulmonary:     Effort: Pulmonary effort is normal. No respiratory distress.     Breath sounds: Normal breath sounds.  Lymphadenopathy:     Cervical: No cervical adenopathy.  Skin:    General: Skin is warm and dry.     Findings: No erythema.  Neurological:     Mental Status: She is alert and oriented to person, place, and time.  Psychiatric:        Behavior: Behavior normal.     POC TESTING Office Visit on 09/26/2018  Component Date Value Ref Range Status  . WBC 09/26/2018 7.4  4.6 - 10.2 K/uL Final  . Lymph, poc 09/26/2018 2.0  0.6 - 3.4 Final  . POC LYMPH PERCENT 09/26/2018 26.5  10 - 50 %L Final  .  MID (cbc) 09/26/2018 0.4  0 - 0.9 Final  . POC MID % 09/26/2018 5.2  0 - 12 %M Final  . POC Granulocyte 09/26/2018 5.1  2 - 6.9 Final  . Granulocyte percent 09/26/2018 68.3  37 - 80 %G Final  . RBC 09/26/2018 4.17  4.04 - 5.48 M/uL Final  . Hemoglobin 09/26/2018 12.6  11 - 14.6 g/dL Final  . HCT, POC 40/98/1191 37.5  29 - 41 % Final  . MCV 09/26/2018 89.9  76 - 111 fL Final  . MCH, POC 09/26/2018 30.3  27 - 31.2 pg Final  . MCHC 09/26/2018 33.7  31.8 - 35.4 g/dL Final  . RDW, POC 47/82/9562 13.1  % Final  . Platelet Count, POC 09/26/2018 199  142 - 424 K/uL Final  . MPV 09/26/2018 9.0  0 - 99.8 fL Final    Dg Chest 2 View  Result Date: 09/26/2018 CLINICAL DATA:  Cough with rales EXAM: CHEST - 2 VIEW COMPARISON:  April 22, 2017 FINDINGS: There is no edema or consolidation. Heart  size and pulmonary vascularity are normal. No adenopathy. No bone lesions. IMPRESSION: No edema or consolidation. Electronically Signed   By: Bretta Bang III M.D.   On: 09/26/2018 11:14    Assessment & Plan:   1. Idiopathic chronic pancreatitis (HCC) - has seen Dr. Christella Hartigan for mult documented episodes on CT though lipase and LFTs barely rise.  Was triggered by high fat diet and now all sxs resolved.  2. Acquired hypothyroidism - stable on levothyroxine 75 Lab Results  Component Value Date   TSH 0.779 09/26/2018   TSH 0.853 11/19/2017   TSH 0.805 06/15/2017     3. Prediabetes -  Lab Results  Component Value Date   HGBA1C 5.8 (H) 09/26/2018   HGBA1C 5.6 11/19/2017   HGBA1C 5.6 06/15/2017    4. Vitamin D deficiency   5. Encounter for screening mammogram for breast cancer   6. Fibromyalgia   7. Screening for colorectal cancer   8. Chronic asthma without complication, unspecified asthma severity, unspecified whether persistent   9. Allergy to environmental factors - uncontrolled on symbicort 2 puffs bid with using albuterol inhaler 2-4x/day as well as prn azelastine eye gtts, zyrtec, and Singulair so refer to allergy and asthma - likely exacerbated by her cats  10. Cough   11. Essential hypertension - increase telmisartan-hctz 80-12.5 to 80-25 as could be better controlled.  12.    Acute recurrent pansinusitis - zpack and prednisone   Patient will continue on current chronic medications other than changes noted above, so ok to refill when needed.   See after visit summary for patient specific instructions.  Orders Placed This Encounter  Procedures  . MM DIGITAL SCREENING BILATERAL    Standing Status:   Future    Standing Expiration Date:   11/28/2019    Order Specific Question:   Reason for Exam (SYMPTOM  OR DIAGNOSIS REQUIRED)    Answer:   screening for breast cancer    Order Specific Question:   Preferred imaging location?    Answer:   [facility]    Order Specific  Question:   Is the patient pregnant?    Answer:   No  . DG Chest 2 View    Standing Status:   Future    Number of Occurrences:   1    Standing Expiration Date:   09/26/2019    Order Specific Question:   Reason for Exam (SYMPTOM  OR DIAGNOSIS  REQUIRED)    Answer:   bibasilar rales, productive cough x 1 wks with some blood in sputum    Order Specific Question:   Is the patient pregnant?    Answer:   No    Order Specific Question:   Preferred imaging location?    Answer:   [facility]  . Comprehensive metabolic panel  . Lipase  . Hemoglobin A1c  . TSH  . VITAMIN D 25 Hydroxy (Vit-D Deficiency, Fractures)  . Cologuard  . Ambulatory referral to Allergy    Referral Priority:   Routine    Referral Type:   Allergy Testing    Referral Reason:   Specialty Services Required    Requested Specialty:   Allergy    Number of Visits Requested:   1  . Care order/instruction:    Scheduling Instructions:     Recheck BP  . POCT CBC  . POCT urinalysis dipstick    Meds ordered this encounter  Medications  . pregabalin (LYRICA) 150 MG capsule    Sig: TAKE 1 CAPSULE (150 MG TOTAL) BY MOUTH 3 (THREE) TIMES DAILY.    Dispense:  90 capsule    Refill:  2    Please cancel prior rx on file for this med - want to align with other med refills.  . montelukast (SINGULAIR) 10 MG tablet    Sig: Take 1 tablet (10 mg total) by mouth every morning.    Dispense:  90 tablet    Refill:  3  . levothyroxine (SYNTHROID) 75 MCG tablet    Sig: Take 1 tablet (75 mcg total) by mouth daily before breakfast. ON EMPTY STOMACH WITH A SMALL GLASS OF WATER, NO MEDS OR FOODS FOR 20 MIN    Dispense:  90 tablet    Refill:  3  . amphetamine-dextroamphetamine (ADDERALL) 10 MG tablet    Sig: Take 1 tablet (10 mg total) by mouth daily with breakfast.    Dispense:  60 tablet    Refill:  0  . amphetamine-dextroamphetamine (ADDERALL) 10 MG tablet    Sig: Take 1 tablet (10 mg total) by mouth 2 (two) times daily with a meal.     Dispense:  60 tablet    Refill:  0  . amphetamine-dextroamphetamine (ADDERALL) 10 MG tablet    Sig: Take 1 tablet (10 mg total) by mouth 2 (two) times daily with a meal.    Dispense:  30 tablet    Refill:  0  . telmisartan-hydrochlorothiazide (MICARDIS HCT) 80-25 MG tablet    Sig: Take 1 tablet by mouth daily.    Dispense:  90 tablet    Refill:  3  . traMADol (ULTRAM) 50 MG tablet    Sig: TAKE 1 TABLET 4 TIMES A DAY    Dispense:  120 tablet    Refill:  2    Do not fill more frequently than every 29 days  . predniSONE (DELTASONE) 20 MG tablet    Sig: 3 tabs po qam x 2d, 2 tabs po qam x 2d, 1 tab po qam x 2d    Dispense:  12 tablet    Refill:  0  . azithromycin (ZITHROMAX) 250 MG tablet    Sig: Take 2 tabs PO x 1 dose, then 1 tab PO QD x 4 days    Dispense:  6 tablet    Refill:  0  . benzonatate (TESSALON) 200 MG capsule    Sig: Take 1 capsule (200 mg total) by mouth 3 (three) times daily  as needed for cough.    Dispense:  40 capsule    Refill:  0  . ipratropium (ATROVENT) 0.03 % nasal spray    Sig: Place 2 sprays into the nose 4 (four) times daily as needed for rhinitis.    Dispense:  30 mL    Refill:  0    Patient verbalized to me that they understand the following: diagnosis, what is being done for them, what to expect and what should be done at home.  Their questions have been answered. They understand that I am unable to predict every possible medication interaction or adverse outcome and that if any unexpected symptoms arise, they should contact us and their pharmacist, as well as never hesitate to seek urgent/emergent care at [facility] or ER if they think it might be warranted.    Over 40 min spent in face-to-face evaluation of and consultation with patient and coordination of care.  Over 50% of this time was spent counseling this patient regarding above.   Norberto Sorenson, MD, MPH Primary Care at [facility] [address] [place], [place]   [address] [phone] Office phone  [phone] Office fax  09/26/18 10:27 AM

## 2018-09-27 LAB — TSH: TSH: 0.779 u[IU]/mL (ref 0.450–4.500)

## 2018-09-27 LAB — COMPREHENSIVE METABOLIC PANEL
ALT: 18 IU/L (ref 0–32)
AST: 22 IU/L (ref 0–40)
Albumin/Globulin Ratio: 1.7 (ref 1.2–2.2)
Albumin: 4.3 g/dL (ref 3.5–5.5)
Alkaline Phosphatase: 93 IU/L (ref 39–117)
BUN/Creatinine Ratio: 23 (ref 9–23)
BUN: 16 mg/dL (ref 6–24)
Bilirubin Total: 0.4 mg/dL (ref 0.0–1.2)
CALCIUM: 9.3 mg/dL (ref 8.7–10.2)
CO2: 25 mmol/L (ref 20–29)
Chloride: 101 mmol/L (ref 96–106)
Creatinine, Ser: 0.71 mg/dL (ref 0.57–1.00)
GFR calc Af Amer: 112 mL/min/{1.73_m2} (ref >59)
GFR, EST NON AFRICAN AMERICAN: 97 mL/min/{1.73_m2} (ref >59)
GLUCOSE: 93 mg/dL (ref 65–99)
Globulin, Total: 2.6 g/dL (ref 1.5–4.5)
Potassium: 4 mmol/L (ref 3.5–5.2)
Sodium: 142 mmol/L (ref 134–144)
Total Protein: 6.9 g/dL (ref 6.0–8.5)

## 2018-09-27 LAB — HEMOGLOBIN A1C
Est. average glucose Bld gHb Est-mCnc: 120 mg/dL
Hgb A1c MFr Bld: 5.8 % — ABNORMAL HIGH (ref 4.8–5.6)

## 2018-09-27 LAB — LIPASE: Lipase: 13 U/L — ABNORMAL LOW (ref 14–72)

## 2018-09-27 LAB — VITAMIN D 25 HYDROXY (VIT D DEFICIENCY, FRACTURES): Vit D, 25-Hydroxy: 21.3 ng/mL — ABNORMAL LOW (ref 30.0–100.0)

## 2018-09-28 ENCOUNTER — Encounter: Payer: Self-pay | Admitting: Family Medicine

## 2018-09-29 ENCOUNTER — Other Ambulatory Visit: Payer: Self-pay | Admitting: Family Medicine

## 2018-09-29 DIAGNOSIS — M797 Fibromyalgia: Secondary | ICD-10-CM

## 2018-09-29 DIAGNOSIS — G894 Chronic pain syndrome: Secondary | ICD-10-CM

## 2018-09-29 NOTE — Telephone Encounter (Signed)
Requested medication (s) are due for refill today: Yes  Requested medication (s) are on the active medication list: No  Last refill:  04/11/18  Future visit scheduled: Yes  Notes to clinic:  Medication d/c on 09/26/18    Requested Prescriptions  Pending Prescriptions Disp Refills   SOMA 350 MG tablet [Pharmacy Med Name: SOMA 350 MG TABLET] 120 tablet 2    Sig: TAKE 1 TABLET (350 MG TOTAL) BY MOUTH 4 (FOUR) TIMES DAILY AS NEEDED FOR MUSCLE SPASMS.     Not Delegated - Analgesics:  Muscle Relaxants Failed - 09/29/2018 12:35 PM      Failed - This refill cannot be delegated      Passed - Valid encounter within last 6 months    Recent Outpatient Visits          3 days ago Idiopathic chronic pancreatitis [facility](HCC)   [facility], Levell JulyEva N, MD   5 months ago Fibromyalgia   [facility], Levell JulyEva N, MD   10 months ago Recurrent pancreatitis [facility](HCC)   [facility], Levell JulyEva N, MD   1 year ago Idiopathic acute pancreatitis without infection or necrosis   [facility], Levell JulyEva N, MD   1 year ago Idiopathic acute pancreatitis without infection or necrosis   [facility], Levell JulyEva N, MD      Future Appointments            In 3 months Sherren MochaShaw, Eva N, MD [facility] [place], [facility]

## 2018-10-11 ENCOUNTER — Encounter: Payer: Self-pay | Admitting: Family Medicine

## 2018-10-27 ENCOUNTER — Ambulatory Visit: Payer: 59 | Admitting: Allergy

## 2018-11-15 ENCOUNTER — Other Ambulatory Visit: Payer: Self-pay | Admitting: Family Medicine

## 2018-11-15 DIAGNOSIS — G43809 Other migraine, not intractable, without status migrainosus: Secondary | ICD-10-CM

## 2018-12-01 ENCOUNTER — Ambulatory Visit: Payer: 59 | Admitting: Allergy

## 2018-12-05 ENCOUNTER — Other Ambulatory Visit: Payer: Self-pay | Admitting: Family Medicine

## 2018-12-05 NOTE — Telephone Encounter (Signed)
Requested medication (s) are due for refill today: yes   Requested medication (s) are on the active medication list: yes   Last refill: 11/05/2018   Future visit scheduled: yes  Notes to clinic:  Not on pt med list    Requested Prescriptions  Pending Prescriptions Disp Refills   albuterol (PROVENTIL HFA;VENTOLIN HFA) 108 (90 Base) MCG/ACT inhaler [Pharmacy Med Name: ALBUTEROL HFA (PROAIR) INHALER] 8.5 Inhaler 11    Sig: Inhale 2 puffs into the lungs every 4 (four) hours as needed for wheezing or shortness of breath.     Pulmonology:  Beta Agonists Failed - 12/05/2018 12:58 AM      Failed - One inhaler should last at least one month. If the patient is requesting refills earlier, contact the patient to check for uncontrolled symptoms.      Passed - Valid encounter within last 12 months    Recent Outpatient Visits          2 months ago Idiopathic chronic pancreatitis [facility])   [facility] at [facility], Levell July, MD   7 months ago Fibromyalgia   [facility] at [facility], Levell July, MD   1 year ago Recurrent pancreatitis [facility])   [facility] Care at [facility], Levell July, MD   1 year ago Idiopathic acute pancreatitis without infection or necrosis   [facility] at [facility], Levell July, MD   1 year ago Idiopathic acute pancreatitis without infection or necrosis   [facility] at [facility], Levell July, MD      Future Appointments            In 3 weeks Sherren Mocha, MD Primary Care at [place], [facility]

## 2018-12-11 ENCOUNTER — Telehealth: Payer: Self-pay | Admitting: Family Medicine

## 2018-12-11 NOTE — Telephone Encounter (Signed)
mychart message sent to pt about appointment with Dr Shaw °

## 2018-12-23 ENCOUNTER — Other Ambulatory Visit: Payer: Self-pay | Admitting: Family Medicine

## 2018-12-25 NOTE — Telephone Encounter (Signed)
Patient called, unable to leave VM due to mailbox is full. Patient will need to establish with another provider at [facility] in order to receive refills or follow Dr. Clelia Croft.

## 2018-12-25 NOTE — Telephone Encounter (Signed)
Requested medication (s) are due for refill today: Yes  Requested medication (s) are on the active medication list: Yes  Last refill:  09/13/18  Future visit scheduled: No  Notes to clinic:  Unable to refill, needs establish care appointment     Requested Prescriptions  Pending Prescriptions Disp Refills   azelastine (OPTIVAR) 0.05 % ophthalmic solution [Pharmacy Med Name: AZELASTINE HCL 0.05% DROPS] 6 mL 3    Sig: PLACE TWO DROPS INTO BOTH EYES TWICE DAILY FOR DRY EYES     Ophthalmology:  Antiallergy Passed - 12/25/2018 11:34 AM      Passed - Valid encounter within last 12 months    Recent Outpatient Visits          3 months ago Idiopathic chronic pancreatitis [facility])   [facility] Care at [facility], Levell July, MD   8 months ago Fibromyalgia   [facility] Care at [facility], Levell July, MD   1 year ago Recurrent pancreatitis [facility])   [facility] Care at [facility], Levell July, MD   1 year ago Idiopathic acute pancreatitis without infection or necrosis   [facility] Care at [facility], Levell July, MD   1 year ago Idiopathic acute pancreatitis without infection or necrosis   [facility] Care at [facility], Levell July, MD

## 2018-12-28 ENCOUNTER — Other Ambulatory Visit: Payer: Self-pay | Admitting: Family Medicine

## 2018-12-28 ENCOUNTER — Telehealth: Payer: Self-pay | Admitting: Family Medicine

## 2018-12-28 NOTE — Telephone Encounter (Signed)
Called patient in regards to rescheduling her appt with Dr. Creta Levin on 12/29/2018. The provider is out of the office and I rescheduled her for 01/02/2019 at 3:40.

## 2018-12-28 NOTE — Telephone Encounter (Signed)
Pt is requesting medication 

## 2018-12-28 NOTE — Telephone Encounter (Signed)
Requested medication (s) are due for refill today: yes  Requested medication (s) are on the active medication list: yes  Last refill:  04/11/18 for 90 and 1 refill  Future visit scheduled: yes  Notes to clinic:  Historical provider  Requested Prescriptions  Pending Prescriptions Disp Refills   citalopram (CELEXA) 20 MG tablet [Pharmacy Med Name: CITALOPRAM HBR 20 MG TABLET] 30 tablet 5    Sig: TAKE 1 TABLET BY MOUTH EVERY DAY     Psychiatry:  Antidepressants - SSRI Passed - 12/28/2018  2:28 AM      Passed - Valid encounter within last 6 months    Recent Outpatient Visits          3 months ago Idiopathic chronic pancreatitis [facility])   [facility] Care at [facility], Levell July, MD   8 months ago Fibromyalgia   [facility] Care at [facility], Levell July, MD   1 year ago Recurrent pancreatitis [facility])   [facility] Care at [facility], Levell July, MD   1 year ago Idiopathic acute pancreatitis without infection or necrosis   [facility] at [facility], Levell July, MD   1 year ago Idiopathic acute pancreatitis without infection or necrosis   Primary Care at [facility], Levell July, MD      Future Appointments            Tomorrow Doristine Bosworth, MD Primary Care at [place], [facility]

## 2018-12-29 ENCOUNTER — Ambulatory Visit: Payer: 59 | Admitting: Family Medicine

## 2018-12-30 ENCOUNTER — Other Ambulatory Visit: Payer: Self-pay | Admitting: *Deleted

## 2018-12-30 DIAGNOSIS — I10 Essential (primary) hypertension: Secondary | ICD-10-CM

## 2018-12-30 DIAGNOSIS — E559 Vitamin D deficiency, unspecified: Secondary | ICD-10-CM

## 2018-12-30 DIAGNOSIS — E039 Hypothyroidism, unspecified: Secondary | ICD-10-CM

## 2019-01-02 ENCOUNTER — Telehealth: Payer: 59 | Admitting: Family Medicine

## 2019-01-05 ENCOUNTER — Telehealth (INDEPENDENT_AMBULATORY_CARE_PROVIDER_SITE_OTHER): Payer: 59 | Admitting: Family Medicine

## 2019-01-05 ENCOUNTER — Ambulatory Visit (INDEPENDENT_AMBULATORY_CARE_PROVIDER_SITE_OTHER): Payer: 59 | Admitting: Family Medicine

## 2019-01-05 ENCOUNTER — Ambulatory Visit: Payer: 59 | Admitting: Allergy

## 2019-01-05 ENCOUNTER — Other Ambulatory Visit: Payer: Self-pay

## 2019-01-05 DIAGNOSIS — I10 Essential (primary) hypertension: Secondary | ICD-10-CM

## 2019-01-05 DIAGNOSIS — J301 Allergic rhinitis due to pollen: Secondary | ICD-10-CM

## 2019-01-05 DIAGNOSIS — J4541 Moderate persistent asthma with (acute) exacerbation: Secondary | ICD-10-CM

## 2019-01-05 DIAGNOSIS — E559 Vitamin D deficiency, unspecified: Secondary | ICD-10-CM | POA: Diagnosis not present

## 2019-01-05 DIAGNOSIS — E039 Hypothyroidism, unspecified: Secondary | ICD-10-CM

## 2019-01-05 DIAGNOSIS — M797 Fibromyalgia: Secondary | ICD-10-CM

## 2019-01-05 DIAGNOSIS — J0100 Acute maxillary sinusitis, unspecified: Secondary | ICD-10-CM

## 2019-01-05 DIAGNOSIS — F902 Attention-deficit hyperactivity disorder, combined type: Secondary | ICD-10-CM

## 2019-01-05 MED ORDER — AMPHETAMINE-DEXTROAMPHETAMINE 10 MG PO TABS: 10.0000 mg | ORAL_TABLET | Freq: Two times a day (BID) | ORAL | 0 refills | Status: DC

## 2019-01-05 MED ORDER — IPRATROPIUM BROMIDE 0.03 % NA SOLN: 2.0000 | Freq: Four times a day (QID) | NASAL | 0 refills | Status: DC | PRN

## 2019-01-05 MED ORDER — BENZONATATE 200 MG PO CAPS: 200.0000 mg | ORAL_CAPSULE | Freq: Three times a day (TID) | ORAL | 0 refills | Status: DC | PRN

## 2019-01-05 MED ORDER — ALBUTEROL SULFATE HFA 108 (90 BASE) MCG/ACT IN AERS: 2.0000 | INHALATION_SPRAY | Freq: Four times a day (QID) | RESPIRATORY_TRACT | 1 refills | Status: DC | PRN

## 2019-01-05 MED ORDER — AMOXICILLIN-POT CLAVULANATE 875-125 MG PO TABS: 1.0000 | ORAL_TABLET | Freq: Two times a day (BID) | ORAL | 0 refills | Status: DC

## 2019-01-05 NOTE — Progress Notes (Signed)
CC: med refills.  Pt c/o ? Sinus infection x 2 weeks, yellow-green mucus drainage that is thick and sticky and lumpy and is mixed with blood.  Per pt she is coughing a lot and requesting refill of tessalon perles for the cough.  Pt denies fevers.  Per pt she'll need refill on adderall 10 mg bid but scripts have been sent for #30 then #60 and then #30 again and she needs for #60 as she takes it bid.  No travel outside the country or [place] in past 3 weeks, one fall but no injury and no depression.

## 2019-01-05 NOTE — Progress Notes (Signed)
Telemedicine Encounter- SOAP NOTE Established Patient  This telephone encounter was conducted with the patient's (or proxy's) verbal consent via audio telecommunications: yes/no: Yes Patient was instructed to have this encounter in a suitably private space; and to only have persons present to whom they give permission to participate. In addition, patient identity was confirmed by use of name plus two identifiers (DOB and address).  I discussed the limitations, risks, security and privacy concerns of performing an evaluation and management service by telephone and the availability of in person appointments. I also discussed with the patient that there may be a patient responsible charge related to this service. The patient expressed understanding and agreed to proceed.  I spent a total of TIME; 0 MIN TO 60 MIN: 20 minutes talking with the patient or their proxy.  CC: sinus issues, ADD  Subjective   Gloria Carter is a 53 y.o. established patient. Telephone visit today for  HPI  Sinusitis,Allergy and Asthma 2 weeks of sinus congestion, mucus production, lumpy mucus, nasal discharge mixed with blood She is coughing she is using her albuterol daily  She has a history of allergy and asthma She states that her allergies are causing her to cough and wheeze   Thyroid She reports that she is compliant with her thyroid medication but needs refills She states that she has been having fatigue but thinks it is from sinusitis. Lab Results  Component Value Date   TSH 0.779 09/26/2018    ADD Pt has a diagnosis of ADD/ADHD Pt is taking adderall 10mg  at breakfast and lunch daily and has good results Pt does not use other stimulants such as energy drinks or caffeine Pt is able to sleep at night and denies unintentional weight loss Pt  denies mood swings Pt skips doses when not working or on weekends  Chronic Pain She has history of chronic pain and takes soma and lyrica She is a  patient of Dr. Norberto Sorenson who was managing her pain She works for [employer]  She denies any worsening body pain   Patient Active Problem List   Diagnosis Date Noted  . Metabolic syndrome 05/18/2017  . Acute pancreatitis without infection or necrosis 04/26/2017  . Vitamin D deficiency 06/09/2015  . Degeneration of lumbar or lumbosacral intervertebral disc 05/22/2015  . Right lumbar radiculopathy 05/22/2015  . Prediabetes 09/08/2013  . Hypothyroidism 08/31/2013  . Asthma, chronic 08/31/2013  . Upper airway cough syndrome 08/31/2013  . Carpal tunnel syndrome 06/01/2013  . Wrist tendonitis 06/01/2013  . Encounter for medication monitoring 12/01/2012  . HTN (hypertension) 08/13/2012  . BMI 45.0-49.9, adult (HCC) 08/13/2012  . Hyperlipidemia 08/13/2012  . IBS (irritable bowel syndrome) 08/13/2012  . ADD (attention deficit disorder) 02/07/2012  . Migraine 02/07/2012  . Dysphagia 02/07/2012  . Hernia of abdominal wall 02/07/2012  . RAD (reactive airway disease) 12/20/2011  . Fibromyalgia 12/20/2011  . Chronic pain 12/20/2011    Past Medical History:  Diagnosis Date  . ADD (attention deficit disorder)   . Allergy    ENVIRONMENTAL  . Arthritis    knees  . Asthma   . Depression   . Difficulty sleeping   . Dysphagia   . Fibromyalgia   . GERD (gastroesophageal reflux disease)   . Hernia of abdominal wall   . Hyperlipidemia   . Hypertension   . IBS (irritable bowel syndrome)   . Migraine   . Numbness and tingling in hands   . Obesity   . Swelling  of both ankles   . Thyroid disease     Current Outpatient Medications  Medication Sig Dispense Refill  . azelastine (OPTIVAR) 0.05 % ophthalmic solution PLACE TWO DROPS INTO BOTH EYES TWICE DAILY FOR DRY EYES 6 mL 3  . b complex vitamins tablet Take 1 tablet by mouth daily.    . Biotin 1000 MCG CHEW Chew 5,000 mcg by mouth.    . budesonide-formoterol (SYMBICORT) 160-4.5 MCG/ACT inhaler Inhale 2 puffs into the lungs 2  (two) times daily. 1 Inhaler 12  . Calcium-Magnesium-Zinc 167-83-8 MG TABS Take by mouth.    . cetirizine (ZYRTEC) 10 MG tablet Take 10 mg by mouth daily as needed for allergies.     . cholecalciferol (VITAMIN D) 400 UNITS TABS Take 400 Units by mouth daily.    . citalopram (CELEXA) 20 MG tablet TAKE 1 TABLET BY MOUTH EVERY DAY 30 tablet 5  . CREON 36000 units CPEP capsule TAKE 1 CAPSULES BY MOUTH AS DIRECTED 4 CAPS WITH A MEAL AND 1 CAP WITH SNACKS 390 capsule 11  . esomeprazole (NEXIUM) 40 MG capsule TAKE 1 CAPSULE BY MOUTH EVERY DAY 90 capsule 0  . ipratropium (ATROVENT) 0.03 % nasal spray Place 2 sprays into the nose 4 (four) times daily as needed for rhinitis. 30 mL 0  . levothyroxine (SYNTHROID) 75 MCG tablet Take 1 tablet (75 mcg total) by mouth daily before breakfast. ON EMPTY STOMACH WITH A SMALL GLASS OF WATER, NO MEDS OR FOODS FOR 20 MIN 90 tablet 3  . montelukast (SINGULAIR) 10 MG tablet Take 1 tablet (10 mg total) by mouth every morning. 90 tablet 3  . Multiple Vitamin (MULTIVITAMIN) tablet Take 1 tablet by mouth daily.    . predniSONE (DELTASONE) 20 MG tablet 3 tabs po qam x 2d, 2 tabs po qam x 2d, 1 tab po qam x 2d 12 tablet 0  . pregabalin (LYRICA) 150 MG capsule TAKE 1 CAPSULE (150 MG TOTAL) BY MOUTH 3 (THREE) TIMES DAILY. 90 capsule 2  . SOMA 350 MG tablet TAKE 1 TABLET (350 MG TOTAL) BY MOUTH 4 (FOUR) TIMES DAILY AS NEEDED FOR MUSCLE SPASMS. 120 tablet 2  . SUMAtriptan (IMITREX) 100 MG tablet TAKE 1 TAB BY MOUTH EVERY 2 HOURS AS NEEDED FOR MIGRAINE. NO MORE THAN 2 DOSES IN 24 HOURS. 8 tablet 3  . telmisartan-hydrochlorothiazide (MICARDIS HCT) 80-25 MG tablet Take 1 tablet by mouth daily. 90 tablet 3  . traMADol (ULTRAM) 50 MG tablet TAKE 1 TABLET 4 TIMES A DAY 120 tablet 2  . vitamin C (ASCORBIC ACID) 500 MG tablet Take 1,000 mg by mouth daily.    . vitamin E 400 UNIT capsule Take 400 Units by mouth daily.    Marland Kitchen albuterol (PROVENTIL HFA;VENTOLIN HFA) 108 (90 Base) MCG/ACT inhaler  Inhale 2 puffs into the lungs every 6 (six) hours as needed for wheezing or shortness of breath. 1 Inhaler 1  . amoxicillin-clavulanate (AUGMENTIN) 875-125 MG tablet Take 1 tablet by mouth 2 (two) times daily. 20 tablet 0  . amphetamine-dextroamphetamine (ADDERALL) 10 MG tablet Take 1 tablet (10 mg total) by mouth 2 (two) times daily with a meal. 60 tablet 0  . benzonatate (TESSALON) 200 MG capsule Take 1 capsule (200 mg total) by mouth 3 (three) times daily as needed for cough. 40 capsule 0   No current facility-administered medications for this visit.     Allergies  Allergen Reactions  . Soap     Any soaps with fragrance cause migraines  .  Sulfa Antibiotics Nausea And Vomiting    Social History   Socioeconomic History  . Marital status: Divorced    Spouse name: Not on file  . Number of children: 0  . Years of education: Not on file  . Highest education level: Not on file  Occupational History  . 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 Financial resource strain: Not on file  . Food insecurity:    Worry: Not on file    Inability: Not on file  . Transportation needs:    Medical: Not on file    Non-medical: Not on file  Tobacco Use  . Smoking status: Never Smoker  . Smokeless tobacco: Never Used  Substance and Sexual Activity  . Alcohol use: No    Alcohol/week: 0.0 standard drinks  . Drug use: No  . Sexual activity: Yes  Lifestyle  . Physical activity:    Days per week: Not on file    Minutes per session: Not on file  . Stress: Not on file  Relationships  . Social connections:    Talks on phone: Not on file    Gets together: Not on file    Attends religious service: Not on file    Active member of club or organization: Not on file    Attends meetings of clubs or organizations: Not on file    Relationship status: Not on file  . Intimate partner violence:    Fear of current or ex partner: Not on file    Emotionally abused: Not on  file    Physically abused: Not on file    Forced sexual activity: Not on file  Other Topics Concern  . Not on file  Social History Narrative   Married. Education: [employer].     ROS Review of Systems  Constitutional: Negative for activity change, appetite change, chills and fever.  HENT: see hpi Respiratory: see hpi Gastrointestinal: Negative for diarrhea, nausea and vomiting.  Genitourinary: Negative for difficulty urinating, dysuria, flank pain and hematuria.  Musculoskeletal: Negative for back pain, joint swelling and neck pain.  Neurological: Negative for dizziness, speech difficulty, light-headedness and numbness.  See HPI. All other review of systems negative.   Objective   Vitals as reported by the patient: There were no vitals filed for this visit.  Diagnoses and all orders for this visit:  Attention deficit hyperactivity disorder (ADHD), combined type- PMP aware reviewed Pt stable on current dose with mood swings, anorexia or unintentional weight loss No abberrant use noted  Reviewed common adverse reactions   -     amphetamine-dextroamphetamine (ADDERALL) 10 MG tablet; Take 1 tablet (10 mg total) by mouth 2 (two) times daily with a meal.  Acute non-recurrent maxillary sinusitis- sent in antibiotic  Reviewed allergies -     amoxicillin-clavulanate (AUGMENTIN) 875-125 MG tablet; Take 1 tablet by mouth 2 (two) times daily.  Acquired hypothyroidism- pt to return for labs  Seasonal allergic rhinitis due to pollen- continue otc allergy, atrovent sent in  Fibromyalgia- will refill after reviewing patient labs and vitals  Moderate persistent asthma with acute exacerbation-  Discussed allergy induced asthma  Discussed continuing current meds  Other orders -     benzonatate (TESSALON) 200 MG capsule; Take 1 capsule (200 mg total) by mouth 3 (three) times daily as needed for cough. -     ipratropium (ATROVENT) 0.03 % nasal spray; Place 2 sprays into the nose 4 (four)  times daily as needed for  rhinitis. -     albuterol (PROVENTIL HFA;VENTOLIN HFA) 108 (90 Base) MCG/ACT inhaler; Inhale 2 puffs into the lungs every 6 (six) hours as needed for wheezing or shortness of breath.     I discussed the assessment and treatment plan with the patient. The patient was provided an opportunity to ask questions and all were answered. The patient agreed with the plan and demonstrated an understanding of the instructions.   The patient was advised to call back or seek an in-person evaluation if the symptoms worsen or if the condition fails to improve as anticipated.  I provided 20 minutes of non-face-to-face time during this encounter.  Doristine Bosworth, MD  Primary Care at [facility]

## 2019-01-05 NOTE — Patient Instructions (Signed)
° ° ° °  If you have lab work done today you will be contacted with your lab results within the next 2 weeks.  If you have not heard from us then please contact us. The fastest way to get your results is to register for My Chart. ° ° °IF you received an x-ray today, you will receive an invoice from [facility]. Please contact [facility] at [phone] with questions or concerns regarding your invoice.  ° °IF you received labwork today, you will receive an invoice from [facility]. Please contact [facility] at [phone] with questions or concerns regarding your invoice.  ° °Our billing staff will not be able to assist you with questions regarding bills from these companies. ° °You will be contacted with the lab results as soon as they are available. The fastest way to get your results is to activate your My Chart account. Instructions are located on the last page of this paperwork. If you have not heard from us regarding the results in 2 weeks, please contact this office. °  ° ° ° °

## 2019-01-06 ENCOUNTER — Telehealth: Payer: 59 | Admitting: Family Medicine

## 2019-01-06 LAB — COMPREHENSIVE METABOLIC PANEL
ALT: 20 IU/L (ref 0–32)
AST: 20 IU/L (ref 0–40)
Albumin/Globulin Ratio: 1.6 (ref 1.2–2.2)
Albumin: 4.1 g/dL (ref 3.8–4.9)
Alkaline Phosphatase: 97 IU/L (ref 39–117)
BUN/Creatinine Ratio: 16 (ref 9–23)
BUN: 12 mg/dL (ref 6–24)
Bilirubin Total: 0.3 mg/dL (ref 0.0–1.2)
CO2: 25 mmol/L (ref 20–29)
CREATININE: 0.74 mg/dL (ref 0.57–1.00)
Calcium: 9.3 mg/dL (ref 8.7–10.2)
Chloride: 100 mmol/L (ref 96–106)
GFR calc Af Amer: 105 mL/min/{1.73_m2} (ref >59)
GFR, EST NON AFRICAN AMERICAN: 91 mL/min/{1.73_m2} (ref >59)
GLUCOSE: 104 mg/dL — AB (ref 65–99)
Globulin, Total: 2.6 g/dL (ref 1.5–4.5)
Potassium: 4 mmol/L (ref 3.5–5.2)
Sodium: 142 mmol/L (ref 134–144)
Total Protein: 6.7 g/dL (ref 6.0–8.5)

## 2019-01-06 LAB — LIPID PANEL
CHOL/HDL RATIO: 5.4 ratio — AB (ref 0.0–4.4)
Cholesterol, Total: 221 mg/dL — ABNORMAL HIGH (ref 100–199)
HDL: 41 mg/dL (ref >39)
LDL Calculated: 137 mg/dL — ABNORMAL HIGH (ref 0–99)
Triglycerides: 215 mg/dL — ABNORMAL HIGH (ref 0–149)
VLDL Cholesterol Cal: 43 mg/dL — ABNORMAL HIGH (ref 5–40)

## 2019-01-06 LAB — TSH: TSH: 0.671 u[IU]/mL (ref 0.450–4.500)

## 2019-01-06 LAB — VITAMIN D 25 HYDROXY (VIT D DEFICIENCY, FRACTURES): Vit D, 25-Hydroxy: 18.5 ng/mL — ABNORMAL LOW (ref 30.0–100.0)

## 2019-01-12 ENCOUNTER — Telehealth: Payer: Self-pay | Admitting: Family Medicine

## 2019-01-12 NOTE — Telephone Encounter (Signed)
Saw you on 01/05/19 and was wondering if she could get a note to go back to work and has been out since.

## 2019-01-12 NOTE — Telephone Encounter (Signed)
Called pt and informed her that I will not be writing her a letter to go back to work for she is not ready to go back to work. She does not sound very well and so we agreed for her to stay home a rest some more.   Please advise on any additional recommended for she is not feeling better. She is still on the antibiotic and the tessalon perls are not working well.

## 2019-01-12 NOTE — Telephone Encounter (Signed)
Copied from CRM [id number]. Topic: General - Other >> Jan 12, 2019  3:38 PM Mcneil, Ja-Kwan wrote: Reason for CRM: Pt stated she had an appt on 01/05/19 and she has not been able to go back to work this week due to her not feeling well. Pt requests a doctor's note so she can return to back work. Cb# [phone]

## 2019-01-16 NOTE — Telephone Encounter (Signed)
Pt called again still not feeling well. Pt states it has dropped down into her chest and does not sound better. Pt is requesting more antibiotics or a different antibiotic so she can go back to work. Pt states she has been staying at home resting. Please advise.

## 2019-01-17 ENCOUNTER — Telehealth: Payer: Self-pay | Admitting: Family Medicine

## 2019-01-17 NOTE — Telephone Encounter (Signed)
Copied from CRM [id number]. Topic: General - Inquiry >> Jan 17, 2019  2:45 PM Dalphine Handing A wrote: Reason for CRM: Patient is now requesting a note for her employer stating that she does not need to go back to work.  Best contact number is [phone]

## 2019-01-19 NOTE — Telephone Encounter (Signed)
Please advise as I know you were treating her for such.

## 2019-01-19 NOTE — Telephone Encounter (Signed)
Pt needs another tele med visit if not feeling any better.

## 2019-01-22 ENCOUNTER — Telehealth: Payer: Self-pay | Admitting: Family Medicine

## 2019-01-22 NOTE — Telephone Encounter (Signed)
No, I need to speak with someone immediately regarding my clearance to return to work, or the reason I can't return to work. It doesn't matter who I speak with, but it needs to be now, because I've had to apply for FMLA in order to keep my job after being out for more than 2 weeks. I have been trying for over a week to speak with any doctor or PA regarding this! Failing anything else, please provide me with a letter to provide my employer as to why I am unable to obtain return to work status. Thank you for your help.  ----- Message -----  From: Gloria Carter  Sent: 01/22/2019 10:21 AM EDT  To: Gloria Carter  Subject: RE: Appointment Request  Good Morning,     The next available appointment Dr. Creta Levin has is 01/30/19 @ 9:40 as a TELEMED visit .Marland Kitchen   Is this OK with you?    Thank you       ----- Message -----   From: Gloria Carter   Sent: 01/21/2019 10:33 PM EDT    To: Patient Appointment Schedule Request Message List  Subject: RE: Appointment Request    Appointment Request From: Gloria Carter    With Provider: Doristine Bosworth, MD [[facility] at [facility]]    Preferred Date Range: Any    Preferred Times: Any time    Reason for visit: Request an Appointment    Comments:  Need to be cleared to return to work.

## 2019-01-23 ENCOUNTER — Telehealth (INDEPENDENT_AMBULATORY_CARE_PROVIDER_SITE_OTHER): Payer: 59 | Admitting: Family Medicine

## 2019-01-23 ENCOUNTER — Ambulatory Visit (INDEPENDENT_AMBULATORY_CARE_PROVIDER_SITE_OTHER): Payer: 59

## 2019-01-23 ENCOUNTER — Other Ambulatory Visit: Payer: Self-pay

## 2019-01-23 DIAGNOSIS — R05 Cough: Secondary | ICD-10-CM

## 2019-01-23 DIAGNOSIS — R059 Cough, unspecified: Secondary | ICD-10-CM

## 2019-01-23 DIAGNOSIS — J069 Acute upper respiratory infection, unspecified: Secondary | ICD-10-CM

## 2019-01-23 DIAGNOSIS — Z7189 Other specified counseling: Secondary | ICD-10-CM

## 2019-01-23 LAB — POCT CBC
Granulocyte percent: 5 %G — AB (ref 37–80)
HCT, POC: 37.5 % (ref 29–41)
Hemoglobin: 12.7 g/dL (ref 11–14.6)
Lymph, poc: 33.8 — AB (ref 0.6–3.4)
MCH, POC: 30.5 pg (ref 27–31.2)
MCHC: 33.7 g/dL (ref 31.8–35.4)
MCV: 90.5 fL (ref 76–111)
MID (cbc): 59.4 — AB (ref 0–0.9)
MPV: 8.4 fL (ref 0–99.8)
POC Granulocyte: 0.6 — AB (ref 2–6.9)
POC LYMPH PERCENT: 6.8 %L — AB (ref 10–50)
POC MID %: 2.8 %M (ref 0–12)
Platelet Count, POC: 213 10*3/uL (ref 142–424)
RBC: 4.15 M/uL (ref 4.04–5.48)
RDW, POC: 12.6 %
WBC: 8.4 10*3/uL (ref 4.6–10.2)

## 2019-01-23 LAB — POCT SEDIMENTATION RATE: POCT SED RATE: 46 mm/hr — AB (ref 0–22)

## 2019-01-23 MED ORDER — HYDROCODONE-HOMATROPINE 5-1.5 MG/5ML PO SYRP: 5.0000 mL | ORAL_SOLUTION | Freq: Three times a day (TID) | ORAL | 0 refills | Status: DC | PRN

## 2019-01-23 NOTE — Patient Instructions (Signed)
Take hydrocodone cough syrup at nights only Continue mucinex in the day time   Your chest xray was clear See the results below  FINDINGS: Mild atelectasis in the bases. The heart, hila, mediastinum, lungs, and pleura are normal.  IMPRESSION: No active cardiopulmonary disease.   Electronically Signed   By: Gerome Sam III M.D   On: 01/23/2019 13:16

## 2019-01-23 NOTE — Progress Notes (Signed)
Telemedicine Encounter- SOAP NOTE Established Patient  This telephone encounter was conducted with the patient's (or proxy's) verbal consent via audio telecommunications: yes/no: Yes Patient was instructed to have this encounter in a suitably private space; and to only have persons present to whom they give permission to participate. In addition, patient identity was confirmed by use of name plus two identifiers (DOB and address).  I discussed the limitations, risks, security and privacy concerns of performing an evaluation and management service by telephone and the availability of in person appointments. I also discussed with the patient that there may be a patient responsible charge related to this service. The patient expressed understanding and agreed to proceed.  I spent a total of TIME; 0 MIN TO 60 MIN: 25 minutes talking with the patient or their proxy.  CC: cough and congestion  Subjective   Gloria MccreedyBarbara Carter is a 53 y.o. established patient. Telephone visit today for  HPI Cough, congestion  She reports that since 01/04/19 she has been having mucus draining  She has postnasal drip with some nausea She took amoxicillin and completed the course then switched to mucinex She takes her thyroid meds and adderall She denies fevers She also reports fatigue  She states that her worse symptom has been the cough She works at the department of social services.  [facility] 01/23/2019 09/26/2018 04/11/2018 11/19/2017 11/19/2017  Decreased Interest 0 0 0 0 0  Down, Depressed, Hopeless 0 0 0 0 0  PHQ - 2 Score 0 0 0 0 0    Patient Active Problem List   Diagnosis Date Noted   Metabolic syndrome 05/18/2017   Acute pancreatitis without infection or necrosis 04/26/2017   Vitamin D deficiency 06/09/2015   Degeneration of lumbar or lumbosacral intervertebral disc 05/22/2015   Right lumbar radiculopathy 05/22/2015   Prediabetes 09/08/2013   Hypothyroidism 08/31/2013     Asthma, chronic 08/31/2013   Upper airway cough syndrome 08/31/2013   Carpal tunnel syndrome 06/01/2013   Wrist tendonitis 06/01/2013   Encounter for medication monitoring 12/01/2012   HTN (hypertension) 08/13/2012   BMI 45.0-49.9, adult (HCC) 08/13/2012   Hyperlipidemia 08/13/2012   IBS (irritable bowel syndrome) 08/13/2012   ADD (attention deficit disorder) 02/07/2012   Migraine 02/07/2012   Dysphagia 02/07/2012   Hernia of abdominal wall 02/07/2012   RAD (reactive airway disease) 12/20/2011   Fibromyalgia 12/20/2011   Chronic pain 12/20/2011    Past Medical History:  Diagnosis Date   ADD (attention deficit disorder)    Allergy    ENVIRONMENTAL   Arthritis    knees   Asthma    Depression    Difficulty sleeping    Dysphagia    Fibromyalgia    GERD (gastroesophageal reflux disease)    Hernia of abdominal wall    Hyperlipidemia    Hypertension    IBS (irritable bowel syndrome)    Migraine    Numbness and tingling in hands    Obesity    Swelling of both ankles    Thyroid disease     Current Outpatient Medications  Medication Sig Dispense Refill   albuterol (PROVENTIL HFA;VENTOLIN HFA) 108 (90 Base) MCG/ACT inhaler Inhale 2 puffs into the lungs every 6 (six) hours as needed for wheezing or shortness of breath. 1 Inhaler 1   amoxicillin-clavulanate (AUGMENTIN) 875-125 MG tablet Take 1 tablet by mouth 2 (two) times daily. 20 tablet 0   amphetamine-dextroamphetamine (ADDERALL) 10 MG tablet Take 1 tablet (10 mg total) by  mouth 2 (two) times daily with a meal. 60 tablet 0   azelastine (OPTIVAR) 0.05 % ophthalmic solution PLACE TWO DROPS INTO BOTH EYES TWICE DAILY FOR DRY EYES 6 mL 3   b complex vitamins tablet Take 1 tablet by mouth daily.     benzonatate (TESSALON) 200 MG capsule Take 1 capsule (200 mg total) by mouth 3 (three) times daily as needed for cough. 40 capsule 0   Biotin 1000 MCG CHEW Chew 5,000 mcg by mouth.      budesonide-formoterol (SYMBICORT) 160-4.5 MCG/ACT inhaler Inhale 2 puffs into the lungs 2 (two) times daily. 1 Inhaler 12   Calcium-Magnesium-Zinc 167-83-8 MG TABS Take by mouth.     cetirizine (ZYRTEC) 10 MG tablet Take 10 mg by mouth daily as needed for allergies.      cholecalciferol (VITAMIN D) 400 UNITS TABS Take 400 Units by mouth daily.     citalopram (CELEXA) 20 MG tablet TAKE 1 TABLET BY MOUTH EVERY DAY 30 tablet 5   CREON 36000 units CPEP capsule TAKE 1 CAPSULES BY MOUTH AS DIRECTED 4 CAPS WITH A MEAL AND 1 CAP WITH SNACKS 390 capsule 11   esomeprazole (NEXIUM) 40 MG capsule TAKE 1 CAPSULE BY MOUTH EVERY DAY 90 capsule 0   HYDROcodone-homatropine (HYCODAN) 5-1.5 MG/5ML syrup Take 5 mLs by mouth every 8 (eight) hours as needed for cough. 120 mL 0   ipratropium (ATROVENT) 0.03 % nasal spray Place 2 sprays into the nose 4 (four) times daily as needed for rhinitis. 30 mL 0   levothyroxine (SYNTHROID) 75 MCG tablet Take 1 tablet (75 mcg total) by mouth daily before breakfast. ON EMPTY STOMACH WITH A SMALL GLASS OF WATER, NO MEDS OR FOODS FOR 20 MIN 90 tablet 3   montelukast (SINGULAIR) 10 MG tablet Take 1 tablet (10 mg total) by mouth every morning. 90 tablet 3   Multiple Vitamin (MULTIVITAMIN) tablet Take 1 tablet by mouth daily.     predniSONE (DELTASONE) 20 MG tablet 3 tabs po qam x 2d, 2 tabs po qam x 2d, 1 tab po qam x 2d 12 tablet 0   pregabalin (LYRICA) 150 MG capsule TAKE 1 CAPSULE (150 MG TOTAL) BY MOUTH 3 (THREE) TIMES DAILY. 90 capsule 2   SOMA 350 MG tablet TAKE 1 TABLET (350 MG TOTAL) BY MOUTH 4 (FOUR) TIMES DAILY AS NEEDED FOR MUSCLE SPASMS. 120 tablet 2   SUMAtriptan (IMITREX) 100 MG tablet TAKE 1 TAB BY MOUTH EVERY 2 HOURS AS NEEDED FOR MIGRAINE. NO MORE THAN 2 DOSES IN 24 HOURS. 8 tablet 3   telmisartan-hydrochlorothiazide (MICARDIS HCT) 80-25 MG tablet Take 1 tablet by mouth daily. 90 tablet 3   traMADol (ULTRAM) 50 MG tablet TAKE 1 TABLET 4 TIMES A DAY 120  tablet 2   vitamin C (ASCORBIC ACID) 500 MG tablet Take 1,000 mg by mouth daily.     vitamin E 400 UNIT capsule Take 400 Units by mouth daily.     No current facility-administered medications for this visit.     Allergies  Allergen Reactions   Soap     Any soaps with fragrance cause migraines   Sulfa Antibiotics Nausea And Vomiting    Social History   Socioeconomic History   Marital status: Divorced    Spouse name: Not on file   Number of children: 0   Years of education: Not on file   Highest education level: Not on file  Occupational History   Occupation: Hospital doctor:  [employer]  Social Network engineer strain: Not on file   Food insecurity:    Worry: Not on file    Inability: Not on file   Transportation needs:    Medical: Not on file    Non-medical: Not on file  Tobacco Use   Smoking status: Never Smoker   Smokeless tobacco: Never Used  Substance and Sexual Activity   Alcohol use: No    Alcohol/week: 0.0 standard drinks   Drug use: No   Sexual activity: Yes  Lifestyle   Physical activity:    Days per week: Not on file    Minutes per session: Not on file   Stress: Not on file  Relationships   Social connections:    Talks on phone: Not on file    Gets together: Not on file    Attends religious service: Not on file    Active member of club or organization: Not on file    Attends meetings of clubs or organizations: Not on file    Relationship status: Not on file   Intimate partner violence:    Fear of current or ex partner: Not on file    Emotionally abused: Not on file    Physically abused: Not on file    Forced sexual activity: Not on file  Other Topics Concern   Not on file  Social History Narrative   Married. Education: [employer].     ROS Review of Systems  Constitutional: Negative for activity change, appetite change, no chills and fever.  HENT: see hpi  Respiratory: see hpi   Gastrointestinal:  Negative for diarrhea, nausea and vomiting.  Genitourinary: Negative for difficulty urinating, dysuria, flank pain and hematuria.  Musculoskeletal: Negative for back pain, joint swelling and neck pain.  Neurological: Negative for dizziness, speech difficulty, light-headedness and numbness.  See HPI. All other review of systems negative.   Objective   There were no vitals filed for this visit.  Patient able to speak in complete sentences    FINDINGS: Mild atelectasis in the bases. The heart, hila, mediastinum, lungs, and pleura are normal.  IMPRESSION: No active cardiopulmonary disease.   Electronically Signed   By: Gerome Sam III M.D   On: 01/23/2019 13:16   Diagnoses and all orders for this visit:  Cough- discussed that her sinusitis is improving Advised pt to continue hydration She was brought in for cxr and labs -     DG Chest 2 View; Future -     POCT CBC -     POCT SEDIMENTATION RATE  Educated About Covid-19 Virus Infection- discussed ways to prevent infection   Acute upper respiratory infection -  Based on cxr will continue current plans with hydrocodone cough syrup for sympathetic relief  Other orders -     HYDROcodone-homatropine (HYCODAN) 5-1.5 MG/5ML syrup; Take 5 mLs by mouth every 8 (eight) hours as needed for cough.   I discussed the assessment and treatment plan with the patient. The patient was provided an opportunity to ask questions and all were answered. The patient agreed with the plan and demonstrated an understanding of the instructions.   The patient was advised to call back or seek an in-person evaluation if the symptoms worsen or if the condition fails to improve as anticipated.    I provided 25  minutes of face-to-face time during this encounter.  Doristine Bosworth, MD  [facility]

## 2019-01-23 NOTE — Progress Notes (Signed)
Congestion and coughing, not sure if she's had a fever. Not sure if thermometer works. Just wants to know when she will be able to go to work or she will need a note stating that she cannot return

## 2019-01-30 ENCOUNTER — Telehealth: Payer: 59 | Admitting: Family Medicine

## 2019-02-09 ENCOUNTER — Encounter: Payer: Self-pay | Admitting: Family Medicine

## 2019-02-28 ENCOUNTER — Telehealth: Payer: Self-pay | Admitting: Family Medicine

## 2019-02-28 DIAGNOSIS — M5416 Radiculopathy, lumbar region: Secondary | ICD-10-CM

## 2019-02-28 DIAGNOSIS — G894 Chronic pain syndrome: Secondary | ICD-10-CM

## 2019-02-28 DIAGNOSIS — M797 Fibromyalgia: Secondary | ICD-10-CM

## 2019-02-28 NOTE — Telephone Encounter (Signed)
Copied from CRM [id number]. Topic: Quick Communication - Rx Refill/Question >> Feb 28, 2019  5:10 PM Jens Som A wrote: Medication: SOMA 350 MG tablet [[id number]] , traMADol (ULTRAM) 50 MG tablet [[id number]]   Has the patient contacted their pharmacy? Yes  (Agent: If no, request that the patient contact the pharmacy for the refill.) (Agent: If yes, when and what did the pharmacy advise?)  Preferred Pharmacy (with phone number or street name): [id number] [place], [place] - [address] [phone] (Phone) [phone] (Fax)    Agent: Please be advised that RX refills may take up to 3 business days. We ask that you follow-up with your pharmacy.

## 2019-03-01 ENCOUNTER — Encounter: Payer: Self-pay | Admitting: Family Medicine

## 2019-03-01 MED ORDER — CARISOPRODOL 350 MG PO TABS: ORAL_TABLET | ORAL | 2 refills | Status: DC

## 2019-03-01 MED ORDER — TRAMADOL HCL 50 MG PO TABS: ORAL_TABLET | ORAL | 2 refills | Status: DC

## 2019-03-01 NOTE — Telephone Encounter (Signed)
Dr Stallings advise 

## 2019-03-10 ENCOUNTER — Other Ambulatory Visit: Payer: Self-pay | Admitting: Family Medicine

## 2019-03-20 ENCOUNTER — Telehealth: Payer: Self-pay | Admitting: Family Medicine

## 2019-03-20 DIAGNOSIS — F902 Attention-deficit hyperactivity disorder, combined type: Secondary | ICD-10-CM

## 2019-03-20 NOTE — Telephone Encounter (Signed)
Copied from [place] [id number] [id number]. Topic: Quick Communication - Rx Refill/Question >> Mar 20, 2019  3:10 PM Pauline Good wrote: Medication: Adderrall 10mg   Has the patient contacted their pharmacy?No (Agent: If no, request that the patient contact the pharmacy for the refill.) (Agent: If yes, when and what did the pharmacy advise?)  Preferred Pharmacy (with phone number or street name): [facility]  Agent: Please be advised that RX refills may take up to 3 business days. We ask that you follow-up with your pharmacy.

## 2019-03-26 NOTE — Telephone Encounter (Signed)
Pt is requesting adderrall medicaton 10mg 

## 2019-03-27 NOTE — Telephone Encounter (Signed)
Patient being seen by you.

## 2019-03-28 MED ORDER — AMPHETAMINE-DEXTROAMPHETAMINE 10 MG PO TABS: 10.0000 mg | ORAL_TABLET | Freq: Two times a day (BID) | ORAL | 0 refills | Status: DC

## 2019-03-28 NOTE — Telephone Encounter (Signed)
Please notify the patient that her refill was sent in

## 2019-04-17 ENCOUNTER — Emergency Department (HOSPITAL_BASED_OUTPATIENT_CLINIC_OR_DEPARTMENT_OTHER)
Admission: EM | Admit: 2019-04-17 | Discharge: 2019-04-17 | Disposition: A | Discharge status: Home / Self Care | Payer: 59 | Attending: Emergency Medicine | Admitting: Emergency Medicine

## 2019-04-17 ENCOUNTER — Encounter (HOSPITAL_BASED_OUTPATIENT_CLINIC_OR_DEPARTMENT_OTHER): Payer: Self-pay

## 2019-04-17 ENCOUNTER — Emergency Department (HOSPITAL_BASED_OUTPATIENT_CLINIC_OR_DEPARTMENT_OTHER): Payer: 59

## 2019-04-17 ENCOUNTER — Other Ambulatory Visit: Payer: Self-pay

## 2019-04-17 DIAGNOSIS — I10 Essential (primary) hypertension: Secondary | ICD-10-CM | POA: Insufficient documentation

## 2019-04-17 DIAGNOSIS — K859 Acute pancreatitis without necrosis or infection, unspecified: Secondary | ICD-10-CM | POA: Insufficient documentation

## 2019-04-17 DIAGNOSIS — R1013 Epigastric pain: Secondary | ICD-10-CM | POA: Diagnosis present

## 2019-04-17 DIAGNOSIS — Z79899 Other long term (current) drug therapy: Secondary | ICD-10-CM | POA: Diagnosis not present

## 2019-04-17 DIAGNOSIS — J45909 Unspecified asthma, uncomplicated: Secondary | ICD-10-CM | POA: Diagnosis not present

## 2019-04-17 DIAGNOSIS — E039 Hypothyroidism, unspecified: Secondary | ICD-10-CM | POA: Diagnosis not present

## 2019-04-17 LAB — CBC WITH DIFFERENTIAL/PLATELET
Abs Immature Granulocytes: 0.08 10*3/uL — ABNORMAL HIGH (ref 0.00–0.07)
Basophils Absolute: 0.1 10*3/uL (ref 0.0–0.1)
Basophils Relative: 0 %
Eosinophils Absolute: 0.2 10*3/uL (ref 0.0–0.5)
Eosinophils Relative: 1 %
HCT: 41.1 % (ref 36.0–46.0)
Hemoglobin: 13.9 g/dL (ref 12.0–15.0)
Immature Granulocytes: 1 %
Lymphocytes Relative: 21 %
Lymphs Abs: 3.5 10*3/uL (ref 0.7–4.0)
MCH: 30 pg (ref 26.0–34.0)
MCHC: 33.8 g/dL (ref 30.0–36.0)
MCV: 88.6 fL (ref 80.0–100.0)
Monocytes Absolute: 1.3 10*3/uL — ABNORMAL HIGH (ref 0.1–1.0)
Monocytes Relative: 8 %
Neutro Abs: 11.2 10*3/uL — ABNORMAL HIGH (ref 1.7–7.7)
Neutrophils Relative %: 69 %
Platelets: 248 10*3/uL (ref 150–400)
RBC: 4.64 MIL/uL (ref 3.87–5.11)
RDW: 12 % (ref 11.5–15.5)
WBC: 16.3 10*3/uL — ABNORMAL HIGH (ref 4.0–10.5)
nRBC: 0 % (ref 0.0–0.2)

## 2019-04-17 LAB — COMPREHENSIVE METABOLIC PANEL
ALT: 23 U/L (ref 0–44)
AST: 23 U/L (ref 15–41)
Albumin: 4.4 g/dL (ref 3.5–5.0)
Alkaline Phosphatase: 97 U/L (ref 38–126)
Anion gap: 13 (ref 5–15)
BUN: 13 mg/dL (ref 6–20)
CO2: 27 mmol/L (ref 22–32)
Calcium: 9.2 mg/dL (ref 8.9–10.3)
Chloride: 88 mmol/L — ABNORMAL LOW (ref 98–111)
Creatinine, Ser: 0.8 mg/dL (ref 0.44–1.00)
GFR calc Af Amer: >60 mL/min (ref >60)
GFR calc non Af Amer: >60 mL/min (ref >60)
Glucose, Bld: 107 mg/dL — ABNORMAL HIGH (ref 70–99)
Potassium: 3.2 mmol/L — ABNORMAL LOW (ref 3.5–5.1)
Sodium: 128 mmol/L — ABNORMAL LOW (ref 135–145)
Total Bilirubin: 1.2 mg/dL (ref 0.3–1.2)
Total Protein: 8.1 g/dL (ref 6.5–8.1)

## 2019-04-17 LAB — URINALYSIS, ROUTINE W REFLEX MICROSCOPIC
Glucose, UA: NEGATIVE mg/dL
Ketones, ur: NEGATIVE mg/dL
Nitrite: NEGATIVE
Protein, ur: 30 mg/dL — AB
Specific Gravity, Urine: 1.025 (ref 1.005–1.030)
pH: 5.5 (ref 5.0–8.0)

## 2019-04-17 LAB — URINALYSIS, MICROSCOPIC (REFLEX)

## 2019-04-17 LAB — LIPASE, BLOOD: Lipase: 26 U/L (ref 11–51)

## 2019-04-17 MED ORDER — OXYCODONE HCL 5 MG PO TABS: 5.0000 mg | ORAL_TABLET | Freq: Four times a day (QID) | ORAL | 0 refills | Status: DC | PRN

## 2019-04-17 MED ORDER — ONDANSETRON HCL 4 MG/2ML IJ SOLN
4.0000 mg | Freq: Once | INTRAMUSCULAR | Status: AC
  Administered 2019-04-17: 17:00:00 4 mg via INTRAVENOUS
  Filled 2019-04-17: qty 2

## 2019-04-17 MED ORDER — FENTANYL CITRATE (PF) 100 MCG/2ML IJ SOLN
50.0000 ug | Freq: Once | INTRAMUSCULAR | Status: AC
  Administered 2019-04-17: 50 ug via INTRAVENOUS
  Filled 2019-04-17: qty 2

## 2019-04-17 MED ORDER — ONDANSETRON HCL 4 MG PO TABS: 4.0000 mg | ORAL_TABLET | Freq: Three times a day (TID) | ORAL | 0 refills | Status: DC | PRN

## 2019-04-17 MED ORDER — OXYCODONE HCL 5 MG PO TABS
5.0000 mg | ORAL_TABLET | Freq: Once | ORAL | Status: AC
  Administered 2019-04-17: 5 mg via ORAL
  Filled 2019-04-17: qty 1

## 2019-04-17 MED ORDER — SODIUM CHLORIDE 0.9 % IV BOLUS
1000.0000 mL | Freq: Once | INTRAVENOUS | Status: AC
  Administered 2019-04-17: 1000 mL via INTRAVENOUS

## 2019-04-17 MED ORDER — IOHEXOL 300 MG/ML  SOLN
100.0000 mL | Freq: Once | INTRAMUSCULAR | Status: AC | PRN
  Administered 2019-04-17: 100 mL via INTRAVENOUS

## 2019-04-17 MED ORDER — POTASSIUM CHLORIDE 20 MEQ/15ML (10%) PO SOLN
40.0000 meq | Freq: Once | ORAL | Status: AC
  Administered 2019-04-17: 40 meq via ORAL
  Filled 2019-04-17: qty 30

## 2019-04-17 NOTE — ED Triage Notes (Signed)
C/o abd pain x 4 days-denies v/d-NAD-steady gait with own rollator-states pain feels like pancreatitis and her PCP advised her to come to ED

## 2019-04-17 NOTE — ED Provider Notes (Signed)
[facility] EMERGENCY DEPARTMENT Provider Note   CSN: [id number] Arrival date & time: 04/17/19  1615    History   Chief Complaint Chief Complaint  Patient presents with  . Abdominal Pain    HPI Lucious GrovesBarbara Rapp-Austin is a 53 y.o. female.     The history is provided by the patient.  Abdominal Pain Pain location:  Epigastric Pain quality: aching and dull   Pain radiates to:  Does not radiate Pain severity:  Mild Onset quality:  Gradual Timing:  Constant Progression:  Unchanged Chronicity:  Recurrent Context comment:  Hx of panreatitis  Relieved by:  Nothing Worsened by:  Nothing Associated symptoms: no anorexia, no chest pain, no chills, no constipation, no cough, no dysuria, no fever, no flatus, no hematuria, no nausea, no shortness of breath, no sore throat and no vomiting   Risk factors: multiple surgeries     Past Medical History:  Diagnosis Date  . ADD (attention deficit disorder)   . Allergy    ENVIRONMENTAL  . Arthritis    knees  . Asthma   . Depression   . Difficulty sleeping   . Dysphagia   . Fibromyalgia   . GERD (gastroesophageal reflux disease)   . Hernia of abdominal wall   . Hyperlipidemia   . Hypertension   . IBS (irritable bowel syndrome)   . Migraine   . Numbness and tingling in hands   . Obesity   . Swelling of both ankles   . Thyroid disease     Patient Active Problem List   Diagnosis Date Noted  . Metabolic syndrome 05/18/2017  . Acute pancreatitis without infection or necrosis 04/26/2017  . Vitamin D deficiency 06/09/2015  . Degeneration of lumbar or lumbosacral intervertebral disc 05/22/2015  . Right lumbar radiculopathy 05/22/2015  . Prediabetes 09/08/2013  . Hypothyroidism 08/31/2013  . Asthma, chronic 08/31/2013  . Upper airway cough syndrome 08/31/2013  . Carpal tunnel syndrome 06/01/2013  . Wrist tendonitis 06/01/2013  . Encounter for medication monitoring 12/01/2012  . HTN (hypertension) 08/13/2012  . BMI  45.0-49.9, adult (HCC) 08/13/2012  . Hyperlipidemia 08/13/2012  . IBS (irritable bowel syndrome) 08/13/2012  . ADD (attention deficit disorder) 02/07/2012  . Migraine 02/07/2012  . Dysphagia 02/07/2012  . Hernia of abdominal wall 02/07/2012  . RAD (reactive airway disease) 12/20/2011  . Fibromyalgia 12/20/2011  . Chronic pain 12/20/2011    Past Surgical History:  Procedure Laterality Date  . INSERTION OF MESH N/A 03/28/2014   Procedure: INSERTION OF MESH;  Surgeon: Velora Hecklerodd M Gerkin, MD;  Location: [facility];  Service: General;  Laterality: N/A;  . TONSILECTOMY/ADENOIDECTOMY WITH MYRINGOTOMY    . VENTRAL HERNIA REPAIR N/A 03/28/2014   Procedure: LAPAROSCOPIC VENTRAL HERNIA REPAIR WITH MESH;  Surgeon: Velora Hecklerodd M Gerkin, MD;  Location: [facility];  Service: General;  Laterality: N/A;  . WISDOM TOOTH EXTRACTION       OB History   No obstetric history on file.      Home Medications    Prior to Admission medications   Medication Sig Start Date End Date Taking? Authorizing Provider  albuterol (VENTOLIN HFA) 108 (90 Base) MCG/ACT inhaler TAKE 2 PUFFS BY MOUTH EVERY 6 HOURS AS NEEDED FOR WHEEZE OR SHORTNESS OF BREATH 03/10/19   Collie SiadStallings, Zoe A, MD  amoxicillin-clavulanate (AUGMENTIN) 875-125 MG tablet Take 1 tablet by mouth 2 (two) times daily. 01/05/19   Doristine BosworthStallings, Zoe A, MD  amphetamine-dextroamphetamine (ADDERALL) 10 MG tablet Take 1 tablet (10 mg total) by mouth 2 (two)  times daily with a meal. 03/28/19   Stallings, Zoe A, MD  azelastine (OPTIVAR) 0.05 % ophthalmic solution PLACE TWO DROPS INTO BOTH EYES TWICE DAILY FOR DRY EYES 12/26/18   Collie Siad A, MD  b complex vitamins tablet Take 1 tablet by mouth daily.    [provider]  benzonatate (TESSALON) 200 MG capsule Take 1 capsule (200 mg total) by mouth 3 (three) times daily as needed for cough. 01/05/19   Doristine Bosworth, MD  Biotin 1000 MCG CHEW Chew 5,000 mcg by mouth.    [provider]  budesonide-formoterol (SYMBICORT)  160-4.5 MCG/ACT inhaler Inhale 2 puffs into the lungs 2 (two) times daily. 04/11/18   Sherren Mocha, MD  Calcium-Magnesium-Zinc [phone] MG TABS Take by mouth.    [provider]  carisoprodol (SOMA) 350 MG tablet TAKE 1 TABLET (350 MG TOTAL) BY MOUTH 4 (FOUR) TIMES DAILY AS NEEDED FOR MUSCLE SPASMS. 03/01/19   Doristine Bosworth, MD  cetirizine (ZYRTEC) 10 MG tablet Take 10 mg by mouth daily as needed for allergies.     [provider]  cholecalciferol (VITAMIN D) 400 UNITS TABS Take 400 Units by mouth daily.    [provider]  citalopram (CELEXA) 20 MG tablet TAKE 1 TABLET BY MOUTH EVERY DAY 01/02/19   Collie Siad A, MD  CREON 36000 units CPEP capsule TAKE 1 CAPSULES BY MOUTH AS DIRECTED 4 CAPS WITH A MEAL AND 1 CAP WITH SNACKS 06/26/18   Unk Lightning, PA  esomeprazole (NEXIUM) 40 MG capsule TAKE 1 CAPSULE BY MOUTH EVERY DAY 09/13/18   Sherren Mocha, MD  HYDROcodone-homatropine [facility]) 5-1.5 MG/5ML syrup Take 5 mLs by mouth every 8 (eight) hours as needed for cough. 01/23/19   Doristine Bosworth, MD  ipratropium (ATROVENT) 0.03 % nasal spray Place 2 sprays into the nose 4 (four) times daily as needed for rhinitis. 01/05/19   Doristine Bosworth, MD  levothyroxine (SYNTHROID) 75 MCG tablet Take 1 tablet (75 mcg total) by mouth daily before breakfast. ON EMPTY STOMACH WITH A SMALL GLASS OF WATER, NO MEDS OR FOODS FOR 20 MIN 09/26/18   Sherren Mocha, MD  montelukast (SINGULAIR) 10 MG tablet Take 1 tablet (10 mg total) by mouth every morning. 09/26/18   Sherren Mocha, MD  Multiple Vitamin (MULTIVITAMIN) tablet Take 1 tablet by mouth daily.    [provider]  ondansetron (ZOFRAN) 4 MG tablet Take 1 tablet (4 mg total) by mouth every 8 (eight) hours as needed for up to 15 doses for nausea or vomiting. 04/17/19   Talha Iser, DO  oxyCODONE (ROXICODONE) 5 MG immediate release tablet Take 1 tablet (5 mg total) by mouth every 6 (six) hours as needed for up to 12 doses for severe  pain. 04/17/19   Dijon Kohlman, DO  predniSONE (DELTASONE) 20 MG tablet 3 tabs po qam x 2d, 2 tabs po qam x 2d, 1 tab po qam x 2d 09/26/18   Sherren Mocha, MD  pregabalin (LYRICA) 150 MG capsule TAKE 1 CAPSULE (150 MG TOTAL) BY MOUTH 3 (THREE) TIMES DAILY. 09/26/18   Sherren Mocha, MD  SUMAtriptan (IMITREX) 100 MG tablet TAKE 1 TAB BY MOUTH EVERY 2 HOURS AS NEEDED FOR MIGRAINE. NO MORE THAN 2 DOSES IN 24 HOURS. 11/15/18   Sherren Mocha, MD  telmisartan-hydrochlorothiazide (MICARDIS HCT) 80-25 MG tablet Take 1 tablet by mouth daily. 09/26/18   Sherren Mocha, MD  traMADol [name]) 50 MG tablet  TAKE 1 TABLET 4 TIMES A DAY 03/01/19   Collie SiadStallings, Zoe A, MD  vitamin C (ASCORBIC ACID) 500 MG tablet Take 1,000 mg by mouth daily.    [provider]  vitamin E 400 UNIT capsule Take 400 Units by mouth daily.    [provider]    Family History Family History  Problem Relation Age of Onset  . COPD Mother   . Fibromyalgia Mother   . Heart disease Father   . Colon polyps Father   . Stroke Sister   . Fibromyalgia Sister   . Colon polyps Maternal Grandmother   . Lung disease Paternal Grandmother   . Stroke Paternal Grandmother   . Diabetes Paternal Grandfather   . Heart disease Paternal Grandfather   . Diabetes Maternal Uncle   . Colon polyps Maternal Aunt   . Aortic aneurysm Maternal Aunt   . Cystic kidney disease Maternal Aunt        kidney cyst  . Kidney disease Maternal Aunt   . Fibromyalgia Other        cousin  . Colon cancer Neg Hx     Social History Social History   Tobacco Use  . Smoking status: Never Smoker  . Smokeless tobacco: Never Used  Substance Use Topics  . Alcohol use: No    Alcohol/week: 0.0 standard drinks  . Drug use: No     Allergies   Soap and Sulfa antibiotics   Review of Systems Review of Systems  Constitutional: Negative for chills and fever.  HENT: Negative for ear pain and sore throat.   Eyes: Negative for pain and visual disturbance.   Respiratory: Negative for cough and shortness of breath.   Cardiovascular: Negative for chest pain and palpitations.  Gastrointestinal: Positive for abdominal pain. Negative for anorexia, constipation, flatus, nausea and vomiting.  Genitourinary: Negative for dysuria and hematuria.  Musculoskeletal: Negative for arthralgias and back pain.  Skin: Negative for color change and rash.  Neurological: Negative for seizures and syncope.  All other systems reviewed and are negative.    Physical Exam Updated Vital Signs  ED Triage Vitals  Enc Vitals Group     BP 04/17/19 1632 138/83     Pulse Rate 04/17/19 1632 (!) 105     Resp 04/17/19 1632 20     Temp 04/17/19 1632 98.9 F (37.2 C)     Temp Source 04/17/19 1632 Oral     SpO2 04/17/19 1632 97 %     Weight 04/17/19 1633 264 lb (119.7 kg)     Height 04/17/19 1633 5\' 3"  (1.6 m)     Head Circumference --      Peak Flow --      Pain Score 04/17/19 1630 6     Pain Loc --      Pain Edu? --      Excl. in GC? --     Physical Exam Vitals signs and nursing note reviewed.  Constitutional:      General: She is not in acute distress.    Appearance: She is well-developed. She is not ill-appearing.  HENT:     Head: Normocephalic and atraumatic.  Eyes:     Extraocular Movements: Extraocular movements intact.     Conjunctiva/sclera: Conjunctivae normal.     Pupils: Pupils are equal, round, and reactive to light.  Neck:     Musculoskeletal: Neck supple.  Cardiovascular:     Rate and Rhythm: Normal rate and regular rhythm.     Heart sounds:  Normal heart sounds. No murmur.  Pulmonary:     Effort: Pulmonary effort is normal. No respiratory distress.     Breath sounds: Normal breath sounds.  Abdominal:     Palpations: Abdomen is soft.     Tenderness: There is abdominal tenderness in the epigastric area. There is no right CVA tenderness, left CVA tenderness, guarding or rebound. Negative signs include Murphy's sign and Rovsing's sign.   Genitourinary:    Rectum: Normal.  Skin:    General: Skin is warm and dry.  Neurological:     Mental Status: She is alert.      ED Treatments / Results  Labs (all labs ordered are listed, but only abnormal results are displayed) Labs Reviewed  CBC WITH DIFFERENTIAL/PLATELET - Abnormal; Notable for the following components:      Result Value   WBC 16.3 (*)    Neutro Abs 11.2 (*)    Monocytes Absolute 1.3 (*)    Abs Immature Granulocytes 0.08 (*)    All other components within normal limits  COMPREHENSIVE METABOLIC PANEL - Abnormal; Notable for the following components:   Sodium 128 (*)    Potassium 3.2 (*)    Chloride 88 (*)    Glucose, Bld 107 (*)    All other components within normal limits  URINALYSIS, ROUTINE W REFLEX MICROSCOPIC - Abnormal; Notable for the following components:   APPearance CLOUDY (*)    Hgb urine dipstick MODERATE (*)    Bilirubin Urine SMALL (*)    Protein, ur 30 (*)    Leukocytes,Ua MODERATE (*)    All other components within normal limits  URINALYSIS, MICROSCOPIC (REFLEX) - Abnormal; Notable for the following components:   Bacteria, UA FEW (*)    All other components within normal limits  LIPASE, BLOOD    EKG None  Radiology Ct Abdomen Pelvis W Contrast  Result Date: 04/17/2019 CLINICAL DATA:  Abdominal pain for 4 days, referral from primary care provider EXAM: CT ABDOMEN AND PELVIS WITH CONTRAST TECHNIQUE: Multidetector CT imaging of the abdomen and pelvis was performed using the standard protocol following bolus administration of intravenous contrast. CONTRAST:  133mL OMNIPAQUE IOHEXOL 300 MG/ML  SOLN COMPARISON:  CT abdomen pelvis May 26, 2017 FINDINGS: Lower chest: No acute abnormality. Mitral annular calcifications present. Hepatobiliary: Tiny punctate calcifications in the hepatic parenchyma, likely sequela of prior granulomatous disease. No concerning hepatic lesions. Normal gallbladder. No biliary ductal dilatation. No calcified  intraductal gallstones. Pancreas: Inflammation situated about the head of the pancreas predominately within the paraduodenal groove with adjacent reactive adenopathy. No discrete fluid collection is identified. Pancreatic body and tail are unremarkable. No pancreatic ductal dilatation. Spleen: Scattered punctate splenic calcifications compatible with remote granulomatous disease. No concerning splenic lesions. Adrenals/Urinary Tract: Adrenal glands are unremarkable. Fluid attenuation 2.2 cm fluid attenuation cyst in the interpolar region right kidney. Kidneys are otherwise normal, without renal calculi, focal lesion, or hydronephrosis. Bladder is under distended with mild bladder wall thickening. Stomach/Bowel: Stomach is within normal limits. Appendix appears normal. Stable duodenal diverticulum. No evidence of bowel wall thickening, distention, or inflammatory changes. Vascular/Lymphatic: Minimal atheromatous plaque. No other significant vascular findings are present. Reactive lymph nodes are present near the pancreatic head. No other enlarged abdominal or pelvic lymph nodes. Reproductive: Uterus and bilateral adnexa are unremarkable. Other: Small fat containing umbilical hernia. Additional tiny fat containing ventral hernia just superior to the umbilicus. No bowel containing hernia. No free intraperitoneal gas or fluid. Musculoskeletal: Mild body wall edema is seen posteriorly and  in the flanks. No acute fracture or soft tissue abnormality. Mild degenerative changes in the spine IMPRESSION: 1. 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 Correlation with lipase is recommended. No biliary or pancreatic ductal dilatation or visualized calcified gallstones. No organized collection or abscess. 2. Circumferential thickening of the bladder wall, possibly related to underdistention although if urinary symptoms are  present, consider correlation with urinalysis. 3. Findings of remote granulomatous disease within the abdomen including splenic and hepatic calcifications. Electronically Signed   By: MD Kreg ShropshirePrice  DeHay   On: 04/17/2019 18:26    Procedures Procedures (including critical care time)  Medications Ordered in ED Medications  oxyCODONE (Oxy IR/ROXICODONE) immediate release tablet 5 mg (has no administration in time range)  potassium chloride 20 MEQ/15ML (10%) solution 40 mEq (has no administration in time range)  sodium chloride 0.9 % bolus 1,000 mL (1,000 mLs Intravenous New Bag/Given 04/17/19 1717)  fentaNYL (SUBLIMAZE) injection 50 mcg (50 mcg Intravenous Given 04/17/19 1719)  ondansetron (ZOFRAN) injection 4 mg (4 mg Intravenous Given 04/17/19 1718)  iohexol (OMNIPAQUE) 300 MG/ML solution 100 mL (100 mLs Intravenous Contrast Given 04/17/19 1741)     Initial Impression / Assessment and Plan / ED Course  I have reviewed the triage vital signs and the nursing notes.  Pertinent labs & imaging results that were available during my care of the patient were reviewed by me and considered in my medical decision making (see chart for details).     Britta MccreedyBarbara Rapp-Austin is a 53 year old female with history of fibromyalgia, pancreatitis who presents to the ED with epigastric abdominal pain.  Concern for pancreatitis.  Normal vitals.  No fever.  Has had some nausea and pain.  No chest pain, no shortness of breath.  No black stools.  No alcohol abuse.  Patient mildly tender in the epigastric region.  Will get lab work and CT scan.  Will give IV hydration, IV pain meds.  Will reevaluate.  Patient does not have any urinary symptoms.  Not particularly tender in the gallbladder region.  Lipase normal.  Gallbladder and liver enzymes within normal limits.  Urinalysis unremarkable.  Patient with mild hyponatremia and hypokalemia.  Potassium was repleted.  CT scan showed grooved pancreatitis.  No abscess.  Patient did not want  to be admitted.  She felt like her symptoms have improved.  She understands her symptoms as she does have chronic pancreatitis.  Shared decision was made to trial pain medications and liquid diet at home.  She understands return precautions including worsening pain, fever.  Patient has no active narcotic scripts.  Roxicodone and Zofran prescription was sent to pharmacy.  Patient discharged in good condition.  Given return precautions.  This chart was dictated using voice recognition software.  Despite best efforts to proofread,  errors can occur which can change the documentation meaning.    Final Clinical Impressions(s) / ED Diagnoses   Final diagnoses:  Acute pancreatitis, unspecified complication status, unspecified pancreatitis type    ED Discharge Orders         Ordered    oxyCODONE (ROXICODONE) 5 MG immediate release tablet  Every 6 hours PRN     04/17/19 1841    ondansetron (ZOFRAN) 4 MG tablet  Every 8 hours PRN     04/17/19 1841           Virgina NorfolkCuratolo, Millee Denise, DO 04/17/19 1842

## 2019-07-07 ENCOUNTER — Other Ambulatory Visit: Payer: Self-pay | Admitting: Physician Assistant

## 2020-01-10 ENCOUNTER — Other Ambulatory Visit: Payer: Self-pay | Admitting: Family Medicine

## 2020-02-12 ENCOUNTER — Other Ambulatory Visit: Payer: Self-pay | Admitting: Family Medicine

## 2020-02-12 ENCOUNTER — Other Ambulatory Visit: Payer: Self-pay | Admitting: Diagnostic Radiology

## 2020-02-12 DIAGNOSIS — Z1231 Encounter for screening mammogram for malignant neoplasm of breast: Secondary | ICD-10-CM

## 2020-03-28 ENCOUNTER — Other Ambulatory Visit: Payer: Self-pay

## 2020-03-28 ENCOUNTER — Emergency Department (HOSPITAL_BASED_OUTPATIENT_CLINIC_OR_DEPARTMENT_OTHER): Payer: 59

## 2020-03-28 ENCOUNTER — Encounter (HOSPITAL_BASED_OUTPATIENT_CLINIC_OR_DEPARTMENT_OTHER): Payer: Self-pay | Admitting: *Deleted

## 2020-03-28 ENCOUNTER — Emergency Department (HOSPITAL_BASED_OUTPATIENT_CLINIC_OR_DEPARTMENT_OTHER)
Admission: EM | Admit: 2020-03-28 | Discharge: 2020-03-28 | Disposition: A | Discharge status: Home / Self Care | Payer: 59 | Attending: Emergency Medicine | Admitting: Emergency Medicine

## 2020-03-28 DIAGNOSIS — R1013 Epigastric pain: Secondary | ICD-10-CM | POA: Diagnosis present

## 2020-03-28 DIAGNOSIS — I10 Essential (primary) hypertension: Secondary | ICD-10-CM | POA: Diagnosis not present

## 2020-03-28 DIAGNOSIS — R197 Diarrhea, unspecified: Secondary | ICD-10-CM | POA: Insufficient documentation

## 2020-03-28 DIAGNOSIS — E039 Hypothyroidism, unspecified: Secondary | ICD-10-CM | POA: Diagnosis not present

## 2020-03-28 DIAGNOSIS — Z882 Allergy status to sulfonamides status: Secondary | ICD-10-CM | POA: Insufficient documentation

## 2020-03-28 DIAGNOSIS — J45909 Unspecified asthma, uncomplicated: Secondary | ICD-10-CM | POA: Diagnosis not present

## 2020-03-28 DIAGNOSIS — R109 Unspecified abdominal pain: Secondary | ICD-10-CM

## 2020-03-28 LAB — CBC WITH DIFFERENTIAL/PLATELET
Abs Immature Granulocytes: 0.06 10*3/uL (ref 0.00–0.07)
Basophils Absolute: 0.1 10*3/uL (ref 0.0–0.1)
Basophils Relative: 1 %
Eosinophils Absolute: 0.3 10*3/uL (ref 0.0–0.5)
Eosinophils Relative: 3 %
HCT: 41.9 % (ref 36.0–46.0)
Hemoglobin: 13.8 g/dL (ref 12.0–15.0)
Immature Granulocytes: 1 %
Lymphocytes Relative: 23 %
Lymphs Abs: 2.1 10*3/uL (ref 0.7–4.0)
MCH: 30.3 pg (ref 26.0–34.0)
MCHC: 32.9 g/dL (ref 30.0–36.0)
MCV: 92.1 fL (ref 80.0–100.0)
Monocytes Absolute: 0.9 10*3/uL (ref 0.1–1.0)
Monocytes Relative: 9 %
Neutro Abs: 6.1 10*3/uL (ref 1.7–7.7)
Neutrophils Relative %: 63 %
Platelets: 212 10*3/uL (ref 150–400)
RBC: 4.55 MIL/uL (ref 3.87–5.11)
RDW: 12.2 % (ref 11.5–15.5)
WBC: 9.4 10*3/uL (ref 4.0–10.5)
nRBC: 0 % (ref 0.0–0.2)

## 2020-03-28 LAB — URINALYSIS, ROUTINE W REFLEX MICROSCOPIC
Bilirubin Urine: NEGATIVE
Glucose, UA: NEGATIVE mg/dL
Ketones, ur: NEGATIVE mg/dL
Leukocytes,Ua: NEGATIVE
Nitrite: NEGATIVE
Protein, ur: NEGATIVE mg/dL
Specific Gravity, Urine: <1.005 — ABNORMAL LOW (ref 1.005–1.030)
pH: 5.5 (ref 5.0–8.0)

## 2020-03-28 LAB — COMPREHENSIVE METABOLIC PANEL
ALT: 21 U/L (ref 0–44)
AST: 22 U/L (ref 15–41)
Albumin: 3.9 g/dL (ref 3.5–5.0)
Alkaline Phosphatase: 78 U/L (ref 38–126)
Anion gap: 12 (ref 5–15)
BUN: 15 mg/dL (ref 6–20)
CO2: 26 mmol/L (ref 22–32)
Calcium: 9.1 mg/dL (ref 8.9–10.3)
Chloride: 98 mmol/L (ref 98–111)
Creatinine, Ser: 0.69 mg/dL (ref 0.44–1.00)
GFR calc Af Amer: >60 mL/min (ref >60)
GFR calc non Af Amer: >60 mL/min (ref >60)
Glucose, Bld: 109 mg/dL — ABNORMAL HIGH (ref 70–99)
Potassium: 3.5 mmol/L (ref 3.5–5.1)
Sodium: 136 mmol/L (ref 135–145)
Total Bilirubin: 1 mg/dL (ref 0.3–1.2)
Total Protein: 7.4 g/dL (ref 6.5–8.1)

## 2020-03-28 LAB — URINALYSIS, MICROSCOPIC (REFLEX)

## 2020-03-28 LAB — LIPASE, BLOOD: Lipase: 21 U/L (ref 11–51)

## 2020-03-28 MED ORDER — IOHEXOL 300 MG/ML  SOLN
100.0000 mL | Freq: Once | INTRAMUSCULAR | Status: AC | PRN
  Administered 2020-03-28: 100 mL via INTRAVENOUS

## 2020-03-28 MED ORDER — ONDANSETRON HCL 4 MG/2ML IJ SOLN
4.0000 mg | Freq: Once | INTRAMUSCULAR | Status: AC
  Administered 2020-03-28: 4 mg via INTRAVENOUS
  Filled 2020-03-28: qty 2

## 2020-03-28 MED ORDER — MORPHINE SULFATE (PF) 4 MG/ML IV SOLN
4.0000 mg | Freq: Once | INTRAVENOUS | Status: AC
  Administered 2020-03-28: 4 mg via INTRAVENOUS
  Filled 2020-03-28: qty 1

## 2020-03-28 MED ORDER — SODIUM CHLORIDE 0.9 % IV BOLUS
1000.0000 mL | Freq: Once | INTRAVENOUS | Status: AC
  Administered 2020-03-28: 1000 mL via INTRAVENOUS

## 2020-03-28 NOTE — ED Notes (Signed)
ED Provider at bedside. 

## 2020-03-28 NOTE — ED Triage Notes (Signed)
Abdominal pain since Saturday night, diarrhea x 1.  Denies nausea or vomiting or fever.

## 2020-03-28 NOTE — Discharge Instructions (Addendum)
You have been seen today for abdominal pain. Please read and follow all provided instructions. Return to the emergency room for worsening condition or new concerning symptoms.    Your lab work today was all normal.  Your CT scan did not show any signs of pancreatitis or other possible infections that could be causing your symptoms.  1. Medications:  Continue usual home medications Take medications as prescribed. Please review all of the medicines and only take them if you do not have an allergy to them.   2. Treatment: rest, drink plenty of fluids  3. Follow Up:  Please follow up with primary care provider by scheduling an appointment as soon as possible for a visit  If you continue to have diarrhea you can discuss the possibility of having stool cultures collected by your primary care doctor when you follow-up with them.   ?

## 2020-03-28 NOTE — ED Provider Notes (Signed)
[facility] EMERGENCY DEPARTMENT Provider Note   CSN: [id number] Arrival date & time: 03/28/20  1016     History Chief Complaint  Patient presents with  . Abdominal Pain  . Diarrhea, Loss of appetite    Gloria Carter is a 54 y.o. female with past medical history significant for GERD, chronic pancreatitis, fibromyalgia, hypertension, hyperlipidemia, IBS, migraines, thyroid disease.  HPI Patient presents to emergency department today with chief complaint of progressively worsening abdominal pain and diarrhea x6 days.  She states diarrhea is nonbloody, not black.  She has had one episode of diarrhea in the last 24 hours.  She states her abdominal pain is located in her epigastric area and left upper and lower quadrants.  She states the pain started as intermittent however is now constant.  She describes the pain as a throbbing sensation.  She rates the pain 5 out of 10 in severity.  She is also endorsing decreased appetite and p.o. intake.  She has intermittent nausea without emesis.  She has pain medications at home which she took without any symptom improvement.  She denies fever, chills, chest pain, shortness of breath, back pain, urinary symptoms, pelvic pain, vaginal discharge, abnormal vaginal bleeding.  Patient denies alcohol consumption or substance abuse.  No recent antibiotics, no recent travel or suspicious food intake. No sick contacts. She had similar symptoms x1 year ago and was found to have pancreatitis on CT. Abdominal surgical history includes ventral hernia repair    Past Medical History:  Diagnosis Date  . ADD (attention deficit disorder)   . Allergy    ENVIRONMENTAL  . Arthritis    knees  . Asthma   . Depression   . Difficulty sleeping   . Dysphagia   . Fibromyalgia   . GERD (gastroesophageal reflux disease)   . Hernia of abdominal wall   . Hyperlipidemia   . Hypertension   . IBS (irritable bowel syndrome)   . Migraine   . Numbness and tingling  in hands   . Obesity   . Swelling of both ankles   . Thyroid disease     Patient Active Problem List   Diagnosis Date Noted  . Metabolic syndrome 05/18/2017  . Acute pancreatitis without infection or necrosis 04/26/2017  . Vitamin D deficiency 06/09/2015  . Degeneration of lumbar or lumbosacral intervertebral disc 05/22/2015  . Right lumbar radiculopathy 05/22/2015  . Prediabetes 09/08/2013  . Hypothyroidism 08/31/2013  . Asthma, chronic 08/31/2013  . Upper airway cough syndrome 08/31/2013  . Carpal tunnel syndrome 06/01/2013  . Wrist tendonitis 06/01/2013  . Encounter for medication monitoring 12/01/2012  . HTN (hypertension) 08/13/2012  . BMI 45.0-49.9, adult (HCC) 08/13/2012  . Hyperlipidemia 08/13/2012  . IBS (irritable bowel syndrome) 08/13/2012  . ADD (attention deficit disorder) 02/07/2012  . Migraine 02/07/2012  . Dysphagia 02/07/2012  . Hernia of abdominal wall 02/07/2012  . RAD (reactive airway disease) 12/20/2011  . Fibromyalgia 12/20/2011  . Chronic pain 12/20/2011    Past Surgical History:  Procedure Laterality Date  . INSERTION OF MESH N/A 03/28/2014   Procedure: INSERTION OF MESH;  Surgeon: Velora Heckler, MD;  Location: [facility] ORS;  Service: General;  Laterality: N/A;  . TONSILECTOMY/ADENOIDECTOMY WITH MYRINGOTOMY    . VENTRAL HERNIA REPAIR N/A 03/28/2014   Procedure: LAPAROSCOPIC VENTRAL HERNIA REPAIR WITH MESH;  Surgeon: Velora Heckler, MD;  Location: [facility] ORS;  Service: General;  Laterality: N/A;  . WISDOM TOOTH EXTRACTION       OB History  No obstetric history on file.     Family History  Problem Relation Age of Onset  . COPD Mother   . Fibromyalgia Mother   . Heart disease Father   . Colon polyps Father   . Stroke Sister   . Fibromyalgia Sister   . Colon polyps Maternal Grandmother   . Lung disease Paternal Grandmother   . Stroke Paternal Grandmother   . Diabetes Paternal Grandfather   . Heart disease Paternal Grandfather   . Diabetes Maternal  Uncle   . Colon polyps Maternal Aunt   . Aortic aneurysm Maternal Aunt   . Cystic kidney disease Maternal Aunt        kidney cyst  . Kidney disease Maternal Aunt   . Fibromyalgia Other        cousin  . Colon cancer Neg Hx     Social History   Tobacco Use  . Smoking status: Never Smoker  . Smokeless tobacco: Never Used  Substance Use Topics  . Alcohol use: No    Alcohol/week: 0.0 standard drinks  . Drug use: No    Home Medications Prior to Admission medications   Medication Sig Start Date End Date Taking? Authorizing Provider  albuterol (VENTOLIN HFA) 108 (90 Base) MCG/ACT inhaler TAKE 2 PUFFS BY MOUTH EVERY 6 HOURS AS NEEDED FOR WHEEZE OR SHORTNESS OF BREATH 03/10/19  Yes Stallings, Zoe A, MD  azelastine (OPTIVAR) 0.05 % ophthalmic solution PLACE TWO DROPS INTO BOTH EYES TWICE DAILY FOR DRY EYES 12/26/18  Yes Stallings, Zoe A, MD  budesonide-formoterol (SYMBICORT) 160-4.5 MCG/ACT inhaler Inhale 2 puffs into the lungs 2 (two) times daily. 04/11/18  Yes Sherren Mocha, MD  carisoprodol (SOMA) 350 MG tablet TAKE 1 TABLET (350 MG TOTAL) BY MOUTH 4 (FOUR) TIMES DAILY AS NEEDED FOR MUSCLE SPASMS. 03/01/19  Yes Stallings, Zoe A, MD  cetirizine (ZYRTEC) 10 MG tablet Take 10 mg by mouth daily as needed for allergies.    Yes [provider]  citalopram (CELEXA) 20 MG tablet TAKE 1 TABLET BY MOUTH EVERY DAY 01/02/19  Yes Stallings, Zoe A, MD  CREON 36000 units CPEP capsule TAKE 1 CAPSULES BY MOUTH AS DIRECTED 4 CAPS WITH A MEAL AND 1 CAP WITH SNACKS 06/26/18  Yes Unk Lightning, PA  esomeprazole (NEXIUM) 40 MG capsule TAKE 1 CAPSULE BY MOUTH EVERY DAY 09/13/18  Yes Sherren Mocha, MD  levothyroxine (SYNTHROID) 75 MCG tablet Take 1 tablet (75 mcg total) by mouth daily before breakfast. ON EMPTY STOMACH WITH A SMALL GLASS OF WATER, NO MEDS OR FOODS FOR 20 MIN 09/26/18  Yes Sherren Mocha, MD  montelukast (SINGULAIR) 10 MG tablet Take 1 tablet (10 mg total) by mouth every morning. 09/26/18  Yes  Sherren Mocha, MD  pregabalin (LYRICA) 150 MG capsule TAKE 1 CAPSULE (150 MG TOTAL) BY MOUTH 3 (THREE) TIMES DAILY. 09/26/18  Yes Sherren Mocha, MD  telmisartan-hydrochlorothiazide (MICARDIS HCT) 80-25 MG tablet Take 1 tablet by mouth daily. 09/26/18  Yes Sherren Mocha, MD  traMADol (ULTRAM) 50 MG tablet TAKE 1 TABLET 4 TIMES A DAY 03/01/19  Yes Stallings, Zoe A, MD  amphetamine-dextroamphetamine (ADDERALL) 10 MG tablet Take 1 tablet (10 mg total) by mouth 2 (two) times daily with a meal. 03/28/19   Collie Siad A, MD  b complex vitamins tablet Take 1 tablet by mouth daily.    [provider]  Biotin 1000 MCG CHEW Chew 5,000 mcg by mouth.    [provider]  Calcium-Magnesium-Zinc 167-83-8 MG TABS Take by mouth.    [provider]  cholecalciferol (VITAMIN D) 400 UNITS TABS Take 400 Units by mouth daily.    [provider]  ipratropium (ATROVENT) 0.03 % nasal spray Place 2 sprays into the nose 4 (four) times daily as needed for rhinitis. 01/05/19   Doristine Bosworth, MD  Multiple Vitamin (MULTIVITAMIN) tablet Take 1 tablet by mouth daily.    [provider]  ondansetron (ZOFRAN) 4 MG tablet Take 1 tablet (4 mg total) by mouth every 8 (eight) hours as needed for up to 15 doses for nausea or vomiting. 04/17/19   Curatolo, Adam, DO  SUMAtriptan (IMITREX) 100 MG tablet TAKE 1 TAB BY MOUTH EVERY 2 HOURS AS NEEDED FOR MIGRAINE. NO MORE THAN 2 DOSES IN 24 HOURS. 11/15/18   Sherren Mocha, MD  vitamin C (ASCORBIC ACID) 500 MG tablet Take 1,000 mg by mouth daily.    [provider]  vitamin E 400 UNIT capsule Take 400 Units by mouth daily.    [provider]    Allergies    Soap and Sulfa antibiotics  Review of Systems   Review of Systems All other systems are reviewed and are negative for acute change except as noted in the HPI.  Physical Exam Updated Vital Signs BP (!) 151/77 (BP Location: Right Arm)   Pulse 98   Temp 98.2 F (36.8 C) (Oral)    Resp 20   Ht 5' 2.5" (1.588 m)   Wt 127.1 kg   SpO2 97%   BMI 50.45 kg/m   Physical Exam Vitals and nursing note reviewed.  Constitutional:      General: She is not in acute distress.    Appearance: She is not ill-appearing.  HENT:     Head: Normocephalic and atraumatic.     Right Ear: Tympanic membrane and external ear normal.     Left Ear: Tympanic membrane and external ear normal.     Nose: Nose normal.     Mouth/Throat:     Mouth: Mucous membranes are moist.     Pharynx: Oropharynx is clear.  Eyes:     General: No scleral icterus.       Right eye: No discharge.        Left eye: No discharge.     Extraocular Movements: Extraocular movements intact.     Conjunctiva/sclera: Conjunctivae normal.     Pupils: Pupils are equal, round, and reactive to light.  Neck:     Vascular: No JVD.  Cardiovascular:     Rate and Rhythm: Normal rate and regular rhythm.     Pulses: Normal pulses.          Radial pulses are 2+ on the right side and 2+ on the left side.     Heart sounds: Normal heart sounds.  Pulmonary:     Comments: Lungs clear to auscultation in all fields. Symmetric chest rise. No wheezing, rales, or rhonchi. Abdominal:     General: Bowel sounds are normal.     Tenderness: There is no right CVA tenderness or left CVA tenderness.     Hernia: No hernia is present.     Comments: Abdomen is soft, non-distended.  Tender to palpation of epigastric area and left upper quadrant.  No rigidity, no guarding. No peritoneal signs.  Musculoskeletal:        General: Normal range of motion.     Cervical back: Normal range of motion.  Skin:  General: Skin is warm and dry.     Capillary Refill: Capillary refill takes less than 2 seconds.  Neurological:     Mental Status: She is oriented to person, place, and time.     GCS: GCS eye subscore is 4. GCS verbal subscore is 5. GCS motor subscore is 6.     Comments: Fluent speech, no facial droop.  Psychiatric:        Behavior: Behavior  normal.     ED Results / Procedures / Treatments   Labs (all labs ordered are listed, but only abnormal results are displayed) Labs Reviewed  COMPREHENSIVE METABOLIC PANEL - Abnormal; Notable for the following components:      Result Value   Glucose, Bld 109 (*)    All other components within normal limits  URINALYSIS, ROUTINE W REFLEX MICROSCOPIC - Abnormal; Notable for the following components:   Specific Gravity, Urine <1.005 (*)    Hgb urine dipstick SMALL (*)    All other components within normal limits  URINALYSIS, MICROSCOPIC (REFLEX) - Abnormal; Notable for the following components:   Bacteria, UA FEW (*)    All other components within normal limits  CBC WITH DIFFERENTIAL/PLATELET  LIPASE, BLOOD    EKG None  Radiology CT Abdomen Pelvis W Contrast  Result Date: 03/28/2020 CLINICAL DATA:  Patient has had pain in left to mid abdomen for a week with diarrhea. Hx of hernia repair. EXAM: CT ABDOMEN AND PELVIS WITH CONTRAST TECHNIQUE: Multidetector CT imaging of the abdomen and pelvis was performed using the standard protocol following bolus administration of intravenous contrast. CONTRAST:  153mL OMNIPAQUE IOHEXOL 300 MG/ML  SOLN COMPARISON:  None.  CT abdomen pelvis 04/17/2019 FINDINGS: Lower chest: Minimal atelectasis. Hepatobiliary: No focal liver abnormality is seen. No gallstones, gallbladder wall thickening, or biliary dilatation. Pancreas: Unremarkable. No pancreatic ductal dilatation or surrounding inflammatory changes. Spleen: Scattered calcified granulomas.  Normal size. Adrenals/Urinary Tract: Adrenal glands are unremarkable. There is a cyst in the right kidney. No hydronephrosis. No renal calculi. Urinary bladder unremarkable. Stomach/Bowel: Stomach is within normal limits. Appendix appears normal. No evidence of bowel wall thickening, distention, or inflammatory changes. Scattered colonic diverticula without evidence of diverticulitis. Vascular/Lymphatic: Mild aortic  atherosclerosis. No enlarged abdominal or pelvic lymph nodes. Reproductive: Status post hysterectomy. No adnexal masses. Other: Small fat containing umbilical hernia. There is an additional small supraumbilical fat containing hernia. No abdominopelvic ascites. Musculoskeletal: No acute or significant osseous findings. IMPRESSION: 1. No acute findings in the abdomen or pelvis. 2. Small fat containing umbilical and supraumbilical hernias. 3. Aortic atherosclerosis. Aortic Atherosclerosis (ICD10-I70.0). Electronically Signed   By: Audie Pinto M.D.   On: 03/28/2020 12:51    Procedures Procedures (including critical care time)  Medications Ordered in ED Medications  sodium chloride 0.9 % bolus 1,000 mL (0 mLs Intravenous Stopped 03/28/20 1258)  morphine 4 MG/ML injection 4 mg (4 mg Intravenous Given 03/28/20 1059)  ondansetron (ZOFRAN) injection 4 mg (4 mg Intravenous Given 03/28/20 1059)  iohexol (OMNIPAQUE) 300 MG/ML solution 100 mL (100 mLs Intravenous Contrast Given 03/28/20 1212)    ED Course  I have reviewed the triage vital signs and the nursing notes.  Pertinent labs & imaging results that were available during my care of the patient were reviewed by me and considered in my medical decision making (see chart for details).    MDM Rules/Calculators/A&P  History provided by patient with additional history obtained from chart review.    54 year old female presenting with abdominal pain, diarrhea, decreased appetite.  Has history of pancreatitis and reports this feels similar.  Patient presents awake, alert, hemodynamically stable, afebrile, non toxic.  No tachycardia or hypoxia.  On exam she has tenderness palpation of epigastric area and left upper quadrant without peritoneal signs.  No CVA tenderness.  Looking through her chart it appears she has had pancreatitis via CT with a normal lipase.  Will check abdominal labs and obtain CT imaging as she has been found to  have pancreatitis on imaging with normal lipase in the past.  Patient given antiemetics, analgesics and IV fluids. DDx includes pancreatitis, diverticulitis, gastritis, cholecystitis, biliary colic, less likely to be appendicitis, SBO.  Labs today include CBC, CMP, and lipase which are all unremarkable. UA without signs of infection. CT viewed by me is without signs of pancreatitis or diverticulitis, no acute findings. I updated patient on results.  She is tolerating p.o. intake.  Serial abdominal exams have been benign.  No signs of acute surgical abdomen.  Patient feels she can manage her symptoms at home.  She has pain medicine and Zofran at home, no further medications needed at discharge.  The patient appears reasonably screened and/or stabilized for discharge and I doubt any other medical condition or other [facility] requiring further screening, evaluation, or treatment in the ED at this time prior to discharge. The patient is safe for discharge with strict return precautions discussed. Recommend pcp follow up if symptoms persist with possibility of needing stool cultures as patient had no episodes of diarrhea in the emergency department for collection.    Portions of this note were generated with Scientist, clinical (histocompatibility and immunogenetics)Dragon dictation software. Dictation errors may occur despite best attempts at proofreading.    Final Clinical Impression(s) / ED Diagnoses Final diagnoses:  Abdominal pain, unspecified abdominal location    Rx / DC Orders ED Discharge Orders    None       Sherene Sireslbrizze, Careem Yasui E, PA-C 03/28/20 1321    Vanetta MuldersZackowski, Scott, MD 03/31/20 1625

## 2020-04-29 ENCOUNTER — Encounter: Payer: Self-pay | Admitting: Gastroenterology

## 2020-04-29 ENCOUNTER — Ambulatory Visit: Payer: 59 | Admitting: Gastroenterology

## 2020-04-29 VITALS — BP 164/110 | HR 96 | Ht 62.6 in | Wt 274.2 lb

## 2020-04-29 DIAGNOSIS — R109 Unspecified abdominal pain: Secondary | ICD-10-CM

## 2020-04-29 NOTE — Patient Instructions (Addendum)
If you are age 56 or older, your body mass index should be between 23-30. Your Body mass index is 49.2 kg/m. If this is out of the aforementioned range listed, please consider follow up with your Primary Care Provider.  If you are age 56 or younger, your body mass index should be between 19-25. Your Body mass index is 49.2 kg/m. If this is out of the aformentioned range listed, please consider follow up with your Primary Care Provider.   It has been recommended to you by your physician that you have a(n) colonoscopy completed. Per your request, we did not schedule the procedure(s) today. Please contact our office at [phone] should you decide to have the procedure completed. You will be scheduled for a pre-visit and procedure at that time.  It has been recommended that you speak with a general surgeon to discuss having your gall bladder removed. Please take this into consideration.  Thank you for entrusting me with your care and choosing [facility].  Dr Christella Hartigan you

## 2020-04-29 NOTE — Progress Notes (Signed)
Review of pertinent gastrointestinal problems: 1.  Recurrent mild acute pancreatitis: CT scan April 2017 showed "changes consistent with acute pancreatitis involving the head and uncinate process" MRI May 2017 showed normal pancreas.  Ultrasound may 2017 showed normal gallbladder without stones.  There were also no stones on her MRI.  HIDA scan May 2017 was normal.  Normal gallbladder ejection fraction.  July 2018 CT scan abdomen and pelvis showed "prominent acute pancreatitis".  Follow-up CT scan August 2018 showed "interval improvement in previously noted pancreatitis".  Her LFTs do not elevate during the episodes and lipase barely elevates or is completely normal.    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  Pancreatic enzymes, LFTs at the time were completely normal.    Abdominal pain June 2021 with completely normal CT scan, normal pancreatic enzymes, normal LFTs   HPI: This is a very pleasant 54 year old woman whom I last saw almost 3 years ago.  Her weight is up 11 pounds since her last office visit here about 2 and half years ago.  Current BMI 49.  She has had intermittent abdominal pains which she feels are flares of pancreatitis.  See records above.  She has clearly had mild inflammatory pancreatitis on imaging in 2020, 2018, 2017.  She has had normal CT scan most recently during similar pains, June 2021.  Her pancreatic enzymes for most of these events have been completely normal.  Her liver tests have always been normal.  Micardis HCT is on her medication list however she does not take that anymore.  She stopped it about 6 weeks ago on the advice of her primary care physician.  She is taking Micardis only now  She walks with a walker  We discussed colon cancer screening and she is not interested in that  ROS: complete GI ROS  as described in HPI, all other review negative.  Constitutional:  No unintentional weight loss   Past Medical History:  Diagnosis Date  . ADD (attention deficit disorder)   . Allergy    ENVIRONMENTAL  . Arthritis    knees  . Asthma   . Depression   . Difficulty sleeping   . Dysphagia   . Fibromyalgia   . GERD (gastroesophageal reflux disease)   . Hernia of abdominal wall   . Hyperlipidemia   . Hypertension   . IBS (irritable bowel syndrome)   . Migraine   . Numbness and tingling in hands   . Obesity   . Swelling of both ankles   . Thyroid disease     Past Surgical History:  Procedure Laterality Date  . INSERTION OF MESH N/A 03/28/2014   Procedure: INSERTION OF MESH;  Surgeon: Velora Hecklerodd M Gerkin, MD;  Location: [facility];  Service: General;  Laterality: N/A;  . TONSILECTOMY/ADENOIDECTOMY WITH MYRINGOTOMY    . VENTRAL HERNIA REPAIR N/A 03/28/2014   Procedure: LAPAROSCOPIC VENTRAL HERNIA REPAIR WITH MESH;  Surgeon: Velora Hecklerodd M Gerkin, MD;  Location: [facility];  Service: General;  Laterality: N/A;  . WISDOM TOOTH EXTRACTION      Current Outpatient Medications  Medication Sig Dispense Refill  . albuterol (VENTOLIN HFA) 108 (90 Base) MCG/ACT inhaler TAKE 2 PUFFS BY MOUTH EVERY 6 HOURS AS NEEDED FOR WHEEZE OR SHORTNESS OF BREATH 6.7 Inhaler 1  . amphetamine-dextroamphetamine (ADDERALL) 10 MG tablet Take 1 tablet (10 mg total) by mouth 2 (two) times daily with a  meal. 60 tablet 0  . azelastine (OPTIVAR) 0.05 % ophthalmic solution PLACE TWO DROPS INTO BOTH EYES TWICE DAILY FOR DRY EYES 6 mL 3  . b complex vitamins tablet Take 1 tablet by mouth daily.    . Biotin 1000 MCG CHEW Chew 5,000 mcg by mouth.    . budesonide-formoterol (SYMBICORT) 160-4.5 MCG/ACT inhaler Inhale 2 puffs into the lungs 2 (two) times daily. 1 Inhaler 12  . Calcium-Magnesium-Zinc 167-83-8 MG TABS Take by mouth.    . carisoprodol (SOMA) 350 MG tablet TAKE 1 TABLET (350 MG TOTAL) BY MOUTH 4 (FOUR) TIMES DAILY AS NEEDED FOR  MUSCLE SPASMS. 120 tablet 2  . cetirizine (ZYRTEC) 10 MG tablet Take 10 mg by mouth daily as needed for allergies.     . cholecalciferol (VITAMIN D) 400 UNITS TABS Take 400 Units by mouth daily.    . citalopram (CELEXA) 20 MG tablet TAKE 1 TABLET BY MOUTH EVERY DAY 30 tablet 5  . CREON 36000 units CPEP capsule TAKE 1 CAPSULES BY MOUTH AS DIRECTED 4 CAPS WITH A MEAL AND 1 CAP WITH SNACKS 390 capsule 11  . esomeprazole (NEXIUM 24HR) 20 MG capsule Take 40 mg by mouth daily at 12 noon.    Marland Kitchen HYDROcodone-acetaminophen (NORCO/VICODIN) 5-325 MG tablet Take 1 tablet by mouth every 4 (four) hours as needed.    Marland Kitchen ipratropium (ATROVENT) 0.03 % nasal spray Place 2 sprays into the nose 4 (four) times daily as needed for rhinitis. 30 mL 0  . levothyroxine (SYNTHROID) 75 MCG tablet Take 1 tablet (75 mcg total) by mouth daily before breakfast. ON EMPTY STOMACH WITH A SMALL GLASS OF WATER, NO MEDS OR FOODS FOR 20 MIN 90 tablet 3  . montelukast (SINGULAIR) 10 MG tablet Take 1 tablet (10 mg total) by mouth every morning. 90 tablet 3  . Multiple Vitamin (MULTIVITAMIN) tablet Take 1 tablet by mouth daily.    . ondansetron (ZOFRAN) 8 MG tablet Take 8 mg by mouth every 4 (four) hours as needed.    . pregabalin (LYRICA) 150 MG capsule TAKE 1 CAPSULE (150 MG TOTAL) BY MOUTH 3 (THREE) TIMES DAILY. 90 capsule 2  . SUMAtriptan (IMITREX) 100 MG tablet TAKE 1 TAB BY MOUTH EVERY 2 HOURS AS NEEDED FOR MIGRAINE. NO MORE THAN 2 DOSES IN 24 HOURS. 8 tablet 3  . telmisartan (MICARDIS) 80 MG tablet Take 80 mg by mouth daily.    Marland Kitchen telmisartan-hydrochlorothiazide (MICARDIS HCT) 80-25 MG tablet Take 1 tablet by mouth daily. 90 tablet 3  . traMADol (ULTRAM) 50 MG tablet TAKE 1 TABLET 4 TIMES A DAY 120 tablet 2  . vitamin C (ASCORBIC ACID) 500 MG tablet Take 1,000 mg by mouth daily.    . vitamin E 400 UNIT capsule Take 400 Units by mouth daily.     No current facility-administered medications for this visit.    Allergies as of  04/29/2020 - Review Complete 04/29/2020  Allergen Reaction Noted  . Soap  03/28/2014  . Sulfa antibiotics Nausea And Vomiting 12/18/2011    Family History  Problem Relation Age of Onset  . COPD Mother   . Fibromyalgia Mother   . Heart disease Father   . Colon polyps Father   . Stroke Sister   . Fibromyalgia Sister   . Colon polyps Maternal Grandmother   . Lung disease Paternal Grandmother   . Stroke Paternal Grandmother   . Diabetes Paternal Grandfather   . Heart disease Paternal Grandfather   . Diabetes Maternal Uncle   .  Colon polyps Maternal Aunt   . Aortic aneurysm Maternal Aunt   . Cystic kidney disease Maternal Aunt        kidney cyst  . Kidney disease Maternal Aunt   . Fibromyalgia Other        cousin  . Colon cancer Neg Hx     Social History   Socioeconomic History  . Marital status: Divorced    Spouse name: Not on file  . Number of children: 0  . Years of education: Not on file  . Highest education level: Not on file  Occupational History  . Occupation: Hospital doctor: [employer]  Tobacco Use  . Smoking status: Never Smoker  . Smokeless tobacco: Never Used  Substance and Sexual Activity  . Alcohol use: No    Alcohol/week: 0.0 standard drinks  . Drug use: No  . Sexual activity: Not on file  Other Topics Concern  . Not on file  Social History Narrative   Married. Education: [employer].    Social Determinants of Health   Financial Resource Strain:   . Difficulty of Paying Living Expenses:   Food Insecurity:   . Worried About Programme researcher, broadcasting/film/video in the Last Year:   . Barista in the Last Year:   Transportation Needs:   . Freight forwarder (Medical):   Marland Kitchen Lack of Transportation (Non-Medical):   Physical Activity:   . Days of Exercise per Week:   . Minutes of Exercise per Session:   Stress:   . Feeling of Stress :   Social Connections:   . Frequency of Communication with Friends and Family:   . Frequency of Social  Gatherings with Friends and Family:   . Attends Religious Services:   . Active Member of Clubs or Organizations:   . Attends Banker Meetings:   Marland Kitchen Marital Status:   Intimate Partner Violence:   . Fear of Current or Ex-Partner:   . Emotionally Abused:   Marland Kitchen Physically Abused:   . Sexually Abused:      Physical Exam: BP (!) 164/110 (BP Location: Left Arm, Patient Position: Sitting, Cuff Size: Large)   Pulse 96   Ht 5' 2.6" (1.59 m)   Wt 274 lb 4 oz (124.4 kg)   BMI 49.20 kg/m  Constitutional: generally well-appearing Psychiatric: alert and oriented x3 Abdomen: soft, nontender, nondistended, no obvious ascites, no peritoneal signs, normal bowel sounds No peripheral edema noted in lower extremities  Assessment and plan: 54 y.o. female with morbid obesity, intermittent abdominal pains, intermittent mild acute pancreatitis  First I again recommended that she consider sitting down with a surgeon to consider elective cholecystectomy for intermittent recurrent acute pancreatitis.  She is a bit reluctant to go through with even getting a Careers adviser.  She wants to talk with her primary care physician about that idea first.  I did explain to her that it is a fairly standard recommendation given that gallstones are such a common cause of pancreatitis.  I offered colonoscopy for colon cancer screening as well and she is not interested in that.  Please see the "Patient Instructions" section for addition details about the plan.  Rob Bunting, MD [facility] 04/29/2020, 1:51 PM   Total time on date of encounter was (this included time spent preparing to see the patient reviewing records; obtaining and/or reviewing separately obtained history; performing a medically appropriate exam and/or evaluation; counseling and educating the patient and family if present; ordering medications,  tests or procedures if applicable; and documenting clinical information in the  health record).

## 2020-06-18 ENCOUNTER — Encounter: Payer: Self-pay | Admitting: Neurology

## 2020-06-18 ENCOUNTER — Institutional Professional Consult (permissible substitution): Payer: 59 | Admitting: Neurology

## 2021-07-09 ENCOUNTER — Ambulatory Visit
Admission: EM | Admit: 2021-07-09 | Discharge: 2021-07-09 | Disposition: A | Discharge status: Home / Self Care | Payer: 59

## 2021-07-09 DIAGNOSIS — M797 Fibromyalgia: Secondary | ICD-10-CM | POA: Diagnosis not present

## 2021-07-09 DIAGNOSIS — M7918 Myalgia, other site: Secondary | ICD-10-CM | POA: Diagnosis not present

## 2021-07-09 NOTE — ED Provider Notes (Signed)
[facility]    CSN: [id number] Arrival date & time: 07/09/21  1311      History   Chief Complaint Chief Complaint  Patient presents with   generalized pain    HPI Gloria Carter is a 55 y.o. female history of hypertension, IBS, fibromyalgia presenting today for follow-up of fibromyalgia flare.  Reports over the past 5 to 6 days has had increased body aches especially in her arms legs and hips, feels similar to prior prior fibromyalgia flares.  Denies specific injury or trauma or fall.  Does report increased activity helping mother and doing more activities around the house.  Uses Lyrica, Soma and tramadol.  Symptoms have improved, has missed work due to pain and needing work note to return.  HPI  Past Medical History:  Diagnosis Date   ADD (attention deficit disorder)    Allergy    ENVIRONMENTAL   Arthritis    knees   Asthma    Depression    Difficulty sleeping    Dysphagia    Fibromyalgia    GERD (gastroesophageal reflux disease)    Hernia of abdominal wall    Hyperlipidemia    Hypertension    IBS (irritable bowel syndrome)    Migraine    Numbness and tingling in hands    Obesity    Swelling of both ankles    Thyroid disease     Patient Active Problem List   Diagnosis Date Noted   Metabolic syndrome 05/18/2017   Acute pancreatitis without infection or necrosis 04/26/2017   Vitamin D deficiency 06/09/2015   Degeneration of lumbar or lumbosacral intervertebral disc 05/22/2015   Right lumbar radiculopathy 05/22/2015   Prediabetes 09/08/2013   Hypothyroidism 08/31/2013   Asthma, chronic 08/31/2013   Upper airway cough syndrome 08/31/2013   Carpal tunnel syndrome 06/01/2013   Wrist tendonitis 06/01/2013   Encounter for medication monitoring 12/01/2012   HTN (hypertension) 08/13/2012   BMI 45.0-49.9, adult (HCC) 08/13/2012   Hyperlipidemia 08/13/2012   IBS (irritable bowel syndrome) 08/13/2012   ADD (attention deficit disorder) 02/07/2012    Migraine 02/07/2012   Dysphagia 02/07/2012   Hernia of abdominal wall 02/07/2012   RAD (reactive airway disease) 12/20/2011   Fibromyalgia 12/20/2011   Chronic pain 12/20/2011    Past Surgical History:  Procedure Laterality Date   INSERTION OF MESH N/A 03/28/2014   Procedure: INSERTION OF MESH;  Surgeon: Velora Heckler, MD;  Location: [facility];  Service: General;  Laterality: N/A;   TONSILECTOMY/ADENOIDECTOMY WITH MYRINGOTOMY     VENTRAL HERNIA REPAIR N/A 03/28/2014   Procedure: LAPAROSCOPIC VENTRAL HERNIA REPAIR WITH MESH;  Surgeon: Velora Heckler, MD;  Location: [facility];  Service: General;  Laterality: N/A;   WISDOM TOOTH EXTRACTION      OB History   No obstetric history on file.      Home Medications    Prior to Admission medications   Medication Sig Start Date End Date Taking? Authorizing Provider  albuterol (VENTOLIN HFA) 108 (90 Base) MCG/ACT inhaler TAKE 2 PUFFS BY MOUTH EVERY 6 HOURS AS NEEDED FOR WHEEZE OR SHORTNESS OF BREATH 03/10/19   Collie Siad A, MD  amphetamine-dextroamphetamine (ADDERALL) 10 MG tablet Take 1 tablet (10 mg total) by mouth 2 (two) times daily with a meal. 03/28/19   Stallings, Zoe A, MD  azelastine (OPTIVAR) 0.05 % ophthalmic solution PLACE TWO DROPS INTO BOTH EYES TWICE DAILY FOR DRY EYES 12/26/18   Doristine Bosworth, MD  b complex vitamins tablet Take 1  tablet by mouth daily.    [provider]  Biotin 1000 MCG CHEW Chew 5,000 mcg by mouth.    [provider]  budesonide-formoterol (SYMBICORT) 160-4.5 MCG/ACT inhaler Inhale 2 puffs into the lungs 2 (two) times daily. 04/11/18   Sherren Mocha, MD  Calcium-Magnesium-Zinc [phone] MG TABS Take by mouth.    [provider]  carisoprodol (SOMA) 350 MG tablet TAKE 1 TABLET (350 MG TOTAL) BY MOUTH 4 (FOUR) TIMES DAILY AS NEEDED FOR MUSCLE SPASMS. 03/01/19   Doristine Bosworth, MD  cetirizine (ZYRTEC) 10 MG tablet Take 10 mg by mouth daily as needed for allergies.     [provider]   cholecalciferol (VITAMIN D) 400 UNITS TABS Take 400 Units by mouth daily.    [provider]  citalopram (CELEXA) 20 MG tablet TAKE 1 TABLET BY MOUTH EVERY DAY 01/02/19   Collie Siad A, MD  CREON 36000 units CPEP capsule TAKE 1 CAPSULES BY MOUTH AS DIRECTED 4 CAPS WITH A MEAL AND 1 CAP WITH SNACKS 06/26/18   Unk Lightning, PA  esomeprazole (NEXIUM 24HR) 20 MG capsule Take 40 mg by mouth daily at 12 noon.    [provider]  HYDROcodone-acetaminophen (NORCO/VICODIN) 5-325 MG tablet Take 1 tablet by mouth every 4 (four) hours as needed. 04/16/20   [provider]  ipratropium (ATROVENT) 0.03 % nasal spray Place 2 sprays into the nose 4 (four) times daily as needed for rhinitis. 01/05/19   Doristine Bosworth, MD  levothyroxine (SYNTHROID) 75 MCG tablet Take 1 tablet (75 mcg total) by mouth daily before breakfast. ON EMPTY STOMACH WITH A SMALL GLASS OF WATER, NO MEDS OR FOODS FOR 20 MIN 09/26/18   Sherren Mocha, MD  montelukast (SINGULAIR) 10 MG tablet Take 1 tablet (10 mg total) by mouth every morning. 09/26/18   Sherren Mocha, MD  Multiple Vitamin (MULTIVITAMIN) tablet Take 1 tablet by mouth daily.    [provider]  ondansetron (ZOFRAN) 8 MG tablet Take 8 mg by mouth every 4 (four) hours as needed. 04/09/20   [provider]  pregabalin (LYRICA) 150 MG capsule TAKE 1 CAPSULE (150 MG TOTAL) BY MOUTH 3 (THREE) TIMES DAILY. 09/26/18   Sherren Mocha, MD  SUMAtriptan (IMITREX) 100 MG tablet TAKE 1 TAB BY MOUTH EVERY 2 HOURS AS NEEDED FOR MIGRAINE. NO MORE THAN 2 DOSES IN 24 HOURS. 11/15/18   Sherren Mocha, MD  telmisartan (MICARDIS) 80 MG tablet Take 80 mg by mouth daily. 04/17/20   [provider]  telmisartan-hydrochlorothiazide (MICARDIS HCT) 80-25 MG tablet Take 1 tablet by mouth daily. 09/26/18   Sherren Mocha, MD  traMADol (ULTRAM) 50 MG tablet TAKE 1 TABLET 4 TIMES A DAY 03/01/19   Collie Siad A, MD  vitamin C (ASCORBIC ACID) 500 MG tablet Take 1,000  mg by mouth daily.    [provider]  vitamin E 400 UNIT capsule Take 400 Units by mouth daily.    [provider]    Family History Family History  Problem Relation Age of Onset   COPD Mother    Fibromyalgia Mother    Heart disease Father    Colon polyps Father    Stroke Sister    Fibromyalgia Sister    Colon polyps Maternal Grandmother    Lung disease Paternal Grandmother    Stroke Paternal Grandmother    Diabetes Paternal Grandfather    Heart disease Paternal Grandfather    Diabetes Maternal Uncle  Colon polyps Maternal Aunt    Aortic aneurysm Maternal Aunt    Cystic kidney disease Maternal Aunt        kidney cyst   Kidney disease Maternal Aunt    Fibromyalgia Other        cousin   Colon cancer Neg Hx     Social History Social History   Tobacco Use   Smoking status: Never   Smokeless tobacco: Never  Substance Use Topics   Alcohol use: No    Alcohol/week: 0.0 standard drinks   Drug use: No     Allergies   Soap and Sulfa antibiotics   Review of Systems Review of Systems  Constitutional:  Negative for fatigue and fever.  HENT:  Negative for mouth sores.   Eyes:  Negative for visual disturbance.  Respiratory:  Negative for shortness of breath.   Cardiovascular:  Negative for chest pain.  Gastrointestinal:  Negative for abdominal pain, nausea and vomiting.  Genitourinary:  Negative for genital sores.  Musculoskeletal:  Positive for arthralgias and myalgias. Negative for joint swelling.  Skin:  Negative for color change, rash and wound.  Neurological:  Negative for dizziness, weakness, light-headedness and headaches.    Physical Exam Triage Vital Signs ED Triage Vitals  Enc Vitals Group     BP      Pulse      Resp      Temp      Temp src      SpO2      Weight      Height      Head Circumference      Peak Flow      Pain Score      Pain Loc      Pain Edu?      Excl. in GC?    No data found.  Updated Vital Signs BP (!)  150/96 (BP Location: Right Arm)   Pulse 97   Temp 97.8 F (36.6 C) (Oral)   Resp 20   SpO2 97%   Visual Acuity Right Eye Distance:   Left Eye Distance:   Bilateral Distance:    Right Eye Near:   Left Eye Near:    Bilateral Near:     Physical Exam Vitals and nursing note reviewed.  Constitutional:      Appearance: She is well-developed.     Comments: No acute distress  HENT:     Head: Normocephalic and atraumatic.     Nose: Nose normal.  Eyes:     Conjunctiva/sclera: Conjunctivae normal.  Cardiovascular:     Rate and Rhythm: Normal rate.  Pulmonary:     Effort: Pulmonary effort is normal. No respiratory distress.  Abdominal:     General: There is no distension.  Musculoskeletal:        General: Normal range of motion.     Cervical back: Neck supple.     Comments: Ambulates with walker  Skin:    General: Skin is warm and dry.  Neurological:     Mental Status: She is alert and oriented to person, place, and time.     UC Treatments / Results  Labs (all labs ordered are listed, but only abnormal results are displayed) Labs Reviewed - No data to display  EKG   Radiology No results found.  Procedures Procedures (including critical care time)  Medications Ordered in UC Medications - No data to display  Initial Impression / Assessment and Plan / UC Course  I have reviewed the triage  vital signs and the nursing notes.  Pertinent labs & imaging results that were available during my care of the patient were reviewed by me and considered in my medical decision making (see chart for details).     Fibromyalgia flare, improving, but not fully resolved, continue with meds prescribed by PCP, work note provided to return to work.  Discussed strict return precautions. Patient verbalized understanding and is agreeable with plan.  Final Clinical Impressions(s) / UC Diagnoses   Final diagnoses:  Fibromyalgia  Musculoskeletal pain   Discharge Instructions    None    ED Prescriptions   None    PDMP not reviewed this encounter.   Lew Dawes, [place] 07/09/21 1411

## 2021-07-09 NOTE — ED Triage Notes (Signed)
Pt reports having fibromyalgia flare up that started about (7 days ago). Pt states pain level is somewhat better.

## 2021-09-30 ENCOUNTER — Other Ambulatory Visit: Payer: Self-pay

## 2021-09-30 ENCOUNTER — Ambulatory Visit
Admission: EM | Admit: 2021-09-30 | Discharge: 2021-09-30 | Disposition: A | Discharge status: Home / Self Care | Payer: 59

## 2021-09-30 DIAGNOSIS — R197 Diarrhea, unspecified: Secondary | ICD-10-CM | POA: Diagnosis not present

## 2021-09-30 DIAGNOSIS — Z8719 Personal history of other diseases of the digestive system: Secondary | ICD-10-CM

## 2021-09-30 DIAGNOSIS — R112 Nausea with vomiting, unspecified: Secondary | ICD-10-CM

## 2021-09-30 NOTE — ED Triage Notes (Signed)
Pt reports having a flare up of pancreatitis and is needing a return to work note.

## 2021-09-30 NOTE — Discharge Instructions (Addendum)
Thank you for visiting urgent care today.  I am happy to provide you with a note to return to work.  Have a happy Christmas.

## 2021-09-30 NOTE — ED Provider Notes (Signed)
[facility]    CSN: [id number] Arrival date & time: 09/30/21  1455    HISTORY  No chief complaint on file.  HPI Gloria Carter is a 55 y.o. female. Pt reports having a flare up of pancreatitis and is needing a return to work note so that she can go back tomorrow.  Patient states that for the past 10 days she has been having some nausea, vomiting, diarrhea similar to a previous episode of pancreatitis.  EMR reviewed, patient has pancreatitis diagnosis confirmed on CT scan about a year ago.  Patient states that her symptoms have largely resolved at this point and that she is feeling well.  Patient does have a slightly elevated temperature on arrival today and has decreased O2 sats at 93%.  The history is provided by the patient.  Past Medical History:  Diagnosis Date   ADD (attention deficit disorder)    Allergy    ENVIRONMENTAL   Arthritis    knees   Asthma    Depression    Difficulty sleeping    Dysphagia    Fibromyalgia    GERD (gastroesophageal reflux disease)    Hernia of abdominal wall    Hyperlipidemia    Hypertension    IBS (irritable bowel syndrome)    Migraine    Numbness and tingling in hands    Obesity    Swelling of both ankles    Thyroid disease    Patient Active Problem List   Diagnosis Date Noted   Metabolic syndrome 05/18/2017   Acute pancreatitis without infection or necrosis 04/26/2017   Vitamin D deficiency 06/09/2015   Degeneration of lumbar or lumbosacral intervertebral disc 05/22/2015   Right lumbar radiculopathy 05/22/2015   Prediabetes 09/08/2013   Hypothyroidism 08/31/2013   Asthma, chronic 08/31/2013   Upper airway cough syndrome 08/31/2013   Carpal tunnel syndrome 06/01/2013   Wrist tendonitis 06/01/2013   Encounter for medication monitoring 12/01/2012   HTN (hypertension) 08/13/2012   BMI 45.0-49.9, adult (HCC) 08/13/2012   Hyperlipidemia 08/13/2012   IBS (irritable bowel syndrome) 08/13/2012   ADD (attention deficit  disorder) 02/07/2012   Migraine 02/07/2012   Dysphagia 02/07/2012   Hernia of abdominal wall 02/07/2012   RAD (reactive airway disease) 12/20/2011   Fibromyalgia 12/20/2011   Chronic pain 12/20/2011   Past Surgical History:  Procedure Laterality Date   INSERTION OF MESH N/A 03/28/2014   Procedure: INSERTION OF MESH;  Surgeon: Velora Heckler, MD;  Location: [facility];  Service: General;  Laterality: N/A;   TONSILECTOMY/ADENOIDECTOMY WITH MYRINGOTOMY     VENTRAL HERNIA REPAIR N/A 03/28/2014   Procedure: LAPAROSCOPIC VENTRAL HERNIA REPAIR WITH MESH;  Surgeon: Velora Heckler, MD;  Location: [facility];  Service: General;  Laterality: N/A;   WISDOM TOOTH EXTRACTION     OB History   No obstetric history on file.    Home Medications    Prior to Admission medications   Medication Sig Start Date End Date Taking? Authorizing Provider  albuterol (VENTOLIN HFA) 108 (90 Base) MCG/ACT inhaler TAKE 2 PUFFS BY MOUTH EVERY 6 HOURS AS NEEDED FOR WHEEZE OR SHORTNESS OF BREATH 03/10/19   Collie Siad A, MD  amphetamine-dextroamphetamine (ADDERALL) 10 MG tablet Take 1 tablet (10 mg total) by mouth 2 (two) times daily with a meal. 03/28/19   Stallings, Zoe A, MD  azelastine (OPTIVAR) 0.05 % ophthalmic solution PLACE TWO DROPS INTO BOTH EYES TWICE DAILY FOR DRY EYES 12/26/18   Doristine Bosworth, MD  b complex vitamins  tablet Take 1 tablet by mouth daily.    [provider]  Biotin 1000 MCG CHEW Chew 5,000 mcg by mouth.    [provider]  budesonide-formoterol (SYMBICORT) 160-4.5 MCG/ACT inhaler Inhale 2 puffs into the lungs 2 (two) times daily. 04/11/18   Shawnee Knapp, MD  Calcium-Magnesium-Zinc [phone] MG TABS Take by mouth.    [provider]  carisoprodol (SOMA) 350 MG tablet TAKE 1 TABLET (350 MG TOTAL) BY MOUTH 4 (FOUR) TIMES DAILY AS NEEDED FOR MUSCLE SPASMS. 03/01/19   Forrest Moron, MD  cetirizine (ZYRTEC) 10 MG tablet Take 10 mg by mouth daily as needed for allergies.     [provider]  cholecalciferol (VITAMIN D) 400 UNITS TABS Take 400 Units by mouth daily.    [provider]  citalopram (CELEXA) 20 MG tablet TAKE 1 TABLET BY MOUTH EVERY DAY 01/02/19   Delia Chimes A, MD  CREON 36000 units CPEP capsule TAKE 1 CAPSULES BY MOUTH AS DIRECTED 4 CAPS WITH A MEAL AND 1 CAP WITH SNACKS 06/26/18   Levin Erp, PA  esomeprazole (NEXIUM 24HR) 20 MG capsule Take 40 mg by mouth daily at 12 noon.    [provider]  HYDROcodone-acetaminophen (NORCO/VICODIN) 5-325 MG tablet Take 1 tablet by mouth every 4 (four) hours as needed. 04/16/20   [provider]  ipratropium (ATROVENT) 0.03 % nasal spray Place 2 sprays into the nose 4 (four) times daily as needed for rhinitis. 01/05/19   Forrest Moron, MD  levothyroxine (SYNTHROID) 75 MCG tablet Take 1 tablet (75 mcg total) by mouth daily before breakfast. ON EMPTY STOMACH WITH A SMALL GLASS OF WATER, NO MEDS OR FOODS FOR 20 MIN 09/26/18   Shawnee Knapp, MD  montelukast (SINGULAIR) 10 MG tablet Take 1 tablet (10 mg total) by mouth every morning. 09/26/18   Shawnee Knapp, MD  Multiple Vitamin (MULTIVITAMIN) tablet Take 1 tablet by mouth daily.    [provider]  ondansetron (ZOFRAN) 8 MG tablet Take 8 mg by mouth every 4 (four) hours as needed. 04/09/20   [provider]  pregabalin (LYRICA) 150 MG capsule TAKE 1 CAPSULE (150 MG TOTAL) BY MOUTH 3 (THREE) TIMES DAILY. 09/26/18   Shawnee Knapp, MD  SUMAtriptan (IMITREX) 100 MG tablet TAKE 1 TAB BY MOUTH EVERY 2 HOURS AS NEEDED FOR MIGRAINE. NO MORE THAN 2 DOSES IN 24 HOURS. 11/15/18   Shawnee Knapp, MD  telmisartan (MICARDIS) 80 MG tablet Take 80 mg by mouth daily. 04/17/20   [provider]  telmisartan-hydrochlorothiazide (MICARDIS HCT) 80-25 MG tablet Take 1 tablet by mouth daily. 09/26/18   Shawnee Knapp, MD  traMADol (ULTRAM) 50 MG tablet TAKE 1 TABLET 4 TIMES A DAY 03/01/19   Delia Chimes A, MD  vitamin C (ASCORBIC ACID) 500 MG  tablet Take 1,000 mg by mouth daily.    [provider]  vitamin E 400 UNIT capsule Take 400 Units by mouth daily.    [provider]    Family History Family History  Problem Relation Age of Onset   COPD Mother    Fibromyalgia Mother    Heart disease Father    Colon polyps Father    Stroke Sister    Fibromyalgia Sister    Colon polyps Maternal Grandmother    Lung disease Paternal Grandmother    Stroke Paternal Grandmother    Diabetes Paternal Grandfather    Heart disease Paternal Grandfather  Diabetes Maternal Uncle    Colon polyps Maternal Aunt    Aortic aneurysm Maternal Aunt    Cystic kidney disease Maternal Aunt        kidney cyst   Kidney disease Maternal Aunt    Fibromyalgia Other        cousin   Colon cancer Neg Hx    Social History Social History   Tobacco Use   Smoking status: Never   Smokeless tobacco: Never  Substance Use Topics   Alcohol use: No    Alcohol/week: 0.0 standard drinks   Drug use: No   Allergies   Soap and Sulfa antibiotics  Review of Systems Review of Systems Pertinent findings noted in history of present illness.   Physical Exam Triage Vital Signs ED Triage Vitals  Enc Vitals Group     BP 08/07/21 0827 (!) 147/82     Pulse Rate 08/07/21 0827 72     Resp 08/07/21 0827 18     Temp 08/07/21 0827 98.3 F (36.8 C)     Temp Source 08/07/21 0827 Oral     SpO2 08/07/21 0827 98 %     Weight --      Height --      Head Circumference --      Peak Flow --      Pain Score 08/07/21 0826 5     Pain Loc --      Pain Edu? --      Excl. in [place]? --   No data found.  Updated Vital Signs BP (!) 150/89 (BP Location: Right Arm)    Pulse 82    Temp 99.2 F (37.3 C) (Oral)    Resp 20    SpO2 93%   Physical Exam Vitals and nursing note reviewed.  Constitutional:      General: She is not in acute distress.    Appearance: She is not ill-appearing.  HENT:     Head: Normocephalic and atraumatic.     Right Ear: Tympanic  membrane and external ear normal.     Left Ear: Tympanic membrane and external ear normal.     Nose: Nose normal.     Mouth/Throat:     Mouth: Mucous membranes are moist.     Pharynx: Oropharynx is clear.  Eyes:     General: No scleral icterus.       Right eye: No discharge.        Left eye: No discharge.     Extraocular Movements: Extraocular movements intact.     Conjunctiva/sclera: Conjunctivae normal.     Pupils: Pupils are equal, round, and reactive to light.  Neck:     Vascular: No JVD.  Cardiovascular:     Rate and Rhythm: Normal rate and regular rhythm.     Pulses: Normal pulses.          Radial pulses are 2+ on the right side and 2+ on the left side.     Heart sounds: Normal heart sounds.  Pulmonary:     Comments: Lungs clear to auscultation in all fields. Symmetric chest rise. No wheezing, rales, or rhonchi. Abdominal:     General: Bowel sounds are normal.     Tenderness: There is no right CVA tenderness or left CVA tenderness.     Hernia: No hernia is present.     Comments: Abdomen is soft, non-distended.  Tender to palpation of epigastric area and left upper quadrant.  No rigidity, no guarding. No peritoneal signs.  Musculoskeletal:  General: Normal range of motion.     Cervical back: Normal range of motion.  Skin:    General: Skin is warm and dry.     Capillary Refill: Capillary refill takes less than 2 seconds.  Neurological:     Mental Status: She is oriented to person, place, and time.     GCS: GCS eye subscore is 4. GCS verbal subscore is 5. GCS motor subscore is 6.     Comments: Fluent speech, no facial droop.  Psychiatric:        Behavior: Behavior normal.    Visual Acuity Right Eye Distance:   Left Eye Distance:   Bilateral Distance:    Right Eye Near:   Left Eye Near:    Bilateral Near:     UC Couse / Diagnostics / Procedures:    EKG  Radiology No results found.  Procedures Procedures (including critical care time)  UC Diagnoses /  Final Clinical Impressions(s)   I have reviewed the triage vital signs and the nursing notes.  Pertinent labs & imaging results that were available during my care of the patient were reviewed by me and considered in my medical decision making (see chart for details).    Final diagnoses:  History of pancreatitis  Nausea and vomiting, unspecified vomiting type  Diarrhea, unspecified type   Physical exam today is unremarkable.  Patient advised that she appears well and that I am happy to provide her with a note to return to work.  Return precautions advised.  ED Prescriptions   None    PDMP not reviewed this encounter.  Pending results:  Labs Reviewed - No data to display  Medications Ordered in UC: Medications - No data to display  Disposition Upon Discharge:  Condition: stable for discharge home Home: take medications as prescribed; routine discharge instructions as discussed; follow up as advised.  Patient presented with an acute illness with associated systemic symptoms and significant discomfort requiring urgent management. In my opinion, this is a condition that a prudent lay person (someone who possesses an average knowledge of health and medicine) may potentially expect to result in complications if not addressed urgently such as respiratory distress, impairment of bodily function or dysfunction of bodily organs.   Routine symptom specific, illness specific and/or disease specific instructions were discussed with the patient and/or caregiver at length.   As such, the patient has been evaluated and assessed, work-up was performed and treatment was provided in alignment with urgent care protocols and evidence based medicine.  Patient/parent/caregiver has been advised that the patient may require follow up for further testing and treatment if the symptoms continue in spite of treatment, as clinically indicated and appropriate.  If the patient was tested for COVID-19, Influenza  and/or RSV, then the patient/parent/guardian was advised to isolate at home pending the results of his/her diagnostic coronavirus test and potentially longer if theyre positive. I have also advised pt that if his/her COVID-19 test returns positive, it's recommended to self-isolate for at least 10 days after symptoms first appeared AND until fever-free for 24 hours without fever reducer AND other symptoms have improved or resolved. Discussed self-isolation recommendations as well as instructions for household member/close contacts as per the [facility] and  [facility], and also gave patient the [place] packet with this information.  Patient/parent/caregiver has been advised to return to the [facility] or PCP in 3-5 days if no better; to PCP or the Emergency Department if new signs and symptoms develop, or if the current signs  or symptoms continue to change or worsen for further workup, evaluation and treatment as clinically indicated and appropriate  The patient will follow up with their current PCP if and as advised. If the patient does not currently have a PCP we will assist them in obtaining one.   The patient may need specialty follow up if the symptoms continue, in spite of conservative treatment and management, for further workup, evaluation, consultation and treatment as clinically indicated and appropriate.   Patient/parent/caregiver verbalized understanding and agreement of plan as discussed.  All questions were addressed during visit.  Please see discharge instructions below for further details of plan.  Discharge Instructions:   Discharge Instructions      Thank you for visiting urgent care today.  I am happy to provide you with a note to return to work.  Have a happy Christmas.      This office note has been dictated using Museum/gallery curator.  Unfortunately, and despite my best efforts, this method of dictation can sometimes lead to occasional typographical or grammatical errors.  I  apologize in advance if this occurs.     Lynden Oxford Scales, [place] 09/30/21 [phone]

## 2022-01-05 ENCOUNTER — Ambulatory Visit: Payer: 59 | Admitting: Family Medicine

## 2022-01-05 ENCOUNTER — Encounter: Payer: Self-pay | Admitting: Family Medicine

## 2022-01-05 VITALS — BP 148/77 | HR 86 | Ht 62.6 in | Wt 280.8 lb

## 2022-01-05 DIAGNOSIS — M797 Fibromyalgia: Secondary | ICD-10-CM | POA: Diagnosis not present

## 2022-01-05 DIAGNOSIS — G894 Chronic pain syndrome: Secondary | ICD-10-CM

## 2022-01-05 DIAGNOSIS — Z6841 Body Mass Index (BMI) 40.0 and over, adult: Secondary | ICD-10-CM

## 2022-01-05 DIAGNOSIS — I10 Essential (primary) hypertension: Secondary | ICD-10-CM | POA: Diagnosis not present

## 2022-01-05 DIAGNOSIS — R5383 Other fatigue: Secondary | ICD-10-CM | POA: Diagnosis not present

## 2022-01-05 LAB — COMPREHENSIVE METABOLIC PANEL
ALT: 12 U/L (ref 0–35)
AST: 15 U/L (ref 0–37)
Albumin: 4.1 g/dL (ref 3.5–5.2)
Alkaline Phosphatase: 72 U/L (ref 39–117)
BUN: 13 mg/dL (ref 6–23)
CO2: 32 mEq/L (ref 19–32)
Calcium: 9.2 mg/dL (ref 8.4–10.5)
Chloride: 100 mEq/L (ref 96–112)
Creatinine, Ser: 0.77 mg/dL (ref 0.40–1.20)
GFR: 85.26 mL/min (ref >60.00)
Glucose, Bld: 98 mg/dL (ref 70–99)
Potassium: 3.4 mEq/L — ABNORMAL LOW (ref 3.5–5.1)
Sodium: 140 mEq/L (ref 135–145)
Total Bilirubin: 0.3 mg/dL (ref 0.2–1.2)
Total Protein: 6.8 g/dL (ref 6.0–8.3)

## 2022-01-05 LAB — CBC
HCT: 38.1 % (ref 36.0–46.0)
Hemoglobin: 12.8 g/dL (ref 12.0–15.0)
MCHC: 33.7 g/dL (ref 30.0–36.0)
MCV: 89.8 fl (ref 78.0–100.0)
Platelets: 174 10*3/uL (ref 150.0–400.0)
RBC: 4.25 Mil/uL (ref 3.87–5.11)
RDW: 14.6 % (ref 11.5–15.5)
WBC: 7.7 10*3/uL (ref 4.0–10.5)

## 2022-01-05 LAB — TSH: TSH: 1 u[IU]/mL (ref 0.35–5.50)

## 2022-01-05 LAB — HEMOGLOBIN A1C: Hgb A1c MFr Bld: 6 % (ref 4.6–6.5)

## 2022-01-05 LAB — LIPID PANEL
Cholesterol: 236 mg/dL — ABNORMAL HIGH (ref 0–200)
HDL: 43.3 mg/dL (ref >39.00)
Total CHOL/HDL Ratio: 5
Triglycerides: 405 mg/dL — ABNORMAL HIGH (ref 0.0–149.0)

## 2022-01-05 LAB — LDL CHOLESTEROL, DIRECT: Direct LDL: 147 mg/dL

## 2022-01-05 MED ORDER — TELMISARTAN-HCTZ 80-25 MG PO TABS: 1.0000 | ORAL_TABLET | Freq: Every day | ORAL | 3 refills | Status: DC

## 2022-01-05 NOTE — Progress Notes (Signed)
? ?______________________________________________________________________ ? ?HPI ?Gloria Carter is a 56 y.o. female presenting to [place] at [facility] today to establish care. Previous provider left her practice.  ? ?Patient Care Team: ?Terrilyn Saver, NP as PCP - General (Family Medicine) ?Luellen Pucker, MD (Gynecology) ?Bo Merino, MD (Rheumatology) ?Elsie Saas, MD as Consulting Physician (Orthopedic Surgery) ?Leeroy Cha, MD as Consulting Physician (Neurosurgery) ?Milus Banister, MD as Attending Physician (Gastroenterology) ? ?Health Maintenance  ?Topic Date Due  ? COVID-19 Vaccine (1) Never done  ? Cologuard (Stool DNA test)  Never done  ? Mammogram  Never done  ? Zoster (Shingles) Vaccine (1 of 2) Never done  ? Pap Smear  12/18/2014  ? Tetanus Vaccine  12/08/2019  ? Flu Shot  05/11/2021  ? Hepatitis C Screening: USPSTF Recommendation to screen - Ages 60-79 yo.  Completed  ? HIV Screening  Completed  ? HPV Vaccine  Aged Out  ? ? ? ?Concerns today: ?Fibromyalgia: Patient reports she has been having a flare for the past 3 to 4 weeks which has kept her out of work.  States she was originally diagnosed by rheumatology in 1999 but states that they did not want to continue following her with this.  She ended up seeing an orthopedic at [facility] who confirmed the diagnosis but she did not feel like driving all the way there to continue managing.  States prior PCP was giving her tramadol, Soma, Lyrica. ?Fatigue: Patient reports for slightly longer than the fibromyalgia flare has been going on she has been having extreme fatigue.  States she probably sleeps over 15 hours a day.  States she does not have any energy to do anything.  She does not think her mood is any different, denies any depression, SI/HI.  States she has always been a night owl and tends to sleep off and on throughout the day and just take naps at night.  ?Hypertension: Reports history of hypertension and  she has been off her blood pressure meds for the past few weeks as the pharmacy has not refilled this for her.  States she does not check regularly at home.  She denies any chest pain, palpitations, edema. ? ? ? ?Patient Active Problem List  ? Diagnosis Date Noted  ? Metabolic syndrome 47/82/9562  ? Acute pancreatitis without infection or necrosis 04/26/2017  ? Vitamin D deficiency 06/09/2015  ? Degeneration of lumbar or lumbosacral intervertebral disc 05/22/2015  ? Right lumbar radiculopathy 05/22/2015  ? Prediabetes 09/08/2013  ? Hypothyroidism 08/31/2013  ? Asthma, chronic 08/31/2013  ? Upper airway cough syndrome 08/31/2013  ? Carpal tunnel syndrome 06/01/2013  ? Wrist tendonitis 06/01/2013  ? Encounter for medication monitoring 12/01/2012  ? HTN (hypertension) 08/13/2012  ? BMI 45.0-49.9, adult ([place]) 08/13/2012  ? Hyperlipidemia 08/13/2012  ? IBS (irritable bowel syndrome) 08/13/2012  ? ADD (attention deficit disorder) 02/07/2012  ? Migraine 02/07/2012  ? Dysphagia 02/07/2012  ? Hernia of abdominal wall 02/07/2012  ? RAD (reactive airway disease) 12/20/2011  ? Fibromyalgia 12/20/2011  ? Chronic pain 12/20/2011  ? ? ? ?______________________________________________________________________ ?PMH ?Past Medical History:  ?Diagnosis Date  ? ADD (attention deficit disorder)   ? Allergy   ? ENVIRONMENTAL  ? Arthritis   ? knees  ? Asthma   ? Depression   ? Difficulty sleeping   ? Dysphagia   ? Fibromyalgia   ? GERD (gastroesophageal reflux disease)   ? Hernia of abdominal wall   ? Hyperlipidemia   ? Hypertension   ?  IBS (irritable bowel syndrome)   ? Migraine   ? Numbness and tingling in hands   ? Obesity   ? Swelling of both ankles   ? Thyroid disease   ? ? ?ROS ?All review of systems negative except what is listed in the HPI ? ?PHYSICAL EXAM ?Physical Exam ?Vitals reviewed.  ?Constitutional:   ?   Appearance: Normal appearance. She is obese.  ?HENT:  ?   Head: Normocephalic and atraumatic.  ?Cardiovascular:  ?   Rate  and Rhythm: Normal rate and regular rhythm.  ?Pulmonary:  ?   Effort: Pulmonary effort is normal.  ?   Breath sounds: Normal breath sounds.  ?Musculoskeletal:  ?   Cervical back: Normal range of motion and neck supple.  ?Skin: ?   General: Skin is warm and dry.  ?   Capillary Refill: Capillary refill takes less than 2 seconds.  ?Neurological:  ?   General: No focal deficit present.  ?   Mental Status: She is alert and oriented to person, place, and time. Mental status is at baseline.  ?Psychiatric:     ?   Mood and Affect: Mood normal.     ?   Behavior: Behavior normal.     ?   Thought Content: Thought content normal.     ?   Judgment: Judgment normal.  ? ?______________________________________________________________________ ?ASSESSMENT AND PLAN ? ?1. Fatigue, unspecified type ?Checking labs for potential reasons for fatigue. Discussed possibility of being on several sedating meds, but patient insisted this was not the cause. She also denied any depression. Will update her with results. ?- CBC ?- Comprehensive metabolic panel ?- Lipid panel ?- TSH ?- Hemoglobin A1c ?- Ambulatory referral to Pain Clinic ? ?2. Fibromyalgia ?3. Chronic pain syndrome ?Discussed with patient that she is on several medications that I do not feel comfortable prescribing together, long-term, especially given her complaint of fatigue/sleepiness. Placing a referral to pain management as she states rheumatology has told her they will not manage. Patient also requested note for when to return to work (she took herself out about 3 weeks ago), however states that she does not feel ready to return now and is not sure how long it will be. Politely informed patient that since I am unfamiliar with her and have only just met her, I cannot fill out paperwork for missed days, but would be happy to say she could return now. She declined. Patient was upset.  ?- Ambulatory referral to Pain Clinic ? ?4. Primary hypertension ?Sending in a new refill for  blood pressure medicine.  Please let us know if having trouble obtaining.  Continue to monitor BP at home and follow-up in 2 to 4 weeks with a nurse visit to see if improved once restarting meds.  ?- CBC ?- Comprehensive metabolic panel ?- Lipid panel ?- TSH ?- telmisartan-hydrochlorothiazide (MICARDIS HCT) 80-25 MG tablet; Take 1 tablet by mouth daily.  Dispense: 90 tablet; Refill: 3 ? ?5. Class 3 severe obesity with body mass index (BMI) of 50.0 to 59.9 in adult, unspecified obesity type, unspecified whether serious comorbidity present ([place]) ?Updating lab work today.  Recommend heart healthy, low carb diet with portion control.  Exercise as tolerated. ?- CBC ?- Comprehensive metabolic panel ?- Lipid panel ?- TSH ?- Hemoglobin A1c ? ? ?Establish care ?Education provided today during visit and on AVS for patient to review at home.  ?Diet and Exercise recommendations provided.  ?Current diagnoses and recommendations discussed. ?HM recommendations reviewed with recommendations.  ? ? ?  Outpatient Encounter Medications as of 01/05/2022  ?Medication Sig  ? albuterol (VENTOLIN HFA) 108 (90 Base) MCG/ACT inhaler TAKE 2 PUFFS BY MOUTH EVERY 6 HOURS AS NEEDED FOR WHEEZE OR SHORTNESS OF BREATH  ? amphetamine-dextroamphetamine (ADDERALL) 10 MG tablet Take 1 tablet (10 mg total) by mouth 2 (two) times daily with a meal.  ? azelastine (OPTIVAR) 0.05 % ophthalmic solution PLACE TWO DROPS INTO BOTH EYES TWICE DAILY FOR DRY EYES  ? b complex vitamins tablet Take 1 tablet by mouth daily.  ? Biotin 1000 MCG CHEW Chew 5,000 mcg by mouth.  ? budesonide-formoterol (SYMBICORT) 160-4.5 MCG/ACT inhaler Inhale 2 puffs into the lungs 2 (two) times daily.  ? Calcium-Magnesium-Zinc 208-02-2 MG TABS Take by mouth.  ? carisoprodol (SOMA) 350 MG tablet TAKE 1 TABLET (350 MG TOTAL) BY MOUTH 4 (FOUR) TIMES DAILY AS NEEDED FOR MUSCLE SPASMS.  ? cetirizine (ZYRTEC) 10 MG tablet Take 10 mg by mouth daily as needed for allergies.   ? cholecalciferol  (VITAMIN D) 400 UNITS TABS Take 400 Units by mouth daily.  ? citalopram (CELEXA) 20 MG tablet TAKE 1 TABLET BY MOUTH EVERY DAY  ? CREON 36000 units CPEP capsule TAKE 1 CAPSULES BY MOUTH AS DIRECTED 4 CAPS [place]

## 2022-01-05 NOTE — Patient Instructions (Signed)
Pain management referral for Fibromyalgia medications - I will not be writing for these prescriptions.  ?Checking labs to see for other reasons for your fatigue - you are on some strong medications, and I would recommend weaning.  ?Refilling your blood pressure medication - continue monitoring at home, low salt diet, exercise as tolerated. Nurse visit in a few weeks to repeat after you have been taking the medication for at least 2 weeks.  ?

## 2022-01-13 ENCOUNTER — Encounter: Payer: Self-pay | Admitting: *Deleted

## 2022-01-13 ENCOUNTER — Ambulatory Visit
Admission: RE | Admit: 2022-01-13 | Discharge: 2022-01-13 | Disposition: A | Discharge status: Home / Self Care | Payer: 59 | Source: Ambulatory Visit | Attending: Emergency Medicine | Admitting: Emergency Medicine

## 2022-01-13 VITALS — BP 155/100 | HR 93 | Temp 97.7°F | Resp 18

## 2022-01-13 DIAGNOSIS — R5383 Other fatigue: Secondary | ICD-10-CM | POA: Diagnosis not present

## 2022-01-13 DIAGNOSIS — Z560 Unemployment, unspecified: Secondary | ICD-10-CM

## 2022-01-13 DIAGNOSIS — R52 Pain, unspecified: Secondary | ICD-10-CM | POA: Diagnosis not present

## 2022-01-13 NOTE — ED Provider Notes (Signed)
?[facility] ? ? ? ?CSN: [id number] ?Arrival date & time: 01/13/22  1046 ?  ? ?HISTORY  ? ?Chief Complaint  ?Patient presents with  ? Nasal Congestion  ? Generalized Body Aches  ? ?HPI ?Gloria Carter is a 56 y.o. female. Pt reports she has been out of work sick and is needing clearance and work note.  Patient states she missed work because she is having fibromyalgia.  Patient advised that we do not perform return to work evaluations particularly when we have not seen the patient when they are ill.  Patient asked if her employer wants her to be screened for COVID and flu, patient states she is not sure but is agreeable to having this done. ? ?The history is provided by the patient.  ?Past Medical History:  ?Diagnosis Date  ? ADD (attention deficit disorder)   ? Allergy   ? ENVIRONMENTAL  ? Arthritis   ? knees  ? Asthma   ? Depression   ? Difficulty sleeping   ? Dysphagia   ? Fibromyalgia   ? GERD (gastroesophageal reflux disease)   ? Hernia of abdominal wall   ? Hyperlipidemia   ? Hypertension   ? IBS (irritable bowel syndrome)   ? Migraine   ? Numbness and tingling in hands   ? Obesity   ? Swelling of both ankles   ? Thyroid disease   ? ?Patient Active Problem List  ? Diagnosis Date Noted  ? Metabolic syndrome [id number]  ? Acute pancreatitis without infection or necrosis 04/26/2017  ? Vitamin D deficiency 06/09/2015  ? Degeneration of lumbar or lumbosacral intervertebral disc 05/22/2015  ? Right lumbar radiculopathy 05/22/2015  ? Prediabetes 09/08/2013  ? Hypothyroidism 08/31/2013  ? Asthma, chronic 08/31/2013  ? Upper airway cough syndrome 08/31/2013  ? Carpal tunnel syndrome 06/01/2013  ? Wrist tendonitis 06/01/2013  ? Encounter for medication monitoring 12/01/2012  ? HTN (hypertension) 08/13/2012  ? BMI 45.0-49.9, adult ([place]) 08/13/2012  ? Hyperlipidemia 08/13/2012  ? IBS (irritable bowel syndrome) 08/13/2012  ? ADD (attention deficit disorder) 02/07/2012  ? Migraine 02/07/2012  ? Dysphagia  02/07/2012  ? Hernia of abdominal wall 02/07/2012  ? RAD (reactive airway disease) 12/20/2011  ? Fibromyalgia 12/20/2011  ? Chronic pain 12/20/2011  ? ?Past Surgical History:  ?Procedure Laterality Date  ? INSERTION OF MESH N/A 03/28/2014  ? Procedure: INSERTION OF MESH;  Surgeon: Earnstine Regal, MD;  Location: [facility];  Service: General;  Laterality: N/A;  ? TONSILECTOMY/ADENOIDECTOMY WITH MYRINGOTOMY    ? VENTRAL HERNIA REPAIR N/A 03/28/2014  ? Procedure: LAPAROSCOPIC VENTRAL HERNIA REPAIR WITH MESH;  Surgeon: Earnstine Regal, MD;  Location: [facility];  Service: General;  Laterality: N/A;  ? WISDOM TOOTH EXTRACTION    ? ?OB History   ?No obstetric history on file. ?  ? ?Home Medications   ? ?Prior to Admission medications   ?Medication Sig Start Date End Date Taking? Authorizing Provider  ?albuterol (VENTOLIN HFA) 108 (90 Base) MCG/ACT inhaler TAKE 2 PUFFS BY MOUTH EVERY 6 HOURS AS NEEDED FOR WHEEZE OR SHORTNESS OF BREATH 03/10/19   Forrest Moron, MD  ?amphetamine-dextroamphetamine (ADDERALL) 10 MG tablet Take 1 tablet (10 mg total) by mouth 2 (two) times daily with a meal. 03/28/19   Stallings, Zoe A, MD  ?azelastine (OPTIVAR) 0.05 % ophthalmic solution PLACE TWO DROPS INTO BOTH EYES TWICE DAILY FOR DRY EYES 12/26/18   Delia Chimes A, MD  ?b complex vitamins tablet Take 1 tablet by mouth daily.  [provider]  ?Biotin 1000 MCG CHEW Chew 5,000 mcg by mouth.    [provider]  ?budesonide-formoterol (SYMBICORT) 160-4.5 MCG/ACT inhaler Inhale 2 puffs into the lungs 2 (two) times daily. 04/11/18   Shawnee Knapp, MD  ?Calcium-Magnesium-Zinc [id number] MG TABS Take by mouth.    [provider]  ?carisoprodol (SOMA) 350 MG tablet TAKE 1 TABLET (350 MG TOTAL) BY MOUTH 4 (FOUR) TIMES DAILY AS NEEDED FOR MUSCLE SPASMS. 03/01/19   Forrest Moron, MD  ?cetirizine (ZYRTEC) 10 MG tablet Take 10 mg by mouth daily as needed for allergies.     [provider]  ?cholecalciferol (VITAMIN D) 400 UNITS  TABS Take 400 Units by mouth daily.    [provider]  ?citalopram (CELEXA) 20 MG tablet TAKE 1 TABLET BY MOUTH EVERY DAY 01/02/19   Forrest Moron, MD  ?CREON 36000 units CPEP capsule TAKE 1 CAPSULES BY MOUTH AS DIRECTED 4 CAPS WITH A MEAL AND 1 CAP WITH SNACKS 06/26/18   Levin Erp, PA  ?esomeprazole (NEXIUM 24HR) 20 MG capsule Take 40 mg by mouth daily at 12 noon.    [provider]  ?ipratropium (ATROVENT) 0.03 % nasal spray Place 2 sprays into the nose 4 (four) times daily as needed for rhinitis. 01/05/19   Forrest Moron, MD  ?levothyroxine (SYNTHROID) 75 MCG tablet Take 1 tablet (75 mcg total) by mouth daily before breakfast. ON EMPTY STOMACH WITH A SMALL GLASS OF WATER, NO MEDS OR FOODS FOR 20 MIN 09/26/18   Shawnee Knapp, MD  ?montelukast (SINGULAIR) 10 MG tablet Take 1 tablet (10 mg total) by mouth every morning. 09/26/18   Shawnee Knapp, MD  ?Multiple Vitamin (MULTIVITAMIN) tablet Take 1 tablet by mouth daily.    [provider]  ?ondansetron (ZOFRAN) 8 MG tablet Take 8 mg by mouth every 4 (four) hours as needed. 04/09/20   [provider]  ?pregabalin (LYRICA) 150 MG capsule TAKE 1 CAPSULE (150 MG TOTAL) BY MOUTH 3 (THREE) TIMES DAILY. 09/26/18   Shawnee Knapp, MD  ?SUMAtriptan (IMITREX) 100 MG tablet TAKE 1 TAB BY MOUTH EVERY 2 HOURS AS NEEDED FOR MIGRAINE. NO MORE THAN 2 DOSES IN 24 HOURS. 11/15/18   Shawnee Knapp, MD  ?telmisartan-hydrochlorothiazide (MICARDIS HCT) 80-25 MG tablet Take 1 tablet by mouth daily. 01/05/22   Terrilyn Saver, NP  ?traMADol (ULTRAM) 50 MG tablet TAKE 1 TABLET 4 TIMES A DAY 03/01/19   Delia Chimes A, MD  ?vitamin C (ASCORBIC ACID) 500 MG tablet Take 1,000 mg by mouth daily.    [provider]  ?vitamin E 400 UNIT capsule Take 400 Units by mouth daily.    [provider]  ? ?Family History ?Family History  ?Problem Relation Age of Onset  ? COPD Mother   ? Fibromyalgia Mother   ? Heart disease Father   ? Colon polyps  Father   ? Stroke Sister   ? Fibromyalgia Sister   ? Colon polyps Maternal Grandmother   ? Lung disease Paternal Grandmother   ? Stroke Paternal Grandmother   ? Diabetes Paternal Grandfather   ? Heart disease Paternal Grandfather   ? Diabetes Maternal Uncle   ? Colon polyps Maternal Aunt   ? Aortic aneurysm Maternal Aunt   ? Cystic kidney disease Maternal Aunt   ?     kidney cyst  ? Kidney disease Maternal Aunt   ? Fibromyalgia Other   ?  cousin  ? Colon cancer Neg Hx   ? ?Social History ?Social History  ? ?Tobacco Use  ? Smoking status: Never  ? Smokeless tobacco: Never  ?Substance Use Topics  ? Alcohol use: No  ?  Alcohol/week: 0.0 standard drinks  ? Drug use: No  ? ?Allergies   ?Soap and Sulfa antibiotics ? ?Review of Systems ?Review of Systems ?Pertinent findings noted in history of present illness.  ? ?Physical Exam ?Triage Vital Signs ?ED Triage Vitals  ?Enc Vitals Group  ?   BP 08/07/21 0827 (!) 147/82  ?   Pulse Rate 08/07/21 0827 72  ?   Resp 08/07/21 0827 18  ?   Temp 08/07/21 0827 98.3 ?F (36.8 ?C)  ?   Temp Source 08/07/21 0827 Oral  ?   SpO2 08/07/21 0827 98 %  ?   Weight --   ?   Height --   ?   Head Circumference --   ?   Peak Flow --   ?   Pain Score 08/07/21 0826 5  ?   Pain Loc --   ?   Pain Edu? --   ?   Excl. in [place]? --   ?No data found. ? ?Updated Vital Signs ?BP (!) 155/100 (BP Location: Right Wrist)   Pulse 93   Temp 97.7 ?F (36.5 ?C) (Oral)   Resp 18   SpO2 91%  ? ?Physical Exam ?Vitals and nursing note reviewed.  ?Constitutional:   ?   General: She is not in acute distress. ?   Appearance: Normal appearance. She is not ill-appearing.  ?HENT:  ?   Head: Normocephalic and atraumatic.  ?   Salivary Glands: Right salivary gland is not diffusely enlarged or tender. Left salivary gland is not diffusely enlarged or tender.  ?   Right Ear: Tympanic membrane, ear canal and external ear normal. No drainage. No middle ear effusion. There is no impacted cerumen. Tympanic membrane is not  erythematous or bulging.  ?   Left Ear: Tympanic membrane, ear canal and external ear normal. No drainage.  No middle ear effusion. There is no impacted cerumen. Tympanic membrane is not erythematous or bulging.  ?   Nose: No

## 2022-01-13 NOTE — Discharge Instructions (Addendum)
I have initiated request to assist you in finding a primary care provider for regular follow-up of your hypothyroid, hypertension, fibromyalgia and pancreatitis. ? ?You were tested for both COVID and influenza today, the result of your viral testing will be posted to your MyChart once it is complete, this typically takes 24 to 48 hours.  If there is a positive result, you will be contacted by phone with further recommendations, if any.  ? ?I have provided you with a note to be out of work until those results are received. ? ? ? ?

## 2022-01-13 NOTE — ED Triage Notes (Signed)
Pt reports she has been out of work sick and is needing clearance and work note.  ?

## 2022-01-15 LAB — COVID-19, FLU A+B NAA
Influenza A, NAA: NOT DETECTED
Influenza B, NAA: NOT DETECTED
SARS-CoV-2, NAA: NOT DETECTED

## 2022-01-22 ENCOUNTER — Encounter: Payer: Self-pay | Admitting: Family Medicine

## 2022-01-28 ENCOUNTER — Ambulatory Visit: Payer: 59

## 2022-06-17 ENCOUNTER — Encounter: Payer: Self-pay | Admitting: Internal Medicine

## 2022-06-17 ENCOUNTER — Ambulatory Visit: Payer: 59 | Admitting: Internal Medicine

## 2022-06-17 VITALS — BP 134/72 | HR 115 | Temp 96.8°F | Ht 62.0 in | Wt 264.0 lb

## 2022-06-17 DIAGNOSIS — Z79899 Other long term (current) drug therapy: Secondary | ICD-10-CM

## 2022-06-17 DIAGNOSIS — I700 Atherosclerosis of aorta: Secondary | ICD-10-CM | POA: Insufficient documentation

## 2022-06-17 DIAGNOSIS — J4540 Moderate persistent asthma, uncomplicated: Secondary | ICD-10-CM

## 2022-06-17 DIAGNOSIS — I10 Essential (primary) hypertension: Secondary | ICD-10-CM

## 2022-06-17 DIAGNOSIS — E039 Hypothyroidism, unspecified: Secondary | ICD-10-CM

## 2022-06-17 DIAGNOSIS — G43C1 Periodic headache syndromes in child or adult, intractable: Secondary | ICD-10-CM

## 2022-06-17 DIAGNOSIS — K219 Gastro-esophageal reflux disease without esophagitis: Secondary | ICD-10-CM

## 2022-06-17 DIAGNOSIS — M797 Fibromyalgia: Secondary | ICD-10-CM | POA: Diagnosis not present

## 2022-06-17 DIAGNOSIS — R7303 Prediabetes: Secondary | ICD-10-CM

## 2022-06-17 DIAGNOSIS — Z1211 Encounter for screening for malignant neoplasm of colon: Secondary | ICD-10-CM

## 2022-06-17 DIAGNOSIS — Z23 Encounter for immunization: Secondary | ICD-10-CM | POA: Diagnosis not present

## 2022-06-17 DIAGNOSIS — F419 Anxiety disorder, unspecified: Secondary | ICD-10-CM

## 2022-06-17 DIAGNOSIS — G894 Chronic pain syndrome: Secondary | ICD-10-CM

## 2022-06-17 DIAGNOSIS — F32A Depression, unspecified: Secondary | ICD-10-CM | POA: Insufficient documentation

## 2022-06-17 DIAGNOSIS — Z1231 Encounter for screening mammogram for malignant neoplasm of breast: Secondary | ICD-10-CM

## 2022-06-17 DIAGNOSIS — F902 Attention-deficit hyperactivity disorder, combined type: Secondary | ICD-10-CM

## 2022-06-17 DIAGNOSIS — K861 Other chronic pancreatitis: Secondary | ICD-10-CM | POA: Insufficient documentation

## 2022-06-17 DIAGNOSIS — E782 Mixed hyperlipidemia: Secondary | ICD-10-CM

## 2022-06-17 MED ORDER — TRAMADOL HCL 50 MG PO TABS: 50.0000 mg | ORAL_TABLET | Freq: Three times a day (TID) | ORAL | 0 refills | Status: DC | PRN

## 2022-06-17 MED ORDER — ALBUTEROL SULFATE HFA 108 (90 BASE) MCG/ACT IN AERS: INHALATION_SPRAY | RESPIRATORY_TRACT | 1 refills | Status: DC

## 2022-06-17 MED ORDER — CARISOPRODOL 350 MG PO TABS: 350.0000 mg | ORAL_TABLET | Freq: Three times a day (TID) | ORAL | 0 refills | Status: DC

## 2022-06-17 MED ORDER — ESOMEPRAZOLE MAGNESIUM 40 MG PO CPDR: 40.0000 mg | DELAYED_RELEASE_CAPSULE | Freq: Every day | ORAL | 1 refills | Status: DC

## 2022-06-17 MED ORDER — ASPIRIN 81 MG PO TBEC: 81.0000 mg | DELAYED_RELEASE_TABLET | Freq: Every day | ORAL | 12 refills | Status: AC

## 2022-06-17 MED ORDER — PREGABALIN 150 MG PO CAPS: ORAL_CAPSULE | ORAL | 0 refills | Status: DC

## 2022-06-17 NOTE — Assessment & Plan Note (Signed)
Continue Adderall CSA and UDS today 

## 2022-06-17 NOTE — Assessment & Plan Note (Signed)
Encouraged regular exercise, weight loss, stretching and core strengthening Continue Soma, pregabalin and tramadol, refilled today CSA today and UDS today 

## 2022-06-17 NOTE — Assessment & Plan Note (Signed)
Continue citalopram Support offered 

## 2022-06-17 NOTE — Assessment & Plan Note (Signed)
Encouraged regular exercise, weight loss, stretching and core strengthening Continue Soma, pregabalin and tramadol, refilled today CSA today and UDS today

## 2022-06-17 NOTE — Assessment & Plan Note (Signed)
Encourage weight loss as this can help reduce reflux symptoms Avoid for that trigger reflux Continue Esomeprazole, refilled today

## 2022-06-17 NOTE — Assessment & Plan Note (Signed)
Continue Symbicort and albuterol 

## 2022-06-17 NOTE — Assessment & Plan Note (Signed)
Controlled on telmisartan HCT Reinforced DASH diet and exercise weight loss C-Met today

## 2022-06-17 NOTE — Assessment & Plan Note (Signed)
Encourage diet and exercise for weight loss 

## 2022-06-17 NOTE — Assessment & Plan Note (Signed)
Avoid triggers Continue Imitrex as needed

## 2022-06-17 NOTE — Progress Notes (Signed)
HPI  Patient presents to clinic today to establish care and for management of the conditions listed below.  ADD: She reports mainly inattention.  She is taking Adderall only as needed.  She does not follow with psychiatry.  Asthma: Moderate, persistent.  She reports chronic cough or shortness of breath.  She is taking Singulair, Symbicort and Albuterol as prescribed.  She does not follow with asthma and allergy or pulmonology.  Fibromyalgia/Chronic Joint Pain: Mainly in her neck, low back, hips, knees.  She is taking Soma, Pregabalin and Tramadol as prescribed. She does not follow with orthopedics or pain management.  HTN: Her BP today is 134/.  She is taking Telmisartan HCT as prescribed.  ECG from reviewed.  Hypothyroidism: She denies any issues on her current dose of levothyroxine.  She does not follow with endocrinology.  Chronic Pancreatitis: Managed with Creon. She is not currently following with GI.  GERD: She is not sure what triggers this.  She denies breakthrough on Esomeprazole.  There is no upper GI on file.  Migraines: These occur every few months.  Triggered by fragrance, artificial sweetners.  She is taking Imitrex as prescribed.  She does not follow with neurology.  Anxiety and Depression: Chronic, managed on Citalopram.  She is not currently seeing a therapist.  She denies SI/HI.  Prediabetes: Her last A1c was 6%, 12/2021.  She is not taking any oral diabetic medication at this time.  She does not check her sugars.  HLD with Aortic Atheroscleorosis: Her last LDL was 137, triglycerides 405, 12/2021.  She is not taking any cholesterol-lowering medication at this time.  She does not consume a low-fat diet.  Past Medical History:  Diagnosis Date   ADD (attention deficit disorder)    Allergy    ENVIRONMENTAL   Arthritis    knees   Asthma    Depression    Difficulty sleeping    Dysphagia    Fibromyalgia    GERD (gastroesophageal reflux disease)    Hernia of abdominal  wall    Hyperlipidemia    Hypertension    IBS (irritable bowel syndrome)    Migraine    Numbness and tingling in hands    Obesity    Swelling of both ankles    Thyroid disease     Current Outpatient Medications  Medication Sig Dispense Refill   albuterol (VENTOLIN HFA) 108 (90 Base) MCG/ACT inhaler TAKE 2 PUFFS BY MOUTH EVERY 6 HOURS AS NEEDED FOR WHEEZE OR SHORTNESS OF BREATH 6.7 Inhaler 1   amphetamine-dextroamphetamine (ADDERALL) 10 MG tablet Take 1 tablet (10 mg total) by mouth 2 (two) times daily with a meal. 60 tablet 0   azelastine (OPTIVAR) 0.05 % ophthalmic solution PLACE TWO DROPS INTO BOTH EYES TWICE DAILY FOR DRY EYES 6 mL 3   b complex vitamins tablet Take 1 tablet by mouth daily.     Biotin 1000 MCG CHEW Chew 5,000 mcg by mouth.     budesonide-formoterol (SYMBICORT) 160-4.5 MCG/ACT inhaler Inhale 2 puffs into the lungs 2 (two) times daily. 1 Inhaler 12   Calcium-Magnesium-Zinc 167-83-8 MG TABS Take by mouth.     carisoprodol (SOMA) 350 MG tablet TAKE 1 TABLET (350 MG TOTAL) BY MOUTH 4 (FOUR) TIMES DAILY AS NEEDED FOR MUSCLE SPASMS. 120 tablet 2   cetirizine (ZYRTEC) 10 MG tablet Take 10 mg by mouth daily as needed for allergies.      cholecalciferol (VITAMIN D) 400 UNITS TABS Take 400 Units by mouth daily.  citalopram (CELEXA) 20 MG tablet TAKE 1 TABLET BY MOUTH EVERY DAY 30 tablet 5   CREON 36000 units CPEP capsule TAKE 1 CAPSULES BY MOUTH AS DIRECTED 4 CAPS WITH A MEAL AND 1 CAP WITH SNACKS 390 capsule 11   esomeprazole (NEXIUM 24HR) 20 MG capsule Take 40 mg by mouth daily at 12 noon.     ipratropium (ATROVENT) 0.03 % nasal spray Place 2 sprays into the nose 4 (four) times daily as needed for rhinitis. 30 mL 0   levothyroxine (SYNTHROID) 75 MCG tablet Take 1 tablet (75 mcg total) by mouth daily before breakfast. ON EMPTY STOMACH WITH A SMALL GLASS OF WATER, NO MEDS OR FOODS FOR 20 MIN 90 tablet 3   montelukast (SINGULAIR) 10 MG tablet Take 1 tablet (10 mg total) by  mouth every morning. 90 tablet 3   Multiple Vitamin (MULTIVITAMIN) tablet Take 1 tablet by mouth daily.     ondansetron (ZOFRAN) 8 MG tablet Take 8 mg by mouth every 4 (four) hours as needed.     pregabalin (LYRICA) 150 MG capsule TAKE 1 CAPSULE (150 MG TOTAL) BY MOUTH 3 (THREE) TIMES DAILY. 90 capsule 2   SUMAtriptan (IMITREX) 100 MG tablet TAKE 1 TAB BY MOUTH EVERY 2 HOURS AS NEEDED FOR MIGRAINE. NO MORE THAN 2 DOSES IN 24 HOURS. 8 tablet 3   telmisartan-hydrochlorothiazide (MICARDIS HCT) 80-25 MG tablet Take 1 tablet by mouth daily. 90 tablet 3   traMADol (ULTRAM) 50 MG tablet TAKE 1 TABLET 4 TIMES A DAY 120 tablet 2   vitamin C (ASCORBIC ACID) 500 MG tablet Take 1,000 mg by mouth daily.     vitamin E 400 UNIT capsule Take 400 Units by mouth daily.     No current facility-administered medications for this visit.    Allergies  Allergen Reactions   Soap     Any soaps with fragrance cause migraines   Sulfa Antibiotics Nausea And Vomiting    Family History  Problem Relation Age of Onset   COPD Mother    Fibromyalgia Mother    Heart disease Father    Colon polyps Father    Stroke Sister    Fibromyalgia Sister    Colon polyps Maternal Grandmother    Lung disease Paternal Grandmother    Stroke Paternal Grandmother    Diabetes Paternal Grandfather    Heart disease Paternal Grandfather    Diabetes Maternal Uncle    Colon polyps Maternal Aunt    Aortic aneurysm Maternal Aunt    Cystic kidney disease Maternal Aunt        kidney cyst   Kidney disease Maternal Aunt    Fibromyalgia Other        cousin   Colon cancer Neg Hx     Social History   Socioeconomic History   Marital status: Divorced    Spouse name: Not on file   Number of children: 0   Years of education: Not on file   Highest education level: Not on file  Occupational History   Occupation: Child psychotherapist    Employer: [employer]  Tobacco Use   Smoking status: Never   Smokeless tobacco: Never  Substance and  Sexual Activity   Alcohol use: No    Alcohol/week: 0.0 standard drinks of alcohol   Drug use: No   Sexual activity: Not on file  Other Topics Concern   Not on file  Social History Narrative   Married. Education: [employer].    Social Determinants of Health  Financial Resource Strain: Not on file  Food Insecurity: Not on file  Transportation Needs: Not on file  Physical Activity: Not on file  Stress: Not on file  Social Connections: Not on file  Intimate Partner Violence: Not on file    ROS:  Constitutional: Patient reports intermittent headaches.  Denies fever, malaise, fatigue, or abrupt weight changes.  HEENT: Denies eye pain, eye redness, ear pain, ringing in the ears, wax buildup, runny nose, nasal congestion, bloody nose, or sore throat. Respiratory: Patient reports chronic cough and shortness of breath.  Denies difficulty breathing, or sputum production.   Cardiovascular: Denies chest pain, chest tightness, palpitations or swelling in the hands or feet.  Gastrointestinal: Denies abdominal pain, bloating, constipation, diarrhea or blood in the stool.  GU: Denies frequency, urgency, pain with urination, blood in urine, odor or discharge. Musculoskeletal: Patient reports chronic joint and muscle pain.  Denies decrease in range of motion, difficulty with gait, or joint swelling.  Skin: Denies redness, rashes, lesions or ulcercations.  Neurological: Patient reports insomnia, inattention, numbness and tingling in her feet.  Denies dizziness, difficulty with memory, difficulty with speech or problems with balance and coordination.  Psych: Patient has a history of anxiety and depression.  Denies SI/HI.  No other specific complaints in a complete review of systems (except as listed in HPI above).  PE:  BP 134/72 (BP Location: Left Arm, Patient Position: Sitting, Cuff Size: Large)   Pulse (!) 115   Temp (!) 96.8 F (36 C) (Temporal)   Ht 5\' 2"  (1.575 m)   Wt 264 lb (119.7 kg)    SpO2 96%   BMI 48.29 kg/m   Wt Readings from Last 3 Encounters:  01/05/22 280 lb 12.8 oz (127.4 kg)  04/29/20 274 lb 4 oz (124.4 kg)  03/28/20 280 lb 4.8 oz (127.1 kg)    General: Appears her stated age, obese, chronically ill-appearing in NAD. Skin: Warm, clammy but intact. HEENT: Head: normal shape and size; Eyes: sclera white, no icterus, conjunctiva pink, PERRLA and EOMs intact;  Neck: Neck supple, trachea midline. No masses, lumps or thyromegaly present.  Cardiovascular: Tachycardic with normal rhythm. S1,S2 noted.  No murmur, rubs or gallops noted. No JVD or BLE edema. No carotid bruits noted. Pulmonary/Chest: Normal effort and positive vesicular breath sounds. No respiratory distress. No wheezes, rales or ronchi noted.  Abdomen: Soft and nontender.  Musculoskeletal: Strength 5/5 BUE/BLE.  Gait steady with use of rolling walker. Neurological: Alert and oriented.  Psychiatric: Mood and affect normal.  Anxious appearing.. Judgment and thought content normal.    BMET    Component Value Date/Time   NA 140 01/05/2022 1107   NA 142 01/05/2019 1601   K 3.4 (L) 01/05/2022 1107   CL 100 01/05/2022 1107   CO2 32 01/05/2022 1107   GLUCOSE 98 01/05/2022 1107   BUN 13 01/05/2022 1107   BUN 12 01/05/2019 1601   CREATININE 0.77 01/05/2022 1107   CREATININE 0.65 02/28/2016 1233   CALCIUM 9.2 01/05/2022 1107   GFRNONAA >60 03/28/2020 1052   GFRNONAA >89 08/23/2014 1707   GFRAA >60 03/28/2020 1052   GFRAA >89 08/23/2014 1707    Lipid Panel     Component Value Date/Time   CHOL 236 (H) 01/05/2022 1107   CHOL 221 (H) 01/05/2019 1601   TRIG (H) 01/05/2022 1107    405.0 Triglyceride is over 400; calculations on Lipids are invalid.   HDL 43.30 01/05/2022 1107   HDL 41 01/05/2019 1601   CHOLHDL  5 01/05/2022 1107   VLDL 33 08/31/2013 0918   LDLCALC 137 (H) 01/05/2019 1601    CBC    Component Value Date/Time   WBC 7.7 01/05/2022 1107   RBC 4.25 01/05/2022 1107   HGB 12.8  01/05/2022 1107   HGB 12.8 06/15/2017 1722   HCT 38.1 01/05/2022 1107   HCT 38.2 06/15/2017 1722   PLT 174.0 01/05/2022 1107   PLT 216 06/15/2017 1722   MCV 89.8 01/05/2022 1107   MCV 90.5 01/23/2019 1400   MCV 88 06/15/2017 1722   MCH 30.3 03/28/2020 1052   MCHC 33.7 01/05/2022 1107   RDW 14.6 01/05/2022 1107   RDW 13.9 06/15/2017 1722   LYMPHSABS 2.1 03/28/2020 1052   LYMPHSABS 2.1 06/15/2017 1722   MONOABS 0.9 03/28/2020 1052   EOSABS 0.3 03/28/2020 1052   EOSABS 0.2 06/15/2017 1722   BASOSABS 0.1 03/28/2020 1052   BASOSABS 0.0 06/15/2017 1722    Hgb A1C Lab Results  Component Value Date   HGBA1C 6.0 01/05/2022     Assessment and Plan:   RTC in 6 months for your annual exam

## 2022-06-17 NOTE — Assessment & Plan Note (Signed)
Continue Creon Encourage low-fat diet

## 2022-06-17 NOTE — Assessment & Plan Note (Signed)
C-Met and lipid profile today Encourage her to consume a low-fat diet We will have her start baby aspirin

## 2022-06-17 NOTE — Assessment & Plan Note (Signed)
TSH and free T4 today We will adjust levothyroxine if needed based on labs 

## 2022-06-17 NOTE — Assessment & Plan Note (Signed)
A1c today Encouraged low carb diet and exercise for weight loss 

## 2022-06-17 NOTE — Assessment & Plan Note (Signed)
C-Met and lipid profile today Encourage her to consume a low-fat diet

## 2022-06-17 NOTE — Patient Instructions (Signed)

## 2022-06-18 LAB — COMPLETE METABOLIC PANEL WITH GFR
AG Ratio: 1.5 (calc) (ref 1.0–2.5)
ALT: 12 U/L (ref 6–29)
AST: 14 U/L (ref 10–35)
Albumin: 4.3 g/dL (ref 3.6–5.1)
Alkaline phosphatase (APISO): 86 U/L (ref 37–153)
BUN: 25 mg/dL (ref 7–25)
CO2: 29 mmol/L (ref 20–32)
Calcium: 9.1 mg/dL (ref 8.6–10.4)
Chloride: 96 mmol/L — ABNORMAL LOW (ref 98–110)
Creat: 0.98 mg/dL (ref 0.50–1.03)
Globulin: 2.9 g/dL (calc) (ref 1.9–3.7)
Glucose, Bld: 99 mg/dL (ref 65–99)
Potassium: 3.2 mmol/L — ABNORMAL LOW (ref 3.5–5.3)
Sodium: 138 mmol/L (ref 135–146)
Total Bilirubin: 0.6 mg/dL (ref 0.2–1.2)
Total Protein: 7.2 g/dL (ref 6.1–8.1)
eGFR: 67 mL/min/{1.73_m2} (ref >=60)

## 2022-06-18 LAB — HEMOGLOBIN A1C
Hgb A1c MFr Bld: 5.6 % of total Hgb (ref <5.7)
Mean Plasma Glucose: 114 mg/dL
eAG (mmol/L): 6.3 mmol/L

## 2022-06-18 LAB — CBC
HCT: 40.8 % (ref 35.0–45.0)
Hemoglobin: 13.9 g/dL (ref 11.7–15.5)
MCH: 31.2 pg (ref 27.0–33.0)
MCHC: 34.1 g/dL (ref 32.0–36.0)
MCV: 91.5 fL (ref 80.0–100.0)
MPV: 12.4 fL (ref 7.5–12.5)
Platelets: 233 10*3/uL (ref 140–400)
RBC: 4.46 10*6/uL (ref 3.80–5.10)
RDW: 12.5 % (ref 11.0–15.0)
WBC: 11.9 10*3/uL — ABNORMAL HIGH (ref 3.8–10.8)

## 2022-06-18 LAB — LIPID PANEL
Cholesterol: 268 mg/dL — ABNORMAL HIGH (ref <200)
HDL: 55 mg/dL (ref >=50)
LDL Cholesterol (Calc): 183 mg/dL (calc) — ABNORMAL HIGH
Non-HDL Cholesterol (Calc): 213 mg/dL (calc) — ABNORMAL HIGH (ref <130)
Total CHOL/HDL Ratio: 4.9 (calc) (ref <5.0)
Triglycerides: 153 mg/dL — ABNORMAL HIGH (ref <150)

## 2022-06-18 LAB — TSH: TSH: 3.99 mIU/L (ref 0.40–4.50)

## 2022-06-18 LAB — T4, FREE: Free T4: 1.6 ng/dL (ref 0.8–1.8)

## 2022-07-19 ENCOUNTER — Other Ambulatory Visit: Payer: Self-pay | Admitting: Internal Medicine

## 2022-07-19 DIAGNOSIS — M797 Fibromyalgia: Secondary | ICD-10-CM

## 2022-07-19 DIAGNOSIS — G894 Chronic pain syndrome: Secondary | ICD-10-CM

## 2022-07-20 NOTE — Telephone Encounter (Signed)
Requested medication (s) are due for refill today: yes  Requested medication (s) are on the active medication list: yes  Last refill:  all ordered 06/17/22  Future visit scheduled: no  Notes to clinic:  meds not delegated to NT to reorder   Requested Prescriptions  Pending Prescriptions Disp Refills   carisoprodol (SOMA) 350 MG tablet [Pharmacy Med Name: CARISOPRODOL 350MG  TABLETS] 90 tablet     Sig: TAKE 1 TABLET BY MOUTH FOUR TIMES DAILY AS NEEDED FOR MUSCLE SPASMS     Not Delegated - Analgesics:  Muscle Relaxants Failed - 07/19/2022  3:30 PM      Failed - This refill cannot be delegated      Passed - Valid encounter within last 6 months    Recent Outpatient Visits           1 month ago Need for influenza vaccination   [facility] [place], [place], NP   3 years ago Cough   [facility] at [facility], [facility], MD   3 years ago Essential hypertension   Primary Care at [facility], [facility], MD   3 years ago Attention deficit hyperactivity disorder (ADHD), combined type   [facility] at [facility], [facility], MD   3 years ago Idiopathic chronic pancreatitis [facility])   Primary Care at [facility], Etta Grandchild, MD               traMADol (ULTRAM) 50 MG tablet [Pharmacy Med Name: TRAMADOL 50MG  TABLETS] 90 tablet     Sig: TAKE 1 TABLET BY MOUTH FOUR TIMES DAILY     Not Delegated - Analgesics:  Opioid Agonists Failed - 07/19/2022  3:30 PM      Failed - This refill cannot be delegated      Failed - Urine Drug Screen completed in last 360 days      Passed - Valid encounter within last 3 months    Recent Outpatient Visits           1 month ago Need for influenza vaccination   [facility] [place], [facility], NP   3 years ago Cough   Primary [facility] at [facility], Salvadore Oxford A, MD   3 years ago Essential hypertension   Primary Care at [facility], [place] A, MD   3 years ago Attention deficit hyperactivity disorder (ADHD), combined type   Primary  Care at [facility], [place], MD   3 years ago Idiopathic chronic pancreatitis [facility])   Primary Care at Manus Rudd, [facility], MD               pregabalin (LYRICA) 150 MG capsule [Pharmacy Med Name: PREGABALIN 150MG  CAPSULES] 90 capsule     Sig: TAKE 1 CAPSULE(150 MG) BY MOUTH THREE TIMES DAILY     Not Delegated - Neurology:  Anticonvulsants - Controlled - pregabalin Failed - 07/19/2022  3:30 PM      Failed - This refill cannot be delegated      Passed - Cr in normal range and within 360 days    Creat  Date Value Ref Range Status  06/17/2022 0.98 0.50 - 1.03 mg/dL Final         Passed - Completed PHQ-2 or PHQ-9 in the last 360 days      Passed - Valid encounter within last 12 months    Recent Outpatient Visits           1 month ago Need for influenza  vaccination   [facility] [place], Coralie Keens, NP   3 years ago Cough   [facility] at [facility], Arlie Solomons, MD   3 years ago Essential hypertension   Primary Care at [facility], Arlie Solomons, MD   3 years ago Attention deficit hyperactivity disorder (ADHD), combined type   Primary Care at [facility], Arlie Solomons, MD   3 years ago Idiopathic chronic pancreatitis [facility])   Primary Care at [facility], Laurey Arrow, MD

## 2022-08-18 ENCOUNTER — Other Ambulatory Visit: Payer: Self-pay | Admitting: Internal Medicine

## 2022-08-18 DIAGNOSIS — M797 Fibromyalgia: Secondary | ICD-10-CM

## 2022-08-18 DIAGNOSIS — G894 Chronic pain syndrome: Secondary | ICD-10-CM

## 2022-08-18 NOTE — Telephone Encounter (Signed)
Requested Prescriptions  Pending Prescriptions Disp Refills   albuterol (VENTOLIN HFA) 108 (90 Base) MCG/ACT inhaler [Pharmacy Med Name: ALBUTEROL HFA INH (200 PUFFS) 8.5GM] 8.5 g 1    Sig: TAKE 2 PUFFS INTO EVERY 6 HOURS AS NEEDED     Pulmonology:  Beta Agonists 2 Failed - 08/18/2022 12:59 PM      Failed - Last Heart Rate in normal range    Pulse Readings from Last 1 Encounters:  06/17/22 (!) 115         Passed - Last BP in normal range    BP Readings from Last 1 Encounters:  06/17/22 134/72         Passed - Valid encounter within last 12 months    Recent Outpatient Visits           2 months ago Need for influenza vaccination   [facility], Salvadore Oxford, NP   3 years ago Cough   Primary Care at [facility], Manus Rudd, MD   3 years ago Essential hypertension   Primary Care at [facility] [place], MD   3 years ago Attention deficit hyperactivity disorder (ADHD), combined type   Primary Care at [facility], Manus Rudd, MD   3 years ago Idiopathic chronic pancreatitis [facility])   Primary Care at Etta Grandchild, Levell July, MD               carisoprodol (SOMA) 350 MG tablet [Pharmacy Med Name: CARISOPRODOL 350MG  TABLETS] 90 tablet     Sig: TAKE 1 TABLET BY MOUTH FOUR TIMES DAILY AS NEEDED FOR MUSCLE SPASMS     Not Delegated - Analgesics:  Muscle Relaxants Failed - 08/18/2022 12:59 PM      Failed - This refill cannot be delegated      Passed - Valid encounter within last 6 months    Recent Outpatient Visits           2 months ago Need for influenza vaccination   [facility] [place], Mullins, NP   3 years ago Cough   [facility] at [facility], [facility], MD   3 years ago Essential hypertension   [facility] at [facility], [facility], MD   3 years ago Attention deficit hyperactivity disorder (ADHD), combined type   Primary Care at [facility], [facility], MD   3 years ago Idiopathic chronic pancreatitis [facility])   Primary Care at [facility], Etta Grandchild, MD               traMADol (ULTRAM) 50 MG tablet [Pharmacy Med Name: TRAMADOL 50MG  TABLETS] 90 tablet     Sig: TAKE 1 TABLET BY MOUTH FOUR TIMES DAILY     Not Delegated - Analgesics:  Opioid Agonists Failed - 08/18/2022 12:59 PM      Failed - This refill cannot be delegated      Failed - Urine Drug Screen completed in last 360 days      Passed - Valid encounter within last 3 months    Recent Outpatient Visits           2 months ago Need for influenza vaccination   [facility] [place], [facility], NP   3 years ago Cough   Primary Care at [facility], Salvadore Oxford, MD   3 years ago Essential hypertension   Primary Care at [facility], Manus Rudd A, MD   3 years ago Attention deficit hyperactivity disorder (ADHD), combined type  Primary Care at [facility], Manus Rudd, MD   3 years ago Idiopathic chronic pancreatitis [facility])   Primary Care at Et[facility] Grandchild, Levell July, MD               pregabalin (LYRICA) 150 MG capsule [Pharmacy Med Name: PREGABALIN 150MG  CAPSULES] 90 capsule     Sig: TAKE 1 CAPSULE(150 MG) BY MOUTH THREE TIMES DAILY     Not Delegated - Neurology:  Anticonvulsants - Controlled - pregabalin Failed - 08/18/2022 12:59 PM      Failed - This refill cannot be delegated      Passed - Cr in normal range and within 360 days    Creat  Date Value Ref Range Status  06/17/2022 0.98 0.50 - 1.03 mg/dL Final         Passed - Completed PHQ-2 or PHQ-9 in the last 360 days      Passed - Valid encounter within last 12 months    Recent Outpatient Visits           2 months ago Need for influenza vaccination   [facility], Mullins, NP   3 years ago Cough   [facility] at [facility], [facility], MD   3 years ago Essential hypertension   Primary Care at [facility], [facility], MD   3 years ago Attention deficit hyperactivity disorder (ADHD), combined type   [facility] at [facility], [facility], MD   3 years ago Idiopathic  chronic pancreatitis [facility])   Primary Care at [facility], Etta Grandchild, MD

## 2022-08-18 NOTE — Telephone Encounter (Signed)
Requested medication (s) are due for refill today: yes  Requested medication (s) are on the active medication list: yes  Last refill:  all refilled last 07/20/22 #90   Future visit scheduled: no  Notes to clinic:  all meds requested not delegated to NT to reorder   Requested Prescriptions  Pending Prescriptions Disp Refills   carisoprodol (SOMA) 350 MG tablet [Pharmacy Med Name: CARISOPRODOL 350MG  TABLETS] 90 tablet     Sig: TAKE 1 TABLET BY MOUTH FOUR TIMES DAILY AS NEEDED FOR MUSCLE SPASMS     Not Delegated - Analgesics:  Muscle Relaxants Failed - 08/18/2022 12:59 PM      Failed - This refill cannot be delegated      Passed - Valid encounter within last 6 months    Recent Outpatient Visits           2 months ago Need for influenza vaccination   [facility] [place], Mullins, NP   3 years ago Cough   [facility] at [facility], [facility], MD   3 years ago Essential hypertension   [facility] at [facility], [facility], MD   3 years ago Attention deficit hyperactivity disorder (ADHD), combined type   Primary [facility] at [facility], [facility], MD   3 years ago Idiopathic chronic pancreatitis [facility])   Primary Care at [facility], Etta Grandchild, MD               traMADol (ULTRAM) 50 MG tablet [Pharmacy Med Name: TRAMADOL 50MG  TABLETS] 90 tablet     Sig: TAKE 1 TABLET BY MOUTH FOUR TIMES DAILY     Not Delegated - Analgesics:  Opioid Agonists Failed - 08/18/2022 12:59 PM      Failed - This refill cannot be delegated      Failed - Urine Drug Screen completed in last 360 days      Passed - Valid encounter within last 3 months    Recent Outpatient Visits           2 months ago Need for influenza vaccination   [facility], [facility], NP   3 years ago Cough   [facility] at [facility], Salvadore Oxford, MD   3 years ago Essential hypertension   Primary Care at [facility], Manus Rudd A, MD   3 years ago Attention deficit hyperactivity disorder  (ADHD), combined type   Primary Care at [facility], [place] A, MD   3 years ago Idiopathic chronic pancreatitis [facility])   Primary Care at [place], [facility], MD               pregabalin (LYRICA) 150 MG capsule [Pharmacy Med Name: PREGABALIN 150MG  CAPSULES] 90 capsule     Sig: TAKE 1 CAPSULE(150 MG) BY MOUTH THREE TIMES DAILY     Not Delegated - Neurology:  Anticonvulsants - Controlled - pregabalin Failed - 08/18/2022 12:59 PM      Failed - This refill cannot be delegated      Passed - Cr in normal range and within 360 days    Creat  Date Value Ref Range Status  06/17/2022 0.98 0.50 - 1.03 mg/dL Final         Passed - Completed PHQ-2 or PHQ-9 in the last 360 days      Passed - Valid encounter within last 12 months    Recent Outpatient Visits           2 months ago Need  for influenza vaccination   [facility] [place], Salvadore Oxford, NP   3 years ago Cough   Primary Care at [facility], Manus Rudd, MD   3 years ago Essential hypertension   Primary Care at [facility], Manus Rudd, MD   3 years ago Attention deficit hyperactivity disorder (ADHD), combined type   Primary Care at [facility], [place] A, MD   3 years ago Idiopathic chronic pancreatitis [facility])   Primary Care at Et[facility] Grandchild, Levell July, MD              Signed Prescriptions Disp Refills   albuterol (VENTOLIN HFA) 108 (90 Base) MCG/ACT inhaler 8.5 g 1    Sig: TAKE 2 PUFFS INTO EVERY 6 HOURS AS NEEDED     Pulmonology:  Beta Agonists 2 Failed - 08/18/2022 12:59 PM      Failed - Last Heart Rate in normal range    Pulse Readings from Last 1 Encounters:  06/17/22 (!) 115         Passed - Last BP in normal range    BP Readings from Last 1 Encounters:  06/17/22 134/72         Passed - Valid encounter within last 12 months    Recent Outpatient Visits           2 months ago Need for influenza vaccination   [facility] [place], Salvadore Oxford, NP   3 years ago Cough   Primary Care at [facility], Manus Rudd, MD   3 years ago Essential hypertension   [facility] at [facility], Manus Rudd, MD   3 years ago Attention deficit hyperactivity disorder (ADHD), combined type   Primary Care at [facility], Manus Rudd, MD   3 years ago Idiopathic chronic pancreatitis [facility])   Primary Care at Et[facility] Grandchild, Levell July, MD

## 2022-09-17 ENCOUNTER — Other Ambulatory Visit: Payer: Self-pay | Admitting: Internal Medicine

## 2022-09-17 DIAGNOSIS — M797 Fibromyalgia: Secondary | ICD-10-CM

## 2022-09-17 DIAGNOSIS — G894 Chronic pain syndrome: Secondary | ICD-10-CM

## 2022-09-20 NOTE — Telephone Encounter (Signed)
Requested medication (s) are due for refill today:yes  Requested medication (s) are on the active medication list: yes Last refill:  08/19/22  Future visit scheduled: no  Notes to clinic:  Unable to refill per protocol, cannot delegate.      Requested Prescriptions  Pending Prescriptions Disp Refills   carisoprodol (SOMA) 350 MG tablet [Pharmacy Med Name: CARISOPRODOL 350MG  TABLETS] 90 tablet     Sig: TAKE 1 TABLET BY MOUTH FOUR TIMES DAILY AS NEEDED FOR MUSCLE SPASMS     Not Delegated - Analgesics:  Muscle Relaxants Failed - 09/17/2022 12:56 PM      Failed - This refill cannot be delegated      Passed - Valid encounter within last 6 months    Recent Outpatient Visits           3 months ago Need for influenza vaccination   [facility] [place], Mullins, NP   3 years ago Cough   [facility] at [facility], [facility], MD   3 years ago Essential hypertension   Primary Care at [facility], [facility], MD   3 years ago Attention deficit hyperactivity disorder (ADHD), combined type   [facility] at [facility], [facility], MD   3 years ago Idiopathic chronic pancreatitis [facility])   Primary Care at [facility], Etta Grandchild, MD               traMADol (ULTRAM) 50 MG tablet [Pharmacy Med Name: TRAMADOL 50MG  TABLETS] 90 tablet     Sig: TAKE 1 TABLET BY MOUTH FOUR TIMES DAILY     Not Delegated - Analgesics:  Opioid Agonists Failed - 09/17/2022 12:56 PM      Failed - This refill cannot be delegated      Failed - Urine Drug Screen completed in last 360 days      Failed - Valid encounter within last 3 months    Recent Outpatient Visits           3 months ago Need for influenza vaccination   [facility], [facility], NP   3 years ago Cough   Primary Care at [facility], Salvadore Oxford, MD   3 years ago Essential hypertension   Primary Care at [facility], Manus Rudd A, MD   3 years ago Attention deficit hyperactivity disorder (ADHD), combined type    Primary Care at [facility], [place] A, MD   3 years ago Idiopathic chronic pancreatitis [facility])   Primary Care at [place], [facility], MD               pregabalin (LYRICA) 150 MG capsule [Pharmacy Med Name: PREGABALIN 150MG  CAPSULES] 90 capsule     Sig: TAKE 1 CAPSULE(150 MG) BY MOUTH THREE TIMES DAILY     Not Delegated - Neurology:  Anticonvulsants - Controlled - pregabalin Failed - 09/17/2022 12:56 PM      Failed - This refill cannot be delegated      Passed - Cr in normal range and within 360 days    Creat  Date Value Ref Range Status  06/17/2022 0.98 0.50 - 1.03 mg/dL Final         Passed - Completed PHQ-2 or PHQ-9 in the last 360 days      Passed - Valid encounter within last 12 months    Recent Outpatient Visits           3 months ago Need for influenza vaccination    [facility], Salvadore Oxford, NP   3 years ago Cough   Primary Care at [facility], Manus Rudd, MD   3 years ago Essential hypertension   Primary [facility] at [facility], Manus Rudd, MD   3 years ago Attention deficit hyperactivity disorder (ADHD), combined type   Primary Care at [facility], Manus Rudd, MD   3 years ago Idiopathic chronic pancreatitis [facility])   Primary Care at [facility] Grandchild, Levell July, MD

## 2022-10-15 ENCOUNTER — Other Ambulatory Visit: Payer: Self-pay | Admitting: Internal Medicine

## 2022-10-15 DIAGNOSIS — M797 Fibromyalgia: Secondary | ICD-10-CM

## 2022-10-15 DIAGNOSIS — G894 Chronic pain syndrome: Secondary | ICD-10-CM

## 2022-10-15 NOTE — Telephone Encounter (Signed)
Requested medications are due for refill today.  unsure  Requested medications are on the active medications list.  yes  Last refill. All 3 refilled 09/20/2022 #90 0 rf  Future visit scheduled.   no  Notes to clinic.  Refills not delegated.    Requested Prescriptions  Pending Prescriptions Disp Refills   carisoprodol (SOMA) 350 MG tablet [Pharmacy Med Name: CARISOPRODOL 350MG  TABLETS] 90 tablet     Sig: TAKE 1 TABLET BY MOUTH FOUR TIMES DAILY AS NEEDED FOR MUSCLE SPASMS     Not Delegated - Analgesics:  Muscle Relaxants Failed - 10/15/2022  1:32 PM      Failed - This refill cannot be delegated      Passed - Valid encounter within last 6 months    Recent Outpatient Visits           4 months ago Need for influenza vaccination   [facility] [place], Coralie Keens, NP   3 years ago Cough   Primary Care at [facility], Arlie Solomons, MD   3 years ago Essential hypertension   Primary Care at [facility], [place], MD   3 years ago Attention deficit hyperactivity disorder (ADHD), combined type   Primary Care at [facility] [place] A, MD   4 years ago Idiopathic chronic pancreatitis [facility])   Primary Care at [facility], Laurey Arrow, MD               pregabalin (LYRICA) 150 MG capsule [Pharmacy Med Name: PREGABALIN 150MG  CAPSULES] 90 capsule     Sig: TAKE 1 CAPSULE(150 MG) BY MOUTH THREE TIMES DAILY     Not Delegated - Neurology:  Anticonvulsants - Controlled - pregabalin Failed - 10/15/2022  1:32 PM      Failed - This refill cannot be delegated      Passed - Cr in normal range and within 360 days    Creat  Date Value Ref Range Status  06/17/2022 0.98 0.50 - 1.03 mg/dL Final         Passed - Completed PHQ-2 or PHQ-9 in the last 360 days      Passed - Valid encounter within last 12 months    Recent Outpatient Visits           4 months ago Need for influenza vaccination   [facility], Coralie Keens, NP   3 years ago Cough   Primary [facility] at [facility], Arlie Solomons, MD   3 years ago Essential hypertension   Primary Care at [facility], Arlie Solomons, MD   3 years ago Attention deficit hyperactivity disorder (ADHD), combined type   Primary [facility] at [facility], Arlie Solomons, MD   4 years ago Idiopathic chronic pancreatitis [facility])   Primary Care at [facility], Laurey Arrow, MD               traMADol (ULTRAM) 50 MG tablet [Pharmacy Med Name: TRAMADOL 50MG  TABLETS] 90 tablet     Sig: TAKE 1 TABLET(50 MG) BY MOUTH THREE TIMES DAILY AS NEEDED     Not Delegated - Analgesics:  Opioid Agonists Failed - 10/15/2022  1:32 PM      Failed - This refill cannot be delegated      Failed - Urine Drug Screen completed in last 360 days      Failed - Valid encounter within last 3 months    Recent Outpatient Visits           4  months ago Need for influenza vaccination   [facility] [place], Coralie Keens, NP   3 years ago Cough   [facility] at [facility], Arlie Solomons, MD   3 years ago Essential hypertension   [facility] at [facility], Arlie Solomons, MD   3 years ago Attention deficit hyperactivity disorder (ADHD), combined type   Primary Care at [employer], MD   4 years ago Idiopathic chronic pancreatitis [facility])   Primary Care at [facility], Laurey Arrow, MD

## 2022-11-20 ENCOUNTER — Other Ambulatory Visit: Payer: Self-pay | Admitting: Internal Medicine

## 2022-11-20 DIAGNOSIS — M797 Fibromyalgia: Secondary | ICD-10-CM

## 2022-11-20 DIAGNOSIS — G894 Chronic pain syndrome: Secondary | ICD-10-CM

## 2022-11-22 NOTE — Telephone Encounter (Signed)
Requested medication (s) are due for refill today: yes  Requested medication (s) are on the active medication list: yes  Last refill:  10/15/22 #90/0  Future visit scheduled: no  Notes to clinic:  Unable to refill per protocol, cannot delegate.    Requested Prescriptions  Pending Prescriptions Disp Refills   carisoprodol (SOMA) 350 MG tablet [Pharmacy Med Name: CARISOPRODOL 350MG TABLETS] 90 tablet     Sig: TAKE 1 TABLET BY MOUTH FOUR TIMES DAILY AS NEEDED FOR MUSCLE SPASMS     Not Delegated - Analgesics:  Muscle Relaxants Failed - 11/20/2022  9:17 AM      Failed - This refill cannot be delegated      Passed - Valid encounter within last 6 months    Recent Outpatient Visits           5 months ago Need for influenza vaccination   [facility], Coralie Keens, NP   3 years ago Cough   Primary Care at [facility], Arlie Solomons, MD   3 years ago Essential hypertension   Primary Care at [facility], [place], MD   3 years ago Attention deficit hyperactivity disorder (ADHD), combined type   Primary Care at [facility], [place] A, MD   4 years ago Idiopathic chronic pancreatitis [facility])   Primary [facility] at [facility], Laurey Arrow, MD               pregabalin (LYRICA) 150 MG capsule [Pharmacy Med Name: PREGABALIN 150MG CAPSULES] 90 capsule     Sig: TAKE 1 CAPSULE(150 MG) BY MOUTH THREE TIMES DAILY     Not Delegated - Neurology:  Anticonvulsants - Controlled - pregabalin Failed - 11/20/2022  9:17 AM      Failed - This refill cannot be delegated      Passed - Cr in normal range and within 360 days    Creat  Date Value Ref Range Status  06/17/2022 0.98 0.50 - 1.03 mg/dL Final         Passed - Completed PHQ-2 or PHQ-9 in the last 360 days      Passed - Valid encounter within last 12 months    Recent Outpatient Visits           5 months ago Need for influenza vaccination   [facility] [place], Coralie Keens, NP   3 years ago Cough    Primary Care at [facility], Arlie Solomons, MD   3 years ago Essential hypertension   Primary Care at [facility], Arlie Solomons, MD   3 years ago Attention deficit hyperactivity disorder (ADHD), combined type   Primary Care at [facility], [place] A, MD   4 years ago Idiopathic chronic pancreatitis [facility])   Primary Care at [facility], Laurey Arrow, MD               traMADol (ULTRAM) 50 MG tablet [Pharmacy Med Name: TRAMADOL 50MG TABLETS] 90 tablet     Sig: TAKE 1 TABLET(50 MG) BY MOUTH THREE TIMES DAILY AS NEEDED     Not Delegated - Analgesics:  Opioid Agonists Failed - 11/20/2022  9:17 AM      Failed - This refill cannot be delegated      Failed - Urine Drug Screen completed in last 360 days      Failed - Valid encounter within last 3 months    Recent Outpatient Visits  5 months ago Need for influenza vaccination   [facility] [place], Coralie Keens, NP   3 years ago Cough   Primary Care at [facility], Arlie Solomons, MD   3 years ago Essential hypertension   Primary Care at [facility], Arlie Solomons, MD   3 years ago Attention deficit hyperactivity disorder (ADHD), combined type   Primary Care at [facility], [place] A, MD   4 years ago Idiopathic chronic pancreatitis [facility])   Primary Care at [facility], Laurey Arrow, MD              Signed Prescriptions Disp Refills   albuterol (VENTOLIN HFA) 108 (90 Base) MCG/ACT inhaler 8.5 g 1    Sig: INHALE 2 PUFFS BY MOUTH EVERY 6 HOURS AS NEEDED     Pulmonology:  Beta Agonists 2 Failed - 11/20/2022  9:17 AM      Failed - Last Heart Rate in normal range    Pulse Readings from Last 1 Encounters:  06/17/22 (!) 115         Passed - Last BP in normal range    BP Readings from Last 1 Encounters:  06/17/22 134/72         Passed - Valid encounter within last 12 months    Recent Outpatient Visits           5 months ago Need for influenza vaccination   [facility] [place], Coralie Keens, NP    3 years ago Cough   Primary Care at [facility], Arlie Solomons, MD   3 years ago Essential hypertension   Primary Care at [place], MD   3 years ago Attention deficit hyperactivity disorder (ADHD), combined type   Primary Care at [place], MD   4 years ago Idiopathic chronic pancreatitis [facility])   Primary Care at [facility], Laurey Arrow, MD

## 2022-11-22 NOTE — Telephone Encounter (Signed)
Requested Prescriptions  Pending Prescriptions Disp Refills   carisoprodol (SOMA) 350 MG tablet [Pharmacy Med Name: CARISOPRODOL 350MG TABLETS] 90 tablet     Sig: TAKE 1 TABLET BY MOUTH FOUR TIMES DAILY AS NEEDED FOR MUSCLE SPASMS     Not Delegated - Analgesics:  Muscle Relaxants Failed - 11/20/2022  9:17 AM      Failed - This refill cannot be delegated      Passed - Valid encounter within last 6 months    Recent Outpatient Visits           5 months ago Need for influenza vaccination   [facility], Coralie Keens, NP   3 years ago Cough   Primary Care at [facility], Arlie Solomons, MD   3 years ago Essential hypertension   Primary Care at [facility], [place], MD   3 years ago Attention deficit hyperactivity disorder (ADHD), combined type   Primary Care at [facility], [place] A, MD   4 years ago Idiopathic chronic pancreatitis [facility])   Primary Care at [facility], Laurey Arrow, MD               pregabalin (LYRICA) 150 MG capsule [Pharmacy Med Name: PREGABALIN 150MG CAPSULES] 90 capsule     Sig: TAKE 1 CAPSULE(150 MG) BY MOUTH THREE TIMES DAILY     Not Delegated - Neurology:  Anticonvulsants - Controlled - pregabalin Failed - 11/20/2022  9:17 AM      Failed - This refill cannot be delegated      Passed - Cr in normal range and within 360 days    Creat  Date Value Ref Range Status  06/17/2022 0.98 0.50 - 1.03 mg/dL Final         Passed - Completed PHQ-2 or PHQ-9 in the last 360 days      Passed - Valid encounter within last 12 months    Recent Outpatient Visits           5 months ago Need for influenza vaccination   [facility] [place], Coralie Keens, NP   3 years ago Cough   Primary Care at [facility], Arlie Solomons, MD   3 years ago Essential hypertension   Primary Care at [facility], Arlie Solomons, MD   3 years ago Attention deficit hyperactivity disorder (ADHD), combined type   Primary Care at [facility], Arlie Solomons, MD    4 years ago Idiopathic chronic pancreatitis [facility])   Primary Care at [facility], Laurey Arrow, MD               traMADol (ULTRAM) 50 MG tablet [Pharmacy Med Name: TRAMADOL 50MG TABLETS] 90 tablet     Sig: TAKE 1 TABLET(50 MG) BY MOUTH THREE TIMES DAILY AS NEEDED     Not Delegated - Analgesics:  Opioid Agonists Failed - 11/20/2022  9:17 AM      Failed - This refill cannot be delegated      Failed - Urine Drug Screen completed in last 360 days      Failed - Valid encounter within last 3 months    Recent Outpatient Visits           5 months ago Need for influenza vaccination   [place], Coralie Keens, NP   3 years ago Cough   [facility] at [facility], Arlie Solomons, MD   3 years ago Essential hypertension   Primary  Care at [facility], Arlie Solomons, MD   3 years ago Attention deficit hyperactivity disorder (ADHD), combined type   Primary Care at [facility], [place] A, MD   4 years ago Idiopathic chronic pancreatitis [facility])   Primary Care at [facility], Laurey Arrow, MD               albuterol (VENTOLIN HFA) 108 (90 Base) MCG/ACT inhaler [Pharmacy Med Name: ALBUTEROL HFA INH (200 PUFFS) 8.5GM] 8.5 g 1    Sig: INHALE 2 PUFFS BY MOUTH EVERY 6 HOURS AS NEEDED     Pulmonology:  Beta Agonists 2 Failed - 11/20/2022  9:17 AM      Failed - Last Heart Rate in normal range    Pulse Readings from Last 1 Encounters:  06/17/22 (!) 115         Passed - Last BP in normal range    BP Readings from Last 1 Encounters:  06/17/22 134/72         Passed - Valid encounter within last 12 months    Recent Outpatient Visits           5 months ago Need for influenza vaccination   [facility] [place], Coralie Keens, NP   3 years ago Cough   Primary Care at [facility], Arlie Solomons, MD   3 years ago Essential hypertension   Primary Care at [place], MD   3 years ago Attention deficit hyperactivity disorder (ADHD), combined type    Primary Care at [place], MD   4 years ago Idiopathic chronic pancreatitis [facility])   Primary Care at [facility], Laurey Arrow, MD

## 2022-12-16 ENCOUNTER — Other Ambulatory Visit: Payer: Self-pay | Admitting: Internal Medicine

## 2022-12-16 DIAGNOSIS — G894 Chronic pain syndrome: Secondary | ICD-10-CM

## 2022-12-16 DIAGNOSIS — M797 Fibromyalgia: Secondary | ICD-10-CM

## 2022-12-16 NOTE — Telephone Encounter (Signed)
Requested medication (s) are due for refill today: Due 12/22/22  Requested medication (s) are on the active medication list: yes    Last refill: 11/23/22  #90  0 refills   all meds  Future visit scheduled no  Notes to clinic: Not delegated, please review.Thank you.  Requested Prescriptions  Pending Prescriptions Disp Refills   carisoprodol (SOMA) 350 MG tablet [Pharmacy Med Name: CARISOPRODOL '350MG'$  TABLETS] 90 tablet     Sig: TAKE 1 TABLET BY MOUTH FOUR TIMES DAILY AS NEEDED FOR MUSCLE SPASMS     Not Delegated - Analgesics:  Muscle Relaxants Failed - 12/16/2022  4:02 PM      Failed - This refill cannot be delegated      Failed - Valid encounter within last 6 months    Recent Outpatient Visits           6 months ago Need for influenza vaccination   [facility] [place], Coralie Keens, NP   3 years ago Cough   Primary Care at [facility], Arlie Solomons, MD   3 years ago Essential hypertension   Primary Care at [facility], [place], MD   3 years ago Attention deficit hyperactivity disorder (ADHD), combined type   Primary Care at [facility], Arlie Solomons, MD   4 years ago Idiopathic chronic pancreatitis [facility])   Primary Care at [facility], Laurey Arrow, MD               pregabalin (LYRICA) 150 MG capsule [Pharmacy Med Name: PREGABALIN '150MG'$  CAPSULES] 90 capsule     Sig: TAKE 1 CAPSULE(150 MG) BY MOUTH THREE TIMES DAILY     Not Delegated - Neurology:  Anticonvulsants - Controlled - pregabalin Failed - 12/16/2022  4:02 PM      Failed - This refill cannot be delegated      Passed - Cr in normal range and within 360 days    Creat  Date Value Ref Range Status  06/17/2022 0.98 0.50 - 1.03 mg/dL Final         Passed - Completed PHQ-2 or PHQ-9 in the last 360 days      Passed - Valid encounter within last 12 months    Recent Outpatient Visits           6 months ago Need for influenza vaccination   [facility] [place], Coralie Keens, NP   3  years ago Cough   Primary [facility] at [facility], Arlie Solomons, MD   3 years ago Essential hypertension   Primary Care at [facility], Arlie Solomons, MD   3 years ago Attention deficit hyperactivity disorder (ADHD), combined type   Primary Care at [facility], Arlie Solomons, MD   4 years ago Idiopathic chronic pancreatitis [facility])   Primary Care at [facility], Laurey Arrow, MD               traMADol (ULTRAM) 50 MG tablet [Pharmacy Med Name: TRAMADOL '50MG'$  TABLETS] 90 tablet     Sig: TAKE 1 TABLET(50 MG) BY MOUTH THREE TIMES DAILY AS NEEDED     Not Delegated - Analgesics:  Opioid Agonists Failed - 12/16/2022  4:02 PM      Failed - This refill cannot be delegated      Failed - Urine Drug Screen completed in last 360 days      Failed - Valid encounter within last 3 months    Recent Outpatient Visits  6 months ago Need for influenza vaccination   [facility] [place], Coralie Keens, NP   3 years ago Cough   Primary Care at [facility], Arlie Solomons, MD   3 years ago Essential hypertension   Primary Care at [facility], [place] A, MD   3 years ago Attention deficit hyperactivity disorder (ADHD), combined type   Primary [facility] at [facility], [place] A, MD   4 years ago Idiopathic chronic pancreatitis [facility])   Primary Care at [facility], Laurey Arrow, MD              Signed Prescriptions Disp Refills   esomeprazole (NEXIUM) 40 MG capsule 90 capsule 1    Sig: TAKE 1 CAPSULE(40 MG) BY MOUTH DAILY     Gastroenterology: Proton Pump Inhibitors 2 Passed - 12/16/2022  4:02 PM      Passed - ALT in normal range and within 360 days    ALT  Date Value Ref Range Status  06/17/2022 12 6 - 29 U/L Final         Passed - AST in normal range and within 360 days    AST  Date Value Ref Range Status  06/17/2022 14 10 - 35 U/L Final         Passed - Valid encounter within last 12 months    Recent Outpatient Visits           6 months ago Need for influenza vaccination   [facility] [place], Coralie Keens, NP   3 years ago Cough   [facility] at [facility], Arlie Solomons, MD   3 years ago Essential hypertension   Primary Care at [place], MD   3 years ago Attention deficit hyperactivity disorder (ADHD), combined type   Primary Care at [place], MD   4 years ago Idiopathic chronic pancreatitis [facility])   Primary Care at [facility], Laurey Arrow, MD

## 2022-12-16 NOTE — Telephone Encounter (Signed)
Requested Prescriptions  Pending Prescriptions Disp Refills   carisoprodol (SOMA) 350 MG tablet [Pharmacy Med Name: CARISOPRODOL '350MG'$  TABLETS] 90 tablet     Sig: TAKE 1 TABLET BY MOUTH FOUR TIMES DAILY AS NEEDED FOR MUSCLE SPASMS     Not Delegated - Analgesics:  Muscle Relaxants Failed - 12/16/2022  4:02 PM      Failed - This refill cannot be delegated      Failed - Valid encounter within last 6 months    Recent Outpatient Visits           6 months ago Need for influenza vaccination   [facility] [place], Coralie Keens, NP   3 years ago Cough   Primary Care at [facility], Arlie Solomons, MD   3 years ago Essential hypertension   Primary Care at [facility], [place], MD   3 years ago Attention deficit hyperactivity disorder (ADHD), combined type   [facility] at [facility], Arlie Solomons, MD   4 years ago Idiopathic chronic pancreatitis [facility])   Primary Care at [facility], Laurey Arrow, MD               pregabalin (LYRICA) 150 MG capsule [Pharmacy Med Name: PREGABALIN '150MG'$  CAPSULES] 90 capsule     Sig: TAKE 1 CAPSULE(150 MG) BY MOUTH THREE TIMES DAILY     Not Delegated - Neurology:  Anticonvulsants - Controlled - pregabalin Failed - 12/16/2022  4:02 PM      Failed - This refill cannot be delegated      Passed - Cr in normal range and within 360 days    Creat  Date Value Ref Range Status  06/17/2022 0.98 0.50 - 1.03 mg/dL Final         Passed - Completed PHQ-2 or PHQ-9 in the last 360 days      Passed - Valid encounter within last 12 months    Recent Outpatient Visits           6 months ago Need for influenza vaccination   [facility] [place], Coralie Keens, NP   3 years ago Cough   [facility] at [facility], Arlie Solomons, MD   3 years ago Essential hypertension   Primary Care at [facility], Arlie Solomons, MD   3 years ago Attention deficit hyperactivity disorder (ADHD), combined type   Primary [facility] at [facility], Arlie Solomons, MD    4 years ago Idiopathic chronic pancreatitis [facility])   Primary Care at [facility], Laurey Arrow, MD               traMADol (ULTRAM) 50 MG tablet [Pharmacy Med Name: TRAMADOL '50MG'$  TABLETS] 90 tablet     Sig: TAKE 1 TABLET(50 MG) BY MOUTH THREE TIMES DAILY AS NEEDED     Not Delegated - Analgesics:  Opioid Agonists Failed - 12/16/2022  4:02 PM      Failed - This refill cannot be delegated      Failed - Urine Drug Screen completed in last 360 days      Failed - Valid encounter within last 3 months    Recent Outpatient Visits           6 months ago Need for influenza vaccination   [place], Coralie Keens, NP   3 years ago Cough   [facility] at [facility], Arlie Solomons, MD   3 years ago Essential hypertension   Primary  Care at [facility] [place], MD   3 years ago Attention deficit hyperactivity disorder (ADHD), combined type   Primary [facility] at [facility], [place] A, MD   4 years ago Idiopathic chronic pancreatitis [facility])   Primary Care at [facility], Laurey Arrow, MD               esomeprazole ([place]) 40 MG capsule [Pharmacy Med Name: ESOMEPRAZOLE MAGNESIUM '40MG'$  DR CAPS] 90 capsule 1    Sig: TAKE 1 CAPSULE(40 MG) BY MOUTH DAILY     Gastroenterology: Proton Pump Inhibitors 2 Passed - 12/16/2022  4:02 PM      Passed - ALT in normal range and within 360 days    ALT  Date Value Ref Range Status  06/17/2022 12 6 - 29 U/L Final         Passed - AST in normal range and within 360 days    AST  Date Value Ref Range Status  06/17/2022 14 10 - 35 U/L Final         Passed - Valid encounter within last 12 months    Recent Outpatient Visits           6 months ago Need for influenza vaccination   [facility] [place], Coralie Keens, NP   3 years ago Cough   Primary Care at [facility], Arlie Solomons, MD   3 years ago Essential hypertension   Primary Care at [place], MD   3 years ago Attention deficit hyperactivity  disorder (ADHD), combined type   Primary Care at [place], MD   4 years ago Idiopathic chronic pancreatitis [facility])   Primary Care at [facility], Laurey Arrow, MD

## 2023-01-07 ENCOUNTER — Ambulatory Visit: Payer: 59 | Admitting: Internal Medicine

## 2023-01-07 ENCOUNTER — Encounter: Payer: Self-pay | Admitting: Internal Medicine

## 2023-01-07 VITALS — BP 122/80 | HR 105 | Temp 95.9°F | Wt 282.0 lb

## 2023-01-07 DIAGNOSIS — Z79899 Other long term (current) drug therapy: Secondary | ICD-10-CM

## 2023-01-07 DIAGNOSIS — Z0001 Encounter for general adult medical examination with abnormal findings: Secondary | ICD-10-CM

## 2023-01-07 DIAGNOSIS — Z23 Encounter for immunization: Secondary | ICD-10-CM

## 2023-01-07 DIAGNOSIS — E039 Hypothyroidism, unspecified: Secondary | ICD-10-CM | POA: Diagnosis not present

## 2023-01-07 DIAGNOSIS — M797 Fibromyalgia: Secondary | ICD-10-CM

## 2023-01-07 DIAGNOSIS — I10 Essential (primary) hypertension: Secondary | ICD-10-CM

## 2023-01-07 DIAGNOSIS — Z124 Encounter for screening for malignant neoplasm of cervix: Secondary | ICD-10-CM

## 2023-01-07 DIAGNOSIS — Z1231 Encounter for screening mammogram for malignant neoplasm of breast: Secondary | ICD-10-CM

## 2023-01-07 DIAGNOSIS — R7303 Prediabetes: Secondary | ICD-10-CM | POA: Diagnosis not present

## 2023-01-07 DIAGNOSIS — G894 Chronic pain syndrome: Secondary | ICD-10-CM

## 2023-01-07 DIAGNOSIS — G43809 Other migraine, not intractable, without status migrainosus: Secondary | ICD-10-CM

## 2023-01-07 DIAGNOSIS — Z780 Asymptomatic menopausal state: Secondary | ICD-10-CM

## 2023-01-07 MED ORDER — CARISOPRODOL 350 MG PO TABS: ORAL_TABLET | ORAL | 0 refills | Status: DC

## 2023-01-07 MED ORDER — LEVOTHYROXINE SODIUM 75 MCG PO TABS: 75.0000 ug | ORAL_TABLET | Freq: Every day | ORAL | 1 refills | Status: DC

## 2023-01-07 MED ORDER — ESOMEPRAZOLE MAGNESIUM 40 MG PO CPDR: DELAYED_RELEASE_CAPSULE | ORAL | 1 refills | Status: DC

## 2023-01-07 MED ORDER — BUDESONIDE-FORMOTEROL FUMARATE 160-4.5 MCG/ACT IN AERO: 2.0000 | INHALATION_SPRAY | Freq: Two times a day (BID) | RESPIRATORY_TRACT | 1 refills | Status: DC

## 2023-01-07 MED ORDER — MONTELUKAST SODIUM 10 MG PO TABS: 10.0000 mg | ORAL_TABLET | Freq: Every morning | ORAL | 1 refills | Status: DC

## 2023-01-07 MED ORDER — PANCRELIPASE (LIP-PROT-AMYL) 36000-114000 UNITS PO CPEP: ORAL_CAPSULE | ORAL | 5 refills | Status: DC

## 2023-01-07 MED ORDER — CITALOPRAM HYDROBROMIDE 20 MG PO TABS: 20.0000 mg | ORAL_TABLET | Freq: Every day | ORAL | 1 refills | Status: DC

## 2023-01-07 MED ORDER — SUMATRIPTAN SUCCINATE 100 MG PO TABS: ORAL_TABLET | ORAL | 5 refills | Status: AC

## 2023-01-07 MED ORDER — TRAMADOL HCL 50 MG PO TABS: ORAL_TABLET | ORAL | 0 refills | Status: DC

## 2023-01-07 MED ORDER — TELMISARTAN-HCTZ 80-25 MG PO TABS: 1.0000 | ORAL_TABLET | Freq: Every day | ORAL | 1 refills | Status: DC

## 2023-01-07 MED ORDER — PREGABALIN 150 MG PO CAPS: ORAL_CAPSULE | ORAL | 0 refills | Status: DC

## 2023-01-07 NOTE — Patient Instructions (Signed)
Health Maintenance for Postmenopausal Women Menopause is a normal process in which your ability to get pregnant comes to an end. This process happens slowly over many months or years, usually between the ages of 48 and 55. Menopause is complete when you have missed your menstrual period for 12 months. It is important to talk with your health care provider about some of the most common conditions that affect women after menopause (postmenopausal women). These include heart disease, cancer, and bone loss (osteoporosis). Adopting a healthy lifestyle and getting preventive care can help to promote your health and wellness. The actions you take can also lower your chances of developing some of these common conditions. What are the signs and symptoms of menopause? During menopause, you may have the following symptoms: Hot flashes. These can be moderate or severe. Night sweats. Decrease in sex drive. Mood swings. Headaches. Tiredness (fatigue). Irritability. Memory problems. Problems falling asleep or staying asleep. Talk with your health care provider about treatment options for your symptoms. Do I need hormone replacement therapy? Hormone replacement therapy is effective in treating symptoms that are caused by menopause, such as hot flashes and night sweats. Hormone replacement carries certain risks, especially as you become older. If you are thinking about using estrogen or estrogen with progestin, discuss the benefits and risks with your health care provider. How can I reduce my risk for heart disease and stroke? The risk of heart disease, heart attack, and stroke increases as you age. One of the causes may be a change in the body's hormones during menopause. This can affect how your body uses dietary fats, triglycerides, and cholesterol. Heart attack and stroke are medical emergencies. There are many things that you can do to help prevent heart disease and stroke. Watch your blood pressure High  blood pressure causes heart disease and increases the risk of stroke. This is more likely to develop in people who have high blood pressure readings or are overweight. Have your blood pressure checked: Every 3-5 years if you are 18-39 years of age. Every year if you are 40 years old or older. Eat a healthy diet  Eat a diet that includes plenty of vegetables, fruits, low-fat dairy products, and lean protein. Do not eat a lot of foods that are high in solid fats, added sugars, or sodium. Get regular exercise Get regular exercise. This is one of the most important things you can do for your health. Most adults should: Try to exercise for at least 150 minutes each week. The exercise should increase your heart rate and make you sweat (moderate-intensity exercise). Try to do strengthening exercises at least twice each week. Do these in addition to the moderate-intensity exercise. Spend less time sitting. Even light physical activity can be beneficial. Other tips Work with your health care provider to achieve or maintain a healthy weight. Do not use any products that contain nicotine or tobacco. These products include cigarettes, chewing tobacco, and vaping devices, such as e-cigarettes. If you need help quitting, ask your health care provider. Know your numbers. Ask your health care provider to check your cholesterol and your blood sugar (glucose). Continue to have your blood tested as directed by your health care provider. Do I need screening for cancer? Depending on your health history and family history, you may need to have cancer screenings at different stages of your life. This may include screening for: Breast cancer. Cervical cancer. Lung cancer. Colorectal cancer. What is my risk for osteoporosis? After menopause, you may be   at increased risk for osteoporosis. Osteoporosis is a condition in which bone destruction happens more quickly than new bone creation. To help prevent osteoporosis or  the bone fractures that can happen because of osteoporosis, you may take the following actions: If you are 19-50 years old, get at least 1,000 mg of calcium and at least 600 international units (IU) of vitamin D per day. If you are older than age 50 but younger than age 70, get at least 1,200 mg of calcium and at least 600 international units (IU) of vitamin D per day. If you are older than age 70, get at least 1,200 mg of calcium and at least 800 international units (IU) of vitamin D per day. Smoking and drinking excessive alcohol increase the risk of osteoporosis. Eat foods that are rich in calcium and vitamin D, and do weight-bearing exercises several times each week as directed by your health care provider. How does menopause affect my mental health? Depression may occur at any age, but it is more common as you become older. Common symptoms of depression include: Feeling depressed. Changes in sleep patterns. Changes in appetite or eating patterns. Feeling an overall lack of motivation or enjoyment of activities that you previously enjoyed. Frequent crying spells. Talk with your health care provider if you think that you are experiencing any of these symptoms. General instructions See your health care provider for regular wellness exams and vaccines. This may include: Scheduling regular health, dental, and eye exams. Getting and maintaining your vaccines. These include: Influenza vaccine. Get this vaccine each year before the flu season begins. Pneumonia vaccine. Shingles vaccine. Tetanus, diphtheria, and pertussis (Tdap) booster vaccine. Your health care provider may also recommend other immunizations. Tell your health care provider if you have ever been abused or do not feel safe at home. Summary Menopause is a normal process in which your ability to get pregnant comes to an end. This condition causes hot flashes, night sweats, decreased interest in sex, mood swings, headaches, or lack  of sleep. Treatment for this condition may include hormone replacement therapy. Take actions to keep yourself healthy, including exercising regularly, eating a healthy diet, watching your weight, and checking your blood pressure and blood sugar levels. Get screened for cancer and depression. Make sure that you are up to date with all your vaccines. This information is not intended to replace advice given to you by your health care provider. Make sure you discuss any questions you have with your health care provider. Document Revised: 02/16/2021 Document Reviewed: 02/16/2021 Elsevier Patient Education  2023 Elsevier Inc.  

## 2023-01-07 NOTE — Assessment & Plan Note (Signed)
Encourage diet and exercise for weight loss 

## 2023-01-07 NOTE — Progress Notes (Signed)
Subjective:    Patient ID: Gloria Carter, female    DOB: 1966/03/07, 57 y.o.   MRN: [id number]  HPI  Patient presents to clinic today for her annual exam.  Flu: 06/2022 Tetanus: 11/2009 COVID: Never Pneumovax: 07/2008 Shingrix: Never Pap smear: > 5 years ago Mammogram: never Bone density: never Colon screening: never Vision screening: as needed Dentist: as needed  Diet: She does eat meat. She consumes fruits and veggies. She tries to avoid fried foods. She drinks mostly water, soda. Exercise: None  Review of Systems     Past Medical History:  Diagnosis Date   ADD (attention deficit disorder)    Allergy    ENVIRONMENTAL   Arthritis    knees   Asthma    Depression    Difficulty sleeping    Dysphagia    Fibromyalgia    GERD (gastroesophageal reflux disease)    Hernia of abdominal wall    Hyperlipidemia    Hypertension    IBS (irritable bowel syndrome)    Migraine    Numbness and tingling in hands    Obesity    Swelling of both ankles    Thyroid disease     Current Outpatient Medications  Medication Sig Dispense Refill   albuterol (VENTOLIN HFA) 108 (90 Base) MCG/ACT inhaler INHALE 2 PUFFS BY MOUTH EVERY 6 HOURS AS NEEDED 8.5 g 1   amphetamine-dextroamphetamine (ADDERALL) 10 MG tablet Take 1 tablet (10 mg total) by mouth 2 (two) times daily with a meal. 60 tablet 0   aspirin EC 81 MG tablet Take 1 tablet (81 mg total) by mouth daily. Swallow whole. 30 tablet 12   azelastine (OPTIVAR) 0.05 % ophthalmic solution PLACE TWO DROPS INTO BOTH EYES TWICE DAILY FOR DRY EYES 6 mL 3   b complex vitamins tablet Take 1 tablet by mouth daily.     Biotin 1000 MCG CHEW Chew 5,000 mcg by mouth.     budesonide-formoterol (SYMBICORT) 160-4.5 MCG/ACT inhaler Inhale 2 puffs into the lungs 2 (two) times daily. 1 Inhaler 12   Calcium-Magnesium-Zinc 167-83-8 MG TABS Take by mouth.     carisoprodol (SOMA) 350 MG tablet TAKE 1 TABLET BY MOUTH FOUR TIMES DAILY AS NEEDED FOR MUSCLE  SPASMS 90 tablet 0   cetirizine (ZYRTEC) 10 MG tablet Take 10 mg by mouth daily as needed for allergies.      cholecalciferol (VITAMIN D) 400 UNITS TABS Take 400 Units by mouth daily.     citalopram (CELEXA) 20 MG tablet TAKE 1 TABLET BY MOUTH EVERY DAY 30 tablet 5   CREON 36000 units CPEP capsule TAKE 1 CAPSULES BY MOUTH AS DIRECTED 4 CAPS WITH A MEAL AND 1 CAP WITH SNACKS 390 capsule 11   esomeprazole (NEXIUM) 40 MG capsule TAKE 1 CAPSULE(40 MG) BY MOUTH DAILY 90 capsule 1   ipratropium (ATROVENT) 0.03 % nasal spray Place 2 sprays into the nose 4 (four) times daily as needed for rhinitis. 30 mL 0   levothyroxine (SYNTHROID) 75 MCG tablet Take 1 tablet (75 mcg total) by mouth daily before breakfast. ON EMPTY STOMACH WITH A SMALL GLASS OF WATER, NO MEDS OR FOODS FOR 20 MIN 90 tablet 3   montelukast (SINGULAIR) 10 MG tablet Take 1 tablet (10 mg total) by mouth every morning. 90 tablet 3   Multiple Vitamin (MULTIVITAMIN) tablet Take 1 tablet by mouth daily.     ondansetron (ZOFRAN) 8 MG tablet Take 8 mg by mouth every 4 (four) hours as needed.  pregabalin (LYRICA) 150 MG capsule TAKE 1 CAPSULE(150 MG) BY MOUTH THREE TIMES DAILY 90 capsule 0   SUMAtriptan (IMITREX) 100 MG tablet TAKE 1 TAB BY MOUTH EVERY 2 HOURS AS NEEDED FOR MIGRAINE. NO MORE THAN 2 DOSES IN 24 HOURS. 8 tablet 3   telmisartan-hydrochlorothiazide (MICARDIS HCT) 80-25 MG tablet Take 1 tablet by mouth daily. 90 tablet 3   traMADol (ULTRAM) 50 MG tablet TAKE 1 TABLET(50 MG) BY MOUTH THREE TIMES DAILY AS NEEDED 90 tablet 0   vitamin C (ASCORBIC ACID) 500 MG tablet Take 1,000 mg by mouth daily.     vitamin E 400 UNIT capsule Take 400 Units by mouth daily.     No current facility-administered medications for this visit.    Allergies  Allergen Reactions   Soap     Any soaps with fragrance cause migraines   Sulfa Antibiotics Nausea And Vomiting    Family History  Problem Relation Age of Onset   COPD Mother    Fibromyalgia  Mother    Heart disease Father    Colon polyps Father    Stroke Sister    Fibromyalgia Sister    Colon polyps Maternal Grandmother    Lung disease Paternal Grandmother    Stroke Paternal Grandmother    Diabetes Paternal Grandfather    Heart disease Paternal Grandfather    Colon polyps Maternal Aunt    Aortic aneurysm Maternal Aunt    Cystic kidney disease Maternal Aunt        kidney cyst   Kidney disease Maternal Aunt    Diabetes Maternal Uncle    Fibromyalgia Other        cousin   Colon cancer Neg Hx    Breast cancer Neg Hx     Social History   Socioeconomic History   Marital status: Divorced    Spouse name: Not on file   Number of children: 0   Years of education: Not on file   Highest education level: Not on file  Occupational History   Occupation: Education officer, museum    Employer: [employer]  Tobacco Use   Smoking status: Never   Smokeless tobacco: Never  Substance and Sexual Activity   Alcohol use: No    Alcohol/week: 0.0 standard drinks of alcohol   Drug use: No   Sexual activity: Not on file  Other Topics Concern   Not on file  Social History Narrative   Married. Education: [employer].    Social Determinants of Health   Financial Resource Strain: Not on file  Food Insecurity: Not on file  Transportation Needs: Not on file  Physical Activity: Not on file  Stress: Not on file  Social Connections: Not on file  Intimate Partner Violence: Not on file     Constitutional: Patient reports intermittent headaches, fatigue.  Denies fever, malaise, or abrupt weight changes.  HEENT: Denies eye pain, eye redness, ear pain, ringing in the ears, wax buildup, runny nose, nasal congestion, bloody nose, or sore throat. Respiratory: Patient reports cough and shortness of breath.  Denies difficulty breathing.   Cardiovascular: Denies chest pain, chest tightness, palpitations or swelling in the hands or feet.  Gastrointestinal: Patient reports intermittent diarrhea.  Denies  abdominal pain, bloating, constipation, or blood in the stool.  GU: Denies urgency, frequency, pain with urination, burning sensation, blood in urine, odor or discharge. Musculoskeletal: Patient reports chronic joint and muscle pain.  Denies decrease in range of motion, difficulty with gait, or joint swelling.  Skin: Denies redness, rashes,  lesions or ulcercations.  Neurological: Patient reports inattention.  Denies dizziness, difficulty with memory, difficulty with speech or problems with balance and coordination.  Psych: Patient has a history of anxiety and depression.  Denies SI/HI.  No other specific complaints in a complete review of systems (except as listed in HPI above).  Objective:   Physical Exam   BP 122/80 (BP Location: Right Wrist, Patient Position: Sitting, Cuff Size: Normal)   Pulse (!) 105   Temp (!) 95.9 F (35.5 C) (Temporal)   Wt 282 lb (127.9 kg)   SpO2 96%   BMI 51.58 kg/m   Wt Readings from Last 3 Encounters:  06/17/22 264 lb (119.7 kg)  01/05/22 280 lb 12.8 oz (127.4 kg)  04/29/20 274 lb 4 oz (124.4 kg)    General: Appears her stated age, obese, in NAD. Skin: Warm, dry and intact.  HEENT: Head: normal shape and size; Eyes: sclera white, no icterus, conjunctiva pink, PERRLA and EOMs intact;  Neck:  Neck supple, trachea midline. No masses, lumps present.  Thyromegaly noted. Cardiovascular: Tachycardic with normal rhythm. S1,S2 noted.  No murmur, rubs or gallops noted. No JVD or BLE edema. No carotid bruits noted. Pulmonary/Chest: Normal effort and positive vesicular breath sounds. No respiratory distress. No wheezes, rales or ronchi noted.  Abdomen: Normal bowel sounds.  Musculoskeletal: Gait slow and steady with use of walker. Neurological: Alert and oriented. Cranial nerves II-XII grossly intact. Coordination normal.  Psychiatric: Mood and affect flat. Behavior is normal. Judgment and thought content normal.     BMET    Component Value Date/Time   NA  138 06/17/2022 1052   NA 142 01/05/2019 1601   K 3.2 (L) 06/17/2022 1052   CL 96 (L) 06/17/2022 1052   CO2 29 06/17/2022 1052   GLUCOSE 99 06/17/2022 1052   BUN 25 06/17/2022 1052   BUN 12 01/05/2019 1601   CREATININE 0.98 06/17/2022 1052   CALCIUM 9.1 06/17/2022 1052   GFRNONAA >60 03/28/2020 1052   GFRNONAA >89 08/23/2014 1707   GFRAA >60 03/28/2020 1052   GFRAA >89 08/23/2014 1707    Lipid Panel     Component Value Date/Time   CHOL 268 (H) 06/17/2022 1052   CHOL 221 (H) 01/05/2019 1601   TRIG 153 (H) 06/17/2022 1052   HDL 55 06/17/2022 1052   HDL 41 01/05/2019 1601   CHOLHDL 4.9 06/17/2022 1052   VLDL 33 08/31/2013 0918   LDLCALC 183 (H) 06/17/2022 1052    CBC    Component Value Date/Time   WBC 11.9 (H) 06/17/2022 1052   RBC 4.46 06/17/2022 1052   HGB 13.9 06/17/2022 1052   HGB 12.8 06/15/2017 1722   HCT 40.8 06/17/2022 1052   HCT 38.2 06/15/2017 1722   PLT 233 06/17/2022 1052   PLT 216 06/15/2017 1722   MCV 91.5 06/17/2022 1052   MCV 90.5 01/23/2019 1400   MCV 88 06/15/2017 1722   MCH 31.2 06/17/2022 1052   MCHC 34.1 06/17/2022 1052   RDW 12.5 06/17/2022 1052   RDW 13.9 06/15/2017 1722   LYMPHSABS 2.1 03/28/2020 1052   LYMPHSABS 2.1 06/15/2017 1722   MONOABS 0.9 03/28/2020 1052   EOSABS 0.3 03/28/2020 1052   EOSABS 0.2 06/15/2017 1722   BASOSABS 0.1 03/28/2020 1052   BASOSABS 0.0 06/15/2017 1722    Hgb A1C Lab Results  Component Value Date   HGBA1C 5.6 06/17/2022           Assessment & Plan:   Preventative Health Maintenance:  Encouraged her to get a flu shot in the fall She declines tetanus for financial reasons, advised if she gets bit or cut to get this done Will give her a Pneumovax at her next visit Encouraged her to get her COVID-vaccine Discussed Shingrix vaccine, she will check coverage with her insurance company and schedule a visit if she would like to have this done Referral to GYN for referral Pap smear Mammogram was ordered  previously, she just needs to call and schedule this Bone density ordered-she will call to schedule Cologuard was sent to her house, advised her to complete this Encouraged her to consume a balanced diet and exercise regimen Asked her to see an eye doctor and dentist annually We will check CBC, c-Met, TSH, Free T4, lipid, A1c today  RTC in 6 months, follow-up chronic conditions Webb Silversmith, NP

## 2023-01-07 NOTE — Addendum Note (Signed)
Addended by: Kizzie Furnish on: 01/07/2023 03:35 PM   Modules accepted: Orders

## 2023-01-09 LAB — COMPLETE METABOLIC PANEL WITH GFR
AG Ratio: 1.3 (calc) (ref 1.0–2.5)
ALT: 10 U/L (ref 6–29)
AST: 12 U/L (ref 10–35)
Albumin: 4 g/dL (ref 3.6–5.1)
Alkaline phosphatase (APISO): 79 U/L (ref 37–153)
BUN: 18 mg/dL (ref 7–25)
CO2: 34 mmol/L — ABNORMAL HIGH (ref 20–32)
Calcium: 9.1 mg/dL (ref 8.6–10.4)
Chloride: 93 mmol/L — ABNORMAL LOW (ref 98–110)
Creat: 0.86 mg/dL (ref 0.50–1.03)
Globulin: 3.1 g/dL (calc) (ref 1.9–3.7)
Glucose, Bld: 104 mg/dL — ABNORMAL HIGH (ref 65–99)
Potassium: 3.2 mmol/L — ABNORMAL LOW (ref 3.5–5.3)
Sodium: 139 mmol/L (ref 135–146)
Total Bilirubin: 0.5 mg/dL (ref 0.2–1.2)
Total Protein: 7.1 g/dL (ref 6.1–8.1)
eGFR: 78 mL/min/{1.73_m2} (ref >=60)

## 2023-01-09 LAB — T4, FREE: Free T4: 1.2 ng/dL (ref 0.8–1.8)

## 2023-01-09 LAB — LIPID PANEL
Cholesterol: 257 mg/dL — ABNORMAL HIGH (ref <200)
HDL: 57 mg/dL (ref >=50)
LDL Cholesterol (Calc): 170 mg/dL (calc) — ABNORMAL HIGH
Non-HDL Cholesterol (Calc): 200 mg/dL (calc) — ABNORMAL HIGH (ref <130)
Total CHOL/HDL Ratio: 4.5 (calc) (ref <5.0)
Triglycerides: 153 mg/dL — ABNORMAL HIGH (ref <150)

## 2023-01-09 LAB — DRUG MONITORING, PANEL 8 WITH CONFIRMATION, URINE
6 Acetylmorphine: NEGATIVE ng/mL (ref <10)
Alcohol Metabolites: NEGATIVE ng/mL (ref <500)
Amphetamine: 4881 ng/mL — ABNORMAL HIGH (ref <250)
Amphetamines: POSITIVE ng/mL — AB (ref <500)
Benzodiazepines: NEGATIVE ng/mL (ref <100)
Buprenorphine, Urine: NEGATIVE ng/mL (ref <5)
Cocaine Metabolite: NEGATIVE ng/mL (ref <150)
Codeine: NEGATIVE ng/mL (ref <50)
Creatinine: 262.5 mg/dL (ref >=20.0)
Hydrocodone: NEGATIVE ng/mL (ref <50)
Hydromorphone: NEGATIVE ng/mL (ref <50)
MDMA: NEGATIVE ng/mL (ref <500)
Marijuana Metabolite: NEGATIVE ng/mL (ref <20)
Methamphetamine: NEGATIVE ng/mL (ref <250)
Morphine: NEGATIVE ng/mL (ref <50)
Norhydrocodone: NEGATIVE ng/mL (ref <50)
Opiates: NEGATIVE ng/mL (ref <100)
Oxidant: NEGATIVE ug/mL (ref <200)
Oxycodone: NEGATIVE ng/mL (ref <100)
pH: 5.7 (ref 4.5–9.0)

## 2023-01-09 LAB — HEMOGLOBIN A1C
Hgb A1c MFr Bld: 5.9 % of total Hgb — ABNORMAL HIGH (ref <5.7)
Mean Plasma Glucose: 123 mg/dL
eAG (mmol/L): 6.8 mmol/L

## 2023-01-09 LAB — CBC
HCT: 36.2 % (ref 35.0–45.0)
Hemoglobin: 12.4 g/dL (ref 11.7–15.5)
MCH: 30.7 pg (ref 27.0–33.0)
MCHC: 34.3 g/dL (ref 32.0–36.0)
MCV: 89.6 fL (ref 80.0–100.0)
MPV: 12.1 fL (ref 7.5–12.5)
Platelets: 260 10*3/uL (ref 140–400)
RBC: 4.04 10*6/uL (ref 3.80–5.10)
RDW: 12.1 % (ref 11.0–15.0)
WBC: 12.7 10*3/uL — ABNORMAL HIGH (ref 3.8–10.8)

## 2023-01-09 LAB — DM TEMPLATE

## 2023-01-09 LAB — TSH: TSH: 2.61 mIU/L (ref 0.40–4.50)

## 2023-01-10 ENCOUNTER — Telehealth: Payer: Self-pay | Admitting: Internal Medicine

## 2023-01-10 NOTE — Telephone Encounter (Signed)
Pt called saying she had ask for a medication Creon-36000 unit capsul that was ask to go to [facility] [id number]   [phone]  She said she thought Rollene Fare was going to send it to the pharmacy. And she has not heard anything back.

## 2023-01-11 MED ORDER — PANCRELIPASE (LIP-PROT-AMYL) 36000-114000 UNITS PO CPEP: ORAL_CAPSULE | ORAL | 5 refills | Status: DC

## 2023-01-11 NOTE — Telephone Encounter (Signed)
I have sent this to [facility].  It was originally sent to her mail order pharmacy.   Thanks,   -Mickel Baas

## 2023-01-11 NOTE — Addendum Note (Signed)
Addended by: Ashley Royalty E on: 01/11/2023 09:44 AM   Modules accepted: Orders

## 2023-01-18 ENCOUNTER — Encounter: Payer: Self-pay | Admitting: Obstetrics and Gynecology

## 2023-02-14 ENCOUNTER — Other Ambulatory Visit: Payer: Self-pay | Admitting: Internal Medicine

## 2023-02-14 DIAGNOSIS — G894 Chronic pain syndrome: Secondary | ICD-10-CM

## 2023-02-14 DIAGNOSIS — M797 Fibromyalgia: Secondary | ICD-10-CM

## 2023-02-15 NOTE — Telephone Encounter (Signed)
Requested medications are due for refill today.  A little too soon  Requested medications are on the active medications list.  Yes - all 3  Last refill. 01/07/2023 90 day supply  Future visit scheduled.   yes  Notes to clinic.  Refills are not delegated.    Requested Prescriptions  Pending Prescriptions Disp Refills   carisoprodol (SOMA) 350 MG tablet [Pharmacy Med Name: CARISOPRODOL 350MG  TABLETS] 90 tablet     Sig: TAKE 1 TABLET BY MOUTH FOUR TIMES DAILY AS NEEDED FOR MUSCLE SPASMS     Not Delegated - Analgesics:  Muscle Relaxants Failed - 02/14/2023 10:49 AM      Failed - This refill cannot be delegated      Passed - Valid encounter within last 6 months    Recent Outpatient Visits           1 month ago Encounter for general adult medical examination with abnormal findings   [facility], Salvadore Oxford, NP   8 months ago Need for influenza vaccination   [place] [facility], Salvadore Oxford, NP   4 years ago Cough   [facility], Manus Rudd, MD   4 years ago Essential hypertension   [facility], Manus Rudd, MD   4 years ago Attention deficit hyperactivity disorder (ADHD), combined type   [facility] at [facility], Manus Rudd, MD       Future Appointments             In 4 months Baity, Salvadore Oxford, NP [facility]             pregabalin (LYRICA) 150 MG capsule [Pharmacy Med Name: PREGABALIN 150MG  CAPSULES] 90 capsule     Sig: TAKE 1 CAPSULE(150 MG) BY MOUTH THREE TIMES DAILY     Not Delegated - Neurology:  Anticonvulsants - Controlled - pregabalin Failed - 02/14/2023 10:49 AM      Failed - This refill cannot be delegated      Passed - Cr in normal range and within 360 days    Creat  Date Value Ref Range Status  01/07/2023 0.86 0.50 - 1.03 mg/dL Final         Passed - Completed PHQ-2 or PHQ-9 in the last 360 days      Passed - Valid encounter within  last 12 months    Recent Outpatient Visits           1 month ago Encounter for general adult medical examination with abnormal findings   [facility], Salvadore Oxford, NP   8 months ago Need for influenza vaccination   [facility], Salvadore Oxford, NP   4 years ago Cough   [facility], Manus Rudd, MD   4 years ago Essential hypertension   [facility] at [facility] [place], MD   4 years ago Attention deficit hyperactivity disorder (ADHD), combined type   [facility], Manus Rudd, MD       Future Appointments             In 4 months Baity, Salvadore Oxford, NP [facility]             traMADol (ULTRAM) 50 MG tablet [Pharmacy Med Name: TRAMADOL 50MG  TABLETS] 90 tablet     Sig: TAKE  1 TABLET(50 MG) BY MOUTH THREE TIMES DAILY AS NEEDED     Not Delegated - Analgesics:  Opioid Agonists Failed - 02/14/2023 10:49 AM      Failed - This refill cannot be delegated      Failed - Urine Drug Screen completed in last 360 days      Passed - Valid encounter within last 3 months    Recent Outpatient Visits           1 month ago Encounter for general adult medical examination with abnormal findings   [facility], Salvadore Oxford, NP   8 months ago Need for influenza vaccination   [place] [facility], Salvadore Oxford, NP   4 years ago Cough   [facility], Manus Rudd, MD   4 years ago Essential hypertension   [facility] at [facility], [place], MD   4 years ago Attention deficit hyperactivity disorder (ADHD), combined type   [facility], Manus Rudd, MD       Future Appointments             In 4 months Baity, Salvadore Oxford, NP [facility]

## 2023-03-29 ENCOUNTER — Other Ambulatory Visit: Payer: Self-pay | Admitting: Internal Medicine

## 2023-03-29 DIAGNOSIS — M797 Fibromyalgia: Secondary | ICD-10-CM

## 2023-03-29 DIAGNOSIS — G894 Chronic pain syndrome: Secondary | ICD-10-CM

## 2023-03-29 NOTE — Telephone Encounter (Signed)
Requested by interface surescripts. Future visit in 3 months. Requested Prescriptions  Pending Prescriptions Disp Refills   SYMBICORT 160-4.5 MCG/ACT inhaler [Pharmacy Med Name: SYMBICORT 160/4. (120 ORAL INH)] 10.2 g 0    Sig: INHALE 2 PUFFS BY MOUTH TWICE DAILY     Pulmonology:  Combination Products Passed - 03/29/2023  1:32 AM      Passed - Valid encounter within last 12 months    Recent Outpatient Visits           2 months ago Encounter for general adult medical examination with abnormal findings   [place] [place], Salvadore Oxford, NP   9 months ago Need for influenza vaccination   [place] [place], Salvadore Oxford, NP   4 years ago Cough   Primary [facility] at [facility], Manus Rudd, MD   4 years ago Essential hypertension   Primary Care at [facility], [place], MD   4 years ago Attention deficit hyperactivity disorder (ADHD), combined type   [facility] at [facility], Manus Rudd, MD       Future Appointments             In 3 months Baity, Salvadore Oxford, NP [facility]             albuterol (VENTOLIN HFA) 108 (90 Base) MCG/ACT inhaler [Pharmacy Med Name: ALBUTEROL HFA INH (200 PUFFS) 8.5GM] 8.5 g 1    Sig: INHALE 2 PUFFS BY MOUTH EVERY 6 HOURS AS NEEDED     Pulmonology:  Beta Agonists 2 Passed - 03/29/2023  1:32 AM      Passed - Last BP in normal range    BP Readings from Last 1 Encounters:  01/07/23 122/80         Passed - Last Heart Rate in normal range    Pulse Readings from Last 1 Encounters:  01/07/23 (!) 105         Passed - Valid encounter within last 12 months    Recent Outpatient Visits           2 months ago Encounter for general adult medical examination with abnormal findings   [facility] [place], Salvadore Oxford, NP   9 months ago Need for influenza vaccination   [place] [place], Salvadore Oxford, NP   4 years ago Cough    [facility], Manus Rudd, MD   4 years ago Essential hypertension   Primary [facility] at [facility], [place], MD   4 years ago Attention deficit hyperactivity disorder (ADHD), combined type   [facility] at [facility], Manus Rudd, MD       Future Appointments             In 3 months Baity, Salvadore Oxford, NP [facility]             carisoprodol (SOMA) 350 MG tablet [Pharmacy Med Name: CARISOPRODOL 350MG  TABLETS] 90 tablet     Sig: TAKE 1 TABLET BY MOUTH FOUR TIMES DAILY AS NEEDED FOR MUSCLE SPASMS     Not Delegated - Analgesics:  Muscle Relaxants Failed - 03/29/2023  1:32 AM      Failed - This refill cannot be delegated      Passed - Valid encounter within last 6 months    Recent Outpatient Visits  2 months ago Encounter for general adult medical examination with abnormal findings   [facility] [place], Salvadore Oxford, NP   9 months ago Need for influenza vaccination   [place] [facility] [place], Salvadore Oxford, NP   4 years ago Cough   Primary [facility] at [facility], Manus Rudd, MD   4 years ago Essential hypertension   Primary [facility] at [facility], Manus Rudd, MD   4 years ago Attention deficit hyperactivity disorder (ADHD), combined type   [facility] at [facility], Manus Rudd, MD       Future Appointments             In 3 months Baity, Salvadore Oxford, NP [facility]             pregabalin (LYRICA) 150 MG capsule [Pharmacy Med Name: PREGABALIN 150MG  CAPSULES] 90 capsule     Sig: TAKE 1 CAPSULE(150 MG) BY MOUTH THREE TIMES DAILY     Not Delegated - Neurology:  Anticonvulsants - Controlled - pregabalin Failed - 03/29/2023  1:32 AM      Failed - This refill cannot be delegated      Passed - Cr in normal range and within 360 days    Creat  Date Value Ref Range Status  01/07/2023 0.86 0.50 - 1.03 mg/dL Final         Passed - Completed PHQ-2 or  PHQ-9 in the last 360 days      Passed - Valid encounter within last 12 months    Recent Outpatient Visits           2 months ago Encounter for general adult medical examination with abnormal findings   [place] [facility] [place], Salvadore Oxford, NP   9 months ago Need for influenza vaccination   [facility] [place], Salvadore Oxford, NP   4 years ago Cough   [facility] at [facility], Manus Rudd, MD   4 years ago Essential hypertension   Primary [facility] at [facility], [place], MD   4 years ago Attention deficit hyperactivity disorder (ADHD), combined type   [facility] at [facility], Manus Rudd, MD       Future Appointments             In 3 months Baity, Salvadore Oxford, NP [place] [facility]             traMADol (ULTRAM) 50 MG tablet [Pharmacy Med Name: TRAMADOL 50MG  TABLETS] 90 tablet     Sig: TAKE 1 TABLET(50 MG) BY MOUTH THREE TIMES DAILY AS NEEDED     Not Delegated - Analgesics:  Opioid Agonists Failed - 03/29/2023  1:32 AM      Failed - This refill cannot be delegated      Failed - Urine Drug Screen completed in last 360 days      Passed - Valid encounter within last 3 months    Recent Outpatient Visits           2 months ago Encounter for general adult medical examination with abnormal findings   [place] [facility] [place], Salvadore Oxford, NP   9 months ago Need for influenza vaccination   [facility] [place], Salvadore Oxford, NP   4 years ago Cough   [facility] at [facility], Manus Rudd, MD   4 years  ago Essential hypertension   [facility] at [facility], Manus Rudd, MD   4 years ago Attention deficit hyperactivity disorder (ADHD), combined type   Primary [facility] at [facility], Manus Rudd, MD       Future Appointments             In 3 months Baity, Salvadore Oxford, NP [place]

## 2023-03-29 NOTE — Telephone Encounter (Signed)
Requested medication (s) are due for refill today: see request  Requested medication (s) are on the active medication list: yes   Last refill:  soma- 02/15/23/#90 0 refills, lyrica- 02/15/23 #90 0 refills, tramadol- 02/15/23 #90 0 refills  Future visit scheduled: yes in 3 months   Notes to clinic:  not delegated per protocol. Do you want to refill Rxs?     Requested Prescriptions  Pending Prescriptions Disp Refills   carisoprodol (SOMA) 350 MG tablet [Pharmacy Med Name: CARISOPRODOL 350MG  TABLETS] 90 tablet     Sig: TAKE 1 TABLET BY MOUTH FOUR TIMES DAILY AS NEEDED FOR MUSCLE SPASMS     Not Delegated - Analgesics:  Muscle Relaxants Failed - 03/29/2023  1:32 AM      Failed - This refill cannot be delegated      Passed - Valid encounter within last 6 months    Recent Outpatient Visits           2 months ago Encounter for general adult medical examination with abnormal findings   [place] [facility] [place], Salvadore Oxford, NP   9 months ago Need for influenza vaccination   [place] [facility] [place], Salvadore Oxford, NP   4 years ago Cough   [facility] at [facility], Manus Rudd, MD   4 years ago Essential hypertension   [facility] at [facility], Manus Rudd, MD   4 years ago Attention deficit hyperactivity disorder (ADHD), combined type   Primary [facility] at [facility], Manus Rudd, MD       Future Appointments             In 3 months Baity, Salvadore Oxford, NP [place] [facility]             pregabalin (LYRICA) 150 MG capsule [Pharmacy Med Name: PREGABALIN 150MG  CAPSULES] 90 capsule     Sig: TAKE 1 CAPSULE(150 MG) BY MOUTH THREE TIMES DAILY     Not Delegated - Neurology:  Anticonvulsants - Controlled - pregabalin Failed - 03/29/2023  1:32 AM      Failed - This refill cannot be delegated      Passed - Cr in normal range and within 360 days    Creat  Date Value Ref Range Status  01/07/2023 0.86 0.50 - 1.03 mg/dL Final          Passed - Completed PHQ-2 or PHQ-9 in the last 360 days      Passed - Valid encounter within last 12 months    Recent Outpatient Visits           2 months ago Encounter for general adult medical examination with abnormal findings   [place] [facility] [place], Salvadore Oxford, NP   9 months ago Need for influenza vaccination   [place] [facility] [place], Salvadore Oxford, NP   4 years ago Cough   [facility] at [facility], Manus Rudd, MD   4 years ago Essential hypertension   [facility] at [facility], Manus Rudd, MD   4 years ago Attention deficit hyperactivity disorder (ADHD), combined type   [facility] at [facility], Manus Rudd, MD       Future Appointments             In 3 months Baity, Salvadore Oxford, NP [facility]             traMADol (  ULTRAM) 50 MG tablet [Pharmacy Med Name: TRAMADOL 50MG  TABLETS] 90 tablet     Sig: TAKE 1 TABLET(50 MG) BY MOUTH THREE TIMES DAILY AS NEEDED     Not Delegated - Analgesics:  Opioid Agonists Failed - 03/29/2023  1:32 AM      Failed - This refill cannot be delegated      Failed - Urine Drug Screen completed in last 360 days      Passed - Valid encounter within last 3 months    Recent Outpatient Visits           2 months ago Encounter for general adult medical examination with abnormal findings   [place] [facility] [place], Salvadore Oxford, NP   9 months ago Need for influenza vaccination   [place] [facility] [place], Salvadore Oxford, NP   4 years ago Cough   [facility] at [facility], Manus Rudd, MD   4 years ago Essential hypertension   Primary Care at [facility], [place], MD   4 years ago Attention deficit hyperactivity disorder (ADHD), combined type   Primary [facility] at [facility], Manus Rudd, MD       Future Appointments             In 3 months Baity, Salvadore Oxford, NP [place] [facility], [facility]            Signed  Prescriptions Disp Refills   budesonide-formoterol (SYMBICORT) 160-4.5 MCG/ACT inhaler 10.2 g 0    Sig: INHALE 2 PUFFS BY MOUTH TWICE DAILY     Pulmonology:  Combination Products Passed - 03/29/2023  1:32 AM      Passed - Valid encounter within last 12 months    Recent Outpatient Visits           2 months ago Encounter for general adult medical examination with abnormal findings   [facility] [place], Salvadore Oxford, NP   9 months ago Need for influenza vaccination   [place] [place], Salvadore Oxford, NP   4 years ago Cough   [facility] at [facility], Manus Rudd, MD   4 years ago Essential hypertension   Primary Care at [facility], [place], MD   4 years ago Attention deficit hyperactivity disorder (ADHD), combined type   [facility] at [facility], Manus Rudd, MD       Future Appointments             In 3 months Baity, Salvadore Oxford, NP [place] [facility]             albuterol (VENTOLIN HFA) 108 (90 Base) MCG/ACT inhaler 8.5 g 1    Sig: INHALE 2 PUFFS BY MOUTH EVERY 6 HOURS AS NEEDED     Pulmonology:  Beta Agonists 2 Passed - 03/29/2023  1:32 AM      Passed - Last BP in normal range    BP Readings from Last 1 Encounters:  01/07/23 122/80         Passed - Last Heart Rate in normal range    Pulse Readings from Last 1 Encounters:  01/07/23 (!) 105         Passed - Valid encounter within last 12 months    Recent Outpatient Visits           2 months ago Encounter for general adult medical examination with abnormal findings  [facility] [place], Salvadore Oxford, NP   9 months ago Need for influenza vaccination   [facility] [place], Salvadore Oxford, NP   4 years ago Cough   Primary [facility] at [facility], Manus Rudd, MD   4 years ago Essential hypertension   [facility] at [facility], Manus Rudd, MD   4 years ago Attention deficit hyperactivity disorder  (ADHD), combined type   Primary Care at Pete Pelt, MD       Future Appointments             In 3 months Baity, Salvadore Oxford, NP [place] [facility], [facility]

## 2023-04-25 ENCOUNTER — Other Ambulatory Visit: Payer: Self-pay | Admitting: Internal Medicine

## 2023-04-25 DIAGNOSIS — M797 Fibromyalgia: Secondary | ICD-10-CM

## 2023-04-25 DIAGNOSIS — G894 Chronic pain syndrome: Secondary | ICD-10-CM

## 2023-04-27 NOTE — Telephone Encounter (Signed)
Requested medication (s) are due for refill today:   Provider to review  Requested medication (s) are on the active medication list:   Yes for both  Future visit scheduled:   Yes in 2 mo.   Last ordered: Symbicort 03/29/2023 10.2 g, 0 refills;   Soma 03/29/2023 #90, 0 refills  Returned because of it being non delegated.     Requested Prescriptions  Pending Prescriptions Disp Refills   SYMBICORT 160-4.5 MCG/ACT inhaler [Pharmacy Med Name: SYMBICORT 160/4. (120 ORAL INH)] 10.2 g 0    Sig: INHALE 2 PUFFS BY MOUTH TWICE DAILY     Pulmonology:  Combination Products Passed - 04/25/2023  6:38 PM      Passed - Valid encounter within last 12 months    Recent Outpatient Visits           3 months ago Encounter for general adult medical examination with abnormal findings   [place] [facility] [place], Salvadore Oxford, NP   10 months ago Need for influenza vaccination   [place] [place], Salvadore Oxford, NP   4 years ago Cough   [facility], Manus Rudd, MD   4 years ago Essential hypertension   Primary Care at [facility], [place], MD   4 years ago Attention deficit hyperactivity disorder (ADHD), combined type   Primary [facility] at [facility], Manus Rudd, MD       Future Appointments             In 2 months Baity, Salvadore Oxford, NP [place] [facility]             carisoprodol (SOMA) 350 MG tablet [Pharmacy Med Name: CARISOPRODOL 350MG  TABLETS] 90 tablet     Sig: TAKE 1 TABLET BY MOUTH FOUR TIMES DAILY AS NEEDED FOR MUSCLE SPASMS     Not Delegated - Analgesics:  Muscle Relaxants Failed - 04/25/2023  6:38 PM      Failed - This refill cannot be delegated      Passed - Valid encounter within last 6 months    Recent Outpatient Visits           3 months ago Encounter for general adult medical examination with abnormal findings   [place] [facility] [place], Salvadore Oxford, NP   10 months ago Need  for influenza vaccination   [place] [facility] [place], Salvadore Oxford, NP   4 years ago Cough   [facility] at [facility], Man[facility] Rudd, MD   4 years ago Essential hypertension   [facility] at [facility], Manus Rudd, MD   4 years ago Attention deficit hyperactivity disorder (ADHD), combined type   Primary [facility] at [facility], Manus Rudd, MD       Future Appointments             In 2 months Baity, Salvadore Oxford, NP [place] [facility] [place]

## 2023-05-30 ENCOUNTER — Other Ambulatory Visit: Payer: Self-pay | Admitting: Internal Medicine

## 2023-05-30 DIAGNOSIS — M797 Fibromyalgia: Secondary | ICD-10-CM

## 2023-05-30 DIAGNOSIS — G894 Chronic pain syndrome: Secondary | ICD-10-CM

## 2023-06-01 ENCOUNTER — Other Ambulatory Visit: Payer: Self-pay | Admitting: Internal Medicine

## 2023-06-01 DIAGNOSIS — M797 Fibromyalgia: Secondary | ICD-10-CM

## 2023-06-01 NOTE — Telephone Encounter (Signed)
Requested Prescriptions  Pending Prescriptions Disp Refills   carisoprodol (SOMA) 350 MG tablet [Pharmacy Med Name: CARISOPRODOL 350MG  TABLETS] 90 tablet     Sig: TAKE 1 TABLET BY MOUTH FOUR TIMES DAILY AS NEEDED FOR MUSCLE SPASMS     Not Delegated - Analgesics:  Muscle Relaxants Failed - 05/30/2023 12:14 PM      Failed - This refill cannot be delegated      Passed - Valid encounter within last 6 months    Recent Outpatient Visits           4 months ago Encounter for general adult medical examination with abnormal findings   [facility], Salvadore Oxford, NP   11 months ago Need for influenza vaccination   [place] [place], Salvadore Oxford, NP   4 years ago Cough   Primary [facility] at [facility], Manus Rudd, MD   4 years ago Essential hypertension   [facility] at [facility], [place], MD   4 years ago Attention deficit hyperactivity disorder (ADHD), combined type   Primary Care at [facility], Manus Rudd, MD       Future Appointments             In 5 days Baity, Salvadore Oxford, NP [place] [facility]   In 1 month [place], Salvadore Oxford, NP [place] [facility], [facility]             SYMBICORT 160-4.5 MCG/ACT inhaler [Pharmacy Med Name: SYMBICORT 160/4. (120 ORAL INH)] 10.2 g 0    Sig: INHALE 2 PUFFS BY MOUTH TWICE DAILY     Pulmonology:  Combination Products Passed - 05/30/2023 12:14 PM      Passed - Valid encounter within last 12 months    Recent Outpatient Visits           4 months ago Encounter for general adult medical examination with abnormal findings   [place] [facility] [place], Salvadore Oxford, NP   11 months ago Need for influenza vaccination   [place] [facility] [place], Salvadore Oxford, NP   4 years ago Cough   Primary [facility] at [facility], Manus Rudd, MD   4 years ago Essential hypertension   Primary [facility] at [facility], [place], MD   4 years ago  Attention deficit hyperactivity disorder (ADHD), combined type   Primary Care at [facility], Manus Rudd, MD       Future Appointments             In 5 days Baity, Salvadore Oxford, NP [place] [facility]   In 1 month [place], Salvadore Oxford, NP [facility], [facility]

## 2023-06-01 NOTE — Telephone Encounter (Signed)
Requested medication (s) are due for refill today: routing for review  Requested medication (s) are on the active medication list: yes  Last refill:  04/27/23  Future visit scheduled: yes  Notes to clinic:  Unable to refill per protocol, cannot delegate.      Requested Prescriptions  Pending Prescriptions Disp Refills   carisoprodol (SOMA) 350 MG tablet [Pharmacy Med Name: CARISOPRODOL 350MG  TABLETS] 90 tablet     Sig: TAKE 1 TABLET BY MOUTH FOUR TIMES DAILY AS NEEDED FOR MUSCLE SPASMS     Not Delegated - Analgesics:  Muscle Relaxants Failed - 05/30/2023 12:14 PM      Failed - This refill cannot be delegated      Passed - Valid encounter within last 6 months    Recent Outpatient Visits           4 months ago Encounter for general adult medical examination with abnormal findings   [place] [facility] [place], Salvadore Oxford, NP   11 months ago Need for influenza vaccination   [place] [facility] [place], Salvadore Oxford, NP   4 years ago Cough   Primary Care at [facility], Manus Rudd, MD   4 years ago Essential hypertension   Primary Care at [facility], [place], MD   4 years ago Attention deficit hyperactivity disorder (ADHD), combined type   Primary Care at [facility], Manus Rudd, MD       Future Appointments             In 5 days Baity, Salvadore Oxford, NP [facility], [facility]   In 1 month [place], Salvadore Oxford, NP [place] [facility], [facility]            Signed Prescriptions Disp Refills   SYMBICORT 160-4.5 MCG/ACT inhaler 10.2 g 0    Sig: INHALE 2 PUFFS BY MOUTH TWICE DAILY     Pulmonology:  Combination Products Passed - 05/30/2023 12:14 PM      Passed - Valid encounter within last 12 months    Recent Outpatient Visits           4 months ago Encounter for general adult medical examination with abnormal findings   [place] [facility] [place], Salvadore Oxford, NP   11 months ago Need for  influenza vaccination   [place] [facility] [place], Salvadore Oxford, NP   4 years ago Cough   Primary [facility] at [facility], Manus Rudd, MD   4 years ago Essential hypertension   Primary Care at [facility], [place], MD   4 years ago Attention deficit hyperactivity disorder (ADHD), combined type   Primary Care at [facility], Manus Rudd, MD       Future Appointments             In 5 days Baity, Salvadore Oxford, NP [facility], [facility]   In 1 month [place], Salvadore Oxford, NP [facility], [facility]

## 2023-06-02 NOTE — Telephone Encounter (Signed)
Requested medication (s) are due for refill today: yes  Requested medication (s) are on the active medication list: yes  Last refill:  03/29/23  Future visit scheduled: yes  Notes to clinic:  Unable to refill per protocol, cannot delegate.      Requested Prescriptions  Pending Prescriptions Disp Refills   pregabalin (LYRICA) 150 MG capsule [Pharmacy Med Name: PREGABALIN 150MG  CAPSULES] 90 capsule     Sig: TAKE 1 CAPSULE(150 MG) BY MOUTH THREE TIMES DAILY     Not Delegated - Neurology:  Anticonvulsants - Controlled - pregabalin Failed - 06/01/2023 11:28 AM      Failed - This refill cannot be delegated      Passed - Cr in normal range and within 360 days    Creat  Date Value Ref Range Status  01/07/2023 0.86 0.50 - 1.03 mg/dL Final         Passed - Completed PHQ-2 or PHQ-9 in the last 360 days      Passed - Valid encounter within last 12 months    Recent Outpatient Visits           4 months ago Encounter for general adult medical examination with abnormal findings   [place] [place], Salvadore Oxford, NP   11 months ago Need for influenza vaccination   [place] [facility] [place], Salvadore Oxford, NP   4 years ago Cough   Primary [facility] at [facility], Manus Rudd, MD   4 years ago Essential hypertension   Primary Care at [facility], [place], MD   4 years ago Attention deficit hyperactivity disorder (ADHD), combined type   Primary Care at [facility], Manus Rudd, MD       Future Appointments             In 4 days Baity, Salvadore Oxford, NP [place] [facility]   In 1 month [place], Salvadore Oxford, NP [facility]             traMADol (ULTRAM) 50 MG tablet [Pharmacy Med Name: TRAMADOL 50MG  TABLETS] 90 tablet     Sig: TAKE 1 TABLET(50 MG) BY MOUTH THREE TIMES DAILY AS NEEDED     Not Delegated - Analgesics:  Opioid Agonists Failed - 06/01/2023 11:28 AM      Failed - This refill cannot be  delegated      Failed - Urine Drug Screen completed in last 360 days      Failed - Valid encounter within last 3 months    Recent Outpatient Visits           4 months ago Encounter for general adult medical examination with abnormal findings   [place] [facility] [place], Salvadore Oxford, NP   11 months ago Need for influenza vaccination   [place] [facility] [place], Salvadore Oxford, NP   4 years ago Cough   [facility] at [facility], Manus Rudd, MD   4 years ago Essential hypertension   [facility] at [facility], Manus Rudd, MD   4 years ago Attention deficit hyperactivity disorder (ADHD), combined type   Primary Care at [facility], Manus Rudd, MD       Future Appointments             In 4 days Baity, Salvadore Oxford, NP  [facility]   In 1 month [place], [place]  W, NP [place] [facility] [place], [facility]

## 2023-06-06 ENCOUNTER — Ambulatory Visit: Payer: 59 | Admitting: Internal Medicine

## 2023-06-06 ENCOUNTER — Encounter: Payer: Self-pay | Admitting: Internal Medicine

## 2023-06-06 VITALS — BP 136/84 | HR 94 | Temp 96.2°F | Wt 294.0 lb

## 2023-06-06 DIAGNOSIS — K861 Other chronic pancreatitis: Secondary | ICD-10-CM

## 2023-06-06 DIAGNOSIS — M545 Low back pain, unspecified: Secondary | ICD-10-CM | POA: Diagnosis not present

## 2023-06-06 DIAGNOSIS — E559 Vitamin D deficiency, unspecified: Secondary | ICD-10-CM | POA: Diagnosis not present

## 2023-06-06 DIAGNOSIS — M542 Cervicalgia: Secondary | ICD-10-CM

## 2023-06-06 DIAGNOSIS — R5383 Other fatigue: Secondary | ICD-10-CM | POA: Diagnosis not present

## 2023-06-06 DIAGNOSIS — E039 Hypothyroidism, unspecified: Secondary | ICD-10-CM

## 2023-06-06 DIAGNOSIS — G8929 Other chronic pain: Secondary | ICD-10-CM | POA: Insufficient documentation

## 2023-06-06 MED ORDER — ONDANSETRON HCL 8 MG PO TABS: 8.0000 mg | ORAL_TABLET | ORAL | 0 refills | Status: DC | PRN

## 2023-06-06 NOTE — Assessment & Plan Note (Signed)
Continue Tylenol, Soma and pregabalin as previously prescribed Referral to orthopedics for further evaluation

## 2023-06-06 NOTE — Progress Notes (Signed)
Subjective:    Patient ID: Gloria Carter, female    DOB: 07/23/1966, 57 y.o.   MRN: [id number]  HPI  Patient presents to clinic today with complaint of low back pain. This is a chronic issue that has worsened in the last few months. She describes the pain as sore and achy but can be sharp and stabbing at times. The pain does not radiate down her legs. She denies numbness, tingling or weakness of the lower extremities. She has a history of chronic pain and fibromyalgia.  MRI lumbar spine from 02/2015 showed:  FINDINGS: There is minimal anterolisthesis of L4 on L5. Slight left convex mid to upper lumbar scoliosis is noted. Vertebral body heights are preserved. Small T12 vertebral body hemangioma is again seen. No significant vertebral marrow edema is identified. Disc desiccation and mild disc space narrowing are present diffusely throughout the lumbar spine. 1.5 cm T2 hyperintense right renal lesion is partially visualized, most likely a cyst. L1-2:  New, minimal disc bulging without stenosis. L2-3: New, mild disc bulging asymmetric to the left without stenosis. L3-4: Progressive, mild circumferential disc bulging without stenosis. L4-5: New, small to moderate sized right foraminal/ extraforaminal disc extrusion contacts and mildly displaces the exiting right L4 nerve root. The disc extrusion and moderate facet arthrosis result in mild right neural foraminal stenosis and minimal spinal canal narrowing. L5-S1: Diminutive right central disc protrusion and mild right and moderate left facet arthrosis without stenosis. The facet arthrosis has progressed from the prior study.  She is currently taking soma, pregabalin and tramadol as prescribed. She uses a rolling walker. She has had shots in her back but has never had surgical intervention. She has seen [facility] in the past.   She also reports chronic fatigue and excessive sleepiness.  She does not have sleep apnea that she is aware of.  She  has a history of depression which she feels like is controlled on her current medication.  She does have a history of hypothyroidism but has been taking her medication as prescribed.  She reports she has been vitamin D and vitamin B12 deficient in the past.  She does not exercise regularly.  Review of Systems     Past Medical History:  Diagnosis Date   ADD (attention deficit disorder)    Allergy    ENVIRONMENTAL   Arthritis    knees   Asthma    Depression    Difficulty sleeping    Dysphagia    Fibromyalgia    GERD (gastroesophageal reflux disease)    Hernia of abdominal wall    Hyperlipidemia    Hypertension    IBS (irritable bowel syndrome)    Migraine    Numbness and tingling in hands    Obesity    Swelling of both ankles    Thyroid disease     Current Outpatient Medications  Medication Sig Dispense Refill   albuterol (VENTOLIN HFA) 108 (90 Base) MCG/ACT inhaler INHALE 2 PUFFS BY MOUTH EVERY 6 HOURS AS NEEDED 8.5 g 1   amphetamine-dextroamphetamine (ADDERALL) 10 MG tablet Take 1 tablet (10 mg total) by mouth 2 (two) times daily with a meal. 60 tablet 0   aspirin EC 81 MG tablet Take 1 tablet (81 mg total) by mouth daily. Swallow whole. 30 tablet 12   azelastine (OPTIVAR) 0.05 % ophthalmic solution PLACE TWO DROPS INTO BOTH EYES TWICE DAILY FOR DRY EYES 6 mL 3   b complex vitamins tablet Take 1 tablet by mouth daily.  Biotin 1000 MCG CHEW Chew 5,000 mcg by mouth.     Calcium-Magnesium-Zinc 167-83-8 MG TABS Take by mouth.     carisoprodol (SOMA) 350 MG tablet TAKE 1 TABLET BY MOUTH FOUR TIMES DAILY AS NEEDED FOR MUSCLE SPASMS 90 tablet 0   cetirizine (ZYRTEC) 10 MG tablet Take 10 mg by mouth daily as needed for allergies.      cholecalciferol (VITAMIN D) 400 UNITS TABS Take 400 Units by mouth daily.     citalopram (CELEXA) 20 MG tablet Take 1 tablet (20 mg total) by mouth daily. 90 tablet 1   esomeprazole (NEXIUM) 40 MG capsule TAKE 1 CAPSULE(40 MG) BY MOUTH DAILY 90  capsule 1   ipratropium (ATROVENT) 0.03 % nasal spray Place 2 sprays into the nose 4 (four) times daily as needed for rhinitis. 30 mL 0   levothyroxine (SYNTHROID) 75 MCG tablet Take 1 tablet (75 mcg total) by mouth daily before breakfast. ON EMPTY STOMACH WITH A SMALL GLASS OF WATER, NO MEDS OR FOODS FOR 20 MIN 90 tablet 1   lipase/protease/amylase (CREON) 36000 UNITS CPEP capsule TAKE 1 CAPSULES BY MOUTH AS DIRECTED 4 CAPS WITH A MEAL AND 1 CAP WITH SNACKS 390 capsule 5   montelukast (SINGULAIR) 10 MG tablet Take 1 tablet (10 mg total) by mouth every morning. 90 tablet 1   Multiple Vitamin (MULTIVITAMIN) tablet Take 1 tablet by mouth daily.     ondansetron (ZOFRAN) 8 MG tablet Take 8 mg by mouth every 4 (four) hours as needed.     pregabalin (LYRICA) 150 MG capsule TAKE 1 CAPSULE(150 MG) BY MOUTH THREE TIMES DAILY 90 capsule 0   SUMAtriptan (IMITREX) 100 MG tablet TAKE 1 TAB BY MOUTH EVERY 2 HOURS AS NEEDED FOR MIGRAINE. NO MORE THAN 2 DOSES IN 24 HOURS. 8 tablet 5   SYMBICORT 160-4.5 MCG/ACT inhaler INHALE 2 PUFFS BY MOUTH TWICE DAILY 10.2 g 0   telmisartan-hydrochlorothiazide (MICARDIS HCT) 80-25 MG tablet Take 1 tablet by mouth daily. 90 tablet 1   traMADol (ULTRAM) 50 MG tablet TAKE 1 TABLET(50 MG) BY MOUTH THREE TIMES DAILY AS NEEDED 90 tablet 0   vitamin C (ASCORBIC ACID) 500 MG tablet Take 1,000 mg by mouth daily.     vitamin E 400 UNIT capsule Take 400 Units by mouth daily.     No current facility-administered medications for this visit.    Allergies  Allergen Reactions   Soap     Any soaps with fragrance cause migraines   Sulfa Antibiotics Nausea And Vomiting    Family History  Problem Relation Age of Onset   COPD Mother    Fibromyalgia Mother    Heart disease Father    Colon polyps Father    Stroke Sister    Fibromyalgia Sister    Colon polyps Maternal Grandmother    Lung disease Paternal Grandmother    Stroke Paternal Grandmother    Diabetes Paternal Grandfather     Heart disease Paternal Grandfather    Colon polyps Maternal Aunt    Aortic aneurysm Maternal Aunt    Cystic kidney disease Maternal Aunt        kidney cyst   Kidney disease Maternal Aunt    Diabetes Maternal Uncle    Fibromyalgia Other        cousin   Colon cancer Neg Hx    Breast cancer Neg Hx     Social History   Socioeconomic History   Marital status: Divorced    Spouse name: Not on  file   Number of children: 0   Years of education: Not on file   Highest education level: Not on file  Occupational History   Occupation: Child psychotherapist    Employer: [employer]  Tobacco Use   Smoking status: Never   Smokeless tobacco: Never  Substance and Sexual Activity   Alcohol use: No    Alcohol/week: 0.0 standard drinks of alcohol   Drug use: No   Sexual activity: Not on file  Other Topics Concern   Not on file  Social History Narrative   Married. Education: [employer].    Social Determinants of Health   Financial Resource Strain: Not on file  Food Insecurity: Not on file  Transportation Needs: Not on file  Physical Activity: Not on file  Stress: Not on file  Social Connections: Not on file  Intimate Partner Violence: Not on file     Constitutional: Patient reports intermittent headaches, fatigue.  Denies fever, malaise, or abrupt weight changes.  HEENT: Denies eye pain, eye redness, ear pain, ringing in the ears, wax buildup, runny nose, nasal congestion, bloody nose, or sore throat. Respiratory: Denies difficulty breathing, shortness of breath, cough or sputum production.   Cardiovascular: Denies chest pain, chest tightness, palpitations or swelling in the hands or feet.  Gastrointestinal: Denies abdominal pain, bloating, constipation, diarrhea or blood in the stool.  GU: Denies urgency, frequency, pain with urination, burning sensation, blood in urine, odor or discharge. Musculoskeletal: Patient reports chronic neck/back pain, muscle pain.  Denies decrease in range of  motion, difficulty with gait, or joint swelling.  Skin: Denies redness, rashes, lesions or ulcercations.  Neurological: Patient reports inattention.  Denies dizziness, difficulty with memory, difficulty with speech or problems with balance and coordination.  Psych: She has a history of anxiety and depression.  Denies SI/HI.  No other specific complaints in a complete review of systems (except as listed in HPI above).  Objective:   Physical Exam  BP 136/84 (BP Location: Left Wrist, Patient Position: Sitting, Cuff Size: Normal)   Pulse 94   Temp (!) 96.2 F (35.7 C) (Temporal)   Wt 294 lb (133.4 kg)   SpO2 99%   BMI 53.77 kg/m   Wt Readings from Last 3 Encounters:  01/07/23 282 lb (127.9 kg)  06/17/22 264 lb (119.7 kg)  01/05/22 280 lb 12.8 oz (127.4 kg)    General: Appears her stated age, obese, in NAD. Skin: Warm, dry and intact.  HEENT: Head: normal shape and size; Eyes: sclera white, no icterus, conjunctiva pink, PERRLA and EOMs intact;  Cardiovascular: Normal rate. Pulmonary/Chest: Normal effort.  Musculoskeletal: Normal flexion, rotation and lateral bending of the spine.  Decreased extension of the spine.  Bony tenderness noted over the lumbar spine and bilateral SI joints.  Strength 5/5 BLE.  Gait slow and steady with use of rolling walker. Neurological: Alert and oriented. Coordination normal.  Psychiatric: Mood and affect normal. Behavior is normal. Judgment and thought content normal.    BMET    Component Value Date/Time   NA 139 01/07/2023 1344   NA 142 01/05/2019 1601   K 3.2 (L) 01/07/2023 1344   CL 93 (L) 01/07/2023 1344   CO2 34 (H) 01/07/2023 1344   GLUCOSE 104 (H) 01/07/2023 1344   BUN 18 01/07/2023 1344   BUN 12 01/05/2019 1601   CREATININE 0.86 01/07/2023 1344   CALCIUM 9.1 01/07/2023 1344   GFRNONAA >60 03/28/2020 1052   GFRNONAA >89 08/23/2014 1707   GFRAA >60 03/28/2020 1052  GFRAA >89 08/23/2014 1707    Lipid Panel     Component Value  Date/Time   CHOL 257 (H) 01/07/2023 1344   CHOL 221 (H) 01/05/2019 1601   TRIG 153 (H) 01/07/2023 1344   HDL 57 01/07/2023 1344   HDL 41 01/05/2019 1601   CHOLHDL 4.5 01/07/2023 1344   VLDL 33 08/31/2013 0918   LDLCALC 170 (H) 01/07/2023 1344    CBC    Component Value Date/Time   WBC 12.7 (H) 01/07/2023 1344   RBC 4.04 01/07/2023 1344   HGB 12.4 01/07/2023 1344   HGB 12.8 06/15/2017 1722   HCT 36.2 01/07/2023 1344   HCT 38.2 06/15/2017 1722   PLT 260 01/07/2023 1344   PLT 216 06/15/2017 1722   MCV 89.6 01/07/2023 1344   MCV 90.5 01/23/2019 1400   MCV 88 06/15/2017 1722   MCH 30.7 01/07/2023 1344   MCHC 34.3 01/07/2023 1344   RDW 12.1 01/07/2023 1344   RDW 13.9 06/15/2017 1722   LYMPHSABS 2.1 03/28/2020 1052   LYMPHSABS 2.1 06/15/2017 1722   MONOABS 0.9 03/28/2020 1052   EOSABS 0.3 03/28/2020 1052   EOSABS 0.2 06/15/2017 1722   BASOSABS 0.1 03/28/2020 1052   BASOSABS 0.0 06/15/2017 1722    Hgb A1C Lab Results  Component Value Date   HGBA1C 5.9 (H) 01/07/2023           Assessment & Plan:   Fatigue, hypothyroidism, vitamin D deficiency:  Will check TSH, free T4, vitamin D and B12 Consider referral to pulmonology for evaluation of possible sleep study Encouraged regular exercise  RTC in 1 month for follow-up of chronic conditions Nicki Reaper, NP

## 2023-06-06 NOTE — Patient Instructions (Signed)

## 2023-06-06 NOTE — Assessment & Plan Note (Signed)
Encouraged weight loss as this can help reduce back pain Encouraged regular stretching and core strengthening Continue Tylenol, Soma and pregabalin as previously prescribed Referral to orthopedics for further evaluation

## 2023-06-07 LAB — VITAMIN D 25 HYDROXY (VIT D DEFICIENCY, FRACTURES): Vit D, 25-Hydroxy: 26 ng/mL — ABNORMAL LOW (ref 30–100)

## 2023-06-07 LAB — VITAMIN B12: Vitamin B-12: 698 pg/mL (ref 200–1100)

## 2023-06-07 LAB — T4, FREE: Free T4: 1.4 ng/dL (ref 0.8–1.8)

## 2023-06-07 LAB — LIPASE: Lipase: 10 U/L (ref 7–60)

## 2023-06-07 LAB — TSH: TSH: 3.43 m[IU]/L (ref 0.40–4.50)

## 2023-06-07 MED ORDER — VITAMIN D (ERGOCALCIFEROL) 1.25 MG (50000 UNIT) PO CAPS: 50000.0000 [IU] | ORAL_CAPSULE | ORAL | 0 refills | Status: AC

## 2023-06-07 NOTE — Addendum Note (Signed)
Addended by: Lorre Munroe on: 06/07/2023 08:51 AM   Modules accepted: Orders

## 2023-06-24 ENCOUNTER — Ambulatory Visit: Payer: 59 | Admitting: Internal Medicine

## 2023-06-24 ENCOUNTER — Encounter: Payer: Self-pay | Admitting: Internal Medicine

## 2023-06-28 ENCOUNTER — Ambulatory Visit: Payer: 59 | Admitting: Internal Medicine

## 2023-06-28 ENCOUNTER — Encounter: Payer: Self-pay | Admitting: Internal Medicine

## 2023-06-28 VITALS — BP 126/84 | HR 110 | Temp 96.9°F | Wt 290.0 lb

## 2023-06-28 DIAGNOSIS — F902 Attention-deficit hyperactivity disorder, combined type: Secondary | ICD-10-CM

## 2023-06-28 DIAGNOSIS — M545 Low back pain, unspecified: Secondary | ICD-10-CM

## 2023-06-28 DIAGNOSIS — M797 Fibromyalgia: Secondary | ICD-10-CM

## 2023-06-28 DIAGNOSIS — G894 Chronic pain syndrome: Secondary | ICD-10-CM

## 2023-06-28 DIAGNOSIS — I10 Essential (primary) hypertension: Secondary | ICD-10-CM

## 2023-06-28 DIAGNOSIS — J4540 Moderate persistent asthma, uncomplicated: Secondary | ICD-10-CM

## 2023-06-28 DIAGNOSIS — E782 Mixed hyperlipidemia: Secondary | ICD-10-CM | POA: Diagnosis not present

## 2023-06-28 DIAGNOSIS — G8929 Other chronic pain: Secondary | ICD-10-CM

## 2023-06-28 DIAGNOSIS — Z23 Encounter for immunization: Secondary | ICD-10-CM

## 2023-06-28 DIAGNOSIS — F419 Anxiety disorder, unspecified: Secondary | ICD-10-CM

## 2023-06-28 DIAGNOSIS — R7303 Prediabetes: Secondary | ICD-10-CM | POA: Diagnosis not present

## 2023-06-28 DIAGNOSIS — F32A Depression, unspecified: Secondary | ICD-10-CM

## 2023-06-28 DIAGNOSIS — G43C1 Periodic headache syndromes in child or adult, intractable: Secondary | ICD-10-CM

## 2023-06-28 DIAGNOSIS — E039 Hypothyroidism, unspecified: Secondary | ICD-10-CM

## 2023-06-28 DIAGNOSIS — K861 Other chronic pancreatitis: Secondary | ICD-10-CM

## 2023-06-28 DIAGNOSIS — K219 Gastro-esophageal reflux disease without esophagitis: Secondary | ICD-10-CM

## 2023-06-28 DIAGNOSIS — M542 Cervicalgia: Secondary | ICD-10-CM

## 2023-06-28 DIAGNOSIS — I700 Atherosclerosis of aorta: Secondary | ICD-10-CM

## 2023-06-28 MED ORDER — AIRSUPRA 90-80 MCG/ACT IN AERO: 1.0000 | INHALATION_SPRAY | RESPIRATORY_TRACT | 2 refills | Status: DC | PRN

## 2023-06-28 MED ORDER — IPRATROPIUM BROMIDE 0.03 % NA SOLN: 2.0000 | Freq: Four times a day (QID) | NASAL | 0 refills | Status: AC | PRN

## 2023-06-28 NOTE — Progress Notes (Unsigned)
Subjective:    Patient ID: Gloria Carter, female    DOB: 1966/08/31, 57 y.o.   MRN: [id number]  HPI  Patient presents to clinic today for follow-up of chronic conditions.  ADD: She reports mainly inattention.  She is not currently taking adderall but would like a refill of this today.  She does not follow with psychiatry.  Asthma: Moderate, persistent.  She reports chronic cough and shortness of breath.  She is taking singulair, symbicort and albuterol as prescribed.  She does not follow with asthma and allergy or pulmonology.  There are no PFTs on file.  Fibromyalgia/chronic joint pain: Mainly in her neck, low back, hips and knees. She recently had injections in her knees by orthopedics which seemed to help. She is taking soma, pregabalin and tramadol as prescribed.  She does not follow with pain management.  HTN: Her BP today is 126/84.  She is taking telmisartan HCT as prescribed.  ECG from 04/2017 reviewed.  Hypothyroidism: She denies any issues on her current dose of levothyroxine.  She does not follow with endocrinology.  Chronic pancreatitis: Managed with [facility].  She is not currently following GI.  GERD: She is not sure what triggers this.  She has occassional breakthrough on esomeprazole for with she takes zantac with good relief of symptoms.  There is no upper GI on file.  Migraines: These occur every few months.  Triggered by smells, artificial sweeteners.  She is taking imitrex as prescribed.  She does not follow with neurology.  Anxiety and depression: Chronic, managed on citalopram.  She is not currently seeing a therapist.  She denies SI/HI.  Prediabetes: Her last A1c was 5.9%, 12/2022.  She is not taking any oral diabetic medications time.  She does not check her sugars.  HLD with aortic atherosclerosis: Her last LDL was 170, triglycerides 153, 12/2022.  She does not take any cholesterol-lowering medications time. She has taken cholesterol medication in the past.  She  is taking aspirin.  She does not consume a low-fat diet.  Review of Systems     Past Medical History:  Diagnosis Date   ADD (attention deficit disorder)    Allergy    ENVIRONMENTAL   Arthritis    knees   Asthma    Depression    Difficulty sleeping    Dysphagia    Fibromyalgia    GERD (gastroesophageal reflux disease)    Hernia of abdominal wall    Hyperlipidemia    Hypertension    IBS (irritable bowel syndrome)    Migraine    Numbness and tingling in hands    Obesity    Swelling of both ankles    Thyroid disease     Current Outpatient Medications  Medication Sig Dispense Refill   albuterol (VENTOLIN HFA) 108 (90 Base) MCG/ACT inhaler INHALE 2 PUFFS BY MOUTH EVERY 6 HOURS AS NEEDED 8.5 g 1   amphetamine-dextroamphetamine (ADDERALL) 10 MG tablet Take 1 tablet (10 mg total) by mouth 2 (two) times daily with a meal. (Patient not taking: Reported on 06/06/2023) 60 tablet 0   aspirin EC 81 MG tablet Take 1 tablet (81 mg total) by mouth daily. Swallow whole. 30 tablet 12   azelastine (OPTIVAR) 0.05 % ophthalmic solution PLACE TWO DROPS INTO BOTH EYES TWICE DAILY FOR DRY EYES 6 mL 3   b complex vitamins tablet Take 1 tablet by mouth daily.     Biotin 1000 MCG CHEW Chew 5,000 mcg by mouth.     Calcium-Magnesium-Zinc [phone]  MG TABS Take by mouth.     carisoprodol (SOMA) 350 MG tablet TAKE 1 TABLET BY MOUTH FOUR TIMES DAILY AS NEEDED FOR MUSCLE SPASMS 90 tablet 0   cetirizine (ZYRTEC) 10 MG tablet Take 10 mg by mouth daily as needed for allergies.      cholecalciferol (VITAMIN D) 400 UNITS TABS Take 400 Units by mouth daily.     citalopram (CELEXA) 20 MG tablet Take 1 tablet (20 mg total) by mouth daily. 90 tablet 1   esomeprazole (NEXIUM) 40 MG capsule TAKE 1 CAPSULE(40 MG) BY MOUTH DAILY 90 capsule 1   ipratropium (ATROVENT) 0.03 % nasal spray Place 2 sprays into the nose 4 (four) times daily as needed for rhinitis. (Patient not taking: Reported on 06/06/2023) 30 mL 0    levothyroxine (SYNTHROID) 75 MCG tablet Take 1 tablet (75 mcg total) by mouth daily before breakfast. ON EMPTY STOMACH WITH A SMALL GLASS OF WATER, NO MEDS OR FOODS FOR 20 MIN 90 tablet 1   lipase/protease/amylase (CREON) 36000 UNITS CPEP capsule TAKE 1 CAPSULES BY MOUTH AS DIRECTED 4 CAPS WITH A MEAL AND 1 CAP WITH SNACKS 390 capsule 5   montelukast (SINGULAIR) 10 MG tablet Take 1 tablet (10 mg total) by mouth every morning. 90 tablet 1   Multiple Vitamin (MULTIVITAMIN) tablet Take 1 tablet by mouth daily.     ondansetron (ZOFRAN) 8 MG tablet Take 1 tablet (8 mg total) by mouth every 4 (four) hours as needed. 20 tablet 0   pregabalin (LYRICA) 150 MG capsule TAKE 1 CAPSULE(150 MG) BY MOUTH THREE TIMES DAILY 90 capsule 0   SUMAtriptan (IMITREX) 100 MG tablet TAKE 1 TAB BY MOUTH EVERY 2 HOURS AS NEEDED FOR MIGRAINE. NO MORE THAN 2 DOSES IN 24 HOURS. 8 tablet 5   SYMBICORT 160-4.5 MCG/ACT inhaler INHALE 2 PUFFS BY MOUTH TWICE DAILY 10.2 g 0   telmisartan-hydrochlorothiazide (MICARDIS HCT) 80-25 MG tablet Take 1 tablet by mouth daily. 90 tablet 1   traMADol (ULTRAM) 50 MG tablet TAKE 1 TABLET(50 MG) BY MOUTH THREE TIMES DAILY AS NEEDED 90 tablet 0   vitamin C (ASCORBIC ACID) 500 MG tablet Take 1,000 mg by mouth daily.     Vitamin D, Ergocalciferol, (DRISDOL) 1.25 MG (50000 UNIT) CAPS capsule Take 1 capsule (50,000 Units total) by mouth once a week. For 12 weeks. Then start OTC Vitamin D3 2,000 unit daily. 12 capsule 0   vitamin E 400 UNIT capsule Take 400 Units by mouth daily.     No current facility-administered medications for this visit.    Allergies  Allergen Reactions   Soap     Any soaps with fragrance cause migraines   Sulfa Antibiotics Nausea And Vomiting    Family History  Problem Relation Age of Onset   COPD Mother    Fibromyalgia Mother    Heart disease Father    Colon polyps Father    Stroke Sister    Fibromyalgia Sister    Colon polyps Maternal Grandmother    Lung disease  Paternal Grandmother    Stroke Paternal Grandmother    Diabetes Paternal Grandfather    Heart disease Paternal Grandfather    Colon polyps Maternal Aunt    Aortic aneurysm Maternal Aunt    Cystic kidney disease Maternal Aunt        kidney cyst   Kidney disease Maternal Aunt    Diabetes Maternal Uncle    Fibromyalgia Other        cousin   Colon  cancer Neg Hx    Breast cancer Neg Hx     Social History   Socioeconomic History   Marital status: Divorced    Spouse name: Not on file   Number of children: 0   Years of education: Not on file   Highest education level: Not on file  Occupational History   Occupation: Child psychotherapist    Employer: [employer]  Tobacco Use   Smoking status: Never   Smokeless tobacco: Never  Substance and Sexual Activity   Alcohol use: No    Alcohol/week: 0.0 standard drinks of alcohol   Drug use: No   Sexual activity: Not on file  Other Topics Concern   Not on file  Social History Narrative   Married. Education: [employer].    Social Determinants of Health   Financial Resource Strain: Not on file  Food Insecurity: Not on file  Transportation Needs: Not on file  Physical Activity: Not on file  Stress: Not on file  Social Connections: Not on file  Intimate Partner Violence: Not on file     Constitutional: Patient reports chronic fatigue, intermittent headaches.  Denies fever, malaise, or abrupt weight changes.  HEENT: Denies eye pain, eye redness, ear pain, ringing in the ears, wax buildup, runny nose, nasal congestion, bloody nose, or sore throat. Respiratory: Patient reports intermittent cough and shortness of breath.  Denies difficulty breathing, or sputum production.   Cardiovascular: Pt reports swelling in legs. Denies chest pain, chest tightness, palpitations or swelling in the hands.  Gastrointestinal: Pt reports intermittent constipation. Denies abdominal pain, bloating, diarrhea or blood in the stool.  GU: Denies urgency, frequency,  pain with urination, burning sensation, blood in urine, odor or discharge. Musculoskeletal: Patient reports chronic joint and muscle pain, difficulty with gait.  Denies decrease in range of motion, or joint swelling.  Skin: Denies redness, rashes, lesions or ulcercations.  Neurological: Patient reports an intention.  Denies dizziness, difficulty with memory, difficulty with speech or problems with balance and coordination.  Psych: Patient has a history of anxiety and depression.  Denies SI/HI.  No other specific complaints in a complete review of systems (except as listed in HPI above).  Objective:   Physical Exam  BP 126/84 (BP Location: Right Wrist, Patient Position: Sitting, Cuff Size: Normal)   Pulse (!) 110   Temp (!) 96.9 F (36.1 C) (Temporal)   Wt 290 lb (131.5 kg)   SpO2 96%   BMI 53.04 kg/m   Wt Readings from Last 3 Encounters:  06/06/23 294 lb (133.4 kg)  01/07/23 282 lb (127.9 kg)  06/17/22 264 lb (119.7 kg)    General: Appears her stated age, obese, in NAD. Skin: Warm, dry and intact.  HEENT: Head: normal shape and size; Eyes: sclera white, no icterus, conjunctiva pink, PERRLA and EOMs intact;  Neck:  Neck supple, trachea midline. No masses, lumps present. Thyromegaly noted. Cardiovascular: Tachycardic with normal rhythm. S1,S2 noted.  No murmur, rubs or gallops noted. No JVD or BLE edema. No carotid bruits noted. Pulmonary/Chest: Normal effort and positive vesicular breath sounds. No respiratory distress. No wheezes, rales or ronchi noted.  Abdomen: Soft and nontender. Normal bowel sounds.  Musculoskeletal: Gait slow and steady with use of walker. Neurological: Alert and oriented. Cranial nerves II-XII grossly intact. Coordination normal.  Psychiatric: Mood and affect normal. Behavior is normal. Judgment and thought content normal.    BMET    Component Value Date/Time   NA 139 01/07/2023 1344   NA 142 01/05/2019 1601  K 3.2 (L) 01/07/2023 1344   CL 93 (L)  01/07/2023 1344   CO2 34 (H) 01/07/2023 1344   GLUCOSE 104 (H) 01/07/2023 1344   BUN 18 01/07/2023 1344   BUN 12 01/05/2019 1601   CREATININE 0.86 01/07/2023 1344   CALCIUM 9.1 01/07/2023 1344   GFRNONAA >60 03/28/2020 1052   GFRNONAA >89 08/23/2014 1707   GFRAA >60 03/28/2020 1052   GFRAA >89 08/23/2014 1707    Lipid Panel     Component Value Date/Time   CHOL 257 (H) 01/07/2023 1344   CHOL 221 (H) 01/05/2019 1601   TRIG 153 (H) 01/07/2023 1344   HDL 57 01/07/2023 1344   HDL 41 01/05/2019 1601   CHOLHDL 4.5 01/07/2023 1344   VLDL 33 08/31/2013 0918   LDLCALC 170 (H) 01/07/2023 1344    CBC    Component Value Date/Time   WBC 12.7 (H) 01/07/2023 1344   RBC 4.04 01/07/2023 1344   HGB 12.4 01/07/2023 1344   HGB 12.8 06/15/2017 1722   HCT 36.2 01/07/2023 1344   HCT 38.2 06/15/2017 1722   PLT 260 01/07/2023 1344   PLT 216 06/15/2017 1722   MCV 89.6 01/07/2023 1344   MCV 90.5 01/23/2019 1400   MCV 88 06/15/2017 1722   MCH 30.7 01/07/2023 1344   MCHC 34.3 01/07/2023 1344   RDW 12.1 01/07/2023 1344   RDW 13.9 06/15/2017 1722   LYMPHSABS 2.1 03/28/2020 1052   LYMPHSABS 2.1 06/15/2017 1722   MONOABS 0.9 03/28/2020 1052   EOSABS 0.3 03/28/2020 1052   EOSABS 0.2 06/15/2017 1722   BASOSABS 0.1 03/28/2020 1052   BASOSABS 0.0 06/15/2017 1722    Hgb A1C Lab Results  Component Value Date   HGBA1C 5.9 (H) 01/07/2023           Assessment & Plan:      RTC in 6 months, for your annual exam Nicki Reaper, NP

## 2023-06-29 ENCOUNTER — Encounter: Payer: Self-pay | Admitting: Internal Medicine

## 2023-06-29 LAB — CBC
HCT: 37.2 % (ref 35.0–45.0)
Hemoglobin: 12.7 g/dL (ref 11.7–15.5)
MCH: 30 pg (ref 27.0–33.0)
MCHC: 34.1 g/dL (ref 32.0–36.0)
MCV: 87.7 fL (ref 80.0–100.0)
MPV: 12.6 fL — ABNORMAL HIGH (ref 7.5–12.5)
Platelets: 225 10*3/uL (ref 140–400)
RBC: 4.24 10*6/uL (ref 3.80–5.10)
RDW: 12.5 % (ref 11.0–15.0)
WBC: 13.1 10*3/uL — ABNORMAL HIGH (ref 3.8–10.8)

## 2023-06-29 MED ORDER — AMPHETAMINE-DEXTROAMPHETAMINE 10 MG PO TABS
10.0000 mg | ORAL_TABLET | Freq: Two times a day (BID) | ORAL | 0 refills | Status: DC
Start: 2023-06-29 — End: 2023-09-22

## 2023-06-29 NOTE — Assessment & Plan Note (Signed)
Encourage diet and exercise for weight loss 

## 2023-06-29 NOTE — Assessment & Plan Note (Signed)
-  Continue Creon

## 2023-06-29 NOTE — Assessment & Plan Note (Signed)
Avoid triggers Continue Imitrex as needed

## 2023-06-29 NOTE — Assessment & Plan Note (Signed)
Encouraged regular stretching and core strengthening Continue Soma, pregabalin and tramadol

## 2023-06-29 NOTE — Assessment & Plan Note (Signed)
C-Met and lipid profile today Encouraged her to consume a low-fat diet Will discuss statin therapy if LDL >100

## 2023-06-29 NOTE — Assessment & Plan Note (Signed)
CMET and lipid profile today Encouraged him to consume a low fat diet. Discussed starting statin therapy if LDL >70 Continue aspirin

## 2023-06-29 NOTE — Assessment & Plan Note (Signed)
Continue Singulair and Symbicort Will trial Airsupra in place of albuterol

## 2023-06-29 NOTE — Assessment & Plan Note (Signed)
Encouraged regular stretching and core strengthening Encourage weight loss as this can help reduce back pain Continue Soma, pregabalin and tramadol

## 2023-06-29 NOTE — Assessment & Plan Note (Signed)
A1c today Encourage low-carb diet and exercise for weight loss

## 2023-06-29 NOTE — Assessment & Plan Note (Signed)
Stable on her current dose of citalopram Support offered

## 2023-06-29 NOTE — Assessment & Plan Note (Signed)
Adderall refilled today

## 2023-06-29 NOTE — Assessment & Plan Note (Addendum)
Avoid foods that trigger reflux Encourage weight loss as this can help reduce reflux symptoms Continue esomeprazole

## 2023-06-29 NOTE — Assessment & Plan Note (Signed)
Recent TSH reviewed Continue levothyroxine

## 2023-06-29 NOTE — Assessment & Plan Note (Signed)
Controlled on telmisartan HCT Reinforced DASH diet and exercise for weight loss C-Met today

## 2023-06-29 NOTE — Patient Instructions (Signed)

## 2023-07-03 ENCOUNTER — Other Ambulatory Visit: Payer: Self-pay | Admitting: Internal Medicine

## 2023-07-03 DIAGNOSIS — G894 Chronic pain syndrome: Secondary | ICD-10-CM

## 2023-07-03 DIAGNOSIS — M797 Fibromyalgia: Secondary | ICD-10-CM

## 2023-07-05 NOTE — Telephone Encounter (Signed)
Requested Prescriptions  Pending Prescriptions Disp Refills   carisoprodol (SOMA) 350 MG tablet [Pharmacy Med Name: CARISOPRODOL 350MG  TABLETS] 90 tablet     Sig: TAKE 1 TABLET BY MOUTH FOUR TIMES DAILY AS NEEDED FOR MUSCLE SPASMS     Not Delegated - Analgesics:  Muscle Relaxants Failed - 07/03/2023 12:30 PM      Failed - This refill cannot be delegated      Passed - Valid encounter within last 6 months    Recent Outpatient Visits           1 week ago Prediabetes   [place] [facility] [place], Salvadore Oxford, NP   4 weeks ago Chronic neck pain   [facility] [place], Salvadore Oxford, NP   5 months ago Encounter for general adult medical examination with abnormal findings   [facility] [place], Salvadore Oxford, NP   1 year ago Need for influenza vaccination   [place] [facility] [place], Salvadore Oxford, NP   4 years ago Cough   [facility], Manus Rudd, MD       Future Appointments             In 6 months Baity, Salvadore Oxford, NP [place] [facility]             pregabalin (LYRICA) 150 MG capsule [Pharmacy Med Name: PREGABALIN 150MG  CAPSULES] 90 capsule     Sig: TAKE 1 CAPSULE(150 MG) BY MOUTH THREE TIMES DAILY     Not Delegated - Neurology:  Anticonvulsants - Controlled - pregabalin Failed - 07/03/2023 12:30 PM      Failed - This refill cannot be delegated      Failed - Cr in normal range and within 360 days    Creat  Date Value Ref Range Status  06/28/2023 1.12 (H) 0.50 - 1.03 mg/dL Final         Passed - Completed PHQ-2 or PHQ-9 in the last 360 days      Passed - Valid encounter within last 12 months    Recent Outpatient Visits           1 week ago Prediabetes   [place] [facility] [place], Salvadore Oxford, NP   4 weeks ago Chronic neck pain   [place] [facility] [place], Salvadore Oxford, NP   5 months ago Encounter for general adult medical  examination with abnormal findings   [place] [facility] [place], Salvadore Oxford, NP   1 year ago Need for influenza vaccination   [place] [facility] [place], Salvadore Oxford, NP   4 years ago Cough   [facility], Manus Rudd, MD       Future Appointments             In 6 months Baity, Salvadore Oxford, NP [place] [facility]             traMADol (ULTRAM) 50 MG tablet [Pharmacy Med Name: TRAMADOL 50MG  TABLETS] 90 tablet     Sig: TAKE 1 TABLET(50 MG) BY MOUTH THREE TIMES DAILY AS NEEDED     Not Delegated - Analgesics:  Opioid Agonists Failed - 07/03/2023 12:30 PM      Failed - This refill cannot be delegated      Failed - Urine Drug Screen completed in last 360 days  Passed - Valid encounter within last 3 months    Recent Outpatient Visits           1 week ago Prediabetes   [place] [facility] [place], Salvadore Oxford, NP   4 weeks ago Chronic neck pain   [place] [facility] [place], Salvadore Oxford, NP   5 months ago Encounter for general adult medical examination with abnormal findings   [place] [facility] [place], Salvadore Oxford, NP   1 year ago Need for influenza vaccination   [place] [facility] [place], Salvadore Oxford, NP   4 years ago Cough   [facility], Manus Rudd, MD       Future Appointments             In 6 months Baity, Salvadore Oxford, NP [place] [facility], [facility]             budesonide-formoterol [facility]) 160-4.5 MCG/ACT inhaler [place] Med Name: SYMBICORT 160/4. (120 ORAL INH)] 10.2 g 2    Sig: INHALE 2 PUFFS BY MOUTH TWICE DAILY     Pulmonology:  Combination Products Passed - 07/03/2023 12:30 PM      Passed - Valid encounter within last 12 months    Recent Outpatient Visits           1 week ago Prediabetes   [place] [facility] [place], Salvadore Oxford, NP   4 weeks ago Chronic neck  pain   [place] [facility] [place], Salvadore Oxford, NP   5 months ago Encounter for general adult medical examination with abnormal findings   [place] [facility] [place], Salvadore Oxford, NP   1 year ago Need for influenza vaccination   [place] [facility] [place], Salvadore Oxford, NP   4 years ago Cough   [facility], Manus Rudd, MD       Future Appointments             In 6 months Baity, Salvadore Oxford, NP [place] [facility], [facility]

## 2023-07-05 NOTE — Telephone Encounter (Signed)
Requested medication (s) are due for refill today: Yes  Requested medication (s) are on the active medication list: Yes  Last refill:  06/01/23 and 06/02/23  Future visit scheduled: Yes  Notes to clinic:  Unable to refill per protocol, cannot delegate.      Requested Prescriptions  Pending Prescriptions Disp Refills   carisoprodol (SOMA) 350 MG tablet [Pharmacy Med Name: CARISOPRODOL 350MG  TABLETS] 90 tablet     Sig: TAKE 1 TABLET BY MOUTH FOUR TIMES DAILY AS NEEDED FOR MUSCLE SPASMS     Not Delegated - Analgesics:  Muscle Relaxants Failed - 07/03/2023 12:30 PM      Failed - This refill cannot be delegated      Passed - Valid encounter within last 6 months    Recent Outpatient Visits           1 week ago Prediabetes   [place] [facility] [place], Salvadore Oxford, NP   4 weeks ago Chronic neck pain   [place] [place], Salvadore Oxford, NP   5 months ago Encounter for general adult medical examination with abnormal findings   [place] [facility] [place], Salvadore Oxford, NP   1 year ago Need for influenza vaccination   [place] [facility] [place], Salvadore Oxford, NP   4 years ago Cough   [facility], Manus Rudd, MD       Future Appointments             In 6 months Baity, Salvadore Oxford, NP [place] [facility], [facility]             pregabalin (LYRICA) 150 MG capsule [Pharmacy Med Name: PREGABALIN 150MG  CAPSULES] 90 capsule     Sig: TAKE 1 CAPSULE(150 MG) BY MOUTH THREE TIMES DAILY     Not Delegated - Neurology:  Anticonvulsants - Controlled - pregabalin Failed - 07/03/2023 12:30 PM      Failed - This refill cannot be delegated      Failed - Cr in normal range and within 360 days    Creat  Date Value Ref Range Status  06/28/2023 1.12 (H) 0.50 - 1.03 mg/dL Final         Passed - Completed PHQ-2 or PHQ-9 in the last 360 days      Passed - Valid encounter within last 12 months     Recent Outpatient Visits           1 week ago Prediabetes   [place] [facility] [place], Salvadore Oxford, NP   4 weeks ago Chronic neck pain   [place] [facility] [place], Salvadore Oxford, NP   5 months ago Encounter for general adult medical examination with abnormal findings   [facility] [place], Salvadore Oxford, NP   1 year ago Need for influenza vaccination   [place] [place], Salvadore Oxford, NP   4 years ago Cough   [facility], Manus Rudd, MD       Future Appointments             In 6 months Baity, Salvadore Oxford, NP  [facility]             traMADol (ULTRAM) 50 MG tablet [Pharmacy Med Name: TRAMADOL 50MG  TABLETS] 90 tablet     Sig: TAKE 1 TABLET(50 MG) BY  MOUTH THREE TIMES DAILY AS NEEDED     Not Delegated - Analgesics:  Opioid Agonists Failed - 07/03/2023 12:30 PM      Failed - This refill cannot be delegated      Failed - Urine Drug Screen completed in last 360 days      Passed - Valid encounter within last 3 months    Recent Outpatient Visits           1 week ago Prediabetes   [place] [facility] [place], Salvadore Oxford, NP   4 weeks ago Chronic neck pain   [place] [facility] [place], Salvadore Oxford, NP   5 months ago Encounter for general adult medical examination with abnormal findings   [place] [facility] [place], Salvadore Oxford, NP   1 year ago Need for influenza vaccination   [place] [facility] [place], Salvadore Oxford, NP   4 years ago Cough   [facility], Manus Rudd, MD       Future Appointments             In 6 months Baity, Salvadore Oxford, NP [place] [facility], [facility]            Signed Prescriptions Disp Refills   budesonide-formoterol (SYMBICORT) 160-4.5 MCG/ACT inhaler 10.2 g 2    Sig: INHALE 2 PUFFS BY MOUTH TWICE DAILY     Pulmonology:   Combination Products Passed - 07/03/2023 12:30 PM      Passed - Valid encounter within last 12 months    Recent Outpatient Visits           1 week ago Prediabetes   [place] [facility] [place], Salvadore Oxford, NP   4 weeks ago Chronic neck pain   [place] [place], Salvadore Oxford, NP   5 months ago Encounter for general adult medical examination with abnormal findings   [place] [facility] [place], Salvadore Oxford, NP   1 year ago Need for influenza vaccination   [place] [facility] [place], Salvadore Oxford, NP   4 years ago Cough   [facility], Manus Rudd, MD       Future Appointments             In 6 months Baity, Salvadore Oxford, NP  [facility], [facility]

## 2023-07-08 ENCOUNTER — Ambulatory Visit: Payer: 59 | Admitting: Internal Medicine

## 2023-07-29 ENCOUNTER — Other Ambulatory Visit: Payer: Self-pay | Admitting: Internal Medicine

## 2023-07-29 NOTE — Telephone Encounter (Signed)
Requested Prescriptions  Pending Prescriptions Disp Refills   montelukast (SINGULAIR) 10 MG tablet [Pharmacy Med Name: MONTELUKAST 10MG  TABLETS] 90 tablet 1    Sig: TAKE 1 TABLET(10 MG) BY MOUTH EVERY MORNING     Pulmonology:  Leukotriene Inhibitors Passed - 07/29/2023  6:58 AM      Passed - Valid encounter within last 12 months    Recent Outpatient Visits           1 month ago Prediabetes   [place] [facility] [place], Salvadore Oxford, NP   1 month ago Chronic neck pain   [facility] [place], Salvadore Oxford, NP   6 months ago Encounter for general adult medical examination with abnormal findings   [place] [facility] [place], Salvadore Oxford, NP   1 year ago Need for influenza vaccination   [place] [facility] [place], Salvadore Oxford, NP   4 years ago Cough   [facility], Manus Rudd, MD       Future Appointments             In 5 months Baity, Salvadore Oxford, NP [place] [facility]             levothyroxine (SYNTHROID) 75 MCG tablet [Pharmacy Med Name: LEVOTHYROXINE 0.075MG  ( ) TABS] 90 tablet 1    Sig: TAKE 1 TABLET BY MOUTH DAILY BEFORE BREAKFAST, ON AN EMPTY STOMACH WITH A SMALL GLASS OF WATER, NO MEDS OR FOOD FOR 20 MINUTES.     Endocrinology:  Hypothyroid Agents Passed - 07/29/2023  6:58 AM      Passed - TSH in normal range and within 360 days    TSH  Date Value Ref Range Status  06/06/2023 3.43 0.40 - 4.50 mIU/L Final         Passed - Valid encounter within last 12 months    Recent Outpatient Visits           1 month ago Prediabetes   [place] [facility] [place], Salvadore Oxford, NP   1 month ago Chronic neck pain   [facility] [place], Salvadore Oxford, NP   6 months ago Encounter for general adult medical examination with abnormal findings   [place] [facility] [place], Salvadore Oxford, NP   1 year ago Need for influenza  vaccination   [place] [facility] [place], Salvadore Oxford, NP   4 years ago Cough   [facility], Manus Rudd, MD       Future Appointments             In 5 months Baity, Salvadore Oxford, NP [place] [facility], [facility]             citalopram (CELEXA) 20 MG tablet [Pharmacy Med Name: CITALOPRAM 20MG  TABLETS] 90 tablet 1    Sig: TAKE 1 TABLET(20 MG) BY MOUTH DAILY     Psychiatry:  Antidepressants - SSRI Passed - 07/29/2023  6:58 AM      Passed - Completed PHQ-2 or PHQ-9 in the last 360 days      Passed - Valid encounter within last 6 months    Recent Outpatient Visits           1 month ago Prediabetes   [place] [facility] [place], [place], NP   1 month ago Chronic neck  pain   [place] [facility] [place], Salvadore Oxford, NP   6 months ago Encounter for general adult medical examination with abnormal findings   [place] [facility] [place], Salvadore Oxford, NP   1 year ago Need for influenza vaccination   [place] [facility] [place], Salvadore Oxford, NP   4 years ago Cough   [facility], Manus Rudd, MD       Future Appointments             In 5 months Baity, Salvadore Oxford, NP [place] [facility], [facility]

## 2023-08-08 ENCOUNTER — Other Ambulatory Visit: Payer: Self-pay | Admitting: Internal Medicine

## 2023-08-08 DIAGNOSIS — M797 Fibromyalgia: Secondary | ICD-10-CM

## 2023-08-08 DIAGNOSIS — G894 Chronic pain syndrome: Secondary | ICD-10-CM

## 2023-08-09 NOTE — Telephone Encounter (Signed)
Requested medication (s) are due for refill today: YES  Requested medication (s) are on the active medication list: yes  Last refill:  multiple dates  Future visit scheduled: yes  Notes to clinic:  Unable to refill per protocol, cannot delegate.      Requested Prescriptions  Pending Prescriptions Disp Refills   ondansetron (ZOFRAN) 8 MG tablet [Pharmacy Med Name: ONDANSETRON 8MG  TABLETS] 20 tablet 0    Sig: TAKE 1 TABLET(8 MG) BY MOUTH EVERY 4 HOURS AS NEEDED     Not Delegated - Gastroenterology: Antiemetics - ondansetron Failed - 08/08/2023 10:25 AM      Failed - This refill cannot be delegated      Passed - AST in normal range and within 360 days    AST  Date Value Ref Range Status  06/28/2023 13 10 - 35 U/L Final         Passed - ALT in normal range and within 360 days    ALT  Date Value Ref Range Status  06/28/2023 13 6 - 29 U/L Final         Passed - Valid encounter within last 6 months    Recent Outpatient Visits           1 month ago Prediabetes   [place] [facility] [place], Salvadore Oxford, NP   2 months ago Chronic neck pain   [place] [facility] [place], Salvadore Oxford, NP   7 months ago Encounter for general adult medical examination with abnormal findings   [place] [facility] [place], Salvadore Oxford, NP   1 year ago Need for influenza vaccination   [place] [place], Salvadore Oxford, NP   4 years ago Cough   [facility], Manus Rudd, MD       Future Appointments             In 5 months Baity, Salvadore Oxford, NP [place] [facility]             carisoprodol (SOMA) 350 MG tablet [Pharmacy Med Name: CARISOPRODOL 350MG  TABLETS] 90 tablet     Sig: TAKE 1 TABLET BY MOUTH FOUR TIMES DAILY AS NEEDED FOR MUSCLE SPASMS     Not Delegated - Analgesics:  Muscle Relaxants Failed - 08/08/2023 10:25 AM      Failed - This refill cannot be delegated      Passed -  Valid encounter within last 6 months    Recent Outpatient Visits           1 month ago Prediabetes   [place] [facility] [place] [place], Salvadore Oxford, NP   2 months ago Chronic neck pain   [facility] [place], Salvadore Oxford, NP   7 months ago Encounter for general adult medical examination with abnormal findings   [place] [facility] [place], Salvadore Oxford, NP   1 year ago Need for influenza vaccination   [place] [facility] [place], Salvadore Oxford, NP   4 years ago Cough   [facility], Manus Rudd, MD       Future Appointments             In 5 months Baity, Salvadore Oxford, NP  [facility]             pregabalin (LYRICA) 150 MG capsule [facility]  Med Name: PREGABALIN 150MG  CAPSULES] 90 capsule     Sig: TAKE 1 CAPSULE(150 MG) BY MOUTH THREE TIMES DAILY     Not Delegated - Neurology:  Anticonvulsants - Controlled - pregabalin Failed - 08/08/2023 10:25 AM      Failed - This refill cannot be delegated      Failed - Cr in normal range and within 360 days    Creat  Date Value Ref Range Status  06/28/2023 1.12 (H) 0.50 - 1.03 mg/dL Final         Passed - Completed PHQ-2 or PHQ-9 in the last 360 days      Passed - Valid encounter within last 12 months    Recent Outpatient Visits           1 month ago Prediabetes   [place] [facility] [place], Salvadore Oxford, NP   2 months ago Chronic neck pain   [place] [facility] [place], Salvadore Oxford, NP   7 months ago Encounter for general adult medical examination with abnormal findings   [place] [facility] [place], Salvadore Oxford, NP   1 year ago Need for influenza vaccination   [place] [facility] [place], Salvadore Oxford, NP   4 years ago Cough   [facility], Manus Rudd, MD       Future Appointments             In 5 months Baity, Salvadore Oxford, NP [place] [facility], [facility]             traMADol (ULTRAM) 50 MG tablet [Pharmacy Med Name: TRAMADOL 50MG  TABLETS] 90 tablet     Sig: TAKE 1 TABLET(50 MG) BY MOUTH THREE TIMES DAILY AS NEEDED     Not Delegated - Analgesics:  Opioid Agonists Failed - 08/08/2023 10:25 AM      Failed - This refill cannot be delegated      Failed - Urine Drug Screen completed in last 360 days      Passed - Valid encounter within last 3 months    Recent Outpatient Visits           1 month ago Prediabetes   [place] [facility] [place], Salvadore Oxford, NP   2 months ago Chronic neck pain   [place] [facility] [place], Salvadore Oxford, NP   7 months ago Encounter for general adult medical examination with abnormal findings   [place] [facility] [place], Salvadore Oxford, NP   1 year ago Need for influenza vaccination   [place] [facility] [place], Salvadore Oxford, NP   4 years ago Cough   [facility], Manus Rudd, MD       Future Appointments             In 5 months Baity, Salvadore Oxford, NP  [facility]

## 2023-09-05 ENCOUNTER — Other Ambulatory Visit: Payer: Self-pay | Admitting: Internal Medicine

## 2023-09-05 DIAGNOSIS — I10 Essential (primary) hypertension: Secondary | ICD-10-CM

## 2023-09-05 DIAGNOSIS — M797 Fibromyalgia: Secondary | ICD-10-CM

## 2023-09-05 DIAGNOSIS — G894 Chronic pain syndrome: Secondary | ICD-10-CM

## 2023-09-06 ENCOUNTER — Other Ambulatory Visit: Payer: Self-pay | Admitting: Internal Medicine

## 2023-09-06 NOTE — Telephone Encounter (Signed)
Requested medication (s) are due for refill today: yes  Requested medication (s) are on the active medication list: yes  Last refill:  08/09/23 #90 0 refills  Future visit scheduled: yes in 4 months   Notes to clinic:  not delegated per protocol. Do you want to refill Rxs?     Requested Prescriptions  Pending Prescriptions Disp Refills   carisoprodol (SOMA) 350 MG tablet [Pharmacy Med Name: CARISOPRODOL 350MG  TABLETS] 90 tablet     Sig: TAKE 1 TABLET BY MOUTH FOUR TIMES DAILY AS NEEDED FOR MUSCLE SPASMS     Not Delegated - Analgesics:  Muscle Relaxants Failed - 09/05/2023  9:19 AM      Failed - This refill cannot be delegated      Passed - Valid encounter within last 6 months    Recent Outpatient Visits           2 months ago Prediabetes   [place] [facility] [place], Salvadore [place], NP   3 months ago Chronic neck pain   [place] [facility] [place], Salvadore Oxford, NP   8 months ago Encounter for general adult medical examination with abnormal findings   [facility] [place], Salvadore Oxford, NP   1 year ago Need for influenza vaccination   [place] [facility] [place], Salvadore Oxford, NP   4 years ago Cough   [facility], Manus Rudd, MD       Future Appointments             In 4 months Baity, Salvadore Oxford, NP [place] [facility]             pregabalin (LYRICA) 150 MG capsule [Pharmacy Med Name: PREGABALIN 150MG  CAPSULES] 90 capsule     Sig: TAKE 1 CAPSULE(150 MG) BY MOUTH THREE TIMES DAILY     Not Delegated - Neurology:  Anticonvulsants - Controlled - pregabalin Failed - 09/05/2023  9:19 AM      Failed - This refill cannot be delegated      Failed - Cr in normal range and within 360 days    Creat  Date Value Ref Range Status  06/28/2023 1.12 (H) 0.50 - 1.03 mg/dL Final         Passed - Completed PHQ-2 or PHQ-9 in the last 360 days      Passed - Valid encounter  within last 12 months    Recent Outpatient Visits           2 months ago Prediabetes   [facility] [place], Salvadore Oxford, NP   3 months ago Chronic neck pain   [facility] [place], Salvadore Oxford, NP   8 months ago Encounter for general adult medical examination with abnormal findings   [place] [facility] [place], Salvadore Oxford, NP   1 year ago Need for influenza vaccination   [place] [facility] [place], Salvadore Oxford, NP   4 years ago Cough   [facility], Manus Rudd, MD       Future Appointments             In 4 months Baity, Salvadore Oxford, NP [facility]             traMADol (ULTRAM) 50 MG tablet [Pharmacy Med Name: TRAMADOL 50MG  TABLETS] 90 tablet  Sig: TAKE 1 TABLET(50 MG) BY MOUTH THREE TIMES DAILY AS NEEDED     Not Delegated - Analgesics:  Opioid Agonists Failed - 09/05/2023  9:19 AM      Failed - This refill cannot be delegated      Failed - Urine Drug Screen completed in last 360 days      Passed - Valid encounter within last 3 months    Recent Outpatient Visits           2 months ago Prediabetes   [place] [facility] [place], Salvadore Oxford, NP   3 months ago Chronic neck pain   [place] [facility] [place], [place] W, NP   8 months ago Encounter for general adult medical examination with abnormal findings   [place] [facility] [place], Salvadore Oxford, NP   1 year ago Need for influenza vaccination   [place] [facility] [place], Salvadore Oxford, NP   4 years ago Cough   [facility], Manus Rudd, MD       Future Appointments             In 4 months Baity, Salvadore Oxford, NP [place] [facility], [facility]            Signed Prescriptions Disp Refills   telmisartan-hydrochlorothiazide (MICARDIS HCT) 80-25 MG tablet 90 tablet 1    Sig: TAKE 1 TABLET BY MOUTH  DAILY     Cardiovascular: ARB + Diuretic Combos Failed - 09/05/2023  9:19 AM      Failed - Cr in normal range and within 180 days    Creat  Date Value Ref Range Status  06/28/2023 1.12 (H) 0.50 - 1.03 mg/dL Final         Passed - K in normal range and within 180 days    Potassium  Date Value Ref Range Status  06/28/2023 4.0 3.5 - 5.3 mmol/L Final         Passed - Na in normal range and within 180 days    Sodium  Date Value Ref Range Status  06/28/2023 135 135 - 146 mmol/L Final  01/05/2019 142 134 - 144 mmol/L Final         Passed - eGFR is 10 or above and within 180 days    GFR, Est African American  Date Value Ref Range Status  08/23/2014 >89 mL/min Final   GFR calc Af Amer  Date Value Ref Range Status  03/28/2020 >60 >60 mL/min Final   GFR, Est Non African American  Date Value Ref Range Status  08/23/2014 >89 mL/min Final    Comment:      The estimated GFR is a calculation valid for adults (>=24 years old) that uses the CKD-EPI algorithm to adjust for age and sex. It is   not to be used for children, pregnant women, hospitalized patients,    patients on dialysis, or with rapidly changing kidney function. According to the NKDEP, eGFR >89 is normal, 60-89 shows mild impairment, 30-59 shows moderate impairment, 15-29 shows severe impairment and <15 is ESRD.      GFR calc non Af Amer  Date Value Ref Range Status  03/28/2020 >60 >60 mL/min Final   GFR  Date Value Ref Range Status  01/05/2022 85.26 >60.00 mL/min Final    Comment:    Calculated using the CKD-EPI Creatinine Equation (2021)   eGFR  Date Value Ref Range Status  06/28/2023 57 (L) > OR = 60 mL/min/1.14m2 Final         Passed - Patient is not pregnant      Passed - Last BP in normal range    BP Readings from Last 1 Encounters:  06/28/23 126/84         Passed - Valid encounter within last 6 months    Recent Outpatient Visits           2 months ago Prediabetes   [place] [place], Salvadore Oxford, NP   3 months ago Chronic neck pain   [place] [facility] [place], Salvadore Oxford, NP   8 months ago Encounter for general adult medical examination with abnormal findings   [place] [facility] [place], Salvadore Oxford, NP   1 year ago Need for influenza vaccination   [place] [facility] [place], Salvadore Oxford, NP   4 years ago Cough   [facility], Manus Rudd, MD       Future Appointments             In 4 months Baity, Salvadore Oxford, NP [place] [facility], [facility]

## 2023-09-06 NOTE — Telephone Encounter (Signed)
Requested by interface surescripts. Future visit in 4 months. Requested Prescriptions  Pending Prescriptions Disp Refills   carisoprodol (SOMA) 350 MG tablet [Pharmacy Med Name: CARISOPRODOL 350MG  TABLETS] 90 tablet     Sig: TAKE 1 TABLET BY MOUTH FOUR TIMES DAILY AS NEEDED FOR MUSCLE SPASMS     Not Delegated - Analgesics:  Muscle Relaxants Failed - 09/05/2023  9:19 AM      Failed - This refill cannot be delegated      Passed - Valid encounter within last 6 months    Recent Outpatient Visits           2 months ago Prediabetes   [place] [facility] [place], Salvadore Oxford, NP   3 months ago Chronic neck pain   [place] [facility] [place], Salvadore Oxford, NP   8 months ago Encounter for general adult medical examination with abnormal findings   [place] [facility] [place], Salvadore Oxford, NP   1 year ago Need for influenza vaccination   [place] [facility] [place], Salvadore Oxford, NP   4 years ago Cough   [facility], Manus Rudd, MD       Future Appointments             In 4 months Baity, Salvadore Oxford, NP [facility]             pregabalin (LYRICA) 150 MG capsule [Pharmacy Med Name: PREGABALIN 150MG  CAPSULES] 90 capsule     Sig: TAKE 1 CAPSULE(150 MG) BY MOUTH THREE TIMES DAILY     Not Delegated - Neurology:  Anticonvulsants - Controlled - pregabalin Failed - 09/05/2023  9:19 AM      Failed - This refill cannot be delegated      Failed - Cr in normal range and within 360 days    Creat  Date Value Ref Range Status  06/28/2023 1.12 (H) 0.50 - 1.03 mg/dL Final         Passed - Completed PHQ-2 or PHQ-9 in the last 360 days      Passed - Valid encounter within last 12 months    Recent Outpatient Visits           2 months ago Prediabetes   [place] [facility] [place], [place], NP   3 months ago Chronic neck pain   [place] [facility]  [place], Salvadore Oxford, NP   8 months ago Encounter for general adult medical examination with abnormal findings   [place] [facility] [place], Salvadore Oxford, NP   1 year ago Need for influenza vaccination   [place] [facility] [place], Salvadore Oxford, NP   4 years ago Cough   [facility], Manus Rudd, MD       Future Appointments             In 4 months Baity, Salvadore Oxford, NP [place] [facility]             traMADol (ULTRAM) 50 MG tablet [Pharmacy Med Name: TRAMADOL 50MG  TABLETS] 90 tablet     Sig: TAKE 1 TABLET(50 MG) BY MOUTH THREE TIMES DAILY AS NEEDED     Not Delegated - Analgesics:  Opioid Agonists Failed - 09/05/2023  9:19 AM      Failed - This refill cannot be delegated  Failed - Urine Drug Screen completed in last 360 days      Passed - Valid encounter within last 3 months    Recent Outpatient Visits           2 months ago Prediabetes   [place] [place], [place] W, NP   3 months ago Chronic neck pain   [place] [place], [place], NP   8 months ago Encounter for general adult medical examination with abnormal findings   [place] [facility] [place], Salvadore Oxford, NP   1 year ago Need for influenza vaccination   [place] [facility] [place], Salvadore Oxford, NP   4 years ago Cough   [facility], Manus Rudd, MD       Future Appointments             In 4 months Baity, Salvadore Oxford, NP  [facility]             telmisartan-hydrochlorothiazide (MICARDIS HCT) 80-25 MG tablet [Pharmacy Med Name: TELMISARTAN/HCTZ 80-25MG  TABS] 90 tablet 1    Sig: TAKE 1 TABLET BY MOUTH DAILY     Cardiovascular: ARB + Diuretic Combos Failed - 09/05/2023  9:19 AM      Failed - Cr in normal range and within 180 days    Creat  Date Value Ref Range Status  06/28/2023 1.12 (H) 0.50 - 1.03 mg/dL  Final         Passed - K in normal range and within 180 days    Potassium  Date Value Ref Range Status  06/28/2023 4.0 3.5 - 5.3 mmol/L Final         Passed - Na in normal range and within 180 days    Sodium  Date Value Ref Range Status  06/28/2023 135 135 - 146 mmol/L Final  01/05/2019 142 134 - 144 mmol/L Final         Passed - eGFR is 10 or above and within 180 days    GFR, Est African American  Date Value Ref Range Status  08/23/2014 >89 mL/min Final   GFR calc Af Amer  Date Value Ref Range Status  03/28/2020 >60 >60 mL/min Final   GFR, Est Non African American  Date Value Ref Range Status  08/23/2014 >89 mL/min Final    Comment:      The estimated GFR is a calculation valid for adults (>=61 years old) that uses the CKD-EPI algorithm to adjust for age and sex. It is   not to be used for children, pregnant women, hospitalized patients,    patients on dialysis, or with rapidly changing kidney function. According to the NKDEP, eGFR >89 is normal, 60-89 shows mild impairment, 30-59 shows moderate impairment, 15-29 shows severe impairment and <15 is ESRD.      GFR calc non Af Amer  Date Value Ref Range Status  03/28/2020 >60 >60 mL/min Final   GFR  Date Value Ref Range Status  01/05/2022 85.26 >60.00 mL/min Final    Comment:    Calculated using the CKD-EPI Creatinine Equation (2021)   eGFR  Date Value Ref Range Status  06/28/2023 57 (L) > OR = 60 mL/min/1.70m2 Final         Passed - Patient is not pregnant      Passed - Last BP in normal range    BP Readings from Last 1 Encounters:  06/28/23 126/84         Passed - Valid encounter within last 6 months    Recent Outpatient Visits           2 months ago Prediabetes   [facility] [place], Salvadore Oxford, NP   3 months ago Chronic neck pain   [facility] [place], Salvadore Oxford, NP   8 months ago Encounter for general adult medical examination with abnormal  findings   [place] [facility] [place], Salvadore Oxford, NP   1 year ago Need for influenza vaccination   [place] [facility] [place], Salvadore Oxford, NP   4 years ago Cough   [facility], Manus Rudd, MD       Future Appointments             In 4 months Baity, Salvadore Oxford, NP [facility], [facility] [place]

## 2023-09-07 NOTE — Telephone Encounter (Signed)
Requested medication (s) are due for refill today -yes  Requested medication (s) are on the active medication list yes  Future visit scheduled yes  Last refill: 01/11/23 390 5RF  Notes to clinic: off protocol- provider review   Requested Prescriptions  Pending Prescriptions Disp Refills   CREON 36000-114000 units CPEP capsule [Pharmacy Med Name: CREON DR 36,000 UNIT CAPSULE] 400 capsule 5    Sig: TAKE 1 CAPSULES BY MOUTH AS DIRECTED 4 CAPSULES WITH A MEAL AND 1 CAPSULE WITH SNACKS     Off-Protocol Failed - 09/06/2023  4:20 PM      Failed - Medication not assigned to a protocol, review manually.      Passed - Valid encounter within last 12 months    Recent Outpatient Visits           2 months ago Prediabetes   [place] [facility] [place], Salvadore Oxford, NP   3 months ago Chronic neck pain   [facility] [place], Salvadore Oxford, NP   8 months ago Encounter for general adult medical examination with abnormal findings   [place] [facility] [place], Salvadore Oxford, NP   1 year ago Need for influenza vaccination   [place] [facility] [place], Salvadore Oxford, NP   4 years ago Cough   [facility], Manus Rudd, MD       Future Appointments             In 4 months Baity, Salvadore Oxford, NP [place], [facility]               Requested Prescriptions  Pending Prescriptions Disp Refills   CREON 36000-114000 units CPEP capsule [Pharmacy Med Name: CREON DR 36,000 UNIT CAPSULE] 400 capsule 5    Sig: TAKE 1 CAPSULES BY MOUTH AS DIRECTED 4 CAPSULES WITH A MEAL AND 1 CAPSULE WITH SNACKS     Off-Protocol Failed - 09/06/2023  4:20 PM      Failed - Medication not assigned to a protocol, review manually.      Passed - Valid encounter within last 12 months    Recent Outpatient Visits           2 months ago Prediabetes   [place] [facility] [place], Salvadore Oxford, NP   3 months  ago Chronic neck pain   [place] [facility] [place], Salvadore Oxford, NP   8 months ago Encounter for general adult medical examination with abnormal findings   [place] [facility] [place], Salvadore Oxford, NP   1 year ago Need for influenza vaccination   [place] [facility] [place], Salvadore Oxford, NP   4 years ago Cough   [facility], Manus Rudd, MD       Future Appointments             In 4 months Baity, Salvadore Oxford, NP  [facility]

## 2023-09-22 ENCOUNTER — Other Ambulatory Visit: Payer: Self-pay | Admitting: Internal Medicine

## 2023-09-22 DIAGNOSIS — F902 Attention-deficit hyperactivity disorder, combined type: Secondary | ICD-10-CM

## 2023-09-22 MED ORDER — AMPHETAMINE-DEXTROAMPHETAMINE 10 MG PO TABS: 10.0000 mg | ORAL_TABLET | Freq: Two times a day (BID) | ORAL | 0 refills | Status: DC

## 2023-09-22 NOTE — Telephone Encounter (Signed)
Requested medication (s) are due for refill today:   Provider to review  Requested medication (s) are on the active medication list:   Yes  Future visit scheduled:   Yes 01/09/2024.   LOV 06/28/2023   Last ordered: 06/29/2023 #60, 0 refills   Non delegated refill  Requested Prescriptions  Pending Prescriptions Disp Refills   amphetamine-dextroamphetamine (ADDERALL) 10 MG tablet 60 tablet 0    Sig: Take 1 tablet (10 mg total) by mouth 2 (two) times daily with a meal.     Not Delegated - Psychiatry:  Stimulants/ADHD Failed - 09/22/2023  3:25 PM      Failed - This refill cannot be delegated      Failed - Urine Drug Screen completed in last 360 days      Passed - Last BP in normal range    BP Readings from Last 1 Encounters:  06/28/23 126/84         Passed - Last Heart Rate in normal range    Pulse Readings from Last 1 Encounters:  06/28/23 (!) 110         Passed - Valid encounter within last 6 months    Recent Outpatient Visits           2 months ago Prediabetes   [place] [facility] [place], [place], NP   3 months ago Chronic neck pain   [place] [place], Salvadore Oxford, NP   8 months ago Encounter for general adult medical examination with abnormal findings   [place] [facility] [place], Salvadore Oxford, NP   1 year ago Need for influenza vaccination   [place] [facility] [place], Salvadore Oxford, NP   4 years ago Cough   [facility], Manus Rudd, MD       Future Appointments             In 3 months Baity, Salvadore Oxford, NP  [facility], [facility]

## 2023-09-22 NOTE — Telephone Encounter (Signed)
Medication Refill -  Most Recent Primary Care Visit:  Provider: Lorre Munroe  Department: [facility]  Visit Type: OFFICE VISIT  Date: 06/28/2023  Medication: amphetamine-dextroamphetamine (ADDERALL) 10 MG tablet [[id number]]   Has the patient contacted their pharmacy? Yes (Agent: If no, request that the patient contact the pharmacy for the refill. If patient does not wish to contact the pharmacy document the reason why and proceed with request.) (Agent: If yes, when and what did the pharmacy advise?)  Is this the correct pharmacy for this prescription? Yes If no, delete pharmacy and type the correct one.  This is the patient's preferred pharmacy:   [id number] [place], [place] - [address] [place] [address] Phone: [phone] Fax: [phone]   Has the prescription been filled recently? Yes  Is the patient out of the medication? Yes  Has the patient been seen for an appointment in the last year OR does the patient have an upcoming appointment? Yes  Can we respond through MyChart? NO  Agent: Please be advised that Rx refills may take up to 3 business days. We ask that you follow-up with your pharmacy.

## 2023-10-18 ENCOUNTER — Other Ambulatory Visit: Payer: Self-pay | Admitting: Internal Medicine

## 2023-10-18 DIAGNOSIS — M797 Fibromyalgia: Secondary | ICD-10-CM

## 2023-10-18 DIAGNOSIS — G894 Chronic pain syndrome: Secondary | ICD-10-CM

## 2023-10-20 NOTE — Telephone Encounter (Signed)
 Requested medications are due for refill today.  yes  Requested medications are on the active medications list.  yes  Last refill. varied  Future visit scheduled.   yes  Notes to clinic.  Most are not delegated. 2 are inhalers. Please review for refill.    Requested Prescriptions  Pending Prescriptions Disp Refills   SYMBICORT  160-4.5 MCG/ACT inhaler [Pharmacy Med Name: SYMBICORT  160/4. (120 ORAL INH)] 10.2 g 2    Sig: INHALE 2 PUFFS BY MOUTH TWICE DAILY     Pulmonology:  Combination Products Passed - 10/20/2023  5:53 PM      Passed - Valid encounter within last 12 months    Recent Outpatient Visits           3 months ago Prediabetes   [place] [facility] [place], Angeline ORN, NP   4 months ago Chronic neck pain   [place] [facility] [place], Angeline ORN, NP   9 months ago Encounter for general adult medical examination with abnormal findings   [place] [facility] [place], Angeline ORN, NP   1 year ago Need for influenza vaccination   [place] [facility] [place], Angeline ORN, NP   4 years ago Cough   [facility] at [place] [place], Santana LABOR, MD       Future Appointments             In 2 months Baity, Angeline ORN, NP [place] [facility]             ondansetron  (ZOFRAN ) 8 MG tablet [Pharmacy Med Name: ONDANSETRON  8MG  TABLETS] 20 tablet 0    Sig: TAKE 1 TABLET(8 MG) BY MOUTH EVERY 4 HOURS AS NEEDED     Not Delegated - Gastroenterology: Antiemetics - ondansetron  Failed - 10/20/2023  5:53 PM      Failed - This refill cannot be delegated      Passed - AST in normal range and within 360 days    AST  Date Value Ref Range Status  06/28/2023 13 10 - 35 U/L Final         Passed - ALT in normal range and within 360 days    ALT  Date Value Ref Range Status  06/28/2023 13 6 - 29 U/L Final         Passed - Valid encounter within last 6 months    Recent Outpatient Visits           3  months ago Prediabetes   [place] [facility] [place], Angeline ORN, NP   4 months ago Chronic neck pain   [place] [facility] [place], Angeline ORN, NP   9 months ago Encounter for general adult medical examination with abnormal findings   [place] [facility] [place], Angeline ORN, NP   1 year ago Need for influenza vaccination   [place] [facility] [place], Angeline ORN, NP   4 years ago Cough   [facility] at [place], Santana LABOR, MD       Future Appointments             In 2 months Baity, Angeline ORN, NP [place] [facility]             AIRSUPRA  90-80 MCG/ACT AERO [Pharmacy Med Name: AIRSUPRA  90-80MCG INH(120 PUFFS)] 10.7 g 2    Sig: INHALE 1 PUFF INTO  THE LUNGS EVERY 4 HOURS AS NEEDED     Off-Protocol Failed - 10/20/2023  5:53 PM      Failed - Medication not assigned to a protocol, review manually.      Passed - Valid encounter within last 12 months    Recent Outpatient Visits           3 months ago Prediabetes   [place] [facility] [place], Angeline ORN, NP   4 months ago Chronic neck pain   [place] [facility] [place], Angeline ORN, NP   9 months ago Encounter for general adult medical examination with abnormal findings   [place] [facility] [place], Angeline ORN, NP   1 year ago Need for influenza vaccination   [place] [facility] [place], Angeline ORN, NP   4 years ago Cough   [facility] [place], Santana LABOR, MD       Future Appointments             In 2 months Baity, Angeline ORN, NP [place] [facility]             carisoprodol  (SOMA ) 350 MG tablet [Pharmacy Med Name: CARISOPRODOL  350MG  TABLETS] 90 tablet     Sig: TAKE 1 TABLET BY MOUTH FOUR TIMES DAILY AS NEEDED FOR MUSCLE SPASMS     Not Delegated - Analgesics:  Muscle Relaxants Failed - 10/20/2023  5:53 PM      Failed - This refill  cannot be delegated      Passed - Valid encounter within last 6 months    Recent Outpatient Visits           3 months ago Prediabetes   [place] [facility] [place], Angeline ORN, NP   4 months ago Chronic neck pain   [place] [place], Angeline ORN, NP   9 months ago Encounter for general adult medical examination with abnormal findings   [place] [facility], Angeline ORN, NP   1 year ago Need for influenza vaccination   [place] [facility] [place] [place], Angeline ORN, NP   4 years ago Cough   [facility] at [place], Santana LABOR, MD       Future Appointments             In 2 months Baity, Angeline ORN, NP [place] [facility]             pregabalin  (LYRICA ) 150 MG capsule [Pharmacy Med Name: PREGABALIN  150MG  CAPSULES] 90 capsule     Sig: TAKE 1 CAPSULE(150 MG) BY MOUTH THREE TIMES DAILY     Not Delegated - Neurology:  Anticonvulsants - Controlled - pregabalin  Failed - 10/20/2023  5:53 PM      Failed - This refill cannot be delegated      Failed - Cr in normal range and within 360 days    Creat  Date Value Ref Range Status  06/28/2023 1.12 (H) 0.50 - 1.03 mg/dL Final         Passed - Completed PHQ-2 or PHQ-9 in the last 360 days      Passed - Valid encounter within last 12 months    Recent Outpatient Visits           3 months ago Prediabetes   [place] [facility] [place], Angeline ORN, [place]  4 months ago Chronic neck pain   [facility] [facility] [place], Angeline ORN, NP   9 months ago Encounter for general adult medical examination with abnormal findings   [place] [facility] [place], Angeline ORN, NP   1 year ago Need for influenza vaccination   [place] [facility] [place], Angeline ORN, NP   4 years ago Cough   [facility] [place], Santana LABOR, MD       Future Appointments             In 2 months Baity,  Angeline ORN, NP [place] [facility]             traMADol  (ULTRAM ) 50 MG tablet [Pharmacy Med Name: TRAMADOL  50MG  TABLETS] 90 tablet     Sig: TAKE 1 TABLET(50 MG) BY MOUTH THREE TIMES DAILY AS NEEDED     Not Delegated - Analgesics:  Opioid Agonists Failed - 10/20/2023  5:53 PM      Failed - This refill cannot be delegated      Failed - Urine Drug Screen completed in last 360 days      Failed - Valid encounter within last 3 months    Recent Outpatient Visits           3 months ago Prediabetes   [place] [facility] [place], Angeline ORN, NP   4 months ago Chronic neck pain   [place] [facility] [place], Angeline ORN, NP   9 months ago Encounter for general adult medical examination with abnormal findings   [place] [facility] [place], Angeline ORN, NP   1 year ago Need for influenza vaccination   [place] [facility] [place], Angeline ORN, NP   4 years ago Cough   [facility] [place], Santana LABOR, MD       Future Appointments             In 2 months Baity, Angeline ORN, NP [place] [facility], [facility]

## 2023-11-11 ENCOUNTER — Other Ambulatory Visit: Payer: Self-pay | Admitting: Internal Medicine

## 2023-11-11 DIAGNOSIS — F902 Attention-deficit hyperactivity disorder, combined type: Secondary | ICD-10-CM

## 2023-11-11 NOTE — Telephone Encounter (Signed)
Medication Refill -  Most Recent Primary Care Visit:  Provider: Lorre Munroe  Department: [facility]  Visit Type: OFFICE VISIT  Date: 06/28/2023  Medication: amphetamine-dextroamphetamine (ADDERALL) 10 MG tablet   Has the patient contacted their pharmacy? No  Is this the correct pharmacy for this prescription? Yes If no, delete pharmacy and type the correct one.  This is the [facility]'s preferred pharmacy[id number] [place], [place] - [address] [place] [address] Phone: [phone] Fax: [phone]  Has the prescription been filled recently? Yes  Is the patient out of the medication? Yes  Has the patient been seen for an appointment in the last year OR does the patient have an upcoming appointment? Yes  Can we respond through MyChart? No  Agent: Please be advised that Rx refills may take up to 3 business days. We ask that you follow-up with your pharmacy.

## 2023-11-11 NOTE — Telephone Encounter (Signed)
Requested medications are due for refill today.  yes  Requested medications are on the active medications list.  yes  Last refill. 09/22/2023 #60 0 rf  Future visit scheduled.   yes  Notes to clinic.  Refill not delegated.    Requested Prescriptions  Pending Prescriptions Disp Refills   amphetamine-dextroamphetamine (ADDERALL) 10 MG tablet 60 tablet 0    Sig: Take 1 tablet (10 mg total) by mouth 2 (two) times daily with a meal.     Not Delegated - Psychiatry:  Stimulants/ADHD Failed - 11/11/2023  3:56 PM      Failed - This refill cannot be delegated      Failed - Urine Drug Screen completed in last 360 days      Passed - Last BP in normal range    BP Readings from Last 1 Encounters:  06/28/23 126/84         Passed - Last Heart Rate in normal range    Pulse Readings from Last 1 Encounters:  06/28/23 (!) 110         Passed - Valid encounter within last 6 months    Recent Outpatient Visits           4 months ago Prediabetes   [place] [facility] [place], [place], NP   5 months ago Chronic neck pain   [place] [facility] [place], Salvadore [place], NP   10 months ago Encounter for general adult medical examination with abnormal findings   [place] [facility] [place], Salvadore Oxford, NP   1 year ago Need for influenza vaccination   [place] [facility] [place], Salvadore Oxford, NP   4 years ago Cough   [facility] at [facility], Manus Rudd, MD       Future Appointments             In 1 month Baity, Salvadore Oxford, NP [place] [facility], [facility]

## 2023-11-14 MED ORDER — AMPHETAMINE-DEXTROAMPHETAMINE 10 MG PO TABS: 10.0000 mg | ORAL_TABLET | Freq: Two times a day (BID) | ORAL | 0 refills | Status: DC

## 2023-11-30 ENCOUNTER — Other Ambulatory Visit: Payer: Self-pay | Admitting: Internal Medicine

## 2023-11-30 DIAGNOSIS — G894 Chronic pain syndrome: Secondary | ICD-10-CM

## 2023-11-30 DIAGNOSIS — M797 Fibromyalgia: Secondary | ICD-10-CM

## 2023-11-30 NOTE — Telephone Encounter (Signed)
Requested medication (s) are due for refill today: yes  Requested medication (s) are on the active medication list: yes  Last refill:  10/21/23 #90/0 for all   Future visit scheduled: yes  Notes to clinic:  Unable to refill per protocol, cannot delegate.    Requested Prescriptions  Pending Prescriptions Disp Refills   carisoprodol (SOMA) 350 MG tablet [Pharmacy Med Name: CARISOPRODOL 350MG  TABLETS] 90 tablet     Sig: TAKE 1 TABLET BY MOUTH FOUR TIMES DAILY AS NEEDED FOR MUSCLE SPASMS     Not Delegated - Analgesics:  Muscle Relaxants Failed - 11/30/2023  4:37 PM      Failed - This refill cannot be delegated      Passed - Valid encounter within last 6 months    Recent Outpatient Visits           5 months ago Prediabetes   [facility] [place], Salvadore Oxford, NP   5 months ago Chronic neck pain   [place] [facility] [place], Salvadore Oxford, NP   10 months ago Encounter for general adult medical examination with abnormal findings   [place] [facility] [place], Salvadore Oxford, NP   1 year ago Need for influenza vaccination   [place] [facility] [place], Salvadore Oxford, NP   4 years ago Cough   [facility] at [facility], Manus Rudd, MD       Future Appointments             In 1 month Baity, Salvadore Oxford, NP [place] [facility]             pregabalin (LYRICA) 150 MG capsule [Pharmacy Med Name: PREGABALIN 150MG  CAPSULES] 90 capsule     Sig: TAKE 1 CAPSULE(150 MG) BY MOUTH THREE TIMES DAILY     Not Delegated - Neurology:  Anticonvulsants - Controlled - pregabalin Failed - 11/30/2023  4:37 PM      Failed - This refill cannot be delegated      Failed - Cr in normal range and within 360 days    Creat  Date Value Ref Range Status  06/28/2023 1.12 (H) 0.50 - 1.03 mg/dL Final         Passed - Completed PHQ-2 or PHQ-9 in the last 360 days      Passed - Valid encounter within last 12 months     Recent Outpatient Visits           5 months ago Prediabetes   [place] [facility] [place], Salvadore Oxford, NP   5 months ago Chronic neck pain   [place] [facility] [place], Salvadore Oxford, NP   10 months ago Encounter for general adult medical examination with abnormal findings   [place] [facility] [place], Salvadore Oxford, NP   1 year ago Need for influenza vaccination   [place] [facility] [place], Salvadore Oxford, NP   4 years ago Cough   [facility] at [facility], Manus Rudd, MD       Future Appointments             In 1 month Baity, Salvadore Oxford, NP [place] [facility]             traMADol (ULTRAM) 50 MG tablet [Pharmacy Med Name: TRAMADOL 50MG  TABLETS] 90 tablet     Sig: TAKE 1 TABLET(50  MG) BY MOUTH THREE TIMES DAILY AS NEEDED     Not Delegated - Analgesics:  Opioid Agonists Failed - 11/30/2023  4:37 PM      Failed - This refill cannot be delegated      Failed - Urine Drug Screen completed in last 360 days      Failed - Valid encounter within last 3 months    Recent Outpatient Visits           5 months ago Prediabetes   [place] [facility] [place], Salvadore Oxford, NP   5 months ago Chronic neck pain   [place] [facility] [place], Salvadore Oxford, NP   10 months ago Encounter for general adult medical examination with abnormal findings   [place] [facility] [place], Salvadore Oxford, NP   1 year ago Need for influenza vaccination   [place] [facility] [place], Salvadore Oxford, NP   4 years ago Cough   Primary Care at [facility], Manus Rudd, MD       Future Appointments             In 1 month Baity, Salvadore Oxford, NP [place] [facility], [facility]

## 2024-01-06 ENCOUNTER — Other Ambulatory Visit: Payer: Self-pay | Admitting: Internal Medicine

## 2024-01-06 DIAGNOSIS — F902 Attention-deficit hyperactivity disorder, combined type: Secondary | ICD-10-CM

## 2024-01-06 NOTE — Telephone Encounter (Signed)
 Copied from CRM [id number]. Topic: Clinical - Medication Refill >> Jan 06, 2024  4:38 PM Shelah Lewandowsky wrote: Most Recent Primary Care Visit:  Provider: Lorre Munroe  Department: [facility]  Visit Type: OFFICE VISIT  Date: 06/28/2023  Medication: amphetamine-dextroamphetamine (ADDERALL) 10 MG tablet  Has the patient contacted their pharmacy? No (Agent: If no, request that the patient contact the pharmacy for the refill. If patient does not wish to contact the pharmacy document the reason why and proceed with request.) (Agent: If yes, when and what did the pharmacy advise?)  Is this the correct pharmacy for this prescription? Yes If no, delete pharmacy and type the correct one.  This is the patient's preferred pharmacy:    [id number] [place], [place] - [address] [place] [address] Phone: [phone] Fax: [phone]   Has the prescription been filled recently? Yes  Is the patient out of the medication? Yes  Has the patient been seen for an appointment in the last year OR does the patient have an upcoming appointment? Yes  Can we respond through MyChart? Yes  Agent: Please be advised that Rx refills may take up to 3 business days. We ask that you follow-up with your pharmacy.

## 2024-01-09 ENCOUNTER — Encounter: Payer: 59 | Admitting: Internal Medicine

## 2024-01-09 NOTE — Telephone Encounter (Signed)
 Requested medication (s) are due for refill today: yes  Requested medication (s) are on the active medication list: yes  Last refill:  11/14/23  Future visit scheduled: yes  Notes to clinic Unable to refill per protocol, cannot delegate.     Requested Prescriptions  Pending Prescriptions Disp Refills   amphetamine-dextroamphetamine (ADDERALL) 10 MG tablet 60 tablet 0    Sig: Take 1 tablet (10 mg total) by mouth 2 (two) times daily with a meal.     Not Delegated - Psychiatry:  Stimulants/ADHD Failed - 01/09/2024  3:08 PM      Failed - This refill cannot be delegated      Failed - Urine Drug Screen completed in last 360 days      Failed - Valid encounter within last 6 months    Recent Outpatient Visits           4 years ago Cough   [facility], Manus Rudd, MD   5 years ago Essential hypertension   [facility] at [facility], [place], MD   5 years ago Attention deficit hyperactivity disorder (ADHD), combined type   [facility] at [facility], Manus Rudd, MD   5 years ago Idiopathic chronic pancreatitis [facility])   Primary Care at [facility], Levell July, MD   5 years ago Fibromyalgia   Primary Care at [facility], Levell July, MD       Future Appointments             In 1 week Baity, Salvadore Oxford, NP [place] [facility]            Passed - Last BP in normal range    BP Readings from Last 1 Encounters:  06/28/23 126/84         Passed - Last Heart Rate in normal range    Pulse Readings from Last 1 Encounters:  06/28/23 (!) 110               Requested Prescriptions  Pending Prescriptions Disp Refills   amphetamine-dextroamphetamine (ADDERALL) 10 MG tablet 60 tablet 0    Sig: Take 1 tablet (10 mg total) by mouth 2 (two) times daily with a meal.     Not Delegated - Psychiatry:  Stimulants/ADHD Failed - 01/09/2024  3:03 PM      Failed - This refill cannot be delegated      Failed - Urine Drug Screen completed in last 360 days       Failed - Valid encounter within last 6 months    Recent Outpatient Visits           4 years ago Cough   [facility] at [facility], Manus Rudd, MD   5 years ago Essential hypertension   [facility] at [facility], Manus Rudd, MD   5 years ago Attention deficit hyperactivity disorder (ADHD), combined type   [facility], Manus Rudd, MD   5 years ago Idiopathic chronic pancreatitis [facility])   Primary Care at [place], Levell July, MD   5 years ago Fibromyalgia   Primary Care at [facility], Levell July, MD       Future Appointments             In 1 week Baity, Salvadore Oxford, NP [facility]            Passed - Last BP in normal range  BP Readings from Last 1 Encounters:  06/28/23 126/84         Passed - Last Heart Rate in normal range    Pulse Readings from Last 1 Encounters:  06/28/23 (!) 110

## 2024-01-10 MED ORDER — AMPHETAMINE-DEXTROAMPHETAMINE 10 MG PO TABS
10.0000 mg | ORAL_TABLET | Freq: Two times a day (BID) | ORAL | 0 refills | Status: DC
Start: 2024-01-10 — End: 2024-03-29

## 2024-01-11 ENCOUNTER — Other Ambulatory Visit: Payer: Self-pay | Admitting: Internal Medicine

## 2024-01-12 NOTE — Telephone Encounter (Signed)
 Requested Prescriptions  Pending Prescriptions Disp Refills   esomeprazole (NEXIUM) 40 MG capsule [Pharmacy Med Name: ESOMEPRAZOLE MAGNESIUM 40MG  DR CAPS] 90 capsule 1    Sig: TAKE 1 CAPSULE(40 MG) BY MOUTH DAILY     Gastroenterology: Proton Pump Inhibitors 2 Failed - 01/12/2024  2:24 PM      Failed - Valid encounter within last 12 months    Recent Outpatient Visits           4 years ago Cough   [facility], Manus Rudd, MD   5 years ago Essential hypertension   [facility] at [facility], [place], MD   5 years ago Attention deficit hyperactivity disorder (ADHD), combined type   [facility] at [facility], Manus Rudd, MD   5 years ago Idiopathic chronic pancreatitis [facility])   Primary Care at [facility], Levell July, MD   5 years ago Fibromyalgia   Primary Care at [facility], Levell July, MD       Future Appointments             In 1 week Lorre Munroe, NP [place] [facility], [facility]            Passed - ALT in normal range and within 360 days    ALT  Date Value Ref Range Status  06/28/2023 13 6 - 29 U/L Final         Passed - AST in normal range and within 360 days    AST  Date Value Ref Range Status  06/28/2023 13 10 - 35 U/L Final

## 2024-01-20 ENCOUNTER — Encounter: Admitting: Internal Medicine

## 2024-01-20 NOTE — Progress Notes (Deleted)
 Subjective:    Patient ID: Gloria Carter, female    DOB: [birth year], 58 y.o.   MRN: [id number]  HPI  Patient presents to clinic today for her annual exam.  Flu: 06/2022 Tetanus: 11/2009 COVID: Never Pneumovax: 12/2022 Shingrix: Never Pap smear: > 5 years ago Mammogram: never Bone density: never Colon screening: never Vision screening: as needed Dentist: as needed  Diet: She does eat meat. She consumes fruits and veggies. She tries to avoid fried foods. She drinks mostly water, soda. Exercise: None  Review of Systems     Past Medical History:  Diagnosis Date   ADD (attention deficit disorder)    Allergy    ENVIRONMENTAL   Arthritis    knees   Asthma    Depression    Difficulty sleeping    Dysphagia    Fibromyalgia    GERD (gastroesophageal reflux disease)    Hernia of abdominal wall    Hyperlipidemia    Hypertension    IBS (irritable bowel syndrome)    Migraine    Numbness and tingling in hands    Obesity    Swelling of both ankles    Thyroid disease     Current Outpatient Medications  Medication Sig Dispense Refill   AIRSUPRA 90-80 MCG/ACT AERO INHALE 1 PUFF INTO THE LUNGS EVERY 4 HOURS AS NEEDED 10.7 g 2   albuterol (VENTOLIN HFA) 108 (90 Base) MCG/ACT inhaler INHALE 2 PUFFS BY MOUTH EVERY 6 HOURS AS NEEDED 8.5 g 1   amphetamine-dextroamphetamine (ADDERALL) 10 MG tablet Take 1 tablet (10 mg total) by mouth 2 (two) times daily with a meal. 60 tablet 0   aspirin EC 81 MG tablet Take 1 tablet (81 mg total) by mouth daily. Swallow whole. 30 tablet 12   azelastine (OPTIVAR) 0.05 % ophthalmic solution PLACE TWO DROPS INTO BOTH EYES TWICE DAILY FOR DRY EYES 6 mL 3   b complex vitamins tablet Take 1 tablet by mouth daily.     Biotin 1000 MCG CHEW Chew 5,000 mcg by mouth.     Calcium-Magnesium-Zinc 167-83-8 MG TABS Take by mouth.     carisoprodol (SOMA) 350 MG tablet TAKE 1 TABLET BY MOUTH FOUR TIMES DAILY AS NEEDED FOR MUSCLE SPASMS 90 tablet 0   cetirizine  (ZYRTEC) 10 MG tablet Take 10 mg by mouth daily as needed for allergies.      cholecalciferol (VITAMIN D) 400 UNITS TABS Take 400 Units by mouth daily.     citalopram (CELEXA) 20 MG tablet TAKE 1 TABLET(20 MG) BY MOUTH DAILY 90 tablet 1   CREON 36000-114000 units CPEP capsule TAKE 1 CAPSULES BY MOUTH AS DIRECTED 4 CAPSULES WITH A MEAL AND 1 CAPSULE WITH SNACKS 400 capsule 5   esomeprazole (NEXIUM) 40 MG capsule TAKE 1 CAPSULE(40 MG) BY MOUTH DAILY 90 capsule 1   ipratropium (ATROVENT) 0.03 % nasal spray Place 2 sprays into the nose 4 (four) times daily as needed for rhinitis. 30 mL 0   levothyroxine (SYNTHROID) 75 MCG tablet TAKE 1 TABLET BY MOUTH DAILY BEFORE BREAKFAST, ON AN EMPTY STOMACH WITH A SMALL GLASS OF WATER, NO MEDS OR FOOD FOR 20 MINUTES. 90 tablet 1   montelukast (SINGULAIR) 10 MG tablet TAKE 1 TABLET(10 MG) BY MOUTH EVERY MORNING 90 tablet 1   Multiple Vitamin (MULTIVITAMIN) tablet Take 1 tablet by mouth daily.     ondansetron (ZOFRAN) 8 MG tablet TAKE 1 TABLET(8 MG) BY MOUTH EVERY 4 HOURS AS NEEDED 20 tablet 0   pregabalin (LYRICA)  150 MG capsule TAKE 1 CAPSULE(150 MG) BY MOUTH THREE TIMES DAILY 90 capsule 0   SUMAtriptan (IMITREX) 100 MG tablet TAKE 1 TAB BY MOUTH EVERY 2 HOURS AS NEEDED FOR MIGRAINE. NO MORE THAN 2 DOSES IN 24 HOURS. 8 tablet 5   SYMBICORT 160-4.5 MCG/ACT inhaler INHALE 2 PUFFS BY MOUTH TWICE DAILY 10.2 g 2   telmisartan-hydrochlorothiazide (MICARDIS HCT) 80-25 MG tablet TAKE 1 TABLET BY MOUTH DAILY 90 tablet 1   traMADol (ULTRAM) 50 MG tablet TAKE 1 TABLET(50 MG) BY MOUTH THREE TIMES DAILY AS NEEDED 90 tablet 0   vitamin C (ASCORBIC ACID) 500 MG tablet Take 1,000 mg by mouth daily.     Vitamin D, Ergocalciferol, (DRISDOL) 1.25 MG (50000 UNIT) CAPS capsule Take 1 capsule (50,000 Units total) by mouth once a week. For 12 weeks. Then start OTC Vitamin D3 2,000 unit daily. 12 capsule 0   vitamin E 400 UNIT capsule Take 400 Units by mouth daily.     No current  facility-administered medications for this visit.    Allergies  Allergen Reactions   Soap     Any soaps with fragrance cause migraines   Sulfa Antibiotics Nausea And Vomiting    Family History  Problem Relation Age of Onset   COPD Mother    Fibromyalgia Mother    Heart disease Father    Colon polyps Father    Stroke Sister    Fibromyalgia Sister    Colon polyps Maternal Grandmother    Lung disease Paternal Grandmother    Stroke Paternal Grandmother    Diabetes Paternal Grandfather    Heart disease Paternal Grandfather    Colon polyps Maternal Aunt    Aortic aneurysm Maternal Aunt    Cystic kidney disease Maternal Aunt        kidney cyst   Kidney disease Maternal Aunt    Diabetes Maternal Uncle    Fibromyalgia Other        cousin   Colon cancer Neg Hx    Breast cancer Neg Hx     Social History   Socioeconomic History   Marital status: Divorced    Spouse name: Not on file   Number of children: 0   Years of education: Not on file   Highest education level: Not on file  Occupational History   Occupation: Child psychotherapist    Employer: [employer]  Tobacco Use   Smoking status: Never   Smokeless tobacco: Never  Substance and Sexual Activity   Alcohol use: No    Alcohol/week: 0.0 standard drinks of alcohol   Drug use: No   Sexual activity: Not on file  Other Topics Concern   Not on file  Social History Narrative   Married. Education: [employer].    Social Drivers of Corporate investment banker Strain: Not on file  Food Insecurity: Not on file  Transportation Needs: Not on file  Physical Activity: Not on file  Stress: Not on file  Social Connections: Not on file  Intimate Partner Violence: Not on file     Constitutional: Patient reports intermittent headaches, fatigue.  Denies fever, malaise, or abrupt weight changes.  HEENT: Denies eye pain, eye redness, ear pain, ringing in the ears, wax buildup, runny nose, nasal congestion, bloody nose, or sore  throat. Respiratory: Patient reports cough and shortness of breath.  Denies difficulty breathing.   Cardiovascular: Denies chest pain, chest tightness, palpitations or swelling in the hands or feet.  Gastrointestinal: Patient reports intermittent diarrhea.  Denies  abdominal pain, bloating, constipation, or blood in the stool.  GU: Denies urgency, frequency, pain with urination, burning sensation, blood in urine, odor or discharge. Musculoskeletal: Patient reports chronic joint and muscle pain.  Denies decrease in range of motion, difficulty with gait, or joint swelling.  Skin: Denies redness, rashes, lesions or ulcercations.  Neurological: Patient reports inattention.  Denies dizziness, difficulty with memory, difficulty with speech or problems with balance and coordination.  Psych: Patient has a history of anxiety and depression.  Denies SI/HI.  No other specific complaints in a complete review of systems (except as listed in HPI above).  Objective:   Physical Exam   There were no vitals taken for this visit.  Wt Readings from Last 3 Encounters:  06/28/23 290 lb (131.5 kg)  06/06/23 294 lb (133.4 kg)  01/07/23 282 lb (127.9 kg)    General: Appears her stated age, obese, in NAD. Skin: Warm, dry and intact.  HEENT: Head: normal shape and size; Eyes: sclera white, no icterus, conjunctiva pink, PERRLA and EOMs intact;  Neck:  Neck supple, trachea midline. No masses, lumps present.  Thyromegaly noted. Cardiovascular: Tachycardic with normal rhythm. S1,S2 noted.  No murmur, rubs or gallops noted. No JVD or BLE edema. No carotid bruits noted. Pulmonary/Chest: Normal effort and positive vesicular breath sounds. No respiratory distress. No wheezes, rales or ronchi noted.  Abdomen: Normal bowel sounds.  Musculoskeletal: Gait slow and steady with use of walker. Neurological: Alert and oriented. Cranial nerves II-XII grossly intact. Coordination normal.  Psychiatric: Mood and affect flat.  Behavior is normal. Judgment and thought content normal.     BMET    Component Value Date/Time   NA 135 06/28/2023 1534   NA 142 01/05/2019 1601   K 4.0 06/28/2023 1534   CL 96 (L) 06/28/2023 1534   CO2 29 06/28/2023 1534   GLUCOSE 94 06/28/2023 1534   BUN 22 06/28/2023 1534   BUN 12 01/05/2019 1601   CREATININE 1.12 (H) 06/28/2023 1534   CALCIUM 9.4 06/28/2023 1534   GFRNONAA >60 03/28/2020 1052   GFRNONAA >89 08/23/2014 1707   GFRAA >60 03/28/2020 1052   GFRAA >89 08/23/2014 1707    Lipid Panel     Component Value Date/Time   CHOL 270 (H) 06/28/2023 1534   CHOL 221 (H) 01/05/2019 1601   TRIG 207 (H) 06/28/2023 1534   HDL 57 06/28/2023 1534   HDL 41 01/05/2019 1601   CHOLHDL 4.7 06/28/2023 1534   VLDL 33 08/31/2013 0918   LDLCALC 176 (H) 06/28/2023 1534    CBC    Component Value Date/Time   WBC 13.1 (H) 06/28/2023 1534   RBC 4.24 06/28/2023 1534   HGB 12.7 06/28/2023 1534   HGB 12.8 06/15/2017 1722   HCT 37.2 06/28/2023 1534   HCT 38.2 06/15/2017 1722   PLT 225 06/28/2023 1534   PLT 216 06/15/2017 1722   MCV 87.7 06/28/2023 1534   MCV 90.5 01/23/2019 1400   MCV 88 06/15/2017 1722   MCH 30.0 06/28/2023 1534   MCHC 34.1 06/28/2023 1534   RDW 12.5 06/28/2023 1534   RDW 13.9 06/15/2017 1722   LYMPHSABS 2.1 03/28/2020 1052   LYMPHSABS 2.1 06/15/2017 1722   MONOABS 0.9 03/28/2020 1052   EOSABS 0.3 03/28/2020 1052   EOSABS 0.2 06/15/2017 1722   BASOSABS 0.1 03/28/2020 1052   BASOSABS 0.0 06/15/2017 1722    Hgb A1C Lab Results  Component Value Date   HGBA1C 6.1 (H) 06/28/2023  Assessment & Plan:   Preventative Health Maintenance:  Encouraged her to get a flu shot in the fall She declines tetanus for financial reasons, advised if she gets bit or cut to get this done Pneumovax Encouraged her to get her COVID-vaccine Discussed Shingrix vaccine, she will check coverage with her insurance company and schedule a visit if she would like to  have this done Referral to GYN for referral Pap smear Mammogram was ordered previously, she just needs to call and schedule this Bone density ordered-she will call to schedule Cologuard was sent to her house, advised her to complete this Encouraged her to consume a balanced diet and exercise regimen Asked her to see an eye doctor and dentist annually We will check CBC, c-Met, TSH, Free T4, lipid, A1c today  RTC in 6 months, follow-up chronic conditions Nicki Reaper, NP

## 2024-01-23 ENCOUNTER — Other Ambulatory Visit: Payer: Self-pay | Admitting: Internal Medicine

## 2024-01-23 DIAGNOSIS — M797 Fibromyalgia: Secondary | ICD-10-CM

## 2024-01-23 DIAGNOSIS — G894 Chronic pain syndrome: Secondary | ICD-10-CM

## 2024-01-24 NOTE — Telephone Encounter (Signed)
 Requested medications are due for refill today.  yes  Requested medications are on the active medications list.  yes  Last refill. 12/01/2023 #90 0 rf for all 3  Future visit scheduled.   no  Notes to clinic.  Refill/refusal not delegated.    Requested Prescriptions  Pending Prescriptions Disp Refills   carisoprodol (SOMA) 350 MG tablet [Pharmacy Med Name: CARISOPRODOL 350MG  TABLETS] 90 tablet     Sig: TAKE 1 TABLET BY MOUTH FOUR TIMES DAILY AS NEEDED FOR MUSCLE SPASMS     Not Delegated - Analgesics:  Muscle Relaxants Failed - 01/24/2024 11:39 AM      Failed - This refill cannot be delegated      Failed - Valid encounter within last 6 months    Recent Outpatient Visits           5 years ago Cough   Primary Care at [facility], Manus Rudd, MD   5 years ago Essential hypertension   Primary Care at [facility], Manus Rudd, MD   5 years ago Attention deficit hyperactivity disorder (ADHD), combined type   Primary Care at [facility], Manus Rudd, MD   5 years ago Idiopathic chronic pancreatitis [facility])   [facility] at [facility], Levell July, MD   5 years ago Fibromyalgia   [facility] at [facility], Levell July, MD               pregabalin (LYRICA) 150 MG capsule [Pharmacy Med Name: PREGABALIN 150MG  CAPSULES] 90 capsule     Sig: TAKE 1 CAPSULE(150 MG) BY MOUTH THREE TIMES DAILY     Not Delegated - Neurology:  Anticonvulsants - Controlled - pregabalin Failed - 01/24/2024 11:39 AM      Failed - This refill cannot be delegated      Failed - Cr in normal range and within 360 days    Creat  Date Value Ref Range Status  06/28/2023 1.12 (H) 0.50 - 1.03 mg/dL Final         Failed - Valid encounter within last 12 months    Recent Outpatient Visits           5 years ago Cough   Primary Care at [facility], Manus Rudd, MD   5 years ago Essential hypertension   Primary Care at [facility], Manus Rudd, MD   5 years ago Attention deficit hyperactivity disorder (ADHD), combined type    Primary Care at [facility], Manus Rudd, MD   5 years ago Idiopathic chronic pancreatitis [facility])   [facility] at [facility], Levell July, MD   5 years ago Fibromyalgia   [facility] at [facility], Levell July, MD              Passed - Completed PHQ-2 or PHQ-9 in the last 360 days       traMADol (ULTRAM) 50 MG tablet [Pharmacy Med Name: TRAMADOL 50MG  TABLETS] 90 tablet     Sig: TAKE 1 TABLET(50 MG) BY MOUTH THREE TIMES DAILY AS NEEDED     Not Delegated - Analgesics:  Opioid Agonists Failed - 01/24/2024 11:39 AM      Failed - This refill cannot be delegated      Failed - Urine Drug Screen completed in last 360 days      Failed - Valid encounter within last 3 months    Recent Outpatient Visits           5 years ago Cough   Primary Care at  Donavon Fudge, MD   5 years ago Essential hypertension   Primary Care at [facility], Cathye Coca, MD   5 years ago Attention deficit hyperactivity disorder (ADHD), combined type   Primary Care at [facility], Cathye Coca, MD   5 years ago Idiopathic chronic pancreatitis [facility])   [facility] at [facility], Edison Gore, MD   5 years ago Fibromyalgia   [facility] at [facility], Edison Gore, MD

## 2024-02-06 ENCOUNTER — Encounter: Admitting: Family Medicine

## 2024-02-06 ENCOUNTER — Other Ambulatory Visit: Payer: Self-pay | Admitting: Internal Medicine

## 2024-02-08 NOTE — Telephone Encounter (Signed)
 Requested Prescriptions  Pending Prescriptions Disp Refills   montelukast  (SINGULAIR ) 10 MG tablet [Pharmacy Med Name: MONTELUKAST  10MG  TABLETS] 90 tablet 1    Sig: TAKE 1 TABLET(10 MG) BY MOUTH EVERY MORNING     Pulmonology:  Leukotriene Inhibitors Failed - 02/08/2024  8:38 AM      Failed - Valid encounter within last 12 months    Recent Outpatient Visits           5 years ago Cough   Primary Care at [facility], Cathye Coca, MD   5 years ago Essential hypertension   Primary Care at [facility] [place], MD   5 years ago Attention deficit hyperactivity disorder (ADHD), combined type   Primary Care at [facility], Cathye Coca, MD   5 years ago Idiopathic chronic pancreatitis [facility])   [facility] at [facility], Edison Gore, MD   5 years ago Fibromyalgia   [facility] at [facility], Edison Gore, MD

## 2024-02-10 ENCOUNTER — Encounter: Payer: Self-pay | Admitting: Internal Medicine

## 2024-02-10 ENCOUNTER — Other Ambulatory Visit: Payer: Self-pay | Admitting: Internal Medicine

## 2024-02-10 ENCOUNTER — Ambulatory Visit (INDEPENDENT_AMBULATORY_CARE_PROVIDER_SITE_OTHER): Admitting: Internal Medicine

## 2024-02-10 VITALS — BP 128/84 | Ht 62.0 in | Wt 293.8 lb

## 2024-02-10 DIAGNOSIS — R051 Acute cough: Secondary | ICD-10-CM

## 2024-02-10 DIAGNOSIS — Z23 Encounter for immunization: Secondary | ICD-10-CM

## 2024-02-10 DIAGNOSIS — R7303 Prediabetes: Secondary | ICD-10-CM | POA: Diagnosis not present

## 2024-02-10 DIAGNOSIS — E782 Mixed hyperlipidemia: Secondary | ICD-10-CM

## 2024-02-10 DIAGNOSIS — Z0001 Encounter for general adult medical examination with abnormal findings: Secondary | ICD-10-CM | POA: Diagnosis not present

## 2024-02-10 DIAGNOSIS — Z1211 Encounter for screening for malignant neoplasm of colon: Secondary | ICD-10-CM

## 2024-02-10 DIAGNOSIS — E039 Hypothyroidism, unspecified: Secondary | ICD-10-CM | POA: Diagnosis not present

## 2024-02-10 DIAGNOSIS — G894 Chronic pain syndrome: Secondary | ICD-10-CM

## 2024-02-10 DIAGNOSIS — Z124 Encounter for screening for malignant neoplasm of cervix: Secondary | ICD-10-CM

## 2024-02-10 DIAGNOSIS — M797 Fibromyalgia: Secondary | ICD-10-CM

## 2024-02-10 LAB — POC COVID19/FLU A&B COMBO
Covid Antigen, POC: NEGATIVE
Influenza A Antigen, POC: NEGATIVE
Influenza B Antigen, POC: NEGATIVE

## 2024-02-10 MED ORDER — TRAMADOL HCL 50 MG PO TABS: ORAL_TABLET | ORAL | 0 refills | Status: DC

## 2024-02-10 MED ORDER — CARISOPRODOL 350 MG PO TABS: ORAL_TABLET | ORAL | 0 refills | Status: DC

## 2024-02-10 MED ORDER — PREGABALIN 150 MG PO CAPS: ORAL_CAPSULE | ORAL | 0 refills | Status: DC

## 2024-02-10 NOTE — Progress Notes (Signed)
 Subjective:    Patient ID: Gloria Carter, female    DOB: [birth year], 58 y.o.   MRN: [id number]  HPI  Patient presents to clinic today for her annual exam.  Flu: 06/2023 Tetanus: 11/2009 COVID: Never Pneumovax: 12/2022 Shingrix: Never Pap smear: > 5 years ago Mammogram: never Bone density: never Colon screening: never Vision screening: as needed Dentist: as needed  Diet: She does eat meat. She consumes fruits and veggies. She tries to avoid fried foods. She drinks mostly water, soda. Exercise: None  Review of Systems     Past Medical History:  Diagnosis Date   ADD (attention deficit disorder)    Allergy    ENVIRONMENTAL   Arthritis    knees   Asthma    Depression    Difficulty sleeping    Dysphagia    Fibromyalgia    GERD (gastroesophageal reflux disease)    Hernia of abdominal wall    Hyperlipidemia    Hypertension    IBS (irritable bowel syndrome)    Migraine    Numbness and tingling in hands    Obesity    Swelling of both ankles    Thyroid  disease     Current Outpatient Medications  Medication Sig Dispense Refill   AIRSUPRA  90-80 MCG/ACT AERO INHALE 1 PUFF INTO THE LUNGS EVERY 4 HOURS AS NEEDED 10.7 g 2   albuterol  (VENTOLIN  HFA) 108 (90 Base) MCG/ACT inhaler INHALE 2 PUFFS BY MOUTH EVERY 6 HOURS AS NEEDED 8.5 g 1   amphetamine -dextroamphetamine  (ADDERALL) 10 MG tablet Take 1 tablet (10 mg total) by mouth 2 (two) times daily with a meal. 60 tablet 0   aspirin  EC 81 MG tablet Take 1 tablet (81 mg total) by mouth daily. Swallow whole. 30 tablet 12   azelastine  (OPTIVAR ) 0.05 % ophthalmic solution PLACE TWO DROPS INTO BOTH EYES TWICE DAILY FOR DRY EYES 6 mL 3   b complex vitamins tablet Take 1 tablet by mouth daily.     Biotin 1000 MCG CHEW Chew 5,000 mcg by mouth.     Calcium-Magnesium -Zinc 167-83-8 MG TABS Take by mouth.     carisoprodol  (SOMA ) 350 MG tablet TAKE 1 TABLET BY MOUTH FOUR TIMES DAILY AS NEEDED FOR MUSCLE SPASMS 90 tablet 0   cetirizine  (ZYRTEC) 10 MG tablet Take 10 mg by mouth daily as needed for allergies.      cholecalciferol (VITAMIN D ) 400 UNITS TABS Take 400 Units by mouth daily.     citalopram  (CELEXA ) 20 MG tablet TAKE 1 TABLET(20 MG) BY MOUTH DAILY 90 tablet 1   CREON  36000-114000 units CPEP capsule TAKE 1 CAPSULES BY MOUTH AS DIRECTED 4 CAPSULES WITH A MEAL AND 1 CAPSULE WITH SNACKS 400 capsule 5   esomeprazole  (NEXIUM ) 40 MG capsule TAKE 1 CAPSULE(40 MG) BY MOUTH DAILY 90 capsule 1   ipratropium (ATROVENT ) 0.03 % nasal spray Place 2 sprays into the nose 4 (four) times daily as needed for rhinitis. 30 mL 0   levothyroxine  (SYNTHROID ) 75 MCG tablet TAKE 1 TABLET BY MOUTH DAILY BEFORE BREAKFAST, ON AN EMPTY STOMACH WITH A SMALL GLASS OF WATER, NO MEDS OR FOOD FOR 20 MINUTES. 90 tablet 1   montelukast  (SINGULAIR ) 10 MG tablet TAKE 1 TABLET(10 MG) BY MOUTH EVERY MORNING 90 tablet 0   Multiple Vitamin (MULTIVITAMIN) tablet Take 1 tablet by mouth daily.     ondansetron  (ZOFRAN ) 8 MG tablet TAKE 1 TABLET(8 MG) BY MOUTH EVERY 4 HOURS AS NEEDED 20 tablet 0   pregabalin  (LYRICA )  150 MG capsule TAKE 1 CAPSULE(150 MG) BY MOUTH THREE TIMES DAILY 90 capsule 0   SUMAtriptan  (IMITREX ) 100 MG tablet TAKE 1 TAB BY MOUTH EVERY 2 HOURS AS NEEDED FOR MIGRAINE. NO MORE THAN 2 DOSES IN 24 HOURS. 8 tablet 5   SYMBICORT  160-4.5 MCG/ACT inhaler INHALE 2 PUFFS BY MOUTH TWICE DAILY 10.2 g 2   telmisartan -hydrochlorothiazide  (MICARDIS  HCT) 80-25 MG tablet TAKE 1 TABLET BY MOUTH DAILY 90 tablet 1   traMADol  (ULTRAM ) 50 MG tablet TAKE 1 TABLET(50 MG) BY MOUTH THREE TIMES DAILY AS NEEDED 90 tablet 0   vitamin C (ASCORBIC ACID) 500 MG tablet Take 1,000 mg by mouth daily.     Vitamin D , Ergocalciferol , (DRISDOL ) 1.25 MG (50000 UNIT) CAPS capsule Take 1 capsule (50,000 Units total) by mouth once a week. For 12 weeks. Then start OTC Vitamin D3 2,000 unit daily. 12 capsule 0   vitamin E 400 UNIT capsule Take 400 Units by mouth daily.     No current  facility-administered medications for this visit.    Allergies  Allergen Reactions   Soap     Any soaps with fragrance cause migraines   Sulfa Antibiotics Nausea And Vomiting    Family History  Problem Relation Age of Onset   COPD Mother    Fibromyalgia Mother    Heart disease Father    Colon polyps Father    Stroke Sister    Fibromyalgia Sister    Colon polyps Maternal Grandmother    Lung disease Paternal Grandmother    Stroke Paternal Grandmother    Diabetes Paternal Grandfather    Heart disease Paternal Grandfather    Colon polyps Maternal Aunt    Aortic aneurysm Maternal Aunt    Cystic kidney disease Maternal Aunt        kidney cyst   Kidney disease Maternal Aunt    Diabetes Maternal Uncle    Fibromyalgia Other        cousin   Colon cancer Neg Hx    Breast cancer Neg Hx     Social History   Socioeconomic History   Marital status: Divorced    Spouse name: Not on file   Number of children: 0   Years of education: Not on file   Highest education level: Not on file  Occupational History   Occupation: Child psychotherapist    Employer: [employer]  Tobacco Use   Smoking status: Never   Smokeless tobacco: Never  Substance and Sexual Activity   Alcohol  use: No    Alcohol /week: 0.0 standard drinks of alcohol    Drug use: No   Sexual activity: Not on file  Other Topics Concern   Not on file  Social History Narrative   Married. Education: [employer].    Social Drivers of Corporate investment banker Strain: Not on file  Food Insecurity: Not on file  Transportation Needs: Not on file  Physical Activity: Not on file  Stress: Not on file  Social Connections: Not on file  Intimate Partner Violence: Not on file     Constitutional: Patient reports intermittent headaches, fatigue.  Denies fever, malaise, or abrupt weight changes.  HEENT: Denies eye pain, eye redness, ear pain, ringing in the ears, wax buildup, runny nose, nasal congestion, bloody nose, or sore  throat. Respiratory: Patient reports cough and shortness of breath.  Denies difficulty breathing.   Cardiovascular: Denies chest pain, chest tightness, palpitations or swelling in the hands or feet.  Gastrointestinal: Patient reports intermittent diarrhea.  Denies  abdominal pain, bloating, constipation, or blood in the stool.  GU: Denies urgency, frequency, pain with urination, burning sensation, blood in urine, odor or discharge. Musculoskeletal: Patient reports chronic joint and muscle pain.  Denies decrease in range of motion, difficulty with gait, or joint swelling.  Skin: Denies redness, rashes, lesions or ulcercations.  Neurological: Patient reports inattention.  Denies dizziness, difficulty with memory, difficulty with speech or problems with balance and coordination.  Psych: Patient has a history of anxiety and depression.  Denies SI/HI.  No other specific complaints in a complete review of systems (except as listed in HPI above).  Objective:   Physical Exam  BP 128/84 (BP Location: Left Arm, Patient Position: Sitting, Cuff Size: Large)   Ht 5\' 2"  (1.575 m)   Wt 293 lb 12.8 oz (133.3 kg)   BMI 53.74 kg/m    Wt Readings from Last 3 Encounters:  06/28/23 290 lb (131.5 kg)  06/06/23 294 lb (133.4 kg)  01/07/23 282 lb (127.9 kg)    General: Appears her stated age, obese, in NAD. Skin: Warm, dry and intact. Multiple scabs noted of left upper extremity. HEENT: Head: normal shape and size; Eyes: sclera white, no icterus, conjunctiva pink, PERRLA and EOMs intact;  Neck:  Neck supple, trachea midline. No masses, lumps present.  Thyromegaly noted. Cardiovascular: Tachycardic with normal rhythm. S1,S2 noted.  No murmur, rubs or gallops noted. No JVD or BLE edema. No carotid bruits noted. Pulmonary/Chest: Normal effort and positive vesicular breath sounds. No respiratory distress. No wheezes, rales or ronchi noted.  Abdomen: Normal bowel sounds.  Musculoskeletal: Gait slow and steady  with use of walker. Neurological: Alert and oriented. Cranial nerves II-XII grossly intact. Coordination normal.  Psychiatric: Mood and affect flat. Behavior is normal. Judgment and thought content normal.     BMET    Component Value Date/Time   NA 135 06/28/2023 1534   NA 142 01/05/2019 1601   K 4.0 06/28/2023 1534   CL 96 (L) 06/28/2023 1534   CO2 29 06/28/2023 1534   GLUCOSE 94 06/28/2023 1534   BUN 22 06/28/2023 1534   BUN 12 01/05/2019 1601   CREATININE 1.12 (H) 06/28/2023 1534   CALCIUM 9.4 06/28/2023 1534   GFRNONAA >60 03/28/2020 1052   GFRNONAA >89 08/23/2014 1707   GFRAA >60 03/28/2020 1052   GFRAA >89 08/23/2014 1707    Lipid Panel     Component Value Date/Time   CHOL 270 (H) 06/28/2023 1534   CHOL 221 (H) 01/05/2019 1601   TRIG 207 (H) 06/28/2023 1534   HDL 57 06/28/2023 1534   HDL 41 01/05/2019 1601   CHOLHDL 4.7 06/28/2023 1534   VLDL 33 08/31/2013 0918   LDLCALC 176 (H) 06/28/2023 1534    CBC    Component Value Date/Time   WBC 13.1 (H) 06/28/2023 1534   RBC 4.24 06/28/2023 1534   HGB 12.7 06/28/2023 1534   HGB 12.8 06/15/2017 1722   HCT 37.2 06/28/2023 1534   HCT 38.2 06/15/2017 1722   PLT 225 06/28/2023 1534   PLT 216 06/15/2017 1722   MCV 87.7 06/28/2023 1534   MCV 90.5 01/23/2019 1400   MCV 88 06/15/2017 1722   MCH 30.0 06/28/2023 1534   MCHC 34.1 06/28/2023 1534   RDW 12.5 06/28/2023 1534   RDW 13.9 06/15/2017 1722   LYMPHSABS 2.1 03/28/2020 1052   LYMPHSABS 2.1 06/15/2017 1722   MONOABS 0.9 03/28/2020 1052   EOSABS 0.3 03/28/2020 1052   EOSABS 0.2 06/15/2017 1722  BASOSABS 0.1 03/28/2020 1052   BASOSABS 0.0 06/15/2017 1722    Hgb A1C Lab Results  Component Value Date   HGBA1C 6.1 (H) 06/28/2023           Assessment & Plan:   Preventative Health Maintenance:  Encouraged her to get a flu shot in the fall Tdap today Pneumovax UTD Encouraged her to get her COVID-vaccine Discussed Shingrix vaccine, she will check  coverage with her insurance company and schedule a visit if she would like to have this done Referral to GYN for referral Pap smear Mammogram and bone density declined today Cologuard ordered Encouraged her to consume a balanced diet and exercise regimen Asked her to see an eye doctor and dentist annually We will check CBC, c-Met, TSH, Free T4, lipid, A1c today  RTC in 6 months, follow-up chronic conditions Helayne Lo, NP

## 2024-02-10 NOTE — Assessment & Plan Note (Signed)
 Encourage diet and exercise for weight loss

## 2024-02-10 NOTE — Patient Instructions (Signed)
 Health Maintenance for Postmenopausal Women Menopause is a normal process in which your ability to get pregnant comes to an end. This process happens slowly over many months or years, usually between the ages of 24 and 62. Menopause is complete when you have missed your menstrual period for 12 months. It is important to talk with your health care provider about some of the most common conditions that affect women after menopause (postmenopausal women). These include heart disease, cancer, and bone loss (osteoporosis). Adopting a healthy lifestyle and getting preventive care can help to promote your health and wellness. The actions you take can also lower your chances of developing some of these common conditions. What are the signs and symptoms of menopause? During menopause, you may have the following symptoms: Hot flashes. These can be moderate or severe. Night sweats. Decrease in sex drive. Mood swings. Headaches. Tiredness (fatigue). Irritability. Memory problems. Problems falling asleep or staying asleep. Talk with your health care provider about treatment options for your symptoms. Do I need hormone replacement therapy? Hormone replacement therapy is effective in treating symptoms that are caused by menopause, such as hot flashes and night sweats. Hormone replacement carries certain risks, especially as you become older. If you are thinking about using estrogen or estrogen with progestin, discuss the benefits and risks with your health care provider. How can I reduce my risk for heart disease and stroke? The risk of heart disease, heart attack, and stroke increases as you age. One of the causes may be a change in the body's hormones during menopause. This can affect how your body uses dietary fats, triglycerides, and cholesterol. Heart attack and stroke are medical emergencies. There are many things that you can do to help prevent heart disease and stroke. Watch your blood pressure High  blood pressure causes heart disease and increases the risk of stroke. This is more likely to develop in people who have high blood pressure readings or are overweight. Have your blood pressure checked: Every 3-5 years if you are 50-75 years of age. Every year if you are 77 years old or older. Eat a healthy diet  Eat a diet that includes plenty of vegetables, fruits, low-fat dairy products, and lean protein. Do not eat a lot of foods that are high in solid fats, added sugars, or sodium. Get regular exercise Get regular exercise. This is one of the most important things you can do for your health. Most adults should: Try to exercise for at least 150 minutes each week. The exercise should increase your heart rate and make you sweat (moderate-intensity exercise). Try to do strengthening exercises at least twice each week. Do these in addition to the moderate-intensity exercise. Spend less time sitting. Even light physical activity can be beneficial. Other tips Work with your health care provider to achieve or maintain a healthy weight. Do not use any products that contain nicotine or tobacco. These products include cigarettes, chewing tobacco, and vaping devices, such as e-cigarettes. If you need help quitting, ask your health care provider. Know your numbers. Ask your health care provider to check your cholesterol and your blood sugar (glucose). Continue to have your blood tested as directed by your health care provider. Do I need screening for cancer? Depending on your health history and family history, you may need to have cancer screenings at different stages of your life. This may include screening for: Breast cancer. Cervical cancer. Lung cancer. Colorectal cancer. What is my risk for osteoporosis? After menopause, you may be  at increased risk for osteoporosis. Osteoporosis is a condition in which bone destruction happens more quickly than new bone creation. To help prevent osteoporosis or  the bone fractures that can happen because of osteoporosis, you may take the following actions: If you are 61-3 years old, get at least 1,000 mg of calcium and at least 600 international units (IU) of vitamin D per day. If you are older than age 61 but younger than age 75, get at least 1,200 mg of calcium and at least 600 international units (IU) of vitamin D per day. If you are older than age 62, get at least 1,200 mg of calcium and at least 800 international units (IU) of vitamin D per day. Smoking and drinking excessive alcohol increase the risk of osteoporosis. Eat foods that are rich in calcium and vitamin D, and do weight-bearing exercises several times each week as directed by your health care provider. How does menopause affect my mental health? Depression may occur at any age, but it is more common as you become older. Common symptoms of depression include: Feeling depressed. Changes in sleep patterns. Changes in appetite or eating patterns. Feeling an overall lack of motivation or enjoyment of activities that you previously enjoyed. Frequent crying spells. Talk with your health care provider if you think that you are experiencing any of these symptoms. General instructions See your health care provider for regular wellness exams and vaccines. This may include: Scheduling regular health, dental, and eye exams. Getting and maintaining your vaccines. These include: Influenza vaccine. Get this vaccine each year before the flu season begins. Pneumonia vaccine. Shingles vaccine. Tetanus, diphtheria, and pertussis (Tdap) booster vaccine. Your health care provider may also recommend other immunizations. Tell your health care provider if you have ever been abused or do not feel safe at home. Summary Menopause is a normal process in which your ability to get pregnant comes to an end. This condition causes hot flashes, night sweats, decreased interest in sex, mood swings, headaches, or lack  of sleep. Treatment for this condition may include hormone replacement therapy. Take actions to keep yourself healthy, including exercising regularly, eating a healthy diet, watching your weight, and checking your blood pressure and blood sugar levels. Get screened for cancer and depression. Make sure that you are up to date with all your vaccines. This information is not intended to replace advice given to you by your health care provider. Make sure you discuss any questions you have with your health care provider. Document Revised: 02/16/2021 Document Reviewed: 02/16/2021 Elsevier Patient Education  2024 [employer].

## 2024-02-11 LAB — LIPID PANEL
Cholesterol: 240 mg/dL — ABNORMAL HIGH (ref <200)
HDL: 54 mg/dL (ref >=50)
LDL Cholesterol (Calc): 158 mg/dL — ABNORMAL HIGH
Non-HDL Cholesterol (Calc): 186 mg/dL — ABNORMAL HIGH (ref <130)
Total CHOL/HDL Ratio: 4.4 (calc) (ref <5.0)
Triglycerides: 149 mg/dL (ref <150)

## 2024-02-11 LAB — COMPREHENSIVE METABOLIC PANEL WITH GFR
AG Ratio: 1.4 (calc) (ref 1.0–2.5)
ALT: 13 U/L (ref 6–29)
AST: 17 U/L (ref 10–35)
Albumin: 4.2 g/dL (ref 3.6–5.1)
Alkaline phosphatase (APISO): 86 U/L (ref 37–153)
BUN: 22 mg/dL (ref 7–25)
CO2: 29 mmol/L (ref 20–32)
Calcium: 9.6 mg/dL (ref 8.6–10.4)
Chloride: 89 mmol/L — ABNORMAL LOW (ref 98–110)
Creat: 0.9 mg/dL (ref 0.50–1.05)
Globulin: 3 g/dL (ref 1.9–3.7)
Glucose, Bld: 97 mg/dL (ref 65–99)
Potassium: 3.8 mmol/L (ref 3.5–5.3)
Sodium: 128 mmol/L — ABNORMAL LOW (ref 135–146)
Total Bilirubin: 0.5 mg/dL (ref 0.2–1.2)
Total Protein: 7.2 g/dL (ref 6.1–8.1)
eGFR: 73 mL/min/{1.73_m2} (ref >=60)

## 2024-02-11 LAB — CBC
HCT: 36.1 % (ref 35.0–45.0)
Hemoglobin: 12.3 g/dL (ref 11.7–15.5)
MCH: 29.4 pg (ref 27.0–33.0)
MCHC: 34.1 g/dL (ref 32.0–36.0)
MCV: 86.2 fL (ref 80.0–100.0)
MPV: 12.5 fL (ref 7.5–12.5)
Platelets: 184 10*3/uL (ref 140–400)
RBC: 4.19 10*6/uL (ref 3.80–5.10)
RDW: 12.2 % (ref 11.0–15.0)
WBC: 9.9 10*3/uL (ref 3.8–10.8)

## 2024-02-11 LAB — HEMOGLOBIN A1C
Hgb A1c MFr Bld: 5.8 % — ABNORMAL HIGH (ref <5.7)
Mean Plasma Glucose: 120 mg/dL
eAG (mmol/L): 6.6 mmol/L

## 2024-02-11 LAB — T4, FREE: Free T4: 1.7 ng/dL (ref 0.8–1.8)

## 2024-02-11 LAB — TSH: TSH: 2.52 m[IU]/L (ref 0.40–4.50)

## 2024-02-13 ENCOUNTER — Encounter: Payer: Self-pay | Admitting: Internal Medicine

## 2024-02-13 NOTE — Telephone Encounter (Signed)
 Requested Prescriptions  Pending Prescriptions Disp Refills   levothyroxine  (SYNTHROID ) 75 MCG tablet [Pharmacy Med Name: LEVOTHYROXINE  0.075MG  ( ) TABS] 90 tablet 1    Sig: TAKE 1 TABLET BY MOUTH DAILY BEFORE BREAKFAST, ON AN EMPTY STOMACH WITH A SMALL GLASS OF WATER, NO MEDS OR FOOD FOR 20 MINUTES.     Endocrinology:  Hypothyroid Agents Failed - 02/13/2024  2:51 PM      Failed - Valid encounter within last 12 months    Recent Outpatient Visits           3 days ago Encounter for general adult medical examination with abnormal findings   [facility], Rankin Buzzard, NP   5 years ago Cough   [facility] at [facility], Cathye Coca, MD   5 years ago Essential hypertension   Primary Care at [facility], [place], [place]   5 years ago Attention deficit hyperactivity disorder (ADHD), combined type   [facility] at [facility], Cathye Coca, MD   5 years ago Idiopathic chronic pancreatitis [facility])   [facility] at [facility], Edison Gore, MD              Passed - TSH in normal range and within 360 days    TSH  Date Value Ref Range Status  02/10/2024 2.52 0.40 - 4.50 mIU/L Final          citalopram  (CELEXA ) 20 MG tablet [Pharmacy Med Name: CITALOPRAM  20MG  TABLETS] 90 tablet 1    Sig: TAKE 1 TABLET(20 MG) BY MOUTH DAILY     Psychiatry:  Antidepressants - SSRI Failed - 02/13/2024  2:51 PM      Failed - Valid encounter within last 6 months    Recent Outpatient Visits           3 days ago Encounter for general adult medical examination with abnormal findings   [facility], Rankin Buzzard, NP   5 years ago Cough   [facility] at [facility], Cathye Coca, MD   5 years ago Essential hypertension   Primary Care at [facility], Cathye Coca, MD   5 years ago Attention deficit hyperactivity disorder (ADHD), combined type   Primary Care at [facility], Cathye Coca, MD   5 years ago Idiopathic chronic pancreatitis [facility])   [facility] at  [facility], Edison Gore, MD              Passed - Completed PHQ-2 or PHQ-9 in the last 360 days

## 2024-03-10 ENCOUNTER — Other Ambulatory Visit: Payer: Self-pay | Admitting: Internal Medicine

## 2024-03-10 DIAGNOSIS — I10 Essential (primary) hypertension: Secondary | ICD-10-CM

## 2024-03-12 NOTE — Telephone Encounter (Signed)
 Requested Prescriptions  Pending Prescriptions Disp Refills   telmisartan -hydrochlorothiazide  (MICARDIS  HCT) 80-25 MG tablet [Pharmacy Med Name: TELMISARTAN /HCTZ 80-25MG  TABS] 90 tablet 1    Sig: TAKE 1 TABLET BY MOUTH DAILY     Cardiovascular: ARB + Diuretic Combos Failed - 03/12/2024 12:46 PM      Failed - Na in normal range and within 180 days    Sodium  Date Value Ref Range Status  02/10/2024 128 (L) 135 - 146 mmol/L Final  01/05/2019 142 134 - 144 mmol/L Final         Failed - Valid encounter within last 6 months    Recent Outpatient Visits           1 month ago Encounter for general adult medical examination with abnormal findings   [facility] [place], Rankin Buzzard, NP   5 years ago Cough   Primary Care at [facility], Cathye Coca, MD   5 years ago Essential hypertension   Primary Care at [facility], [place], MD   5 years ago Attention deficit hyperactivity disorder (ADHD), combined type   Primary Care at [facility], Cathye Coca, MD   5 years ago Idiopathic chronic pancreatitis [facility])   [facility] at [facility], Edison Gore, MD              Passed - K in normal range and within 180 days    Potassium  Date Value Ref Range Status  02/10/2024 3.8 3.5 - 5.3 mmol/L Final         Passed - Cr in normal range and within 180 days    Creat  Date Value Ref Range Status  02/10/2024 0.90 0.50 - 1.05 mg/dL Final         Passed - eGFR is 10 or above and within 180 days    GFR, Est African American  Date Value Ref Range Status  08/23/2014 >89 mL/min Final   GFR calc Af Amer  Date Value Ref Range Status  03/28/2020 >60 >60 mL/min Final   GFR, Est Non African American  Date Value Ref Range Status  08/23/2014 >89 mL/min Final    Comment:      The estimated GFR is a calculation valid for adults (>=73 years old) that uses the CKD-EPI algorithm to adjust for age and sex. It is   not to be used for children, pregnant women, hospitalized patients,     patients on dialysis, or with rapidly changing kidney function. According to the NKDEP, eGFR >89 is normal, 60-89 shows mild impairment, 30-59 shows moderate impairment, 15-29 shows severe impairment and <15 is ESRD.      GFR calc non Af Amer  Date Value Ref Range Status  03/28/2020 >60 >60 mL/min Final   GFR  Date Value Ref Range Status  01/05/2022 85.26 >60.00 mL/min Final    Comment:    Calculated using the CKD-EPI Creatinine Equation (2021)   eGFR  Date Value Ref Range Status  02/10/2024 73 > OR = 60 mL/min/1.34m2 Final         Passed - Patient is not pregnant      Passed - Last BP in normal range    BP Readings from Last 1 Encounters:  02/10/24 128/84

## 2024-03-14 ENCOUNTER — Other Ambulatory Visit: Payer: Self-pay | Admitting: Internal Medicine

## 2024-03-15 NOTE — Telephone Encounter (Signed)
 Requested Prescriptions  Pending Prescriptions Disp Refills   SYMBICORT  160-4.5 MCG/ACT inhaler [Pharmacy Med Name: SYMBICORT  160/4. (120 ORAL INH)] 10.2 g 2    Sig: INHALE 2 PUFFS BY MOUTH TWICE DAILY     Pulmonology:  Combination Products Failed - 03/15/2024  5:48 PM      Failed - Valid encounter within last 12 months    Recent Outpatient Visits           1 month ago Encounter for general adult medical examination with abnormal findings   [place] [facility], Rankin Buzzard, NP   5 years ago Cough   Primary Care at [facility], Cathye Coca, MD   5 years ago Essential hypertension   Primary Care at [facility], [place], MD   5 years ago Attention deficit hyperactivity disorder (ADHD), combined type   Primary Care at [facility], Cathye Coca, MD   5 years ago Idiopathic chronic pancreatitis [facility])   [facility] at [facility], Edison Gore, MD

## 2024-03-19 ENCOUNTER — Other Ambulatory Visit: Payer: Self-pay | Admitting: Internal Medicine

## 2024-03-19 DIAGNOSIS — G894 Chronic pain syndrome: Secondary | ICD-10-CM

## 2024-03-19 DIAGNOSIS — M797 Fibromyalgia: Secondary | ICD-10-CM

## 2024-03-20 NOTE — Telephone Encounter (Signed)
 Requested medication (s) are due for refill today: yes   Requested medication (s) are on the active medication list: yes   Last refill:  02/10/24 #90 0 refills  Future visit scheduled: yes on 08/17/24  Notes to clinic:  not delegated per protocol. Do you want to refill Rxs? Patient also requesting zofran  8 mg to be refilled. Medication discontinued 02/10/24.      Requested Prescriptions  Pending Prescriptions Disp Refills   carisoprodol  (SOMA ) 350 MG tablet [Pharmacy Med Name: CARISOPRODOL  350MG  TABLETS] 90 tablet     Sig: TAKE 1 TABLET BY MOUTH FOUR TIMES DAILY AS NEEDED FOR MUSCLE SPASMS     Not Delegated - Analgesics:  Muscle Relaxants Failed - 03/20/2024 12:59 PM      Failed - This refill cannot be delegated      Failed - Valid encounter within last 6 months    Recent Outpatient Visits           1 month ago Encounter for general adult medical examination with abnormal findings   [place] [facility] [place], Rankin Buzzard, NP   5 years ago Cough   Primary [facility] at [facility], Cathye Coca, MD   5 years ago Essential hypertension   Primary Care at [facility], [place], MD   5 years ago Attention deficit hyperactivity disorder (ADHD), combined type   Primary Care at [facility], Cathye Coca, MD   5 years ago Idiopathic chronic pancreatitis [facility])   Primary Care at [facility], Edison Gore, MD               pregabalin  (LYRICA ) 150 MG capsule [Pharmacy Med Name: PREGABALIN  150MG  CAPSULES] 90 capsule     Sig: TAKE 1 CAPSULE(150 MG) BY MOUTH THREE TIMES DAILY     Not Delegated - Neurology:  Anticonvulsants - Controlled - pregabalin  Failed - 03/20/2024 12:59 PM      Failed - This refill cannot be delegated      Failed - Valid encounter within last 12 months    Recent Outpatient Visits           1 month ago Encounter for general adult medical examination with abnormal findings   [facility] [place], Rankin Buzzard, NP   5 years ago Cough   Primary  Care at [facility], Cathye Coca, MD   5 years ago Essential hypertension   Primary Care at [facility], [place] A, MD   5 years ago Attention deficit hyperactivity disorder (ADHD), combined type   Primary Care at [facility], [place] A, MD   5 years ago Idiopathic chronic pancreatitis [facility])   Primary Care at [facility], Edison Gore, MD              Passed - Cr in normal range and within 360 days    Creat  Date Value Ref Range Status  02/10/2024 0.90 0.50 - 1.05 mg/dL Final         Passed - Completed PHQ-2 or PHQ-9 in the last 360 days       traMADol  (ULTRAM ) 50 MG tablet [Pharmacy Med Name: TRAMADOL  50MG  TABLETS] 90 tablet     Sig: TAKE 1 TABLET(50 MG) BY MOUTH THREE TIMES DAILY AS NEEDED     Not Delegated - Analgesics:  Opioid Agonists Failed - 03/20/2024 12:59 PM      Failed - This refill cannot be delegated      Failed - Urine Drug Screen completed in last  360 days      Failed - Valid encounter within last 3 months    Recent Outpatient Visits           1 month ago Encounter for general adult medical examination with abnormal findings   [place] [facility] [place], Rankin Buzzard, NP   5 years ago Cough   Primary [facility] at [facility], Cathye Coca, MD   5 years ago Essential hypertension   Primary Care at [facility], Cathye Coca, MD   5 years ago Attention deficit hyperactivity disorder (ADHD), combined type   Primary Care at [facility], Cathye Coca, MD   5 years ago Idiopathic chronic pancreatitis [facility])   Primary Care at [facility], Edison Gore, MD              Refused Prescriptions Disp Refills   ondansetron  (ZOFRAN ) 8 MG tablet [Pharmacy Med Name: ONDANSETRON  8MG  TABLETS] 20 tablet 0    Sig: TAKE 1 TABLET(8 MG) BY MOUTH EVERY 4 HOURS AS NEEDED     Not Delegated - Gastroenterology: Antiemetics - ondansetron  Failed - 03/20/2024 12:59 PM      Failed - This refill cannot be delegated      Failed - Valid encounter within last 6 months    Recent Outpatient  Visits           1 month ago Encounter for general adult medical examination with abnormal findings   [place] [facility] [place], Rankin Buzzard, NP   5 years ago Cough   Primary Care at [facility], Cathye Coca, MD   5 years ago Essential hypertension   Primary Care at [facility], [place] A, MD   5 years ago Attention deficit hyperactivity disorder (ADHD), combined type   Primary Care at [facility], [place] A, MD   5 years ago Idiopathic chronic pancreatitis [facility])   Primary Care at [facility], Edison Gore, MD              Passed - AST in normal range and within 360 days    AST  Date Value Ref Range Status  02/10/2024 17 10 - 35 U/L Final         Passed - ALT in normal range and within 360 days    ALT  Date Value Ref Range Status  02/10/2024 13 6 - 29 U/L Final

## 2024-03-20 NOTE — Telephone Encounter (Signed)
 Requested by patient. Medication discontinued 02/10/24.  Requested Prescriptions  Pending Prescriptions Disp Refills   carisoprodol  (SOMA ) 350 MG tablet [Pharmacy Med Name: CARISOPRODOL  350MG  TABLETS] 90 tablet     Sig: TAKE 1 TABLET BY MOUTH FOUR TIMES DAILY AS NEEDED FOR MUSCLE SPASMS     Not Delegated - Analgesics:  Muscle Relaxants Failed - 03/20/2024 12:58 PM      Failed - This refill cannot be delegated      Failed - Valid encounter within last 6 months    Recent Outpatient Visits           1 month ago Encounter for general adult medical examination with abnormal findings   [facility] [facility] [place], Rankin Buzzard, NP   5 years ago Cough   Primary Care at [facility], Cathye Coca, MD   5 years ago Essential hypertension   Primary [facility] at [facility], Cathye Coca, MD   5 years ago Attention deficit hyperactivity disorder (ADHD), combined type   Primary Care at [facility], Cathye Coca, MD   5 years ago Idiopathic chronic pancreatitis [facility])   Primary Care at [facility], Edison Gore, MD               pregabalin  (LYRICA ) 150 MG capsule [Pharmacy Med Name: PREGABALIN  150MG  CAPSULES] 90 capsule     Sig: TAKE 1 CAPSULE(150 MG) BY MOUTH THREE TIMES DAILY     Not Delegated - Neurology:  Anticonvulsants - Controlled - pregabalin  Failed - 03/20/2024 12:58 PM      Failed - This refill cannot be delegated      Failed - Valid encounter within last 12 months    Recent Outpatient Visits           1 month ago Encounter for general adult medical examination with abnormal findings   [facility] [place], Rankin Buzzard, NP   5 years ago Cough   Primary Care at [facility], [place] A, MD   5 years ago Essential hypertension   Primary Care at [facility], [place] A, MD   5 years ago Attention deficit hyperactivity disorder (ADHD), combined type   Primary Care at [facility], [place] A, MD   5 years ago Idiopathic chronic pancreatitis [facility])   Primary Care  at [facility], Edison Gore, MD              Passed - Cr in normal range and within 360 days    Creat  Date Value Ref Range Status  02/10/2024 0.90 0.50 - 1.05 mg/dL Final         Passed - Completed PHQ-2 or PHQ-9 in the last 360 days       traMADol  (ULTRAM ) 50 MG tablet [Pharmacy Med Name: TRAMADOL  50MG  TABLETS] 90 tablet     Sig: TAKE 1 TABLET(50 MG) BY MOUTH THREE TIMES DAILY AS NEEDED     Not Delegated - Analgesics:  Opioid Agonists Failed - 03/20/2024 12:58 PM      Failed - This refill cannot be delegated      Failed - Urine Drug Screen completed in last 360 days      Failed - Valid encounter within last 3 months    Recent Outpatient Visits           1 month ago Encounter for general adult medical examination with abnormal findings   [place] [facility] [place], Rankin Buzzard, NP   5 years ago Cough  Primary Care at [facility], Cathye Coca, MD   5 years ago Essential hypertension   Primary Care at [facility], Cathye Coca, MD   5 years ago Attention deficit hyperactivity disorder (ADHD), combined type   Primary Care at [facility], Cathye Coca, MD   5 years ago Idiopathic chronic pancreatitis [facility])   Primary Care at [facility], Edison Gore, MD              Refused Prescriptions Disp Refills   ondansetron  (ZOFRAN ) 8 MG tablet [Pharmacy Med Name: ONDANSETRON  8MG  TABLETS] 20 tablet 0    Sig: TAKE 1 TABLET(8 MG) BY MOUTH EVERY 4 HOURS AS NEEDED     Not Delegated - Gastroenterology: Antiemetics - ondansetron  Failed - 03/20/2024 12:58 PM      Failed - This refill cannot be delegated      Failed - Valid encounter within last 6 months    Recent Outpatient Visits           1 month ago Encounter for general adult medical examination with abnormal findings    [facility], Rankin Buzzard, NP   5 years ago Cough   Primary Care at [facility], Cathye Coca, MD   5 years ago Essential hypertension   Primary Care at [facility], [place] A,  MD   5 years ago Attention deficit hyperactivity disorder (ADHD), combined type   Primary Care at [facility], [place] A, MD   5 years ago Idiopathic chronic pancreatitis [facility])   Primary Care at [facility], Edison Gore, MD              Passed - AST in normal range and within 360 days    AST  Date Value Ref Range Status  02/10/2024 17 10 - 35 U/L Final         Passed - ALT in normal range and within 360 days    ALT  Date Value Ref Range Status  02/10/2024 13 6 - 29 U/L Final

## 2024-03-29 ENCOUNTER — Other Ambulatory Visit: Payer: Self-pay | Admitting: Internal Medicine

## 2024-03-29 DIAGNOSIS — F902 Attention-deficit hyperactivity disorder, combined type: Secondary | ICD-10-CM

## 2024-03-29 NOTE — Telephone Encounter (Signed)
 Patient also requesting refill of ondansetron , 8mg 

## 2024-03-29 NOTE — Telephone Encounter (Signed)
 Copied from CRM [id number]. Topic: Clinical - Medication Refill >> Mar 29, 2024  4:27 PM Carla L wrote: Medication: amphetamine -dextroamphetamine  (ADDERALL) 10 MG tablet Pt has not had it filled since April 2025 due to being sick. Requesting refill.  ondansetron  (ZOFRAN ) 8 MG tablet - Pt states she is still needing refill on zofran , previous request denied 6/9. Pt requesting refill be sent for zofran .   Has the patient contacted their pharmacy? Yes Told to contact the office.  This is the patient's preferred pharmacy:  [id number] [place], [place] - [phone] AT [facility] OF [facility] & [address] [place] [address] Phone: [phone] Fax: [phone]  Is this the correct pharmacy for this prescription? Yes   Has the prescription been filled recently? No  Is the patient out of the medication? No  Has the patient been seen for an appointment in the last year OR does the patient have an upcoming appointment? Yes  Can we respond through MyChart? No  Agent: Please be advised that Rx refills may take up to 3 business days. We ask that you follow-up with your pharmacy.

## 2024-04-02 MED ORDER — AMPHETAMINE-DEXTROAMPHETAMINE 10 MG PO TABS
10.0000 mg | ORAL_TABLET | Freq: Two times a day (BID) | ORAL | 0 refills | Status: DC
Start: 2024-04-02 — End: 2024-06-21

## 2024-04-02 NOTE — Telephone Encounter (Signed)
 Requested medications are due for refill today.  yes  Requested medications are on the active medications list.  yes  Last refill. 01/10/2024 #60 0 rf  Future visit scheduled.   yes  Notes to clinic.  Refill not delegated.    Requested Prescriptions  Pending Prescriptions Disp Refills   amphetamine -dextroamphetamine  (ADDERALL) 10 MG tablet 60 tablet 0    Sig: Take 1 tablet (10 mg total) by mouth 2 (two) times daily with a meal.     Not Delegated - Psychiatry:  Stimulants/ADHD Failed - 04/02/2024  2:28 PM      Failed - This refill cannot be delegated      Failed - Urine Drug Screen completed in last 360 days      Failed - Valid encounter within last 6 months    Recent Outpatient Visits           1 month ago Encounter for general adult medical examination with abnormal findings   [place] [facility] [place], Angeline ORN, NP   5 years ago Cough   Primary Care at [facility], Santana LABOR, MD   5 years ago Essential hypertension   Primary Care at [facility], [place], MD   5 years ago Attention deficit hyperactivity disorder (ADHD), combined type   Primary Care at [facility], [place] A, MD   5 years ago Idiopathic chronic pancreatitis [facility])   Primary Care at [facility], Elyn SAILOR, MD              Passed - Last BP in normal range    BP Readings from Last 1 Encounters:  02/10/24 128/84         Passed - Last Heart Rate in normal range    Pulse Readings from Last 1 Encounters:  06/28/23 (!) 110

## 2024-04-22 ENCOUNTER — Other Ambulatory Visit: Payer: Self-pay | Admitting: Internal Medicine

## 2024-04-22 DIAGNOSIS — G894 Chronic pain syndrome: Secondary | ICD-10-CM

## 2024-04-22 DIAGNOSIS — M797 Fibromyalgia: Secondary | ICD-10-CM

## 2024-04-24 NOTE — Telephone Encounter (Signed)
 Requested medications are due for refill today.  yes  Requested medications are on the active medications list.  yes  Last refill. 03/20/2024   Future visit scheduled.   yes  Notes to clinic.  Refill not delegated.    Requested Prescriptions  Pending Prescriptions Disp Refills   pregabalin  (LYRICA ) 150 MG capsule [Pharmacy Med Name: PREGABALIN  150MG  CAPSULES] 90 capsule     Sig: TAKE 1 CAPSULE(150 MG) BY MOUTH THREE TIMES DAILY     Not Delegated - Neurology:  Anticonvulsants - Controlled - pregabalin  Failed - 04/24/2024  2:05 PM      Failed - This refill cannot be delegated      Passed - Cr in normal range and within 360 days    Creat  Date Value Ref Range Status  02/10/2024 0.90 0.50 - 1.05 mg/dL Final         Passed - Completed PHQ-2 or PHQ-9 in the last 360 days      Passed - Valid encounter within last 12 months    Recent Outpatient Visits           2 months ago Encounter for general adult medical examination with abnormal findings   [place] [facility] [place], Angeline ORN, NP   5 years ago Cough   Primary Care at [facility], Santana LABOR, MD   5 years ago Essential hypertension   Primary Care at [facility], [place], MD   5 years ago Attention deficit hyperactivity disorder (ADHD), combined type   Primary Care at [facility], Santana LABOR, MD   5 years ago Idiopathic chronic pancreatitis [facility])   Primary Care at [facility], Elyn SAILOR, MD               carisoprodol  (SOMA ) 350 MG tablet [Pharmacy Med Name: CARISOPRODOL  350MG  TABLETS] 90 tablet     Sig: TAKE 1 TABLET BY MOUTH FOUR TIMES DAILY AS NEEDED FOR MUSCLE SPASMS     Not Delegated - Analgesics:  Muscle Relaxants Failed - 04/24/2024  2:05 PM      Failed - This refill cannot be delegated      Passed - Valid encounter within last 6 months    Recent Outpatient Visits           2 months ago Encounter for general adult medical examination with abnormal findings   [place] [facility] [place], Angeline ORN, NP   5 years ago Cough   Primary Care at [facility], Santana LABOR, MD   5 years ago Essential hypertension   Primary Care at [facility], Santana LABOR, MD   5 years ago Attention deficit hyperactivity disorder (ADHD), combined type   Primary Care at [facility], Santana LABOR, MD   5 years ago Idiopathic chronic pancreatitis [facility])   Primary Care at [facility], Elyn SAILOR, MD               traMADol  (ULTRAM ) 50 MG tablet [Pharmacy Med Name: TRAMADOL  50MG  TABLETS] 90 tablet     Sig: TAKE 1 TABLET(50 MG) BY MOUTH THREE TIMES DAILY AS NEEDED     Not Delegated - Analgesics:  Opioid Agonists Failed - 04/24/2024  2:05 PM      Failed - This refill cannot be delegated      Failed - Urine Drug Screen completed in last 360 days      Passed - Valid encounter within last 3 months    Recent Outpatient Visits  2 months ago Encounter for general adult medical examination with abnormal findings   [place] [facility] [place], Angeline ORN, NP   5 years ago Cough   Primary Care at [facility], Santana LABOR, MD   5 years ago Essential hypertension   Primary Care at [facility], Santana LABOR, MD   5 years ago Attention deficit hyperactivity disorder (ADHD), combined type   Primary [facility] at [facility], Santana LABOR, MD   5 years ago Idiopathic chronic pancreatitis [facility])   Primary Care at [facility], Elyn SAILOR, MD

## 2024-05-23 ENCOUNTER — Other Ambulatory Visit: Payer: Self-pay | Admitting: Internal Medicine

## 2024-05-25 NOTE — Telephone Encounter (Signed)
 Requested Prescriptions  Pending Prescriptions Disp Refills   montelukast  (SINGULAIR ) 10 MG tablet [Pharmacy Med Name: MONTELUKAST  10MG  TABLETS] 90 tablet 0    Sig: TAKE 1 TABLET(10 MG) BY MOUTH EVERY MORNING     Pulmonology:  Leukotriene Inhibitors Passed - 05/25/2024  1:33 PM      Passed - Valid encounter within last 12 months    Recent Outpatient Visits           3 months ago Encounter for general adult medical examination with abnormal findings    [facility], Angeline ORN, NP   5 years ago Cough   Primary Care at [facility], Santana LABOR, MD   5 years ago Essential hypertension   Primary Care at [facility], [place], MD   5 years ago Attention deficit hyperactivity disorder (ADHD), combined type   Primary Care at [facility], Santana LABOR, MD   5 years ago Idiopathic chronic pancreatitis [facility])   Primary Care at [facility], Elyn SAILOR, MD

## 2024-05-29 ENCOUNTER — Other Ambulatory Visit: Payer: Self-pay | Admitting: Internal Medicine

## 2024-05-29 DIAGNOSIS — G894 Chronic pain syndrome: Secondary | ICD-10-CM

## 2024-05-29 DIAGNOSIS — M797 Fibromyalgia: Secondary | ICD-10-CM

## 2024-05-30 NOTE — Telephone Encounter (Signed)
 Requested Prescriptions  Pending Prescriptions Disp Refills   budesonide -formoterol  (SYMBICORT ) 160-4.5 MCG/ACT inhaler [Pharmacy Med Name: BUDESONIDE /FORM 160/4.5MCG(120 INH)] 10.2 g 2    Sig: INHALE 2 PUFFS BY MOUTH TWICE DAILY     Pulmonology:  Combination Products Passed - 05/30/2024  3:50 PM      Passed - Valid encounter within last 12 months    Recent Outpatient Visits           3 months ago Encounter for general adult medical examination with abnormal findings   [place] [facility] [place], Angeline ORN, NP   5 years ago Cough   Primary Care at [facility], Santana LABOR, MD   5 years ago Essential hypertension   Primary Care at [facility], [place], MD   5 years ago Attention deficit hyperactivity disorder (ADHD), combined type   Primary Care at [facility], Santana LABOR, MD   5 years ago Idiopathic chronic pancreatitis [facility])   Primary Care at [facility], Elyn SAILOR, MD               carisoprodol  (SOMA ) 350 MG tablet [Pharmacy Med Name: CARISOPRODOL  350MG  TABLETS] 90 tablet     Sig: TAKE 1 TABLET BY MOUTH FOUR TIMES DAILY AS NEEDED FOR MUSCLE SPASMS     Not Delegated - Analgesics:  Muscle Relaxants Failed - 05/30/2024  3:50 PM      Failed - This refill cannot be delegated      Passed - Valid encounter within last 6 months    Recent Outpatient Visits           3 months ago Encounter for general adult medical examination with abnormal findings   [place] [facility] [place], Angeline ORN, NP   5 years ago Cough   Primary Care at [facility], Santana LABOR, MD   5 years ago Essential hypertension   Primary Care at [facility], [place] A, MD   5 years ago Attention deficit hyperactivity disorder (ADHD), combined type   Primary Care at [facility], [place] A, MD   5 years ago Idiopathic chronic pancreatitis [facility])   Primary Care at [facility], Elyn SAILOR, MD               pregabalin  (LYRICA ) 150 MG capsule [Pharmacy Med Name: PREGABALIN  150MG   CAPSULES] 90 capsule     Sig: TAKE 1 CAPSULE(150 MG) BY MOUTH THREE TIMES DAILY     Not Delegated - Neurology:  Anticonvulsants - Controlled - pregabalin  Failed - 05/30/2024  3:50 PM      Failed - This refill cannot be delegated      Passed - Cr in normal range and within 360 days    Creat  Date Value Ref Range Status  02/10/2024 0.90 0.50 - 1.05 mg/dL Final         Passed - Completed PHQ-2 or PHQ-9 in the last 360 days      Passed - Valid encounter within last 12 months    Recent Outpatient Visits           3 months ago Encounter for general adult medical examination with abnormal findings   [place] [facility] [place], Angeline ORN, NP   5 years ago Cough   Primary Care at [facility], Santana LABOR, MD   5 years ago Essential hypertension   [facility] at [facility], [place] A, MD   5 years ago Attention deficit hyperactivity disorder (ADHD), combined type  Primary Care at [facility], Santana LABOR, MD   5 years ago Idiopathic chronic pancreatitis [facility])   Primary Care at [facility], Elyn SAILOR, MD               traMADol  (ULTRAM ) 50 MG tablet [Pharmacy Med Name: TRAMADOL  50MG  TABLETS] 90 tablet     Sig: TAKE 1 TABLET(50 MG) BY MOUTH THREE TIMES DAILY AS NEEDED     Not Delegated - Analgesics:  Opioid Agonists Failed - 05/30/2024  3:50 PM      Failed - This refill cannot be delegated      Failed - Urine Drug Screen completed in last 360 days      Failed - Valid encounter within last 3 months    Recent Outpatient Visits           3 months ago Encounter for general adult medical examination with abnormal findings   [place] [facility] [place], Angeline ORN, NP   5 years ago Cough   Primary Care at [facility], Santana LABOR, MD   5 years ago Essential hypertension   Primary Care at [facility], Santana LABOR, MD   5 years ago Attention deficit hyperactivity disorder (ADHD), combined type   Primary Care at [facility], Santana LABOR, MD   5 years ago  Idiopathic chronic pancreatitis [facility])   Primary Care at [facility], Elyn SAILOR, MD

## 2024-05-30 NOTE — Telephone Encounter (Signed)
 Requested medication (s) are due for refill today - yes  Requested medication (s) are on the active medication list -yes  Future visit scheduled -yes  Last refill: 04/24/24 #90- for all requested prescriptions   Notes to clinic: non delegated Rx  Requested Prescriptions  Pending Prescriptions Disp Refills   carisoprodol  (SOMA ) 350 MG tablet [Pharmacy Med Name: CARISOPRODOL  350MG  TABLETS] 90 tablet     Sig: TAKE 1 TABLET BY MOUTH FOUR TIMES DAILY AS NEEDED FOR MUSCLE SPASMS     Not Delegated - Analgesics:  Muscle Relaxants Failed - 05/30/2024  3:50 PM      Failed - This refill cannot be delegated      Passed - Valid encounter within last 6 months    Recent Outpatient Visits           3 months ago Encounter for general adult medical examination with abnormal findings   [place] [facility] [place], Angeline ORN, NP   5 years ago Cough   [facility] at [facility], Santana LABOR, MD   5 years ago Essential hypertension   Primary Care at [facility], [place] A, MD   5 years ago Attention deficit hyperactivity disorder (ADHD), combined type   Primary Care at [facility], [place] A, MD   5 years ago Idiopathic chronic pancreatitis [facility])   Primary Care at [facility], Elyn SAILOR, MD               pregabalin  (LYRICA ) 150 MG capsule [Pharmacy Med Name: PREGABALIN  150MG  CAPSULES] 90 capsule     Sig: TAKE 1 CAPSULE(150 MG) BY MOUTH THREE TIMES DAILY     Not Delegated - Neurology:  Anticonvulsants - Controlled - pregabalin  Failed - 05/30/2024  3:50 PM      Failed - This refill cannot be delegated      Passed - Cr in normal range and within 360 days    Creat  Date Value Ref Range Status  02/10/2024 0.90 0.50 - 1.05 mg/dL Final         Passed - Completed PHQ-2 or PHQ-9 in the last 360 days      Passed - Valid encounter within last 12 months    Recent Outpatient Visits           3 months ago Encounter for general adult medical examination with abnormal findings    [place] [facility] [place], Angeline ORN, NP   5 years ago Cough   Primary Care at [facility], Santana LABOR, MD   5 years ago Essential hypertension   Primary Care at [facility], Santana LABOR, MD   5 years ago Attention deficit hyperactivity disorder (ADHD), combined type   Primary Care at [facility], [place] A, MD   5 years ago Idiopathic chronic pancreatitis [facility])   Primary Care at [facility], Elyn SAILOR, MD               traMADol  (ULTRAM ) 50 MG tablet [Pharmacy Med Name: TRAMADOL  50MG  TABLETS] 90 tablet     Sig: TAKE 1 TABLET(50 MG) BY MOUTH THREE TIMES DAILY AS NEEDED     Not Delegated - Analgesics:  Opioid Agonists Failed - 05/30/2024  3:50 PM      Failed - This refill cannot be delegated      Failed - Urine Drug Screen completed in last 360 days      Failed - Valid encounter within last 3 months    Recent Outpatient Visits  3 months ago Encounter for general adult medical examination with abnormal findings   [place] [facility], Angeline ORN, NP   5 years ago Cough   Primary Care at [facility], Santana LABOR, MD   5 years ago Essential hypertension   Primary Care at [facility], Santana LABOR, MD   5 years ago Attention deficit hyperactivity disorder (ADHD), combined type   Primary Care at [facility], Santana LABOR, MD   5 years ago Idiopathic chronic pancreatitis [facility])   Primary Care at [facility], Elyn SAILOR, MD              Signed Prescriptions Disp Refills   budesonide -formoterol  (SYMBICORT ) 160-4.5 MCG/ACT inhaler 10.2 g 2    Sig: INHALE 2 PUFFS BY MOUTH TWICE DAILY     Pulmonology:  Combination Products Passed - 05/30/2024  3:50 PM      Passed - Valid encounter within last 12 months    Recent Outpatient Visits           3 months ago Encounter for general adult medical examination with abnormal findings   [place] [facility] [place], Angeline ORN, NP   5 years ago Cough   Primary Care at [facility],  Santana LABOR, MD   5 years ago Essential hypertension   [facility], Santana LABOR, MD   5 years ago Attention deficit hyperactivity disorder (ADHD), combined type   Primary Care at [facility], Santana LABOR, MD   5 years ago Idiopathic chronic pancreatitis [facility])   Primary Care at [facility], Elyn SAILOR, MD                 Requested Prescriptions  Pending Prescriptions Disp Refills   carisoprodol  (SOMA ) 350 MG tablet [Pharmacy Med Name: CARISOPRODOL  350MG  TABLETS] 90 tablet     Sig: TAKE 1 TABLET BY MOUTH FOUR TIMES DAILY AS NEEDED FOR MUSCLE SPASMS     Not Delegated - Analgesics:  Muscle Relaxants Failed - 05/30/2024  3:50 PM      Failed - This refill cannot be delegated      Passed - Valid encounter within last 6 months    Recent Outpatient Visits           3 months ago Encounter for general adult medical examination with abnormal findings   [place] [facility] [place], Angeline ORN, NP   5 years ago Cough   Primary [facility] at [facility], Santana LABOR, MD   5 years ago Essential hypertension   Primary [facility] at [facility], Santana LABOR, MD   5 years ago Attention deficit hyperactivity disorder (ADHD), combined type   [facility] at [facility], [place] A, MD   5 years ago Idiopathic chronic pancreatitis [facility])   Primary Care at [facility], Elyn SAILOR, MD               pregabalin  (LYRICA ) 150 MG capsule [Pharmacy Med Name: PREGABALIN  150MG  CAPSULES] 90 capsule     Sig: TAKE 1 CAPSULE(150 MG) BY MOUTH THREE TIMES DAILY     Not Delegated - Neurology:  Anticonvulsants - Controlled - pregabalin  Failed - 05/30/2024  3:50 PM      Failed - This refill cannot be delegated      Passed - Cr in normal range and within 360 days    Creat  Date Value Ref Range Status  02/10/2024 0.90 0.50 - 1.05 mg/dL Final  Passed - Completed PHQ-2 or PHQ-9 in the last 360 days      Passed - Valid encounter within last 12 months    Recent Outpatient Visits           3  months ago Encounter for general adult medical examination with abnormal findings   [facility] [place], Angeline ORN, NP   5 years ago Cough   Primary [facility] at [facility], Santana LABOR, MD   5 years ago Essential hypertension   [facility] at [facility], Santana LABOR, MD   5 years ago Attention deficit hyperactivity disorder (ADHD), combined type   Primary [facility] at [facility], Santana LABOR, MD   5 years ago Idiopathic chronic pancreatitis [facility])   Primary Care at [facility], Elyn SAILOR, MD               traMADol  (ULTRAM ) 50 MG tablet [Pharmacy Med Name: TRAMADOL  50MG  TABLETS] 90 tablet     Sig: TAKE 1 TABLET(50 MG) BY MOUTH THREE TIMES DAILY AS NEEDED     Not Delegated - Analgesics:  Opioid Agonists Failed - 05/30/2024  3:50 PM      Failed - This refill cannot be delegated      Failed - Urine Drug Screen completed in last 360 days      Failed - Valid encounter within last 3 months    Recent Outpatient Visits           3 months ago Encounter for general adult medical examination with abnormal findings   [place] [facility] [place], Angeline ORN, NP   5 years ago Cough   [facility] at [facility], Santana LABOR, MD   5 years ago Essential hypertension   Primary Care at [facility], [place], MD   5 years ago Attention deficit hyperactivity disorder (ADHD), combined type   Primary [facility] at [facility], [place] A, MD   5 years ago Idiopathic chronic pancreatitis [facility])   Primary Care at [facility], Elyn SAILOR, MD              Signed Prescriptions Disp Refills   budesonide -formoterol  (SYMBICORT ) 160-4.5 MCG/ACT inhaler 10.2 g 2    Sig: INHALE 2 PUFFS BY MOUTH TWICE DAILY     Pulmonology:  Combination Products Passed - 05/30/2024  3:50 PM      Passed - Valid encounter within last 12 months    Recent Outpatient Visits           3 months ago Encounter for general adult medical examination with abnormal findings   [place] [facility] [place], Angeline ORN, NP   5 years ago Cough   Primary Care at [facility], Santana LABOR, MD   5 years ago Essential hypertension   Primary Care at [facility], Santana LABOR, MD   5 years ago Attention deficit hyperactivity disorder (ADHD), combined type   Primary Care at [facility], Santana LABOR, MD   5 years ago Idiopathic chronic pancreatitis [facility])   Primary Care at [facility], Elyn SAILOR, MD

## 2024-06-01 NOTE — Telephone Encounter (Unsigned)
 Copied from CRM [id number]. Topic: Clinical - Medication Refill >> Jun 01, 2024 11:48 AM Dedra B wrote: Medication: amphetamine -dextroamphetamine  (ADDERALL) 10 MG tablet aspirin  EC 81 MG tablet  Has the patient contacted their pharmacy? No (Agent: If no, request that the patient contact the pharmacy for the refill. If patient does not wish to contact the pharmacy document the reason why and proceed with request.)  Normally contacts office for refill  This is the patient's preferred pharmacy:  [id number] [place], [place] - [address] AT [facility] OF [facility] & [address] [place] [address] Phone: [phone] Fax: [phone]  Is this the correct pharmacy for this prescription? Yes  Has the prescription been filled recently? No  Is the patient out of the medication? Yes  Has the patient been seen for an appointment in the last year OR does the patient have an upcoming appointment? Yes  Can we respond through MyChart? Yes  Agent: Please be advised that Rx refills may take up to 3 business days. We ask that you follow-up with your pharmacy.

## 2024-06-21 ENCOUNTER — Other Ambulatory Visit: Payer: Self-pay | Admitting: Internal Medicine

## 2024-06-21 DIAGNOSIS — F902 Attention-deficit hyperactivity disorder, combined type: Secondary | ICD-10-CM

## 2024-06-21 NOTE — Telephone Encounter (Unsigned)
 Copied from CRM #[id number]. Topic: Clinical - Medication Refill >> Jun 21, 2024  3:49 PM Gloria Carter wrote: Medication: amphetamine -dextroamphetamine  (ADDERALL) 10 MG tablet (Originally requested on 06/01/24 but never processed)  Has the patient contacted their pharmacy? Yes, call Dr  This is the patient's preferred pharmacy:  [id number] [place], [place] - [phone] AT [facility] OF [facility] & [address] [place] [address] Phone: [phone] Fax: [phone]  Is this the correct pharmacy for this prescription? Yes If no, delete pharmacy and type the correct one.   Has the prescription been filled recently? No  Is the patient out of the medication? Yes  Has the patient been seen for an appointment in the last year OR does the patient have an upcoming appointment? Yes  Can we respond through MyChart? Yes  Agent: Please be advised that Rx refills may take up to 3 business days. We ask that you follow-up with your pharmacy.

## 2024-06-21 NOTE — Telephone Encounter (Unsigned)
 Copied from CRM #[id number]. Topic: Clinical - Medication Refill >> Jun 21, 2024  4:07 PM Antwanette L wrote: Medication: budesonide -formoterol  (SYMBICORT ) 160-4.5 MCG/ACT inhaler   Has the patient contacted their pharmacy? Yes  This is the patient's preferred pharmacy:  [id number] [place], [place] - [address] AT [facility] OF [facility] & [address] [place] [address] Phone: [phone] Fax: [phone]   Is this the correct pharmacy for this prescription? Yes    Has the prescription been filled recently? Yes. Order was sent 05/30/24 but it was never filled. Pt has 2 refills left  Is the patient out of the medication? Yes  Has the patient been seen for an appointment in the last year OR does the patient have an upcoming appointment? Yes. Last ov w/ regina baity was on 02/10/24  Can we respond through MyChart? No. Contact the pt by phone at [phone]  Agent: Please be advised that Rx refills may take up to 3 business days. We ask that you follow-up with your pharmacy.

## 2024-06-22 MED ORDER — AMPHETAMINE-DEXTROAMPHETAMINE 10 MG PO TABS
10.0000 mg | ORAL_TABLET | Freq: Two times a day (BID) | ORAL | 0 refills | Status: DC
Start: 2024-06-22 — End: 2024-08-31

## 2024-06-22 NOTE — Telephone Encounter (Signed)
 Requested medication (s) are due for refill today:   Provider to review  Requested medication (s) are on the active medication list:   Yes  Future visit scheduled:   Yes 11/7/   Last ordered: 04/02/2024 #60, 0 refill  Non delegated refill    Requested Prescriptions  Pending Prescriptions Disp Refills   amphetamine -dextroamphetamine  (ADDERALL) 10 MG tablet 60 tablet 0    Sig: Take 1 tablet (10 mg total) by mouth 2 (two) times daily with a meal.     Not Delegated - Psychiatry:  Stimulants/ADHD Failed - 06/22/2024 12:06 PM      Failed - This refill cannot be delegated      Failed - Urine Drug Screen completed in last 360 days      Passed - Last BP in normal range    BP Readings from Last 1 Encounters:  02/10/24 128/84         Passed - Last Heart Rate in normal range    Pulse Readings from Last 1 Encounters:  06/28/23 (!) 110         Passed - Valid encounter within last 6 months    Recent Outpatient Visits           4 months ago Encounter for general adult medical examination with abnormal findings   [place] [facility] [place], Angeline ORN, NP   5 years ago Cough   Primary Care at [facility], Santana LABOR, MD   5 years ago Essential hypertension   Primary Care at [facility], Santana LABOR, MD   5 years ago Attention deficit hyperactivity disorder (ADHD), combined type   Primary [facility] at [facility], Santana LABOR, MD   5 years ago Idiopathic chronic pancreatitis [facility])   Primary Care at [facility], Elyn SAILOR, MD

## 2024-06-22 NOTE — Telephone Encounter (Signed)
 Too soon for refill, LRF 05/30/24.  Requested Prescriptions  Pending Prescriptions Disp Refills   budesonide -formoterol  (SYMBICORT ) 160-4.5 MCG/ACT inhaler 10.2 g 2    Sig: Inhale 2 puffs into the lungs 2 (two) times daily.     Pulmonology:  Combination Products Passed - 06/22/2024 12:52 PM      Passed - Valid encounter within last 12 months    Recent Outpatient Visits           4 months ago Encounter for general adult medical examination with abnormal findings   [place] [facility] [place], Angeline ORN, NP   5 years ago Cough   Primary Care at [facility], Santana LABOR, MD   5 years ago Essential hypertension   Primary Care at [facility], Santana LABOR, MD   5 years ago Attention deficit hyperactivity disorder (ADHD), combined type   Primary Care at [facility], Santana LABOR, MD   5 years ago Idiopathic chronic pancreatitis [facility])   Primary Care at [facility], Elyn SAILOR, MD

## 2024-07-06 ENCOUNTER — Other Ambulatory Visit: Payer: Self-pay | Admitting: Internal Medicine

## 2024-07-06 DIAGNOSIS — G894 Chronic pain syndrome: Secondary | ICD-10-CM

## 2024-07-06 DIAGNOSIS — M797 Fibromyalgia: Secondary | ICD-10-CM

## 2024-07-06 NOTE — Telephone Encounter (Signed)
 Requested medications are due for refill today.  yes  Requested medications are on the active medications list.  yes  Last refill. 05/31/2024  #90 0 rf  Future visit scheduled.   yes  Notes to clinic.  Refills on these medications not delegated.    Requested Prescriptions  Pending Prescriptions Disp Refills   carisoprodol  (SOMA ) 350 MG tablet [Pharmacy Med Name: CARISOPRODOL  350MG  TABLETS] 90 tablet     Sig: TAKE 1 TABLET BY MOUTH FOUR TIMES DAILY AS NEEDED FOR MUSCLE SPASMS     Not Delegated - Analgesics:  Muscle Relaxants Failed - 07/06/2024  4:53 PM      Failed - This refill cannot be delegated      Passed - Valid encounter within last 6 months    Recent Outpatient Visits           4 months ago Encounter for general adult medical examination with abnormal findings   [facility] [place], Angeline ORN, NP   5 years ago Cough   Primary Care at [facility], Santana LABOR, MD   5 years ago Essential hypertension   Primary Care at [facility], [place], MD   5 years ago Attention deficit hyperactivity disorder (ADHD), combined type   Primary Care at [facility], [place] A, MD   5 years ago Idiopathic chronic pancreatitis [facility])   Primary Care at [facility], Elyn SAILOR, MD               pregabalin  (LYRICA ) 150 MG capsule [Pharmacy Med Name: PREGABALIN  150MG  CAPSULES] 90 capsule     Sig: TAKE 1 CAPSULE(150 MG) BY MOUTH THREE TIMES DAILY     Not Delegated - Neurology:  Anticonvulsants - Controlled - pregabalin  Failed - 07/06/2024  4:53 PM      Failed - This refill cannot be delegated      Passed - Cr in normal range and within 360 days    Creat  Date Value Ref Range Status  02/10/2024 0.90 0.50 - 1.05 mg/dL Final         Passed - Completed PHQ-2 or PHQ-9 in the last 360 days      Passed - Valid encounter within last 12 months    Recent Outpatient Visits           4 months ago Encounter for general adult medical examination with abnormal findings    [place] [facility], Angeline ORN, NP   5 years ago Cough   Primary Care at [facility], Santana LABOR, MD   5 years ago Essential hypertension   Primary Care at [facility], [place], MD   5 years ago Attention deficit hyperactivity disorder (ADHD), combined type   Primary Care at [facility], Santana LABOR, MD   5 years ago Idiopathic chronic pancreatitis [facility])   Primary Care at [facility], Elyn SAILOR, MD               traMADol  (ULTRAM ) 50 MG tablet [Pharmacy Med Name: TRAMADOL  50MG  TABLETS] 90 tablet     Sig: TAKE 1 TABLET(50 MG) BY MOUTH THREE TIMES DAILY AS NEEDED     Not Delegated - Analgesics:  Opioid Agonists Failed - 07/06/2024  4:53 PM      Failed - This refill cannot be delegated      Failed - Urine Drug Screen completed in last 360 days      Failed - Valid encounter within last 3 months  Recent Outpatient Visits           4 months ago Encounter for general adult medical examination with abnormal findings   [place] [facility] [place], Angeline ORN, NP   5 years ago Cough   Primary Care at [facility], Santana LABOR, MD   5 years ago Essential hypertension   Primary Care at [facility], Santana LABOR, MD   5 years ago Attention deficit hyperactivity disorder (ADHD), combined type   Primary Care at [facility], Santana LABOR, MD   5 years ago Idiopathic chronic pancreatitis [facility])   Primary Care at [facility], Elyn SAILOR, MD

## 2024-07-12 ENCOUNTER — Ambulatory Visit: Payer: Self-pay

## 2024-07-12 NOTE — Telephone Encounter (Signed)
 FYI Only or Action Required?: Action required by provider: clinical question for provider.  Patient was last seen in primary care on 02/10/2024 by Antonette Angeline ORN, NP.  Called Nurse Triage reporting Advice Only.  Symptoms began today.   Triage Disposition: Information or Advice Only Call  Patient/caregiver understands and will follow disposition?: Yes   Copied from CRM [id number]. Topic: Clinical - Red Word Triage >> Jul 12, 2024  4:48 PM Delon HERO wrote: Red Word that prompted transfer to Nurse Triage: Patient is calling to follow up on request for budesonide -formoterol  (SYMBICORT ) 160-4.5 MCG/ACT inhaler [[id number]]. Patient reporting that she is feeling short of breathe. Patient advised medication was not sent 05/30/2024 Reason for Disposition  Health information question, no triage required and triager able to answer question    Routing to PCP office  Answer Assessment - Initial Assessment Questions Pt stated her pharmacy stated she does not have any refills on symbicort : her pharmacy stated the last rx on 05/30/2024 was cancelled.  Pt would like to request new rx for symbicort  be sent to her pharmacy.  Protocols used: Information Only Call - No Triage-A-AH

## 2024-07-13 ENCOUNTER — Other Ambulatory Visit: Payer: Self-pay

## 2024-07-13 ENCOUNTER — Other Ambulatory Visit: Payer: Self-pay | Admitting: Internal Medicine

## 2024-07-13 MED ORDER — BUDESONIDE-FORMOTEROL FUMARATE 160-4.5 MCG/ACT IN AERO: 2.0000 | INHALATION_SPRAY | Freq: Two times a day (BID) | RESPIRATORY_TRACT | 2 refills | Status: AC

## 2024-07-13 MED ORDER — BUDESONIDE-FORMOTEROL FUMARATE 160-4.5 MCG/ACT IN AERO: 2.0000 | INHALATION_SPRAY | Freq: Two times a day (BID) | RESPIRATORY_TRACT | 2 refills | Status: DC

## 2024-07-13 MED ORDER — BUDESONIDE-FORMOTEROL FUMARATE 160-4.5 MCG/ACT IN AERO: 2.0000 | INHALATION_SPRAY | Freq: Two times a day (BID) | RESPIRATORY_TRACT | 3 refills | Status: DC

## 2024-07-13 NOTE — Addendum Note (Signed)
 Addended by: ZELIA GAUZE D on: 07/13/2024 08:08 AM   Modules accepted: Orders

## 2024-07-13 NOTE — Addendum Note (Signed)
 Addended by: ZELIA GAUZE D on: 07/13/2024 07:57 AM   Modules accepted: Orders

## 2024-07-13 NOTE — Telephone Encounter (Signed)
 Symbicort sent to pharmacy

## 2024-08-10 ENCOUNTER — Other Ambulatory Visit: Payer: Self-pay | Admitting: Internal Medicine

## 2024-08-10 DIAGNOSIS — M797 Fibromyalgia: Secondary | ICD-10-CM

## 2024-08-10 DIAGNOSIS — I10 Essential (primary) hypertension: Secondary | ICD-10-CM

## 2024-08-10 DIAGNOSIS — G894 Chronic pain syndrome: Secondary | ICD-10-CM

## 2024-08-11 NOTE — Telephone Encounter (Signed)
 Requested medication (s) are due for refill today: yes  Requested medication (s) are on the active medication list: yes  Last refill:  07/09/24  Future visit scheduled: yes  Notes to clinic:  Unable to refill per protocol, cannot delegate.      Requested Prescriptions  Pending Prescriptions Disp Refills   pregabalin  (LYRICA ) 150 MG capsule [Pharmacy Med Name: PREGABALIN  150MG  CAPSULES] 90 capsule     Sig: TAKE 1 CAPSULE(150 MG) BY MOUTH THREE TIMES DAILY     Not Delegated - Neurology:  Anticonvulsants - Controlled - pregabalin  Failed - 08/11/2024 10:07 AM      Failed - This refill cannot be delegated      Passed - Cr in normal range and within 360 days    Creat  Date Value Ref Range Status  02/10/2024 0.90 0.50 - 1.05 mg/dL Final         Passed - Completed PHQ-2 or PHQ-9 in the last 360 days      Passed - Valid encounter within last 12 months    Recent Outpatient Visits           6 months ago Encounter for general adult medical examination with abnormal findings   [facility] [place], Angeline ORN, NP   5 years ago Cough   Primary Care at [facility], Santana LABOR, MD   5 years ago Essential hypertension   Primary Care at [facility], Santana LABOR, MD   5 years ago Attention deficit hyperactivity disorder (ADHD), combined type   Primary Care at [facility] Santana LABOR, MD   5 years ago Idiopathic chronic pancreatitis [facility])   Primary Care at [facility], Elyn SAILOR, MD               traMADol  (ULTRAM ) 50 MG tablet [Pharmacy Med Name: TRAMADOL  50MG  TABLETS] 90 tablet     Sig: TAKE 1 TABLET(50 MG) BY MOUTH THREE TIMES DAILY AS NEEDED     Not Delegated - Analgesics:  Opioid Agonists Failed - 08/11/2024 10:07 AM      Failed - This refill cannot be delegated      Failed - Urine Drug Screen completed in last 360 days      Failed - Valid encounter within last 3 months    Recent Outpatient Visits           6 months ago Encounter for general adult  medical examination with abnormal findings   [facility] [place], Angeline ORN, NP   5 years ago Cough   Primary Care at [facility], Santana LABOR, MD   5 years ago Essential hypertension   Primary Care at [facility] [place], [place], MD   5 years ago Attention deficit hyperactivity disorder (ADHD), combined type   Primary Care at [facility], Santana LABOR, MD   5 years ago Idiopathic chronic pancreatitis [facility])   Primary Care at [facility], Elyn SAILOR, MD               carisoprodol  (SOMA ) 350 MG tablet [Pharmacy Med Name: CARISOPRODOL  350MG  TABLETS] 90 tablet     Sig: TAKE 1 TABLET BY MOUTH FOUR TIMES DAILY AS NEEDED FOR MUSCLE SPASMS     Not Delegated - Analgesics:  Muscle Relaxants Failed - 08/11/2024 10:07 AM      Failed - This refill cannot be delegated      Failed - Valid encounter within last 6 months    Recent Outpatient Visits  6 months ago Encounter for general adult medical examination with abnormal findings   [facility] [place], Angeline ORN, NP   5 years ago Cough   Primary Care at [facility], Santana LABOR, MD   5 years ago Essential hypertension   Primary Care at [facility], [place], MD   5 years ago Attention deficit hyperactivity disorder (ADHD), combined type   Primary Care at [facility], Santana LABOR, MD   5 years ago Idiopathic chronic pancreatitis [facility])   Primary Care at [facility], Elyn SAILOR, MD               telmisartan -hydrochlorothiazide  (MICARDIS  HCT) 80-25 MG tablet [Pharmacy Med Name: TELMISARTAN /HCTZ 80-25MG  TABS] 90 tablet 1    Sig: TAKE 1 TABLET BY MOUTH DAILY     Cardiovascular: ARB + Diuretic Combos Failed - 08/11/2024 10:07 AM      Failed - K in normal range and within 180 days    Potassium  Date Value Ref Range Status  02/10/2024 3.8 3.5 - 5.3 mmol/L Final         Failed - Na in normal range and within 180 days    Sodium  Date Value Ref Range Status  02/10/2024 128 (L) 135 - 146 mmol/L  Final  01/05/2019 142 134 - 144 mmol/L Final         Failed - Cr in normal range and within 180 days    Creat  Date Value Ref Range Status  02/10/2024 0.90 0.50 - 1.05 mg/dL Final         Failed - eGFR is 10 or above and within 180 days    GFR, Est African American  Date Value Ref Range Status  08/23/2014 >89 mL/min Final   GFR calc Af Amer  Date Value Ref Range Status  03/28/2020 >60 >60 mL/min Final   GFR, Est Non African American  Date Value Ref Range Status  08/23/2014 >89 mL/min Final    Comment:      The estimated GFR is a calculation valid for adults (>=76 years old) that uses the CKD-EPI algorithm to adjust for age and sex. It is   not to be used for children, pregnant women, hospitalized patients,    patients on dialysis, or with rapidly changing kidney function. According to the NKDEP, eGFR >89 is normal, 60-89 shows mild impairment, 30-59 shows moderate impairment, 15-29 shows severe impairment and <15 is ESRD.      GFR calc non Af Amer  Date Value Ref Range Status  03/28/2020 >60 >60 mL/min Final   GFR  Date Value Ref Range Status  01/05/2022 85.26 >60.00 mL/min Final    Comment:    Calculated using the CKD-EPI Creatinine Equation (2021)   eGFR  Date Value Ref Range Status  02/10/2024 73 > OR = 60 mL/min/1.68m2 Final         Failed - Valid encounter within last 6 months    Recent Outpatient Visits           6 months ago Encounter for general adult medical examination with abnormal findings   [facility] [place], Angeline ORN, NP   5 years ago Cough   Primary Care at [facility], Santana LABOR, MD   5 years ago Essential hypertension   Primary Care at [facility], [place], MD   5 years ago Attention deficit hyperactivity disorder (ADHD), combined type   Primary Care at [facility], Zoe A,  MD   5 years ago Idiopathic chronic pancreatitis [facility])   Primary Care at [facility], Elyn SAILOR, MD              Passed -  Patient is not pregnant      Passed - Last BP in normal range    BP Readings from Last 1 Encounters:  02/10/24 128/84

## 2024-08-17 ENCOUNTER — Other Ambulatory Visit: Payer: Self-pay

## 2024-08-17 ENCOUNTER — Emergency Department (HOSPITAL_BASED_OUTPATIENT_CLINIC_OR_DEPARTMENT_OTHER)
Admission: EM | Admit: 2024-08-17 | Discharge: 2024-08-17 | Disposition: A | Discharge status: Home / Self Care | Attending: Emergency Medicine | Admitting: Emergency Medicine

## 2024-08-17 ENCOUNTER — Telehealth: Payer: Self-pay

## 2024-08-17 ENCOUNTER — Ambulatory Visit: Payer: Self-pay

## 2024-08-17 ENCOUNTER — Encounter (HOSPITAL_BASED_OUTPATIENT_CLINIC_OR_DEPARTMENT_OTHER): Payer: Self-pay

## 2024-08-17 ENCOUNTER — Ambulatory Visit: Admitting: Internal Medicine

## 2024-08-17 DIAGNOSIS — K861 Other chronic pancreatitis: Secondary | ICD-10-CM | POA: Insufficient documentation

## 2024-08-17 DIAGNOSIS — R110 Nausea: Secondary | ICD-10-CM

## 2024-08-17 DIAGNOSIS — R1012 Left upper quadrant pain: Secondary | ICD-10-CM | POA: Diagnosis present

## 2024-08-17 LAB — URINALYSIS, ROUTINE W REFLEX MICROSCOPIC
Bacteria, UA: NONE SEEN
Bilirubin Urine: NEGATIVE
Glucose, UA: NEGATIVE mg/dL
Ketones, ur: NEGATIVE mg/dL
Leukocytes,Ua: NEGATIVE
Nitrite: NEGATIVE
Protein, ur: NEGATIVE mg/dL
Specific Gravity, Urine: 1.023 (ref 1.005–1.030)
pH: 6 (ref 5.0–8.0)

## 2024-08-17 LAB — COMPREHENSIVE METABOLIC PANEL WITH GFR
ALT: 23 U/L (ref 0–44)
AST: 27 U/L (ref 15–41)
Albumin: 4.4 g/dL (ref 3.5–5.0)
Alkaline Phosphatase: 98 U/L (ref 38–126)
Anion gap: 15 (ref 5–15)
BUN: 10 mg/dL (ref 6–20)
CO2: 26 mmol/L (ref 22–32)
Calcium: 10.3 mg/dL (ref 8.9–10.3)
Chloride: 97 mmol/L — ABNORMAL LOW (ref 98–111)
Creatinine, Ser: 0.85 mg/dL (ref 0.44–1.00)
GFR, Estimated: >60 mL/min (ref >60)
Glucose, Bld: 127 mg/dL — ABNORMAL HIGH (ref 70–99)
Potassium: 3.5 mmol/L (ref 3.5–5.1)
Sodium: 138 mmol/L (ref 135–145)
Total Bilirubin: 0.9 mg/dL (ref 0.0–1.2)
Total Protein: 7.6 g/dL (ref 6.5–8.1)

## 2024-08-17 LAB — CBC
HCT: 36.8 % (ref 36.0–46.0)
Hemoglobin: 12.6 g/dL (ref 12.0–15.0)
MCH: 29.2 pg (ref 26.0–34.0)
MCHC: 34.2 g/dL (ref 30.0–36.0)
MCV: 85.4 fL (ref 80.0–100.0)
Platelets: 235 K/uL (ref 150–400)
RBC: 4.31 MIL/uL (ref 3.87–5.11)
RDW: 12.7 % (ref 11.5–15.5)
WBC: 7.5 K/uL (ref 4.0–10.5)
nRBC: 0 % (ref 0.0–0.2)

## 2024-08-17 LAB — LIPASE, BLOOD: Lipase: 18 U/L (ref 11–51)

## 2024-08-17 MED ORDER — OXYCODONE HCL 5 MG PO TABS: 5.0000 mg | ORAL_TABLET | ORAL | 0 refills | Status: DC | PRN

## 2024-08-17 MED ORDER — HYDROMORPHONE HCL 1 MG/ML IJ SOLN
1.0000 mg | Freq: Once | INTRAMUSCULAR | Status: AC
  Administered 2024-08-17: 1 mg via INTRAVENOUS
  Filled 2024-08-17: qty 1

## 2024-08-17 MED ORDER — ONDANSETRON 4 MG PO TBDP: 4.0000 mg | ORAL_TABLET | Freq: Three times a day (TID) | ORAL | 0 refills | Status: DC | PRN

## 2024-08-17 MED ORDER — ONDANSETRON HCL 4 MG/2ML IJ SOLN
4.0000 mg | Freq: Once | INTRAMUSCULAR | Status: AC
  Administered 2024-08-17: 4 mg via INTRAVENOUS
  Filled 2024-08-17: qty 2

## 2024-08-17 NOTE — Telephone Encounter (Signed)
  FYI Only or Action Required?: FYI only for provider: patient called to state she was going to the ER for severe abdominal pain.  Patient was last seen in primary care on 02/10/2024 by Antonette Angeline ORN, NP.  Called Nurse Triage reporting Abdominal Pain.  Symptoms began several days ago.  Interventions attempted: Rest, hydration, or home remedies.  Symptoms are: gradually worsening.  Triage Disposition: Go to ED Now (Notify PCP)  Patient/caregiver understands and will follow disposition?: Yes                 Copied from CRM [id number]. Topic: Clinical - Red Word Triage >> Aug 17, 2024 12:41 PM Lonell PEDLAR wrote: Red Word that prompted transfer to Nurse Triage: Patient calling with abd pain, suspecting pancreatitis. Reason for Disposition  [1] SEVERE pain (e.g., excruciating) AND [2] present > 1 hour  Answer Assessment - Initial Assessment Questions Patient called to let her PCP know that she was going to the Emergency Room at this time for severe abdominal pain She states that it has been hurting for several days now She also states that she has requested a Zofran  prescription several times recently and it wasn't refilled but nobody ever explained to her why it wasn't refilled. Patient states that her pain is severe at this time and she suspects it might be pancreatitis so she is going to go to the Emergency Room.   She states that she has someone to drive her to the ER at this time and she is advised that if her pain gets worse she can call 911 at any point. She verbalized understanding.  Protocols used: Abdominal Pain - Female-A-AH

## 2024-08-17 NOTE — ED Provider Notes (Signed)
 [place] EMERGENCY DEPARTMENT AT [facility] Provider Note   CSN: [id number] Arrival date & time: 08/17/24  1320     Patient presents with: Abdominal Pain   Gloria Carter is a 58 y.o. female.   58 year old female with a history of GERD, chronic idiopathic pancreatitis, IBS, and fibromyalgia who presents to the emergency department abdominal pain.  Patient reports over the past 2 days she has developed cramping epigastric and left upper quadrant abdominal pain.  Worse with sitting up.  7/10 in severity.  No radiation.  Says she has had some nausea with it and has not wanted to eat but no vomiting.  Last bowel movement was yesterday and is still passing gas.  Says that it feels the same as prior episodes of pancreatitis.  No alcohol  use.  No known triggers for it.  Has been trying Pepto-Bismol and tramadol  at home without relief       Prior to Admission medications   Medication Sig Start Date End Date Taking? Authorizing Provider  ondansetron  (ZOFRAN -ODT) 4 MG disintegrating tablet Take 1 tablet (4 mg total) by mouth every 8 (eight) hours as needed for nausea or vomiting. 08/17/24  Yes Yolande Lamar BROCKS, MD  oxyCODONE  (ROXICODONE ) 5 MG immediate release tablet Take 1 tablet (5 mg total) by mouth every 4 (four) hours as needed. 08/17/24  Yes Yolande Lamar BROCKS, MD  AIRSUPRA  90-80 MCG/ACT AERO INHALE 1 PUFF INTO THE LUNGS EVERY 4 HOURS AS NEEDED 10/21/23   Antonette Angeline ORN, NP  amphetamine -dextroamphetamine  (ADDERALL) 10 MG tablet Take 1 tablet (10 mg total) by mouth 2 (two) times daily with a meal. 06/22/24   Baity, Angeline ORN, NP  aspirin  EC 81 MG tablet Take 1 tablet (81 mg total) by mouth daily. Swallow whole. Patient not taking: Reported on 02/10/2024 06/17/22   Antonette Angeline ORN, NP  b complex vitamins tablet Take 1 tablet by mouth daily.    [provider]  Biotin 1000 MCG CHEW Chew 5,000 mcg by mouth.    [provider]  budesonide -formoterol  (SYMBICORT )  160-4.5 MCG/ACT inhaler Inhale 2 puffs into the lungs 2 (two) times daily. 07/13/24   Antonette Angeline ORN, NP  Calcium-Magnesium -Zinc 167-83-8 MG TABS Take by mouth.    [provider]  carisoprodol  (SOMA ) 350 MG tablet TAKE 1 TABLET BY MOUTH FOUR TIMES DAILY AS NEEDED FOR MUSCLE SPASMS 08/13/24   Antonette Angeline ORN, NP  cetirizine (ZYRTEC) 10 MG tablet Take 10 mg by mouth daily as needed for allergies.     [provider]  cholecalciferol (VITAMIN D ) 400 UNITS TABS Take 400 Units by mouth daily.    [provider]  citalopram  (CELEXA ) 20 MG tablet TAKE 1 TABLET(20 MG) BY MOUTH DAILY 02/13/24   Antonette Angeline ORN, NP  CREON  [phone] units CPEP capsule TAKE 1 CAPSULES BY MOUTH AS DIRECTED 4 CAPSULES WITH A MEAL AND 1 CAPSULE WITH SNACKS 09/07/23   Antonette Angeline ORN, NP  esomeprazole  (NEXIUM ) 40 MG capsule TAKE 1 CAPSULE(40 MG) BY MOUTH DAILY 01/12/24   Antonette Angeline ORN, NP  ipratropium (ATROVENT ) 0.03 % nasal spray Place 2 sprays into the nose 4 (four) times daily as needed for rhinitis. 06/28/23   Antonette Angeline ORN, NP  levothyroxine  (SYNTHROID ) 75 MCG tablet TAKE 1 TABLET BY MOUTH DAILY BEFORE BREAKFAST, ON AN EMPTY STOMACH WITH A SMALL GLASS OF WATER, NO MEDS OR FOOD FOR 20 MINUTES. 02/13/24   Antonette Angeline ORN, NP  montelukast  (SINGULAIR ) 10 MG tablet TAKE 1  TABLET(10 MG) BY MOUTH EVERY MORNING 05/25/24   Antonette Angeline ORN, NP  pregabalin  (LYRICA ) 150 MG capsule TAKE 1 CAPSULE(150 MG) BY MOUTH THREE TIMES DAILY 08/13/24   Antonette Angeline ORN, NP  SUMAtriptan  (IMITREX ) 100 MG tablet TAKE 1 TAB BY MOUTH EVERY 2 HOURS AS NEEDED FOR MIGRAINE. NO MORE THAN 2 DOSES IN 24 HOURS. 01/07/23   Antonette Angeline ORN, NP  telmisartan -hydrochlorothiazide  (MICARDIS  HCT) 80-25 MG tablet TAKE 1 TABLET BY MOUTH DAILY 08/13/24   Antonette Angeline ORN, NP  traMADol  (ULTRAM ) 50 MG tablet TAKE 1 TABLET(50 MG) BY MOUTH THREE TIMES DAILY AS NEEDED 08/13/24   Antonette Angeline ORN, NP  vitamin C (ASCORBIC ACID) 500 MG tablet Take 1,000 mg by  mouth daily.    [provider]  Vitamin D , Ergocalciferol , (DRISDOL ) 1.25 MG (50000 UNIT) CAPS capsule Take 1 capsule (50,000 Units total) by mouth once a week. For 12 weeks. Then start OTC Vitamin D3 2,000 unit daily. 06/07/23   Antonette Angeline ORN, NP  vitamin E 400 UNIT capsule Take 400 Units by mouth daily.    [provider]    Allergies: Soap and Sulfa antibiotics    Review of Systems  Updated Vital Signs BP (!) 152/73   Pulse 95   Temp 97.9 F (36.6 C) (Oral)   Resp 20   SpO2 95%   Physical Exam Vitals and nursing note reviewed.  Constitutional:      General: She is not in acute distress.    Appearance: She is well-developed.  HENT:     Head: Normocephalic and atraumatic.     Right Ear: External ear normal.     Left Ear: External ear normal.     Nose: Nose normal.  Eyes:     Extraocular Movements: Extraocular movements intact.     Conjunctiva/sclera: Conjunctivae normal.     Pupils: Pupils are equal, round, and reactive to light.  Abdominal:     General: Abdomen is flat. There is no distension.     Palpations: Abdomen is soft. There is no mass.     Tenderness: There is abdominal tenderness (Mild epigastric and left upper quadrant). There is no guarding.  Musculoskeletal:     Cervical back: Normal range of motion and neck supple.  Skin:    General: Skin is warm and dry.  Neurological:     Mental Status: She is alert and oriented to person, place, and time. Mental status is at baseline.  Psychiatric:        Mood and Affect: Mood normal.     (all labs ordered are listed, but only abnormal results are displayed) Labs Reviewed  COMPREHENSIVE METABOLIC PANEL WITH GFR - Abnormal; Notable for the following components:      Result Value   Chloride 97 (*)    Glucose, Bld 127 (*)    All other components within normal limits  URINALYSIS, ROUTINE W REFLEX MICROSCOPIC - Abnormal; Notable for the following components:   Hgb urine dipstick TRACE (*)    All  other components within normal limits  LIPASE, BLOOD  CBC    EKG: EKG Interpretation Date/Time:  Friday August 17 2024 13:30:21 EST Ventricular Rate:  110 PR Interval:  150 QRS Duration:  88 QT Interval:  350 QTC Calculation: 473 R Axis:   46  Text Interpretation: Sinus tachycardia Possible Inferior infarct (cited on or before 01-Mar-2014) Anterolateral infarct , age undetermined Abnormal ECG When compared with ECG of 24-Apr-2017 10:22, Anterior infarct is now Present Anterolateral  infarct is now Present Nonspecific T wave abnormality now evident in Anterior leads Confirmed by Yolande Charleston [id number]) on 08/17/2024 4:29:41 PM  Radiology: No results found.   Procedures   Medications Ordered in the ED  HYDROmorphone  (DILAUDID ) injection 1 mg (1 mg Intravenous Given 08/17/24 1659)  ondansetron  (ZOFRAN ) injection 4 mg (4 mg Intravenous Given 08/17/24 1658)                                    Medical Decision Making Amount and/or Complexity of Data Reviewed Labs: ordered.  Risk Prescription drug management.   Gloria Carter is a 58 year old female with a history of GERD, chronic idiopathic pancreatitis, IBS, and fibromyalgia who presents to the emergency department abdominal pain.    Initial Ddx:  Acute on chronic pancreatitis, chronic abdominal pain, gastritis/peptic ulcer disease, bowel obstruction, cholecystitis  MDM/Course:  Patient presents emergency department with epigastric abdominal pain.  Has had nausea as well but no significant vomiting.  Bowel movements are normal and she still passing gas.  On exam does have some epigastric and left upper quadrant abdominal tenderness to palpation but no significant distention.  No rebound or guarding.  No right upper quadrant tenderness palpation.  I suspect that her symptoms are likely due to her chronic abdominal pain from her chronic idiopathic pancreatitis.  Of note, her lipase was normal but sometimes this can be normal  in this disease process.  Also on the differential would be peptic ulcer disease but she is already on antacids at home and has been taking Pepto-Bismol for this.  Was given some pain and nausea medication and upon re-evaluation was feeling much better and is tolerating p.o.  Did consider repeat imaging for the patient today but she is already had 6 CTs of her abdomen as well as an MRI of her abdomen which is only showed sequelae of chronic pancreatitis so we will hold off on repeating it at this point in time.  Was given pain and nausea medicine to take at home.  Instructed to return to the emergency department should her symptoms worsen and we may need advanced imaging at that point in time.  This patient presents to the ED for concern of complaints listed in HPI, this involves an extensive number of treatment options, and is a complaint that carries with it a high risk of complications and morbidity. Disposition including potential need for admission considered.   Dispo: DC Home. Return precautions discussed including, but not limited to, those listed in the AVS. Allowed pt time to ask questions which were answered fully prior to dc.  Additional history obtained from father Records reviewed ED Visit Notes The following labs were independently interpreted: Chemistry and show no acute abnormality I personally reviewed and interpreted cardiac monitoring: normal sinus rhythm  I personally reviewed and interpreted the pt's EKG: see above for interpretation  I have reviewed the patients home medications and made adjustments as needed  Portions of this note were generated with Dragon dictation software. Dictation errors may occur despite best attempts at proofreading.    Final diagnoses:  Idiopathic chronic pancreatitis (HCC)  Nausea    ED Discharge Orders          Ordered    oxyCODONE  (ROXICODONE ) 5 MG immediate release tablet  Every 4 hours PRN        08/17/24 1748    ondansetron  (ZOFRAN -ODT)  4 MG disintegrating tablet  Every 8 hours PRN        08/17/24 1748               Yolande Lamar BROCKS, MD 08/17/24 1815

## 2024-08-17 NOTE — ED Notes (Signed)
 PT flinching and complaining of pain when blood pressure is obtained on wrist, This tech explained importance of BP for medication and health status in the department. UA sent for collection.

## 2024-08-17 NOTE — Discharge Instructions (Signed)
 You were seen for your chronic pancreatitis in the emergency department.   At home, please take the zofran  for nausea and vomiting. Take tylenol  for your abdominal pain. You may also take the oxycodone  we have prescribed you for any breakthrough pain that may have.  Do not take this before driving or operating heavy machinery.  Do not take this medication with alcohol  or at the same time as your tramadol .    Check your MyChart online for the results of any tests that had not resulted by the time you left the emergency department.   Follow-up with your primary doctor in 2-3 days regarding your visit.    Return immediately to the emergency department if you experience any of the following: worsening pain, vomiting despite the medication, or any other concerning symptoms.    Thank you for visiting our Emergency Department. It was a pleasure taking care of you today.

## 2024-08-17 NOTE — ED Notes (Signed)
Patient is drinking water.

## 2024-08-17 NOTE — Progress Notes (Deleted)
 Subjective:    Patient ID: Gloria Carter, female    DOB: [birth year], 58 y.o.   MRN: [id number]  HPI  Patient presents to clinic today for 74-month follow-up of chronic conditions.  ADD: She reports mainly inattention.  She is taking Adderall as prescribed.  She does not follow with psychiatry.  Asthma: Moderate, persistent.  She reports chronic cough and shortness of breath.  She is taking singulair , symbicort  and albuterol  as prescribed.  She does not follow with asthma and allergy or pulmonology.  There are no PFTs on file.  Fibromyalgia/chronic joint pain: Mainly in her neck, low back, hips and knees. She recently had injections in her knees by orthopedics which seemed to help. She is taking soma , pregabalin  and tramadol  as prescribed.  She does not follow with pain management.  HTN: Her BP today is 126/84.  She is taking telmisartan  HCT as prescribed.  ECG from 04/2017 reviewed.  Hypothyroidism: She denies any issues on her current dose of levothyroxine .  She does not follow with endocrinology.  Chronic pancreatitis: Managed with pancrelipase .  She is not currently following GI.  GERD: She is not sure what triggers this.  She has occassional breakthrough on esomeprazole  for with she takes zantac  with good relief of symptoms.  There is no upper GI on file.  Migraines: These occur every few months.  Triggered by smells, artificial sweeteners.  She is taking sumatriptan  as needed.  She does not follow with neurology.  Anxiety and depression: Chronic, managed on citalopram .  She is not currently seeing a therapist.  She denies SI/HI.  Prediabetes: Her last A1c was 5.8%, 02/2024.  She is not taking any oral diabetic medications time.  She does not check her sugars.  HLD with aortic atherosclerosis: Her last LDL was 158, triglycerides 149, 02/2024.  She does not take any cholesterol-lowering medications time. She has taken cholesterol medication in the past.  She is taking aspirin .  She  does not consume a low-fat diet.  Review of Systems     Past Medical History:  Diagnosis Date   ADD (attention deficit disorder)    Allergy    ENVIRONMENTAL   Arthritis    knees   Asthma    Depression    Difficulty sleeping    Dysphagia    Fibromyalgia    GERD (gastroesophageal reflux disease)    Hernia of abdominal wall    Hyperlipidemia    Hypertension    IBS (irritable bowel syndrome)    Migraine    Numbness and tingling in hands    Obesity    Swelling of both ankles    Thyroid  disease     Current Outpatient Medications  Medication Sig Dispense Refill   AIRSUPRA  90-80 MCG/ACT AERO INHALE 1 PUFF INTO THE LUNGS EVERY 4 HOURS AS NEEDED 10.7 g 2   amphetamine -dextroamphetamine  (ADDERALL) 10 MG tablet Take 1 tablet (10 mg total) by mouth 2 (two) times daily with a meal. 60 tablet 0   aspirin  EC 81 MG tablet Take 1 tablet (81 mg total) by mouth daily. Swallow whole. (Patient not taking: Reported on 02/10/2024) 30 tablet 12   b complex vitamins tablet Take 1 tablet by mouth daily.     Biotin 1000 MCG CHEW Chew 5,000 mcg by mouth.     budesonide -formoterol  (SYMBICORT ) 160-4.5 MCG/ACT inhaler Inhale 2 puffs into the lungs 2 (two) times daily. 1 each 2   Calcium-Magnesium -Zinc 167-83-8 MG TABS Take by mouth.     carisoprodol  (SOMA ) 350  MG tablet TAKE 1 TABLET BY MOUTH FOUR TIMES DAILY AS NEEDED FOR MUSCLE SPASMS 90 tablet 0   cetirizine (ZYRTEC) 10 MG tablet Take 10 mg by mouth daily as needed for allergies.      cholecalciferol (VITAMIN D ) 400 UNITS TABS Take 400 Units by mouth daily.     citalopram  (CELEXA ) 20 MG tablet TAKE 1 TABLET(20 MG) BY MOUTH DAILY 90 tablet 1   CREON  36000-114000 units CPEP capsule TAKE 1 CAPSULES BY MOUTH AS DIRECTED 4 CAPSULES WITH A MEAL AND 1 CAPSULE WITH SNACKS 400 capsule 5   esomeprazole  (NEXIUM ) 40 MG capsule TAKE 1 CAPSULE(40 MG) BY MOUTH DAILY 90 capsule 1   ipratropium (ATROVENT ) 0.03 % nasal spray Place 2 sprays into the nose 4 (four) times  daily as needed for rhinitis. 30 mL 0   levothyroxine  (SYNTHROID ) 75 MCG tablet TAKE 1 TABLET BY MOUTH DAILY BEFORE BREAKFAST, ON AN EMPTY STOMACH WITH A SMALL GLASS OF WATER, NO MEDS OR FOOD FOR 20 MINUTES. 90 tablet 1   montelukast  (SINGULAIR ) 10 MG tablet TAKE 1 TABLET(10 MG) BY MOUTH EVERY MORNING 90 tablet 0   pregabalin  (LYRICA ) 150 MG capsule TAKE 1 CAPSULE(150 MG) BY MOUTH THREE TIMES DAILY 90 capsule 0   SUMAtriptan  (IMITREX ) 100 MG tablet TAKE 1 TAB BY MOUTH EVERY 2 HOURS AS NEEDED FOR MIGRAINE. NO MORE THAN 2 DOSES IN 24 HOURS. 8 tablet 5   telmisartan -hydrochlorothiazide  (MICARDIS  HCT) 80-25 MG tablet TAKE 1 TABLET BY MOUTH DAILY 90 tablet 1   traMADol  (ULTRAM ) 50 MG tablet TAKE 1 TABLET(50 MG) BY MOUTH THREE TIMES DAILY AS NEEDED 90 tablet 0   vitamin C (ASCORBIC ACID) 500 MG tablet Take 1,000 mg by mouth daily.     Vitamin D , Ergocalciferol , (DRISDOL ) 1.25 MG (50000 UNIT) CAPS capsule Take 1 capsule (50,000 Units total) by mouth once a week. For 12 weeks. Then start OTC Vitamin D3 2,000 unit daily. 12 capsule 0   vitamin E 400 UNIT capsule Take 400 Units by mouth daily.     No current facility-administered medications for this visit.    Allergies  Allergen Reactions   Soap     Any soaps with fragrance cause migraines   Sulfa Antibiotics Nausea And Vomiting    Family History  Problem Relation Age of Onset   COPD Mother    Fibromyalgia Mother    Heart disease Father    Colon polyps Father    Stroke Sister    Fibromyalgia Sister    Colon polyps Maternal Grandmother    Lung disease Paternal Grandmother    Stroke Paternal Grandmother    Diabetes Paternal Grandfather    Heart disease Paternal Grandfather    Colon polyps Maternal Aunt    Aortic aneurysm Maternal Aunt    Cystic kidney disease Maternal Aunt        kidney cyst   Kidney disease Maternal Aunt    Diabetes Maternal Uncle    Fibromyalgia Other        cousin   Colon cancer Neg Hx    Breast cancer Neg Hx      Social History   Socioeconomic History   Marital status: Divorced    Spouse name: Not on file   Number of children: 0   Years of education: Not on file   Highest education level: Not on file  Occupational History   Occupation: Child Psychotherapist    Employer: [employer]  Tobacco Use   Smoking status: Never  Smokeless tobacco: Never  Substance and Sexual Activity   Alcohol  use: No    Alcohol /week: 0.0 standard drinks of alcohol    Drug use: No   Sexual activity: Not on file  Other Topics Concern   Not on file  Social History Narrative   Married. Education: [employer].    Social Drivers of Corporate Investment Banker Strain: Not on file  Food Insecurity: Not on file  Transportation Needs: Not on file  Physical Activity: Not on file  Stress: Not on file  Social Connections: Not on file  Intimate Partner Violence: Not on file     Constitutional: Patient reports chronic fatigue, intermittent headaches.  Denies fever, malaise, or abrupt weight changes.  HEENT: Denies eye pain, eye redness, ear pain, ringing in the ears, wax buildup, runny nose, nasal congestion, bloody nose, or sore throat. Respiratory: Patient reports intermittent cough and shortness of breath.  Denies difficulty breathing, or sputum production.   Cardiovascular: Pt reports swelling in legs. Denies chest pain, chest tightness, palpitations or swelling in the hands.  Gastrointestinal: Pt reports intermittent constipation. Denies abdominal pain, bloating, diarrhea or blood in the stool.  GU: Denies urgency, frequency, pain with urination, burning sensation, blood in urine, odor or discharge. Musculoskeletal: Patient reports chronic joint and muscle pain, difficulty with gait.  Denies decrease in range of motion, or joint swelling.  Skin: Denies redness, rashes, lesions or ulcercations.  Neurological: Patient reports an intention.  Denies dizziness, difficulty with memory, difficulty with speech or problems with  balance and coordination.  Psych: Patient has a history of anxiety and depression.  Denies SI/HI.  No other specific complaints in a complete review of systems (except as listed in HPI above).  Objective:   Physical Exam  There were no vitals taken for this visit.  Wt Readings from Last 3 Encounters:  02/10/24 293 lb 12.8 oz (133.3 kg)  06/28/23 290 lb (131.5 kg)  06/06/23 294 lb (133.4 kg)    General: Appears her stated age, obese, in NAD. Skin: Warm, dry and intact.  HEENT: Head: normal shape and size; Eyes: sclera white, no icterus, conjunctiva pink, PERRLA and EOMs intact;  Neck:  Neck supple, trachea midline. No masses, lumps present. Thyromegaly noted. Cardiovascular: Tachycardic with normal rhythm. S1,S2 noted.  No murmur, rubs or gallops noted. No JVD or BLE edema. No carotid bruits noted. Pulmonary/Chest: Normal effort and positive vesicular breath sounds. No respiratory distress. No wheezes, rales or ronchi noted.  Abdomen: Soft and nontender. Normal bowel sounds.  Musculoskeletal: Gait slow and steady with use of walker. Neurological: Alert and oriented. Cranial nerves II-XII grossly intact. Coordination normal.  Psychiatric: Mood and affect normal. Behavior is normal. Judgment and thought content normal.    BMET    Component Value Date/Time   NA 128 (L) 02/10/2024 1346   NA 142 01/05/2019 1601   K 3.8 02/10/2024 1346   CL 89 (L) 02/10/2024 1346   CO2 29 02/10/2024 1346   GLUCOSE 97 02/10/2024 1346   BUN 22 02/10/2024 1346   BUN 12 01/05/2019 1601   CREATININE 0.90 02/10/2024 1346   CALCIUM 9.6 02/10/2024 1346   GFRNONAA >60 03/28/2020 1052   GFRNONAA >89 08/23/2014 1707   GFRAA >60 03/28/2020 1052   GFRAA >89 08/23/2014 1707    Lipid Panel     Component Value Date/Time   CHOL 240 (H) 02/10/2024 1346   CHOL 221 (H) 01/05/2019 1601   TRIG 149 02/10/2024 1346   HDL 54 02/10/2024 1346  HDL 41 01/05/2019 1601   CHOLHDL 4.4 02/10/2024 1346   VLDL 33  08/31/2013 0918   LDLCALC 158 (H) 02/10/2024 1346    CBC    Component Value Date/Time   WBC 9.9 02/10/2024 1346   RBC 4.19 02/10/2024 1346   HGB 12.3 02/10/2024 1346   HGB 12.8 06/15/2017 1722   HCT 36.1 02/10/2024 1346   HCT 38.2 06/15/2017 1722   PLT 184 02/10/2024 1346   PLT 216 06/15/2017 1722   MCV 86.2 02/10/2024 1346   MCV 90.5 01/23/2019 1400   MCV 88 06/15/2017 1722   MCH 29.4 02/10/2024 1346   MCHC 34.1 02/10/2024 1346   RDW 12.2 02/10/2024 1346   RDW 13.9 06/15/2017 1722   LYMPHSABS 2.1 03/28/2020 1052   LYMPHSABS 2.1 06/15/2017 1722   MONOABS 0.9 03/28/2020 1052   EOSABS 0.3 03/28/2020 1052   EOSABS 0.2 06/15/2017 1722   BASOSABS 0.1 03/28/2020 1052   BASOSABS 0.0 06/15/2017 1722    Hgb A1C Lab Results  Component Value Date   HGBA1C 5.8 (H) 02/10/2024           Assessment & Plan:      RTC in 6 months, for your annual exam Angeline Laura, NP

## 2024-08-17 NOTE — ED Triage Notes (Signed)
 Patient says I think I have pancreatitis again. She says she has had it in the past and 2 days ago she had nausea and abdominal ache. She said she does not know what exacerbates her pancreatitis.

## 2024-08-17 NOTE — Telephone Encounter (Signed)
 Previous message sent back to provider.

## 2024-08-17 NOTE — Telephone Encounter (Signed)
 Copied from CRM #[id number]. Topic: Clinical - Medical Advice >> Aug 17, 2024 12:41 PM Lonell PEDLAR wrote: Reason for CRM: Patient going to the ER for abd pain, suspecting pancreatitis.

## 2024-08-25 ENCOUNTER — Other Ambulatory Visit: Payer: Self-pay | Admitting: Internal Medicine

## 2024-08-28 NOTE — Telephone Encounter (Signed)
 Requested Prescriptions  Pending Prescriptions Disp Refills   levothyroxine  (SYNTHROID ) 75 MCG tablet [Pharmacy Med Name: LEVOTHYROXINE  0.075MG  ( ) TABS] 90 tablet 0    Sig: TAKE 1 TABLET BY MOUTH DAILY BEFORE BREAKFAST, ON AN EMPTY STOMACH WITH A SMALL GLASS OF WATER, NO MEDS OR FOOD FOR 20 MINUTES.     Endocrinology:  Hypothyroid Agents Passed - 08/28/2024  9:55 AM      Passed - TSH in normal range and within 360 days    TSH  Date Value Ref Range Status  02/10/2024 2.52 0.40 - 4.50 mIU/L Final         Passed - Valid encounter within last 12 months    Recent Outpatient Visits           6 months ago Encounter for general adult medical examination with abnormal findings   [facility] [place], Angeline ORN, NP   5 years ago Cough   [facility] at [facility], Santana LABOR, MD   5 years ago Essential hypertension   Primary Care at [facility], [place], MD   5 years ago Attention deficit hyperactivity disorder (ADHD), combined type   Primary Care at [facility], [place] A, MD   5 years ago Idiopathic chronic pancreatitis [facility])   [facility] at [facility], Elyn SAILOR, MD               citalopram  (CELEXA ) 20 MG tablet [Pharmacy Med Name: CITALOPRAM  20MG  TABLETS] 90 tablet 0    Sig: TAKE 1 TABLET(20 MG) BY MOUTH DAILY     Psychiatry:  Antidepressants - SSRI Failed - 08/28/2024  9:55 AM      Failed - Valid encounter within last 6 months    Recent Outpatient Visits           6 months ago Encounter for general adult medical examination with abnormal findings   [facility] [place], Angeline ORN, NP   5 years ago Cough   Primary Care at [facility], Santana LABOR, MD   5 years ago Essential hypertension   Primary Care at [facility], [place], MD   5 years ago Attention deficit hyperactivity disorder (ADHD), combined type   Primary Care at [facility], [place] A, MD   5 years ago Idiopathic chronic pancreatitis [facility])   Primary  Care at [facility], Elyn SAILOR, MD              Passed - Completed PHQ-2 or PHQ-9 in the last 360 days       montelukast  (SINGULAIR ) 10 MG tablet [Pharmacy Med Name: MONTELUKAST  10MG  TABLETS] 90 tablet 0    Sig: TAKE 1 TABLET(10 MG) BY MOUTH EVERY MORNING     Pulmonology:  Leukotriene Inhibitors Passed - 08/28/2024  9:55 AM      Passed - Valid encounter within last 12 months    Recent Outpatient Visits           6 months ago Encounter for general adult medical examination with abnormal findings   [facility] [place], Angeline ORN, NP   5 years ago Cough   Primary Care at [facility], Santana LABOR, MD   5 years ago Essential hypertension   Primary Care at [facility], Santana LABOR, MD   5 years ago Attention deficit hyperactivity disorder (ADHD), combined type   Primary Care at [facility], Zoe A, MD   5 years ago Idiopathic chronic pancreatitis (HCC)  Primary [facility] at [facility], Elyn SAILOR, MD

## 2024-08-31 ENCOUNTER — Other Ambulatory Visit: Payer: Self-pay | Admitting: Internal Medicine

## 2024-08-31 DIAGNOSIS — F902 Attention-deficit hyperactivity disorder, combined type: Secondary | ICD-10-CM

## 2024-08-31 NOTE — Telephone Encounter (Unsigned)
 Copied from CRM #[id number]. Topic: Clinical - Medication Refill >> Aug 31, 2024  3:52 PM Gloria Carter wrote: Medication: amphetamine -dextroamphetamine  (ADDERALL) 10 MG tablet  Has the patient contacted their pharmacy? No (Agent: If no, request that the patient contact the pharmacy for the refill. If patient does not wish to contact the pharmacy document the reason why and proceed with request.) (Agent: If yes, when and what did the pharmacy advise?)  This is the patient's preferred pharmacy:  [id number] [place], [place] - [address] AT [facility] & [address] [place] [address] Phone: [phone] Fax: [phone]   Is this the correct pharmacy for this prescription? Yes If no, delete pharmacy and type the correct one.   Has the prescription been filled recently? No  Is the patient out of the medication? Yes  Has the patient been seen for an appointment in the last year OR does the patient have an upcoming appointment? Yes  Can we respond through MyChart? Yes  Agent: Please be advised that Rx refills may take up to 3 business days. We ask that you follow-up with your pharmacy.

## 2024-08-31 NOTE — Telephone Encounter (Unsigned)
 Copied from CRM #[id number]. Topic: Clinical - Medication Refill >> Aug 31, 2024  3:53 PM Hadassah PARAS wrote: Medication: ondansetron  (ZOFRAN -ODT) 4 MG disintegrating tablet   Has the patient contacted their pharmacy? No (Agent: If no, request that the patient contact the pharmacy for the refill. If patient does not wish to contact the pharmacy document the reason why and proceed with request.) (Agent: If yes, when and what did the pharmacy advise?)  This is the patient's preferred pharmacy:  [id number] [place], [place] - [address] AT [facility] & [address] [place] [address] Phone: [phone] Fax: [phone]    Is this the correct pharmacy for this prescription? Yes If no, delete pharmacy and type the correct one.   Has the prescription been filled recently? Yes  Is the patient out of the medication? Yes  Has the patient been seen for an appointment in the last year OR does the patient have an upcoming appointment? Yes  Can we respond through MyChart? Yes  Agent: Please be advised that Rx refills may take up to 3 business days. We ask that you follow-up with your pharmacy.

## 2024-09-01 NOTE — Telephone Encounter (Signed)
 Requested medication (s) are due for refill today: Yes  Requested medication (s) are on the active medication list: Yes  Last refill:  06/22/24  Future visit scheduled: Yes  Notes to clinic:  Unable to refill per protocol, cannot delegate.      Requested Prescriptions  Pending Prescriptions Disp Refills   amphetamine -dextroamphetamine  (ADDERALL) 10 MG tablet 60 tablet 0    Sig: Take 1 tablet (10 mg total) by mouth 2 (two) times daily with a meal.     Not Delegated - Psychiatry:  Stimulants/ADHD Failed - 09/01/2024 11:30 AM      Failed - This refill cannot be delegated      Failed - Urine Drug Screen completed in last 360 days      Failed - Last BP in normal range    BP Readings from Last 1 Encounters:  08/17/24 (!) 152/73         Failed - Valid encounter within last 6 months    Recent Outpatient Visits           6 months ago Encounter for general adult medical examination with abnormal findings   [facility] [place], Angeline ORN, NP   5 years ago Cough   Primary Care at [facility], Santana LABOR, MD   5 years ago Essential hypertension   Primary Care at [facility], [place], MD   5 years ago Attention deficit hyperactivity disorder (ADHD), combined type   Primary Care at [facility], Santana LABOR, MD   5 years ago Idiopathic chronic pancreatitis [facility])   Primary Care at [facility], Elyn SAILOR, MD              Passed - Last Heart Rate in normal range    Pulse Readings from Last 1 Encounters:  08/17/24 95

## 2024-09-01 NOTE — Telephone Encounter (Signed)
 Requested medication (s) are due for refill today: Yes  Requested medication (s) are on the active medication list: Yes  Last refill:  08/17/24  Future visit scheduled: Yes  Notes to clinic:  Unable to refill per protocol, cannot delegate.      Requested Prescriptions  Pending Prescriptions Disp Refills   ondansetron  (ZOFRAN -ODT) 4 MG disintegrating tablet 12 tablet 0    Sig: Take 1 tablet (4 mg total) by mouth every 8 (eight) hours as needed for nausea or vomiting.     Not Delegated - Gastroenterology: Antiemetics - ondansetron  Failed - 09/01/2024 11:28 AM      Failed - This refill cannot be delegated      Failed - Valid encounter within last 6 months    Recent Outpatient Visits           6 months ago Encounter for general adult medical examination with abnormal findings   [facility] [place], Angeline ORN, NP   5 years ago Cough   Primary Care at [facility], Santana LABOR, MD   5 years ago Essential hypertension   Primary Care at [facility], [place], MD   5 years ago Attention deficit hyperactivity disorder (ADHD), combined type   Primary Care at [facility], [place] A, MD   5 years ago Idiopathic chronic pancreatitis [facility])   [facility] at [facility], Elyn SAILOR, MD              Passed - AST in normal range and within 360 days    AST  Date Value Ref Range Status  08/17/2024 27 15 - 41 U/L Final         Passed - ALT in normal range and within 360 days    ALT  Date Value Ref Range Status  08/17/2024 23 0 - 44 U/L Final

## 2024-09-03 MED ORDER — AMPHETAMINE-DEXTROAMPHETAMINE 10 MG PO TABS: 10.0000 mg | ORAL_TABLET | Freq: Two times a day (BID) | ORAL | 0 refills | Status: AC

## 2024-09-05 ENCOUNTER — Telehealth: Payer: Self-pay

## 2024-09-05 NOTE — Telephone Encounter (Signed)
 Spoke with patient, explained to her that we can not fill this prescription she would need to call the ER for refill or be seen there. She was very disappointed. Attempted to explain to her that we haven't seen her since May and she would need an appointment.

## 2024-09-05 NOTE — Telephone Encounter (Signed)
 Copied from CRM #[id number]. Topic: Clinical - Prescription Issue >> Sep 05, 2024  1:14 PM Delon T wrote: Reason for CRM: ondansetron  (ZOFRAN -ODT) 4 MG disintegrating tablet- was originally prescribed by the ER doctor, having issues with pancreatitis and need this medication

## 2024-09-05 NOTE — Telephone Encounter (Signed)
 Pt will need to be seen for ER followup for refill.

## 2024-09-21 ENCOUNTER — Encounter: Payer: Self-pay | Admitting: Internal Medicine

## 2024-09-21 ENCOUNTER — Ambulatory Visit: Admitting: Internal Medicine

## 2024-09-21 VITALS — BP 140/80 | Ht 62.0 in | Wt 271.2 lb

## 2024-09-21 DIAGNOSIS — R7303 Prediabetes: Secondary | ICD-10-CM

## 2024-09-21 DIAGNOSIS — E039 Hypothyroidism, unspecified: Secondary | ICD-10-CM | POA: Diagnosis not present

## 2024-09-21 DIAGNOSIS — M797 Fibromyalgia: Secondary | ICD-10-CM

## 2024-09-21 DIAGNOSIS — G894 Chronic pain syndrome: Secondary | ICD-10-CM

## 2024-09-21 DIAGNOSIS — E782 Mixed hyperlipidemia: Secondary | ICD-10-CM | POA: Diagnosis not present

## 2024-09-21 DIAGNOSIS — I10 Essential (primary) hypertension: Secondary | ICD-10-CM | POA: Diagnosis not present

## 2024-09-21 DIAGNOSIS — F902 Attention-deficit hyperactivity disorder, combined type: Secondary | ICD-10-CM

## 2024-09-21 DIAGNOSIS — F411 Generalized anxiety disorder: Secondary | ICD-10-CM

## 2024-09-21 DIAGNOSIS — K219 Gastro-esophageal reflux disease without esophagitis: Secondary | ICD-10-CM

## 2024-09-21 DIAGNOSIS — K861 Other chronic pancreatitis: Secondary | ICD-10-CM | POA: Diagnosis not present

## 2024-09-21 DIAGNOSIS — I700 Atherosclerosis of aorta: Secondary | ICD-10-CM

## 2024-09-21 DIAGNOSIS — F331 Major depressive disorder, recurrent, moderate: Secondary | ICD-10-CM | POA: Diagnosis not present

## 2024-09-21 DIAGNOSIS — G43C1 Periodic headache syndromes in child or adult, intractable: Secondary | ICD-10-CM

## 2024-09-21 DIAGNOSIS — J4540 Moderate persistent asthma, uncomplicated: Secondary | ICD-10-CM | POA: Diagnosis not present

## 2024-09-21 MED ORDER — ALBUTEROL SULFATE HFA 108 (90 BASE) MCG/ACT IN AERS: 2.0000 | INHALATION_SPRAY | Freq: Four times a day (QID) | RESPIRATORY_TRACT | 0 refills | Status: DC | PRN

## 2024-09-21 MED ORDER — CARISOPRODOL 350 MG PO TABS: ORAL_TABLET | ORAL | 0 refills | Status: DC

## 2024-09-21 MED ORDER — ONDANSETRON 8 MG PO TBDP: 8.0000 mg | ORAL_TABLET | Freq: Three times a day (TID) | ORAL | 0 refills | Status: DC | PRN

## 2024-09-21 MED ORDER — PREGABALIN 150 MG PO CAPS: ORAL_CAPSULE | ORAL | 0 refills | Status: DC

## 2024-09-21 MED ORDER — TRAMADOL HCL 50 MG PO TABS: ORAL_TABLET | ORAL | 0 refills | Status: DC

## 2024-09-21 NOTE — Patient Instructions (Signed)
 Clear Liquid Diet, Adult A clear liquid diet is a diet that includes only liquids and semi-liquids that you can see through when you hold them up to a light. An example of a semi-liquid is gelatin. No solid food is eaten on this diet. You may need to follow a clear liquid diet if: You have a problem right before or after you have surgery. You did not eat food for a long time. You had: Vomiting, feeling like you may vomit, or nausea. Diarrhea, which is watery poop. You are going to have a test to look at your digestive system. You are going to have bowel surgery. What are the goals of this diet? To help your stomach and digestive system rest. To help clear your digestive system before a test. To make sure that you get enough fluid. To make sure you get some calories for energy. To help you get back to eating like you used to. What are tips for following this plan? This diet does not give you all the nutrients that you need. Choose a variety of the liquids that your health care provider says you can have on this diet. That way, you will get as many nutrients as possible. If you are not sure whether you can have certain things, ask your provider. If you cannot swallow a thin liquid, you will need to thicken it before drinking it. This will stop you from breathing it in. What foods should I eat?  Water and flavored water. Fruit juices that do not have pulp in them. This includes cranberry juice, apple juice, and grape juice. Tea and coffee without milk or cream. Clear broth. Broth-based soups that have been strained to get all the food pieces out. Flavored gelatin. Honey. Sugar water. Ice or frozen ice pops that do not have any milk, yogurt, fruit pieces, or fruit pulp in them. Clear sodas. Clear sports drinks. The items listed above may not be all the foods and drinks you can have. Talk with an expert in healthy eating called a dietitian to learn more. What food should I avoid? Juices  that have pulp. Milk. Cream or cream-based soups. Yogurt. Solid foods that are not clear liquids or semi-liquids. The items listed above may not be all the foods and drinks you should avoid. Talk with a dietitian to learn more. Questions to ask your health care provider: How long do I need to follow this diet? Are there any medicines that I should change while on this diet? This information is not intended to replace advice given to you by your health care provider. Make sure you discuss any questions you have with your health care provider. Document Revised: 06/30/2023 Document Reviewed: 11/16/2022 Elsevier Patient Education  2024 [employer].

## 2024-09-21 NOTE — Assessment & Plan Note (Signed)
 Complicated by morbid obesity Controlled on telmisartan  HCT 80-25 mg daily Reinforced DASH diet and exercise for weight loss C-Met today

## 2024-09-21 NOTE — Assessment & Plan Note (Signed)
 Complicated by morbid obesity Avoid foods that trigger reflux Encourage weight loss as this can help reduce reflux symptoms Continue esomeprazole  40 mg daily Okay to continue ranitidine  OTC as needed for breakthrough

## 2024-09-21 NOTE — Assessment & Plan Note (Signed)
 Stable on citalopram  20 mg daily Support offered

## 2024-09-21 NOTE — Assessment & Plan Note (Signed)
 Continue adderall 10 mg twice daily Will monitor use

## 2024-09-21 NOTE — Assessment & Plan Note (Signed)
 Continue pancrelipase  36,000-114,000 units with meals and snacks Current flare- encouraged clear liquid diet Referral to GI for further evaluation at this time

## 2024-09-21 NOTE — Progress Notes (Signed)
 Subjective:    Patient ID: Gloria Carter, female    DOB: 1966/05/25, 58 y.o.   MRN: [id number]  HPI  Patient presents to clinic today for follow-up of chronic conditions.  ADD: She reports mainly inattention.  She is taking Adderall as prescribed.  She does not follow with psychiatry.  Asthma: Moderate, persistent.  She reports chronic cough and shortness of breath.  She is taking singulair , symbicort  and airsupra  as prescribed. She does not feel like the airsupra  works as well as albuterol . She does not follow with asthma and allergy or pulmonology.  There are no PFTs on file.  Fibromyalgia/chronic joint pain: Mainly in her neck, low back, hips and knees. She recently had injections in her knees by orthopedics which seemed to help. She is taking soma , pregabalin  and tramadol  as prescribed.  She does not follow with pain management.  HTN: Her BP today is 148/78.  She is taking telmisartan  HCT as prescribed. She does not routinely check her blood pressure at home. ECG from 08/2024 reviewed.  Hypothyroidism: She denies any issues on her current dose of levothyroxine .  She does not follow with endocrinology.  Chronic pancreatitis: Managed with pancrelipase . She reports a current flare of her pancreatitis.  She is not currently following GI.  GERD: She is not sure what triggers this, possibly spicy or greasy foods.  She has occassional breakthrough on esomeprazole  for with she takes ranitidine  with good relief of symptoms.  There is no upper GI on file.  Migraines: These occur every few months, although she has had more this month.  Triggered by smells, artificial sweeteners.  She is taking sumatriptan  as needed.  She does not follow with neurology.  Anxiety and depression (moderate, recurrent): Chronic, managed on citalopram .  She is not currently seeing a therapist.  She denies SI/HI.  Prediabetes: Her last A1c was 5.8%, 02/2024.  She is not taking any oral diabetic medications time.   She does not check her sugars.  HLD with aortic atherosclerosis: Her last LDL was 158, triglycerides 149, 02/2024.  She does not take any cholesterol-lowering medications time. She has taken cholesterol medication in the past, stopped because it made me feel funny.  She is taking aspirin .  She does not consume a low-fat diet.  Review of Systems     Past Medical History:  Diagnosis Date   ADD (attention deficit disorder)    Allergy    ENVIRONMENTAL   Arthritis    knees   Asthma    Depression    Difficulty sleeping    Dysphagia    Fibromyalgia    GERD (gastroesophageal reflux disease)    Hernia of abdominal wall    Hyperlipidemia    Hypertension    IBS (irritable bowel syndrome)    Migraine    Numbness and tingling in hands    Obesity    Swelling of both ankles    Thyroid  disease     Current Outpatient Medications  Medication Sig Dispense Refill   AIRSUPRA  90-80 MCG/ACT AERO INHALE 1 PUFF INTO THE LUNGS EVERY 4 HOURS AS NEEDED 10.7 g 2   amphetamine -dextroamphetamine  (ADDERALL) 10 MG tablet Take 1 tablet (10 mg total) by mouth 2 (two) times daily with a meal. 60 tablet 0   aspirin  EC 81 MG tablet Take 1 tablet (81 mg total) by mouth daily. Swallow whole. (Patient not taking: Reported on 02/10/2024) 30 tablet 12   b complex vitamins tablet Take 1 tablet by mouth daily.  Biotin 1000 MCG CHEW Chew 5,000 mcg by mouth.     budesonide -formoterol  (SYMBICORT ) 160-4.5 MCG/ACT inhaler Inhale 2 puffs into the lungs 2 (two) times daily. 1 each 2   Calcium-Magnesium -Zinc 167-83-8 MG TABS Take by mouth.     carisoprodol  (SOMA ) 350 MG tablet TAKE 1 TABLET BY MOUTH FOUR TIMES DAILY AS NEEDED FOR MUSCLE SPASMS 90 tablet 0   cetirizine (ZYRTEC) 10 MG tablet Take 10 mg by mouth daily as needed for allergies.      cholecalciferol (VITAMIN D ) 400 UNITS TABS Take 400 Units by mouth daily.     citalopram  (CELEXA ) 20 MG tablet TAKE 1 TABLET(20 MG) BY MOUTH DAILY 90 tablet 0   CREON  36000-114000  units CPEP capsule TAKE 1 CAPSULES BY MOUTH AS DIRECTED 4 CAPSULES WITH A MEAL AND 1 CAPSULE WITH SNACKS 400 capsule 5   esomeprazole  (NEXIUM ) 40 MG capsule TAKE 1 CAPSULE(40 MG) BY MOUTH DAILY 90 capsule 1   ipratropium (ATROVENT ) 0.03 % nasal spray Place 2 sprays into the nose 4 (four) times daily as needed for rhinitis. 30 mL 0   levothyroxine  (SYNTHROID ) 75 MCG tablet TAKE 1 TABLET BY MOUTH DAILY BEFORE BREAKFAST, ON AN EMPTY STOMACH WITH A SMALL GLASS OF WATER, NO MEDS OR FOOD FOR 20 MINUTES. 90 tablet 0   montelukast  (SINGULAIR ) 10 MG tablet TAKE 1 TABLET(10 MG) BY MOUTH EVERY MORNING 90 tablet 0   ondansetron  (ZOFRAN -ODT) 4 MG disintegrating tablet Take 1 tablet (4 mg total) by mouth every 8 (eight) hours as needed for nausea or vomiting. 12 tablet 0   oxyCODONE  (ROXICODONE ) 5 MG immediate release tablet Take 1 tablet (5 mg total) by mouth every 4 (four) hours as needed. 12 tablet 0   pregabalin  (LYRICA ) 150 MG capsule TAKE 1 CAPSULE(150 MG) BY MOUTH THREE TIMES DAILY 90 capsule 0   SUMAtriptan  (IMITREX ) 100 MG tablet TAKE 1 TAB BY MOUTH EVERY 2 HOURS AS NEEDED FOR MIGRAINE. NO MORE THAN 2 DOSES IN 24 HOURS. 8 tablet 5   telmisartan -hydrochlorothiazide  (MICARDIS  HCT) 80-25 MG tablet TAKE 1 TABLET BY MOUTH DAILY 90 tablet 1   traMADol  (ULTRAM ) 50 MG tablet TAKE 1 TABLET(50 MG) BY MOUTH THREE TIMES DAILY AS NEEDED 90 tablet 0   vitamin C (ASCORBIC ACID) 500 MG tablet Take 1,000 mg by mouth daily.     Vitamin D , Ergocalciferol , (DRISDOL ) 1.25 MG (50000 UNIT) CAPS capsule Take 1 capsule (50,000 Units total) by mouth once a week. For 12 weeks. Then start OTC Vitamin D3 2,000 unit daily. 12 capsule 0   vitamin E 400 UNIT capsule Take 400 Units by mouth daily.     No current facility-administered medications for this visit.    Allergies  Allergen Reactions   Soap     Any soaps with fragrance cause migraines   Sulfa Antibiotics Nausea And Vomiting    Family History  Problem Relation Age of  Onset   COPD Mother    Fibromyalgia Mother    Heart disease Father    Colon polyps Father    Stroke Sister    Fibromyalgia Sister    Colon polyps Maternal Grandmother    Lung disease Paternal Grandmother    Stroke Paternal Grandmother    Diabetes Paternal Grandfather    Heart disease Paternal Grandfather    Colon polyps Maternal Aunt    Aortic aneurysm Maternal Aunt    Cystic kidney disease Maternal Aunt        kidney cyst   Kidney disease Maternal  Aunt    Diabetes Maternal Uncle    Fibromyalgia Other        cousin   Colon cancer Neg Hx    Breast cancer Neg Hx     Social History   Socioeconomic History   Marital status: Divorced    Spouse name: Not on file   Number of children: 0   Years of education: Not on file   Highest education level: Not on file  Occupational History   Occupation: Child Psychotherapist    Employer: [employer]  Tobacco Use   Smoking status: Never   Smokeless tobacco: Never  Substance and Sexual Activity   Alcohol  use: No    Alcohol /week: 0.0 standard drinks of alcohol    Drug use: No   Sexual activity: Not on file  Other Topics Concern   Not on file  Social History Narrative   Married. Education: [employer].    Social Drivers of Health   Tobacco Use: Low Risk (08/17/2024)   Patient History    Smoking Tobacco Use: Never    Smokeless Tobacco Use: Never    Passive Exposure: Not on file  Financial Resource Strain: Not on file  Food Insecurity: Not on file  Transportation Needs: Not on file  Physical Activity: Not on file  Stress: Not on file  Social Connections: Not on file  Intimate Partner Violence: Not on file  Depression (PHQ2-9): Medium Risk (02/10/2024)   Depression (PHQ2-9)    PHQ-2 Score: 8  Alcohol  Screen: Low Risk (06/17/2022)   Alcohol  Screen    Last Alcohol  Screening Score (AUDIT): 0  Housing: Not on file  Utilities: Not on file  Health Literacy: Not on file     Constitutional: Patient reports chronic fatigue, intermittent  headaches.  Denies fever, malaise, or abrupt weight changes.  HEENT: Denies eye pain, eye redness, ear pain, ringing in the ears, wax buildup, runny nose, nasal congestion, bloody nose, or sore throat. Respiratory: Patient reports intermittent cough and shortness of breath.  Denies difficulty breathing, or sputum production.   Cardiovascular: Denies chest pain, chest tightness, palpitations or swelling in the hands or feet.  Gastrointestinal: Patient reports abdominal pain.  Denies  bloating, constipation, diarrhea or blood in the stool.  GU: Denies urgency, frequency, pain with urination, burning sensation, blood in urine, odor or discharge. Musculoskeletal: Patient reports chronic joint and muscle pain, difficulty with gait.  Denies decrease in range of motion, or joint swelling.  Skin: Denies redness, rashes, lesions or ulcercations.  Neurological: Patient reports inattention.  Denies dizziness, difficulty with memory, difficulty with speech or problems with balance and coordination.  Psych: Patient has a history of anxiety and depression.  Denies SI/HI.  No other specific complaints in a complete review of systems (except as listed in HPI above).  Objective:   Physical Exam  BP (!) 140/80   Ht 5' 2 (1.575 m)   Wt 271 lb 3.2 oz (123 kg)   BMI 49.60 kg/m    Wt Readings from Last 3 Encounters:  02/10/24 293 lb 12.8 oz (133.3 kg)  06/28/23 290 lb (131.5 kg)  06/06/23 294 lb (133.4 kg)    General: Appears her stated age, obese, in NAD. Skin: Warm, dry and intact.  Scattered scabbed areas noted of bilateral upper extremities. HEENT: Head: normal shape and size; Eyes: sclera white, no icterus, conjunctiva pink, PERRLA and EOMs intact;  Neck:  Neck supple, trachea midline. No masses, lumps present. Thyromegaly noted. Cardiovascular: Normal rate and rhythm. S1,S2 noted.  No  murmur, rubs or gallops noted. No JVD or BLE edema. No carotid bruits noted. Pulmonary/Chest: Normal effort and  positive vesicular breath sounds. No respiratory distress. No wheezes, rales or ronchi noted.  Abdomen: Soft and tender in the LUQ. Normal bowel sounds.  Musculoskeletal: Gait slow and steady with use of walker. Neurological: Alert and oriented. Cranial nerves II-XII grossly intact. Coordination normal.  Psychiatric: Mood and affect flat. Behavior is normal. Judgment and thought content normal.    BMET    Component Value Date/Time   NA 138 08/17/2024 1330   NA 142 01/05/2019 1601   K 3.5 08/17/2024 1330   CL 97 (L) 08/17/2024 1330   CO2 26 08/17/2024 1330   GLUCOSE 127 (H) 08/17/2024 1330   BUN 10 08/17/2024 1330   BUN 12 01/05/2019 1601   CREATININE 0.85 08/17/2024 1330   CREATININE 0.90 02/10/2024 1346   CALCIUM 10.3 08/17/2024 1330   GFRNONAA >60 08/17/2024 1330   GFRNONAA >89 08/23/2014 1707   GFRAA >60 03/28/2020 1052   GFRAA >89 08/23/2014 1707    Lipid Panel     Component Value Date/Time   CHOL 240 (H) 02/10/2024 1346   CHOL 221 (H) 01/05/2019 1601   TRIG 149 02/10/2024 1346   HDL 54 02/10/2024 1346   HDL 41 01/05/2019 1601   CHOLHDL 4.4 02/10/2024 1346   VLDL 33 08/31/2013 0918   LDLCALC 158 (H) 02/10/2024 1346    CBC    Component Value Date/Time   WBC 7.5 08/17/2024 1330   RBC 4.31 08/17/2024 1330   HGB 12.6 08/17/2024 1330   HGB 12.8 06/15/2017 1722   HCT 36.8 08/17/2024 1330   HCT 38.2 06/15/2017 1722   PLT 235 08/17/2024 1330   PLT 216 06/15/2017 1722   MCV 85.4 08/17/2024 1330   MCV 90.5 01/23/2019 1400   MCV 88 06/15/2017 1722   MCH 29.2 08/17/2024 1330   MCHC 34.2 08/17/2024 1330   RDW 12.7 08/17/2024 1330   RDW 13.9 06/15/2017 1722   LYMPHSABS 2.1 03/28/2020 1052   LYMPHSABS 2.1 06/15/2017 1722   MONOABS 0.9 03/28/2020 1052   EOSABS 0.3 03/28/2020 1052   EOSABS 0.2 06/15/2017 1722   BASOSABS 0.1 03/28/2020 1052   BASOSABS 0.0 06/15/2017 1722    Hgb A1C Lab Results  Component Value Date   HGBA1C 5.8 (H) 02/10/2024            Assessment & Plan:      RTC in 6 months, for your annual exam Angeline Laura, NP

## 2024-09-21 NOTE — Assessment & Plan Note (Signed)
 Encourage diet and exercise for weight loss

## 2024-09-21 NOTE — Assessment & Plan Note (Signed)
 Complicated by morbid obesity CMET and lipid profile today Encouraged him to consume a low fat diet. Discussed starting statin therapy if LDL >70.  Did discuss risk versus benefits, including heart attack, stroke and death Continue aspirin  81 mg daily

## 2024-09-21 NOTE — Assessment & Plan Note (Signed)
 Avoid triggers Continue sumatriptan  100 mg daily as needed

## 2024-09-21 NOTE — Assessment & Plan Note (Signed)
 Complicated by morbid obesity Encouraged regular stretching and core strengthening Continue carisoprodol  350 mg 3 times daily, pregabalin  150 mg 3 times daily and tramadol  50 mg 3 times daily

## 2024-09-21 NOTE — Assessment & Plan Note (Signed)
 TSH and free T4 today Continue levothyroxine  75 mcg daily, will adjust if needed based on labs

## 2024-09-21 NOTE — Assessment & Plan Note (Signed)
 Complicated by morbid obesity A1c today Encourage low-carb diet and exercise for weight loss

## 2024-09-21 NOTE — Assessment & Plan Note (Signed)
 Continue montelukast  10 mg daily, symbicort  160-4.5 mcg per actuation twice daily Will discontinue Airsupra  as she feels like it is not effective and will give Rx for albuterol  108 mcg per actuation every 4-6 hours as needed We will monitor

## 2024-09-21 NOTE — Assessment & Plan Note (Signed)
 Complicated by morbid obesity C-Met and lipid profile today Encouraged her to consume a low-fat diet, discussed risk versus benefit of statin therapy including heart attack, stroke and death Will discuss statin therapy if LDL >100

## 2024-09-22 LAB — CBC
HCT: 40.2 % (ref 35.9–46.0)
Hemoglobin: 13.4 g/dL (ref 11.7–15.5)
MCH: 28.6 pg (ref 27.0–33.0)
MCHC: 33.3 g/dL (ref 31.6–35.4)
MCV: 85.7 fL (ref 81.4–101.7)
MPV: 13.3 fL — ABNORMAL HIGH (ref 7.5–12.5)
Platelets: 246 Thousand/uL (ref 140–400)
RBC: 4.69 Million/uL (ref 3.80–5.10)
RDW: 14 % (ref 11.0–15.0)
WBC: 8.7 Thousand/uL (ref 3.8–10.8)

## 2024-09-22 LAB — LIPID PANEL
Cholesterol: 211 mg/dL — ABNORMAL HIGH (ref <200)
HDL: 50 mg/dL (ref >=50)
LDL Cholesterol (Calc): 132 mg/dL — ABNORMAL HIGH
Non-HDL Cholesterol (Calc): 161 mg/dL — ABNORMAL HIGH (ref <130)
Total CHOL/HDL Ratio: 4.2 (calc) (ref <5.0)
Triglycerides: 159 mg/dL — ABNORMAL HIGH (ref <150)

## 2024-09-22 LAB — COMPREHENSIVE METABOLIC PANEL WITH GFR
AG Ratio: 1.5 (calc) (ref 1.0–2.5)
ALT: 27 U/L (ref 6–29)
AST: 34 U/L (ref 10–35)
Albumin: 4.3 g/dL (ref 3.6–5.1)
Alkaline phosphatase (APISO): 69 U/L (ref 37–153)
BUN: 15 mg/dL (ref 7–25)
CO2: 32 mmol/L (ref 20–32)
Calcium: 9.8 mg/dL (ref 8.6–10.4)
Chloride: 92 mmol/L — ABNORMAL LOW (ref 98–110)
Creat: 0.99 mg/dL (ref 0.50–1.05)
Globulin: 2.8 g/dL (ref 1.9–3.7)
Glucose, Bld: 124 mg/dL — ABNORMAL HIGH (ref 65–99)
Potassium: 3 mmol/L — ABNORMAL LOW (ref 3.5–5.3)
Sodium: 135 mmol/L (ref 135–146)
Total Bilirubin: 0.3 mg/dL (ref 0.2–1.2)
Total Protein: 7.1 g/dL (ref 6.1–8.1)
eGFR: 65 mL/min/1.73m2 (ref >=60)

## 2024-09-22 LAB — T4, FREE: Free T4: 1.4 ng/dL (ref 0.8–1.8)

## 2024-09-22 LAB — HEMOGLOBIN A1C
Hgb A1c MFr Bld: 5.8 % — ABNORMAL HIGH (ref <5.7)
Mean Plasma Glucose: 120 mg/dL
eAG (mmol/L): 6.6 mmol/L

## 2024-09-22 LAB — TSH: TSH: 0.94 m[IU]/L (ref 0.40–4.50)

## 2024-09-24 ENCOUNTER — Ambulatory Visit: Payer: Self-pay | Admitting: Internal Medicine

## 2024-09-30 ENCOUNTER — Other Ambulatory Visit: Payer: Self-pay | Admitting: Internal Medicine

## 2024-09-30 DIAGNOSIS — I10 Essential (primary) hypertension: Secondary | ICD-10-CM

## 2024-10-01 ENCOUNTER — Telehealth: Payer: Self-pay

## 2024-10-01 NOTE — Telephone Encounter (Signed)
 Copied from CRM [id number]. Topic: Clinical - Medical Advice >> Oct 01, 2024  9:57 AM Tinnie C wrote: Reason for CRM: Pt was told to go on clear liquid diet, states she has been on it for a week and a half. Wants to know how long she is supposed to be on it. Please give her a call back at [phone]

## 2024-10-01 NOTE — Telephone Encounter (Signed)
 Patient advised and verbalized understanding. States symptoms have improved.

## 2024-10-01 NOTE — Telephone Encounter (Signed)
 If her symptoms have improved, she can progress to a bland diet

## 2024-10-03 NOTE — Telephone Encounter (Signed)
 Requested Prescriptions  Pending Prescriptions Disp Refills   telmisartan -hydrochlorothiazide  (MICARDIS  HCT) 80-25 MG tablet [Pharmacy Med Name: TELMISARTAN /HCTZ 80-25MG  TABS] 90 tablet 1    Sig: TAKE 1 TABLET BY MOUTH DAILY     Cardiovascular: ARB + Diuretic Combos Failed - 10/03/2024 10:22 AM      Failed - K in normal range and within 180 days    Potassium  Date Value Ref Range Status  09/21/2024 3.0 (L) 3.5 - 5.3 mmol/L Final         Failed - Last BP in normal range    BP Readings from Last 1 Encounters:  09/21/24 (!) 140/80         Passed - Na in normal range and within 180 days    Sodium  Date Value Ref Range Status  09/21/2024 135 135 - 146 mmol/L Final  01/05/2019 142 134 - 144 mmol/L Final         Passed - Cr in normal range and within 180 days    Creat  Date Value Ref Range Status  09/21/2024 0.99 0.50 - 1.05 mg/dL Final         Passed - eGFR is 10 or above and within 180 days    GFR, Est African American  Date Value Ref Range Status  08/23/2014 >89 mL/min Final   GFR calc Af Amer  Date Value Ref Range Status  03/28/2020 >60 >60 mL/min Final   GFR, Est Non African American  Date Value Ref Range Status  08/23/2014 >89 mL/min Final    Comment:      The estimated GFR is a calculation valid for adults (>=31 years old) that uses the CKD-EPI algorithm to adjust for age and sex. It is   not to be used for children, pregnant women, hospitalized patients,    patients on dialysis, or with rapidly changing kidney function. According to the NKDEP, eGFR >89 is normal, 60-89 shows mild impairment, 30-59 shows moderate impairment, 15-29 shows severe impairment and <15 is ESRD.      GFR, Estimated  Date Value Ref Range Status  08/17/2024 >60 >60 mL/min Final    Comment:    (NOTE) Calculated using the CKD-EPI Creatinine Equation (2021)    GFR  Date Value Ref Range Status  01/05/2022 85.26 >60.00 mL/min Final    Comment:    Calculated using the CKD-EPI  Creatinine Equation (2021)   eGFR  Date Value Ref Range Status  09/21/2024 65 > OR = 60 mL/min/1.92m2 Final         Passed - Patient is not pregnant      Passed - Valid encounter within last 6 months    Recent Outpatient Visits           1 week ago Idiopathic chronic pancreatitis [facility])   [facility], Angeline ORN, NP   7 months ago Encounter for general adult medical examination with abnormal findings    [facility] [place], Angeline ORN, NP   5 years ago Cough   [facility], Santana LABOR, MD   5 years ago Essential hypertension   [facility] at [facility], Santana LABOR, MD   5 years ago Attention deficit hyperactivity disorder (ADHD), combined type   [facility] at [facility], Zoe A, MD

## 2024-10-23 ENCOUNTER — Other Ambulatory Visit: Payer: Self-pay | Admitting: Internal Medicine

## 2024-10-23 DIAGNOSIS — G894 Chronic pain syndrome: Secondary | ICD-10-CM

## 2024-10-23 DIAGNOSIS — M797 Fibromyalgia: Secondary | ICD-10-CM

## 2024-10-25 NOTE — Telephone Encounter (Signed)
 Requested medication (s) are due for refill today - yes  Requested medication (s) are on the active medication list -yes  Future visit scheduled -yes  Last refill: Ondansetron -09/21/24 #90                  Carisoprodol - 09/21/24 #90                   Tramadol - 09/21/24 #90                   Pregabalin - 09/21/24 #90  Notes to clinic: non delegated Rx  Requested Prescriptions  Pending Prescriptions Disp Refills   ondansetron  (ZOFRAN -ODT) 8 MG disintegrating tablet [Pharmacy Med Name: ONDANSETRON  ODT 8MG  TABLETS] 30 tablet 0    Sig: TAKE 1 TABLET BY MOUTH EVERY 8 HOURS AS NEEDED FOR NAUSEA     Not Delegated - Gastroenterology: Antiemetics - ondansetron  Failed - 10/25/2024 10:07 AM      Failed - This refill cannot be delegated      Passed - AST in normal range and within 360 days    AST  Date Value Ref Range Status  09/21/2024 34 10 - 35 U/L Final         Passed - ALT in normal range and within 360 days    ALT  Date Value Ref Range Status  09/21/2024 27 6 - 29 U/L Final         Passed - Valid encounter within last 6 months    Recent Outpatient Visits           1 month ago Idiopathic chronic pancreatitis [facility])   [place] [facility] [place], Angeline ORN, NP   8 months ago Encounter for general adult medical examination with abnormal findings   [facility] [place], Angeline ORN, NP   5 years ago Cough   Primary [facility] at [facility], Santana LABOR, MD   5 years ago Essential hypertension   Primary Care at [facility], [place], MD   5 years ago Attention deficit hyperactivity disorder (ADHD), combined type   [facility] at [facility], Santana LABOR, MD               carisoprodol  (SOMA ) 350 MG tablet [Pharmacy Med Name: CARISOPRODOL  350MG  TABLETS] 90 tablet     Sig: TAKE 1 TABLET BY MOUTH FOUR TIMES DAILY AS NEEDED FOR MUSCLE SPASMS     Not Delegated - Analgesics:  Muscle Relaxants Failed - 10/25/2024 10:07 AM      Failed - This  refill cannot be delegated      Passed - Valid encounter within last 6 months    Recent Outpatient Visits           1 month ago Idiopathic chronic pancreatitis [facility])    [facility], Angeline ORN, NP   8 months ago Encounter for general adult medical examination with abnormal findings    [facility] [place], Angeline ORN, NP   5 years ago Cough   [facility] at [facility], Santana LABOR, MD   5 years ago Essential hypertension   Primary Care at [facility], [place], MD   5 years ago Attention deficit hyperactivity disorder (ADHD), combined type   Primary Care at [facility], Zoe A, MD               traMADol  (ULTRAM ) 50 MG tablet [Pharmacy Med Name: TRAMADOL  50MG  TABLETS] 90  tablet     Sig: TAKE 1 TABLET(50 MG) BY MOUTH THREE TIMES DAILY AS NEEDED     Not Delegated - Analgesics:  Opioid Agonists Failed - 10/25/2024 10:07 AM      Failed - This refill cannot be delegated      Failed - Urine Drug Screen completed in last 360 days      Passed - Valid encounter within last 3 months    Recent Outpatient Visits           1 month ago Idiopathic chronic pancreatitis [facility])   [place] [facility] [place], Angeline ORN, NP   8 months ago Encounter for general adult medical examination with abnormal findings   [facility] [place], Angeline ORN, NP   5 years ago Cough   [facility] at [facility], Santana LABOR, MD   5 years ago Essential hypertension   Primary Care at [facility], [place], MD   5 years ago Attention deficit hyperactivity disorder (ADHD), combined type   Primary Care at [facility], [place] A, MD               pregabalin  (LYRICA ) 150 MG capsule [Pharmacy Med Name: PREGABALIN  150MG  CAPSULES] 90 capsule     Sig: TAKE 1 CAPSULE(150 MG) BY MOUTH THREE TIMES DAILY     Not Delegated - Neurology:  Anticonvulsants - Controlled - pregabalin  Failed - 10/25/2024 10:07 AM       Failed - This refill cannot be delegated      Passed - Cr in normal range and within 360 days    Creat  Date Value Ref Range Status  09/21/2024 0.99 0.50 - 1.05 mg/dL Final         Passed - Completed PHQ-2 or PHQ-9 in the last 360 days      Passed - Valid encounter within last 12 months    Recent Outpatient Visits           1 month ago Idiopathic chronic pancreatitis [facility])   [place] [facility] [place], Angeline ORN, NP   8 months ago Encounter for general adult medical examination with abnormal findings   [place] [facility] [place], Angeline ORN, NP   5 years ago Cough   Primary [facility] at [facility], Santana LABOR, MD   5 years ago Essential hypertension   Primary [facility] at [facility], [place] A, MD   5 years ago Attention deficit hyperactivity disorder (ADHD), combined type   Primary [facility] at [facility], Santana LABOR, MD              Signed Prescriptions Disp Refills   albuterol  (VENTOLIN  HFA) 108 (90 Base) MCG/ACT inhaler 8.5 g 4    Sig: INHALE 2 PUFFS INTO THE LUNGS EVERY 6 HOURS AS NEEDED FOR WHEEZING     Pulmonology:  Beta Agonists 2 Failed - 10/25/2024 10:07 AM      Failed - Last BP in normal range    BP Readings from Last 1 Encounters:  09/21/24 (!) 140/80         Passed - Last Heart Rate in normal range    Pulse Readings from Last 1 Encounters:  08/17/24 95         Passed - Valid encounter within last 12 months    Recent Outpatient Visits           1 month ago Idiopathic chronic pancreatitis (HCC)   [place]  [facility] [place], Angeline ORN, NP   8 months ago Encounter for general adult medical examination with abnormal findings   [place] [facility] [place], Angeline ORN, NP   5 years ago Cough   Primary [facility] at [facility], [place] A, MD   5 years ago Essential hypertension   [facility] at [facility], [place] A, MD   5 years ago Attention deficit hyperactivity disorder (ADHD), combined type    Primary [facility] at [facility], Santana LABOR, MD                 Requested Prescriptions  Pending Prescriptions Disp Refills   ondansetron  (ZOFRAN -ODT) 8 MG disintegrating tablet [Pharmacy Med Name: ONDANSETRON  ODT 8MG  TABLETS] 30 tablet 0    Sig: TAKE 1 TABLET BY MOUTH EVERY 8 HOURS AS NEEDED FOR NAUSEA     Not Delegated - Gastroenterology: Antiemetics - ondansetron  Failed - 10/25/2024 10:07 AM      Failed - This refill cannot be delegated      Passed - AST in normal range and within 360 days    AST  Date Value Ref Range Status  09/21/2024 34 10 - 35 U/L Final         Passed - ALT in normal range and within 360 days    ALT  Date Value Ref Range Status  09/21/2024 27 6 - 29 U/L Final         Passed - Valid encounter within last 6 months    Recent Outpatient Visits           1 month ago Idiopathic chronic pancreatitis [facility])   [place] [facility] [place], Angeline ORN, NP   8 months ago Encounter for general adult medical examination with abnormal findings   [place] [facility] [place], Angeline ORN, NP   5 years ago Cough   [facility] at [facility] [place], Santana LABOR, MD   5 years ago Essential hypertension   Primary Care at [facility], [place], MD   5 years ago Attention deficit hyperactivity disorder (ADHD), combined type   [facility] at [facility], Santana LABOR, MD               carisoprodol  (SOMA ) 350 MG tablet [Pharmacy Med Name: CARISOPRODOL  350MG  TABLETS] 90 tablet     Sig: TAKE 1 TABLET BY MOUTH FOUR TIMES DAILY AS NEEDED FOR MUSCLE SPASMS     Not Delegated - Analgesics:  Muscle Relaxants Failed - 10/25/2024 10:07 AM      Failed - This refill cannot be delegated      Passed - Valid encounter within last 6 months    Recent Outpatient Visits           1 month ago Idiopathic chronic pancreatitis [facility])   [place] [facility], Angeline ORN, NP   8 months ago Encounter for general adult medical examination  with abnormal findings   [place] [facility] [place], Angeline ORN, NP   5 years ago Cough   Primary Care at [facility], Santana LABOR, MD   5 years ago Essential hypertension   Primary [facility] at [facility], Santana LABOR, MD   5 years ago Attention deficit hyperactivity disorder (ADHD), combined type   [facility] at [facility], Zoe A, MD               traMADol  (ULTRAM ) 50 MG tablet [Pharmacy Med  Name: TRAMADOL  50MG  TABLETS] 90 tablet     Sig: TAKE 1 TABLET(50 MG) BY MOUTH THREE TIMES DAILY AS NEEDED     Not Delegated - Analgesics:  Opioid Agonists Failed - 10/25/2024 10:07 AM      Failed - This refill cannot be delegated      Failed - Urine Drug Screen completed in last 360 days      Passed - Valid encounter within last 3 months    Recent Outpatient Visits           1 month ago Idiopathic chronic pancreatitis [facility])   [place] [facility], Angeline ORN, NP   8 months ago Encounter for general adult medical examination with abnormal findings   [place] [facility] [place], Angeline ORN, NP   5 years ago Cough   Primary Care at [facility], Santana LABOR, MD   5 years ago Essential hypertension   [facility] at [facility], Santana LABOR, MD   5 years ago Attention deficit hyperactivity disorder (ADHD), combined type   Primary [facility] at [facility], [place] A, MD               pregabalin  (LYRICA ) 150 MG capsule [Pharmacy Med Name: PREGABALIN  150MG  CAPSULES] 90 capsule     Sig: TAKE 1 CAPSULE(150 MG) BY MOUTH THREE TIMES DAILY     Not Delegated - Neurology:  Anticonvulsants - Controlled - pregabalin  Failed - 10/25/2024 10:07 AM      Failed - This refill cannot be delegated      Passed - Cr in normal range and within 360 days    Creat  Date Value Ref Range Status  09/21/2024 0.99 0.50 - 1.05 mg/dL Final         Passed - Completed PHQ-2 or PHQ-9 in the last 360 days      Passed - Valid encounter within last 12 months     Recent Outpatient Visits           1 month ago Idiopathic chronic pancreatitis [facility])   [place] [facility] [place], Angeline ORN, NP   8 months ago Encounter for general adult medical examination with abnormal findings    [facility], Angeline ORN, NP   5 years ago Cough   Primary [facility] at [facility], Santana LABOR, MD   5 years ago Essential hypertension   Primary Care at [facility], [place] A, MD   5 years ago Attention deficit hyperactivity disorder (ADHD), combined type   Primary [facility] at [facility], Santana LABOR, MD              Signed Prescriptions Disp Refills   albuterol  (VENTOLIN  HFA) 108 (90 Base) MCG/ACT inhaler 8.5 g 4    Sig: INHALE 2 PUFFS INTO THE LUNGS EVERY 6 HOURS AS NEEDED FOR WHEEZING     Pulmonology:  Beta Agonists 2 Failed - 10/25/2024 10:07 AM      Failed - Last BP in normal range    BP Readings from Last 1 Encounters:  09/21/24 (!) 140/80         Passed - Last Heart Rate in normal range    Pulse Readings from Last 1 Encounters:  08/17/24 95         Passed - Valid encounter within last 12 months    Recent Outpatient Visits           1 month ago Idiopathic chronic pancreatitis (  [facility])   [place] [facility] [place], Angeline ORN, NP   8 months ago Encounter for general adult medical examination with abnormal findings   [place] [facility] [place], Angeline ORN, NP   5 years ago Cough   [facility] at [facility], Santana LABOR, MD   5 years ago Essential hypertension   Primary [facility] at [facility], Santana LABOR, MD   5 years ago Attention deficit hyperactivity disorder (ADHD), combined type   Primary Care at [facility], Zoe A, MD

## 2024-10-25 NOTE — Telephone Encounter (Signed)
 Requested medication (s) are due for refill today - expired Rx    Requested medication (s) are on the active medication list -yes  Future visit scheduled -yes  Last refill: 09/07/23 #400 5RF  Notes to clinic: off protocol- provider review   Requested Prescriptions  Pending Prescriptions Disp Refills   CREON  36000-114000 units CPEP capsule [Pharmacy Med Name: CREON  DR 36,000 UNIT CAPSULE] 400 capsule 5    Sig: TAKE 1 CAPSULES BY MOUTH AS DIRECTED 4 CAPSULES WITH A MEAL AND 1 CAPSULE WITH SNACKS     Off-Protocol Failed - 10/25/2024  9:53 AM      Failed - Medication not assigned to a protocol, review manually.      Passed - Valid encounter within last 12 months    Recent Outpatient Visits           1 month ago Idiopathic chronic pancreatitis [facility])   [place] [facility] [place], Angeline ORN, NP   8 months ago Encounter for general adult medical examination with abnormal findings   [facility] [place], Angeline ORN, NP   5 years ago Cough   [facility] at [facility], Santana LABOR, MD   5 years ago Essential hypertension   Primary Care at [facility], Santana LABOR, MD   5 years ago Attention deficit hyperactivity disorder (ADHD), combined type   Primary Care at [facility], Zoe A, MD                 Requested Prescriptions  Pending Prescriptions Disp Refills   CREON  36000-114000 units CPEP capsule [Pharmacy Med Name: CREON  DR 36,000 UNIT CAPSULE] 400 capsule 5    Sig: TAKE 1 CAPSULES BY MOUTH AS DIRECTED 4 CAPSULES WITH A MEAL AND 1 CAPSULE WITH SNACKS     Off-Protocol Failed - 10/25/2024  9:53 AM      Failed - Medication not assigned to a protocol, review manually.      Passed - Valid encounter within last 12 months    Recent Outpatient Visits           1 month ago Idiopathic chronic pancreatitis [facility])   [place] [facility] [place], Angeline ORN, NP   8 months ago Encounter for general adult medical examination  with abnormal findings   [place] [facility] [place], Angeline ORN, NP   5 years ago Cough   Primary Care at [facility], Santana LABOR, MD   5 years ago Essential hypertension   Primary [facility] at [facility], Santana LABOR, MD   5 years ago Attention deficit hyperactivity disorder (ADHD), combined type   Primary Care at [facility], Zoe A, MD

## 2024-10-25 NOTE — Telephone Encounter (Signed)
 Requested Prescriptions  Pending Prescriptions Disp Refills   albuterol  (VENTOLIN  HFA) 108 (90 Base) MCG/ACT inhaler [Pharmacy Med Name: ALBUTEROL  HFA INH (200 PUFFS) 8.5GM] 8.5 g 4    Sig: INHALE 2 PUFFS INTO THE LUNGS EVERY 6 HOURS AS NEEDED FOR WHEEZING     Pulmonology:  Beta Agonists 2 Failed - 10/25/2024 10:06 AM      Failed - Last BP in normal range    BP Readings from Last 1 Encounters:  09/21/24 (!) 140/80         Passed - Last Heart Rate in normal range    Pulse Readings from Last 1 Encounters:  08/17/24 95         Passed - Valid encounter within last 12 months    Recent Outpatient Visits           1 month ago Idiopathic chronic pancreatitis [facility])   [place] [facility] [place], Angeline ORN, NP   8 months ago Encounter for general adult medical examination with abnormal findings   [place] [facility] [place], [place] W, NP   5 years ago Cough   Primary [facility] at [facility], [place] A, MD   5 years ago Essential hypertension   Primary Care at [facility], [place] A, MD   5 years ago Attention deficit hyperactivity disorder (ADHD), combined type   Primary Care at [place] [facility], [place] A, MD               ondansetron  (ZOFRAN -ODT) 8 MG disintegrating tablet [Pharmacy Med Name: ONDANSETRON  ODT 8MG  TABLETS] 30 tablet 0    Sig: TAKE 1 TABLET BY MOUTH EVERY 8 HOURS AS NEEDED FOR NAUSEA     Not Delegated - Gastroenterology: Antiemetics - ondansetron  Failed - 10/25/2024 10:06 AM      Failed - This refill cannot be delegated      Passed - AST in normal range and within 360 days    AST  Date Value Ref Range Status  09/21/2024 34 10 - 35 U/L Final         Passed - ALT in normal range and within 360 days    ALT  Date Value Ref Range Status  09/21/2024 27 6 - 29 U/L Final         Passed - Valid encounter within last 6 months    Recent Outpatient Visits           1 month ago Idiopathic chronic pancreatitis [facility])   [place] [facility] [place], Angeline ORN, NP   8 months ago Encounter for general adult medical examination with abnormal findings    [facility] [place], Angeline ORN, NP   5 years ago Cough   Primary [facility] at [facility], Santana LABOR, MD   5 years ago Essential hypertension   [facility] at [facility], [place], MD   5 years ago Attention deficit hyperactivity disorder (ADHD), combined type   Primary [facility] at [facility], Zoe A, MD               carisoprodol  (SOMA ) 350 MG tablet [Pharmacy Med Name: CARISOPRODOL  350MG  TABLETS] 90 tablet     Sig: TAKE 1 TABLET BY MOUTH FOUR TIMES DAILY AS NEEDED FOR MUSCLE SPASMS     Not Delegated - Analgesics:  Muscle Relaxants Failed - 10/25/2024 10:06 AM      Failed - This refill cannot be delegated      Passed -  Valid encounter within last 6 months    Recent Outpatient Visits           1 month ago Idiopathic chronic pancreatitis [facility])   [place] [facility] [place], Angeline ORN, NP   8 months ago Encounter for general adult medical examination with abnormal findings   [place] [facility] [place], Angeline ORN, NP   5 years ago Cough   Primary Care at [facility], Santana LABOR, MD   5 years ago Essential hypertension   Primary [facility] at [facility], Santana LABOR, MD   5 years ago Attention deficit hyperactivity disorder (ADHD), combined type   Primary Care at [facility], [place] A, MD               traMADol  (ULTRAM ) 50 MG tablet [Pharmacy Med Name: TRAMADOL  50MG  TABLETS] 90 tablet     Sig: TAKE 1 TABLET(50 MG) BY MOUTH THREE TIMES DAILY AS NEEDED     Not Delegated - Analgesics:  Opioid Agonists Failed - 10/25/2024 10:06 AM      Failed - This refill cannot be delegated      Failed - Urine Drug Screen completed in last 360 days      Passed - Valid encounter within last 3 months    Recent Outpatient Visits           1 month ago Idiopathic chronic pancreatitis [facility])   [place]  [facility] [place], Angeline ORN, NP   8 months ago Encounter for general adult medical examination with abnormal findings   [place] [facility], Angeline ORN, NP   5 years ago Cough   [facility] at [facility], Santana LABOR, MD   5 years ago Essential hypertension   Primary [facility] at [facility], Santana LABOR, MD   5 years ago Attention deficit hyperactivity disorder (ADHD), combined type   [facility] at [facility], [place] A, MD               pregabalin  (LYRICA ) 150 MG capsule [Pharmacy Med Name: PREGABALIN  150MG  CAPSULES] 90 capsule     Sig: TAKE 1 CAPSULE(150 MG) BY MOUTH THREE TIMES DAILY     Not Delegated - Neurology:  Anticonvulsants - Controlled - pregabalin  Failed - 10/25/2024 10:06 AM      Failed - This refill cannot be delegated      Passed - Cr in normal range and within 360 days    Creat  Date Value Ref Range Status  09/21/2024 0.99 0.50 - 1.05 mg/dL Final         Passed - Completed PHQ-2 or PHQ-9 in the last 360 days      Passed - Valid encounter within last 12 months    Recent Outpatient Visits           1 month ago [facility]iopathic [facility])   [place] [facility] [place], Angeline ORN, NP   8 months ago Encounter for general adult medical examination with abnormal findings   [place] [facility] [place], Angeline ORN, NP   5 years ago Cough   [facility] at [facility], Santana LABOR, MD   5 years ago Essential hypertension   Primary Care at [facility], Santana LABOR, MD   5 years ago Attention deficit hyperactivity disorder (ADHD), combined type   Primary Care at [facility], Santana LABOR, MD

## 2024-11-01 ENCOUNTER — Other Ambulatory Visit: Payer: Self-pay | Admitting: Internal Medicine

## 2024-11-01 DIAGNOSIS — I10 Essential (primary) hypertension: Secondary | ICD-10-CM

## 2024-11-01 NOTE — Telephone Encounter (Signed)
 Requested Prescriptions  Refused Prescriptions Disp Refills   telmisartan -hydrochlorothiazide  (MICARDIS  HCT) 80-25 MG tablet [Pharmacy Med Name: TELMISARTAN /HCTZ 80-25MG  TABS] 90 tablet 1    Sig: TAKE 1 TABLET BY MOUTH DAILY     Cardiovascular: ARB + Diuretic Combos Failed - 11/01/2024  2:33 PM      Failed - K in normal range and within 180 days    Potassium  Date Value Ref Range Status  09/21/2024 3.0 (L) 3.5 - 5.3 mmol/L Final         Failed - Last BP in normal range    BP Readings from Last 1 Encounters:  09/21/24 (!) 140/80         Passed - Na in normal range and within 180 days    Sodium  Date Value Ref Range Status  09/21/2024 135 135 - 146 mmol/L Final  01/05/2019 142 134 - 144 mmol/L Final         Passed - Cr in normal range and within 180 days    Creat  Date Value Ref Range Status  09/21/2024 0.99 0.50 - 1.05 mg/dL Final         Passed - eGFR is 10 or above and within 180 days    GFR, Est African American  Date Value Ref Range Status  08/23/2014 >89 mL/min Final   GFR calc Af Amer  Date Value Ref Range Status  03/28/2020 >60 >60 mL/min Final   GFR, Est Non African American  Date Value Ref Range Status  08/23/2014 >89 mL/min Final    Comment:      The estimated GFR is a calculation valid for adults (>=36 years old) that uses the CKD-EPI algorithm to adjust for age and sex. It is   not to be used for children, pregnant women, hospitalized patients,    patients on dialysis, or with rapidly changing kidney function. According to the NKDEP, eGFR >89 is normal, 60-89 shows mild impairment, 30-59 shows moderate impairment, 15-29 shows severe impairment and <15 is ESRD.      GFR, Estimated  Date Value Ref Range Status  08/17/2024 >60 >60 mL/min Final    Comment:    (NOTE) Calculated using the CKD-EPI Creatinine Equation (2021)    GFR  Date Value Ref Range Status  01/05/2022 85.26 >60.00 mL/min Final    Comment:    Calculated using the CKD-EPI  Creatinine Equation (2021)   eGFR  Date Value Ref Range Status  09/21/2024 65 > OR = 60 mL/min/1.69m2 Final         Passed - Patient is not pregnant      Passed - Valid encounter within last 6 months    Recent Outpatient Visits           1 month ago Idiopathic chronic pancreatitis [facility])   [place] [facility] [place], Angeline ORN, NP   8 months ago Encounter for general adult medical examination with abnormal findings   [place] [facility] [place], Angeline ORN, NP   5 years ago Cough   Primary Care at [facility], Santana LABOR, MD   5 years ago Essential hypertension   [facility] at [facility], Santana LABOR, MD   5 years ago Attention deficit hyperactivity disorder (ADHD), combined type   Primary Care at [facility], Zoe A, MD

## 2025-02-11 ENCOUNTER — Encounter: Admitting: Internal Medicine
